# Patient Record
Sex: Male | Born: 1970 | Race: White | Hispanic: No | Marital: Married | Smoking: Current every day smoker
Health system: Southern US, Community
[De-identification: ages and names within clinical notes are randomized; demographics above are authoritative.]

## PROBLEM LIST (undated history)

## (undated) DIAGNOSIS — F419 Anxiety disorder, unspecified: Secondary | ICD-10-CM

## (undated) DIAGNOSIS — J439 Emphysema, unspecified: Secondary | ICD-10-CM

## (undated) DIAGNOSIS — I700 Atherosclerosis of aorta: Secondary | ICD-10-CM

## (undated) DIAGNOSIS — Z8616 Personal history of COVID-19: Secondary | ICD-10-CM

## (undated) DIAGNOSIS — J42 Unspecified chronic bronchitis: Secondary | ICD-10-CM

## (undated) DIAGNOSIS — J302 Other seasonal allergic rhinitis: Secondary | ICD-10-CM

## (undated) DIAGNOSIS — F32A Depression, unspecified: Secondary | ICD-10-CM

## (undated) DIAGNOSIS — G4700 Insomnia, unspecified: Secondary | ICD-10-CM

## (undated) DIAGNOSIS — E871 Hypo-osmolality and hyponatremia: Secondary | ICD-10-CM

## (undated) DIAGNOSIS — J189 Pneumonia, unspecified organism: Secondary | ICD-10-CM

## (undated) DIAGNOSIS — R7989 Other specified abnormal findings of blood chemistry: Secondary | ICD-10-CM

## (undated) DIAGNOSIS — F10931 Alcohol use, unspecified with withdrawal delirium: Secondary | ICD-10-CM

## (undated) DIAGNOSIS — D649 Anemia, unspecified: Secondary | ICD-10-CM

## (undated) DIAGNOSIS — R569 Unspecified convulsions: Secondary | ICD-10-CM

## (undated) DIAGNOSIS — F17200 Nicotine dependence, unspecified, uncomplicated: Secondary | ICD-10-CM

## (undated) DIAGNOSIS — F10231 Alcohol dependence with withdrawal delirium: Secondary | ICD-10-CM

## (undated) DIAGNOSIS — R030 Elevated blood-pressure reading, without diagnosis of hypertension: Secondary | ICD-10-CM

## (undated) DIAGNOSIS — F1010 Alcohol abuse, uncomplicated: Secondary | ICD-10-CM

## (undated) DIAGNOSIS — F329 Major depressive disorder, single episode, unspecified: Secondary | ICD-10-CM

## (undated) HISTORY — PX: OTHER SURGICAL HISTORY: SHX169

## (undated) HISTORY — DX: Atherosclerosis of aorta: I70.0

## (undated) HISTORY — DX: Other seasonal allergic rhinitis: J30.2

## (undated) HISTORY — DX: Anemia, unspecified: D64.9

## (undated) HISTORY — DX: Emphysema, unspecified: J43.9

## (undated) HISTORY — DX: Nicotine dependence, unspecified, uncomplicated: F17.200

## (undated) HISTORY — DX: Insomnia, unspecified: G47.00

## (undated) HISTORY — DX: Personal history of COVID-19: Z86.16

## (undated) HISTORY — DX: Unspecified chronic bronchitis: J42

## (undated) HISTORY — DX: Elevated blood-pressure reading, without diagnosis of hypertension: R03.0

## (undated) HISTORY — DX: Other specified abnormal findings of blood chemistry: R79.89

## (undated) HISTORY — DX: Hypo-osmolality and hyponatremia: E87.1

## (undated) HISTORY — PX: MOUTH SURGERY: SHX715

---

## 2012-10-31 ENCOUNTER — Emergency Department (HOSPITAL_COMMUNITY)
Admission: EM | Admit: 2012-10-31 | Discharge: 2012-11-01 | Disposition: A | Discharge status: Home / Self Care | Payer: Medicaid Other | Attending: Emergency Medicine | Admitting: Emergency Medicine

## 2012-10-31 ENCOUNTER — Encounter (HOSPITAL_COMMUNITY): Payer: Self-pay | Admitting: Emergency Medicine

## 2012-10-31 DIAGNOSIS — R000 Tachycardia, unspecified: Secondary | ICD-10-CM | POA: Insufficient documentation

## 2012-10-31 DIAGNOSIS — F1010 Alcohol abuse, uncomplicated: Secondary | ICD-10-CM | POA: Insufficient documentation

## 2012-10-31 DIAGNOSIS — F17200 Nicotine dependence, unspecified, uncomplicated: Secondary | ICD-10-CM | POA: Insufficient documentation

## 2012-10-31 HISTORY — DX: Alcohol abuse, uncomplicated: F10.10

## 2012-10-31 LAB — COMPREHENSIVE METABOLIC PANEL
ALT: 25 U/L (ref 0–53)
AST: 32 U/L (ref 0–37)
Albumin: 3.6 g/dL (ref 3.5–5.2)
CO2: 22 mEq/L (ref 19–32)
Chloride: 99 mEq/L (ref 96–112)
Creatinine, Ser: 0.75 mg/dL (ref 0.50–1.35)
GFR calc non Af Amer: >90 mL/min (ref >90)
Potassium: 3.6 mEq/L (ref 3.5–5.1)
Sodium: 141 mEq/L (ref 135–145)
Total Bilirubin: 0.3 mg/dL (ref 0.3–1.2)
Total Protein: 7.2 g/dL (ref 6.0–8.3)

## 2012-10-31 LAB — CBC
MCV: 100 fL (ref 78.0–100.0)
Platelets: 238 10*3/uL (ref 150–400)
RBC: 4.55 MIL/uL (ref 4.22–5.81)
RDW: 12.6 % (ref 11.5–15.5)
WBC: 19.2 10*3/uL — ABNORMAL HIGH (ref 4.0–10.5)

## 2012-10-31 LAB — RAPID URINE DRUG SCREEN, HOSP PERFORMED
Amphetamines: NOT DETECTED
Benzodiazepines: NOT DETECTED
Tetrahydrocannabinol: NOT DETECTED

## 2012-10-31 LAB — ACETAMINOPHEN LEVEL: Acetaminophen (Tylenol), Serum: <15 ug/mL (ref 10–30)

## 2012-10-31 LAB — SALICYLATE LEVEL: Salicylate Lvl: <2 mg/dL — ABNORMAL LOW (ref 2.8–20.0)

## 2012-10-31 MED ORDER — VITAMIN B-1 100 MG PO TABS
100.0000 mg | ORAL_TABLET | Freq: Every day | ORAL | Status: DC
  Filled 2012-10-31: qty 1

## 2012-10-31 MED ORDER — FOLIC ACID 1 MG PO TABS
1.0000 mg | ORAL_TABLET | Freq: Every day | ORAL | Status: DC
  Administered 2012-10-31: 1 mg via ORAL
  Filled 2012-10-31: qty 1

## 2012-10-31 MED ORDER — ADULT MULTIVITAMIN W/MINERALS CH
1.0000 | ORAL_TABLET | Freq: Every day | ORAL | Status: DC
  Administered 2012-10-31: 1 via ORAL
  Filled 2012-10-31: qty 1

## 2012-10-31 MED ORDER — LORAZEPAM 1 MG PO TABS
1.0000 mg | ORAL_TABLET | Freq: Four times a day (QID) | ORAL | Status: DC | PRN
  Administered 2012-11-01: 1 mg via ORAL
  Filled 2012-10-31: qty 1

## 2012-10-31 MED ORDER — SODIUM CHLORIDE 0.9 % IV BOLUS (SEPSIS): 1000.0000 mL | Freq: Once | INTRAVENOUS | Status: DC

## 2012-10-31 MED ORDER — NICOTINE 21 MG/24HR TD PT24
21.0000 mg | MEDICATED_PATCH | Freq: Every day | TRANSDERMAL | Status: DC
  Administered 2012-10-31: 21 mg via TRANSDERMAL
  Filled 2012-10-31: qty 1

## 2012-10-31 MED ORDER — LORAZEPAM 2 MG/ML IJ SOLN: 1.0000 mg | Freq: Four times a day (QID) | INTRAMUSCULAR | Status: DC | PRN

## 2012-10-31 MED ORDER — THIAMINE HCL 100 MG/ML IJ SOLN
100.0000 mg | Freq: Every day | INTRAMUSCULAR | Status: DC
  Filled 2012-10-31: qty 2

## 2012-10-31 NOTE — ED Notes (Addendum)
Pt is IVC'd and brought in by sheriff's deputy. Pt lost job and has been drinking 'non stop' per family for the last several weeks. They are concerned because of mental and physical decline. Becoming more aggressive with wife. Found sleeping in car this morning. Pt calm and cooperative at this time. Denies SI/HI.

## 2012-10-31 NOTE — ED Notes (Signed)
PO meds and patch not listed in [employer] in Pharmacy made aware of issue and meds to be sent to ED

## 2012-10-31 NOTE — ED Notes (Signed)
Patient refusing PIV placement  POC reviewed with patient--patient v/u but states "I don't want it."

## 2012-10-31 NOTE — ED Provider Notes (Signed)
CSN: [id number]     Arrival date & time 10/31/12  1614 History  This chart was scribed for non-physician practitioner Arthor Captain, PA-C working with Juliet Rude. Rubin Payor, MD by Lewanda Rife ED Scribe. This patient was seen in room [facility]1/[facility]1 and the patient's care was started at 6:06 PM .    Chief Complaint  Patient presents with  . Medical Clearance   The history is provided by the patient. No language interpreter was used.   HPI Comments: Allen Wood is a 42 y.o. male who presents to the Emergency Department IVC'd by his sister due to "drinking problem". Reports drinking 1 pint to 1/5 of liquor a day. Denies SI and HI at this time. Denies auditory and visual hallucinations.   Past Medical History  Diagnosis Date  . Alcohol abuse    Past Surgical History  Procedure Laterality Date  . Mouth surgery      teeth removed.   No family history on file. History  Substance Use Topics  . Smoking status: Current Every Day Smoker -- 1.50 packs/day  . Smokeless tobacco: Not on file  . Alcohol Use: Yes     Comment: pint- fifth of vodka a day    Review of Systems  Psychiatric/Behavioral: Negative.  Negative for hallucinations.  All other systems reviewed and are negative.  A complete 10 system review of systems was obtained and all systems are negative except as noted in the HPI and PMHx.   Allergies  Review of patient's allergies indicates no known allergies.  Home Medications  No current outpatient prescriptions on file.  Triage Vitals:BP 128/88  Pulse 120  Temp(Src) 98.8 F (37.1 C) (Oral)  SpO2 95%  Physical Exam  Nursing note and vitals reviewed. Constitutional: He is oriented to person, place, and time. He appears well-developed and well-nourished. No distress.  HENT:  Head: Normocephalic and atraumatic.  Eyes: EOM are normal.  Neck: Neck supple. No tracheal deviation present.  Cardiovascular: Regular rhythm.  Tachycardia present.   Pulmonary/Chest: Effort  normal. No respiratory distress.  Musculoskeletal: Normal range of motion.  Neurological: He is alert and oriented to person, place, and time. He displays no tremor.  Clinically sober  Skin: Skin is warm and dry.  Rutty complexion  Psychiatric: He has a normal mood and affect. His behavior is normal. He expresses no homicidal and no suicidal ideation.    ED Course  Procedures  DIAGNOSTIC STUDIES: Oxygen Saturation is 95% on RA, adequate by my interpretation.   Medications  sodium chloride 0.9 % bolus 1,000 mL (not administered)  LORazepam (ATIVAN) tablet 1 mg (not administered)    Or  LORazepam (ATIVAN) injection 1 mg (not administered)  thiamine (VITAMIN B-1) tablet 100 mg (not administered)    Or  thiamine (B-1) injection 100 mg (not administered)  folic acid (FOLVITE) tablet 1 mg (not administered)  multivitamin with minerals tablet 1 tablet (not administered)  nicotine (NICODERM CQ - dosed in mg/24 hours) patch 21 mg (not administered)     COORDINATION OF CARE: 6:10 PM-Discussed treatment plan which includes Ethanol, salicylate level, CMP, CBC, acetaminophen level, and UDS with pt at bedside and pt agreed to plan.    6:25 PM Nursing Notes Reviewed/ Care Coordinated  Interpretation of Laboratory Data incorporated into ED treatment Discussed results and treatment plan with pt. Pt demonstrates understanding and agrees with plan.  Labs Review Labs Reviewed  CBC - Abnormal; Notable for the following:    WBC 19.2 (*)    MCH 35.6 (*)  All other components within normal limits  ETHANOL - Abnormal; Notable for the following:    Alcohol, Ethyl (B) 337 (*)    All other components within normal limits  SALICYLATE LEVEL - Abnormal; Notable for the following:    Salicylate Lvl <2.0 (*)    All other components within normal limits  ACETAMINOPHEN LEVEL  COMPREHENSIVE METABOLIC PANEL  URINE RAPID DRUG SCREEN (HOSP PERFORMED)   Imaging Review No results found.  MDM   1.  Alcohol abuse    Patient with etoh abuse. Will consult to TTS after etoh levels normalize.   I personally performed the services described in this documentation, which was scribed in my presence. The recorded information has been reviewed and is accurate.     Arthor Captain, PA-C 11/12/12 1756

## 2012-10-31 NOTE — ED Notes (Signed)
Pt belongings bag has blue jean shorts, white socks, white t-shirt, grayish strip boxers, light brown loafers. Pt belongings are at the nurses station.

## 2012-10-31 NOTE — ED Notes (Signed)
Pt was valuables locked in security:  brown wallet, EBT card, [place] driver licence, social security card, [place] vehicle registration card, (2) twenty dollar bill, Gold wedding band, four keys, 2.00 dollars worth of quarters, .30 cents worth of nickels, 1.00 dollars worth of dimes, .34 cents worth of pennies.

## 2012-11-01 NOTE — ED Provider Notes (Signed)
8:02 AM Patient assessment Allen Wood of [facility]. The patient does not wish inpatient alcohol treatment and he has not homicidal or suicidal at this time.  Hanley Seamen, MD 11/01/12 [phone]

## 2012-11-01 NOTE — ED Notes (Signed)
Pt denies SI/HI/AVH.  Pt states that he drinks a pint a day and has been doing so for "a long time."  Pt states that he has never been to a formal detox, however he has been able to quit on his own in the past.  Pt denies any history of seizures.

## 2012-11-01 NOTE — BH Assessment (Signed)
Assessment Note  Allen Wood is a 42 y.o. male who presents via IVC initiated by sister. Pt denies SI/HI/Psych.  Per pt.'s family, pt has been drinking consistently for the last several weeks.  They are concerned that he is declining mentally and physically and he is verbally aggressive with his spouse.  Pt consumes 1-2 pints of alcohol, daily, last drink was 10/31/12, pt drank less than a pint.  Pt has not had any previous detox treatment--"I detoxed on my own before".  Pt denies any current w/d sxs.  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  This Clinical research associate discussed disposition with Dr. Read Drivers; agreed to rescind IVC and d/c pt.   Axis I: Alcohol Dependence  Axis II: Deferred Axis III:  Past Medical History  Diagnosis Date  . Alcohol abuse    Axis IV: other psychosocial or environmental problems, problems related to social environment and problems with primary support group Axis V: 51-60 moderate symptoms  Past Medical History:  Past Medical History  Diagnosis Date  . Alcohol abuse     Past Surgical History  Procedure Laterality Date  . Mouth surgery      teeth removed.    Family History: No family history on file.  Social History:  reports that he has been smoking.  He does not have any smokeless tobacco history on file. He reports that  drinks alcohol. He reports that he does not use illicit drugs.  Additional Social History:  Alcohol / Drug Use Pain Medications: None  Prescriptions: None  Over the Counter: None  History of alcohol / drug use?: Yes Longest period of sobriety (when/how long): "I have detox on my own before" Negative Consequences of Use: Personal relationships;Work / Hospital doctor Withdrawal Symptoms: Other (Comment) (no current w/d sxs ) Substance #1 Name of Substance 1: Alcohol  1 - Age of First Use: Teens  1 - Amount (size/oz): 1-2 Pints  1 - Frequency: Daily  1 - Duration:  On-going  1 - Last Use / Amount: 10/31/12  CIWA: CIWA-Ar BP: 159/91 mmHg Pulse Rate: 99 Nausea and Vomiting: no nausea and no vomiting Tactile Disturbances: none Tremor: no tremor Auditory Disturbances: not present Paroxysmal Sweats: no sweat visible Visual Disturbances: not present Anxiety: mildly anxious Headache, Fullness in Head: none present Agitation: normal activity Orientation and Clouding of Sensorium: oriented and can do serial additions CIWA-Ar Total: 1 COWS:    Allergies: No Known Allergies  Home Medications:  (Not in a hospital admission)  OB/GYN Status:  No LMP for male patient.  General Assessment Data Location of Assessment: [facility] Is this a Tele or Face-to-Face Assessment?: Tele Assessment Is this an Initial Assessment or a Re-assessment for this encounter?: Initial Assessment Living Arrangements: Spouse/significant other Can pt return to current living arrangement?: Yes Admission Status: Voluntary Is patient capable of signing voluntary admission?: Yes Transfer from: Acute Hospital Referral Source: MD  Medical Screening Exam [facility] Walk-in ONLY) Medical Exam completed: No Reason for MSE not completed: Other: (None )  [facility] Crisis Care Plan Living Arrangements: Spouse/significant other Name of Psychiatrist: None  Name of Therapist: None   Education Status Is patient currently in school?: No Current Grade: None  Highest grade of school patient has completed: None  Name of school: None  Contact person: None   Risk to self Suicidal Ideation: No Suicidal Intent: No Is patient at risk for suicide?: No Suicidal Plan?: No Access  to Means: No What has been your use of drugs/alcohol within the last 12 months?: Abusing: alcohol  Previous Attempts/Gestures: No How many times?: 0 Other Self Harm Risks: None  Triggers for Past Attempts: None known Intentional Self Injurious Behavior: None Family Suicide History: No Recent stressful life event(s): Other  (Comment) (Chronic SA) Persecutory voices/beliefs?: No Depression: Yes Depression Symptoms: Loss of interest in usual pleasures Substance abuse history and/or treatment for substance abuse?: Yes Suicide prevention information given to non-admitted patients: Not applicable  Risk to Others Homicidal Ideation: No Thoughts of Harm to Others: No Current Homicidal Intent: No Current Homicidal Plan: No Access to Homicidal Means: No Identified Victim: None  History of harm to others?: No Assessment of Violence: None Noted Violent Behavior Description: None  Does patient have access to weapons?: No Criminal Charges Pending?: No Does patient have a court date: No  Psychosis Hallucinations: None noted Delusions: None noted  Mental Status Report Appear/Hygiene: Disheveled Eye Contact: Good Motor Activity: Unremarkable Speech: Logical/coherent Level of Consciousness: Alert Mood: Other (Comment) (Appropriate ) Affect: Appropriate to circumstance Anxiety Level: None Thought Processes: Coherent;Relevant Judgement: Unimpaired Orientation: Person;Place;Time;Situation Obsessive Compulsive Thoughts/Behaviors: None  Cognitive Functioning Concentration: Normal Memory: Recent Intact;Remote Intact IQ: Average Insight: Fair Impulse Control: Fair Appetite: Good Weight Loss: 0 Weight Gain: 0 Sleep: No Change Total Hours of Sleep: 6 Vegetative Symptoms: None  ADLScreening [facility]) Patient's cognitive ability adequate to safely complete daily activities?: Yes Patient able to express need for assistance with ADLs?: Yes Independently performs ADLs?: Yes (appropriate for developmental age)  Prior Inpatient Therapy Prior Inpatient Therapy: No Prior Therapy Dates: None  Prior Therapy Facilty/Provider(s): None  Reason for Treatment: None   Prior Outpatient Therapy Prior Outpatient Therapy: No Prior Therapy Dates: None  Prior Therapy Facilty/Provider(s): None  Reason  for Treatment: None   ADL Screening (condition at time of admission) Patient's cognitive ability adequate to safely complete daily activities?: Yes Is the patient deaf or have difficulty hearing?: No Does the patient have difficulty seeing, even when wearing glasses/contacts?: No Does the patient have difficulty concentrating, remembering, or making decisions?: No Patient able to express need for assistance with ADLs?: Yes Does the patient have difficulty dressing or bathing?: No Independently performs ADLs?: Yes (appropriate for developmental age) Does the patient have difficulty walking or climbing stairs?: No Weakness of Legs: None Weakness of Arms/Hands: None  Home Assistive Devices/Equipment Home Assistive Devices/Equipment: None  Therapy Consults (therapy consults require a physician order) PT Evaluation Needed: No OT Evalulation Needed: No SLP Evaluation Needed: No Abuse/Neglect Assessment (Assessment to be complete while patient is alone) Physical Abuse: Denies Verbal Abuse: Denies Sexual Abuse: Denies Exploitation of patient/patient's resources: Denies Self-Neglect: Denies Values / Beliefs Cultural Requests During Hospitalization: None Spiritual Requests During Hospitalization: None Consults Spiritual Care Consult Needed: No Social Work Consult Needed: No Merchant navy officer (For Healthcare) Advance Directive: Patient does not have advance directive;Patient would not like information Pre-existing out of facility DNR order (yellow form or pink MOST form): No Nutrition Screen- [facility] Patient's home diet: Regular  Additional Information 1:1 In Past 12 Months?: No CIRT Risk: No Elopement Risk: No Does patient have medical clearance?: Yes     Disposition:  Disposition Initial Assessment Completed for this Encounter: Yes Disposition of Patient: Other dispositions (Pt refused tx) Other disposition(s): Information only (Refused tx ) Patient referred to:  Other (Comment) (Pt refused tx )  On Site Evaluation by:   Reviewed with Physician:    Murrell Redden  11/01/2012 7:23 AM

## 2012-11-01 NOTE — ED Notes (Signed)
IVC has been reversed by Dr. Read Drivers.

## 2012-11-01 NOTE — Progress Notes (Signed)
[facility] did not get to see patient but will be sending information about the [facility] Orange Card program, using the address provided.  °

## 2012-11-13 NOTE — ED Provider Notes (Signed)
Medical screening examination/treatment/procedure(s) were performed by non-physician practitioner and as supervising physician I was immediately available for consultation/collaboration.  Juliet Rude. Rubin Payor, MD 11/13/12 [phone]

## 2013-09-14 ENCOUNTER — Inpatient Hospital Stay (HOSPITAL_COMMUNITY)
Admission: EM | Admit: 2013-09-14 | Discharge: 2013-09-15 | DRG: 195 | Disposition: A | Discharge status: Home / Self Care | Payer: Medicaid Other | Attending: Internal Medicine | Admitting: Internal Medicine

## 2013-09-14 ENCOUNTER — Emergency Department (HOSPITAL_COMMUNITY): Payer: Medicaid Other

## 2013-09-14 ENCOUNTER — Encounter (HOSPITAL_COMMUNITY): Payer: Self-pay | Admitting: Emergency Medicine

## 2013-09-14 DIAGNOSIS — Z7982 Long term (current) use of aspirin: Secondary | ICD-10-CM | POA: Diagnosis not present

## 2013-09-14 DIAGNOSIS — F1020 Alcohol dependence, uncomplicated: Secondary | ICD-10-CM | POA: Diagnosis present

## 2013-09-14 DIAGNOSIS — F17200 Nicotine dependence, unspecified, uncomplicated: Secondary | ICD-10-CM | POA: Diagnosis present

## 2013-09-14 DIAGNOSIS — J189 Pneumonia, unspecified organism: Secondary | ICD-10-CM | POA: Diagnosis present

## 2013-09-14 DIAGNOSIS — E872 Acidosis, unspecified: Secondary | ICD-10-CM

## 2013-09-14 DIAGNOSIS — Z720 Tobacco use: Secondary | ICD-10-CM | POA: Diagnosis present

## 2013-09-14 DIAGNOSIS — R630 Anorexia: Secondary | ICD-10-CM | POA: Diagnosis present

## 2013-09-14 LAB — COMPREHENSIVE METABOLIC PANEL
ALBUMIN: 3.2 g/dL — AB (ref 3.5–5.2)
ALT: 28 U/L (ref 0–53)
ANION GAP: 20 — AB (ref 5–15)
AST: 47 U/L — ABNORMAL HIGH (ref 0–37)
Alkaline Phosphatase: 58 U/L (ref 39–117)
BILIRUBIN TOTAL: 0.4 mg/dL (ref 0.3–1.2)
BUN: 8 mg/dL (ref 6–23)
CO2: 24 meq/L (ref 19–32)
CREATININE: 0.64 mg/dL (ref 0.50–1.35)
Calcium: 9 mg/dL (ref 8.4–10.5)
Chloride: 97 mEq/L (ref 96–112)
GFR calc Af Amer: >90 mL/min (ref >90)
Glucose, Bld: 93 mg/dL (ref 70–99)
Potassium: 3.4 mEq/L — ABNORMAL LOW (ref 3.7–5.3)
Sodium: 141 mEq/L (ref 137–147)
Total Protein: 7 g/dL (ref 6.0–8.3)

## 2013-09-14 LAB — CBC
HCT: 35.3 % — ABNORMAL LOW (ref 39.0–52.0)
Hemoglobin: 12.6 g/dL — ABNORMAL LOW (ref 13.0–17.0)
MCH: 34.9 pg — AB (ref 26.0–34.0)
MCHC: 35.7 g/dL (ref 30.0–36.0)
MCV: 97.8 fL (ref 78.0–100.0)
Platelets: 305 10*3/uL (ref 150–400)
RBC: 3.61 MIL/uL — ABNORMAL LOW (ref 4.22–5.81)
RDW: 12.9 % (ref 11.5–15.5)
WBC: 10 10*3/uL (ref 4.0–10.5)

## 2013-09-14 LAB — CBC WITH DIFFERENTIAL/PLATELET
BASOS PCT: 1 % (ref 0–1)
Basophils Absolute: 0 10*3/uL (ref 0.0–0.1)
Eosinophils Absolute: 0 10*3/uL (ref 0.0–0.7)
Eosinophils Relative: 0 % (ref 0–5)
HCT: 42 % (ref 39.0–52.0)
Hemoglobin: 14.9 g/dL (ref 13.0–17.0)
Lymphocytes Relative: 23 % (ref 12–46)
Lymphs Abs: 1.4 10*3/uL (ref 0.7–4.0)
MCH: 34.5 pg — ABNORMAL HIGH (ref 26.0–34.0)
MCHC: 35.5 g/dL (ref 30.0–36.0)
MCV: 97.2 fL (ref 78.0–100.0)
MONO ABS: 0.6 10*3/uL (ref 0.1–1.0)
Monocytes Relative: 10 % (ref 3–12)
Neutro Abs: 4.1 10*3/uL (ref 1.7–7.7)
Neutrophils Relative %: 66 % (ref 43–77)
Platelets: 351 10*3/uL (ref 150–400)
RBC: 4.32 MIL/uL (ref 4.22–5.81)
RDW: 13 % (ref 11.5–15.5)
WBC: 6.1 10*3/uL (ref 4.0–10.5)

## 2013-09-14 LAB — CREATININE, SERUM
Creatinine, Ser: 0.65 mg/dL (ref 0.50–1.35)
GFR calc Af Amer: >90 mL/min (ref >90)
GFR calc non Af Amer: >90 mL/min (ref >90)

## 2013-09-14 LAB — URINALYSIS, ROUTINE W REFLEX MICROSCOPIC
Glucose, UA: NEGATIVE mg/dL
HGB URINE DIPSTICK: NEGATIVE
Ketones, ur: NEGATIVE mg/dL
NITRITE: NEGATIVE
Protein, ur: 100 mg/dL — AB
Specific Gravity, Urine: 1.027 (ref 1.005–1.030)
UROBILINOGEN UA: 1 mg/dL (ref 0.0–1.0)
pH: 6.5 (ref 5.0–8.0)

## 2013-09-14 LAB — MAGNESIUM: Magnesium: 0.9 mg/dL — CL (ref 1.5–2.5)

## 2013-09-14 LAB — URINE MICROSCOPIC-ADD ON

## 2013-09-14 LAB — CK: Total CK: 123 U/L (ref 7–232)

## 2013-09-14 LAB — LACTIC ACID, PLASMA: Lactic Acid, Venous: 3.6 mmol/L — ABNORMAL HIGH (ref 0.5–2.2)

## 2013-09-14 LAB — I-STAT CG4 LACTIC ACID, ED: LACTIC ACID, VENOUS: 5.21 mmol/L — AB (ref 0.5–2.2)

## 2013-09-14 LAB — PRO B NATRIURETIC PEPTIDE: Pro B Natriuretic peptide (BNP): 11.9 pg/mL (ref 0–125)

## 2013-09-14 MED ORDER — LORAZEPAM 1 MG PO TABS
0.0000 mg | ORAL_TABLET | Freq: Four times a day (QID) | ORAL | Status: DC
  Administered 2013-09-14 – 2013-09-15 (×3): 2 mg via ORAL
  Filled 2013-09-14 (×3): qty 2

## 2013-09-14 MED ORDER — CEFTRIAXONE SODIUM 1 G IJ SOLR
1.0000 g | Freq: Once | INTRAMUSCULAR | Status: AC
  Administered 2013-09-14: 1 g via INTRAVENOUS
  Filled 2013-09-14: qty 10

## 2013-09-14 MED ORDER — ONDANSETRON HCL 4 MG/2ML IJ SOLN
4.0000 mg | Freq: Four times a day (QID) | INTRAMUSCULAR | Status: DC | PRN
Start: 2013-09-14 — End: 2013-09-15

## 2013-09-14 MED ORDER — MAGNESIUM SULFATE 4000MG/100ML IJ SOLN
4.0000 g | Freq: Once | INTRAMUSCULAR | Status: AC
  Administered 2013-09-14: 4 g via INTRAVENOUS
  Filled 2013-09-14: qty 100

## 2013-09-14 MED ORDER — SODIUM CHLORIDE 0.9 % IV BOLUS (SEPSIS)
1000.0000 mL | Freq: Once | INTRAVENOUS | Status: AC
  Administered 2013-09-14: 1000 mL via INTRAVENOUS

## 2013-09-14 MED ORDER — DEXTROSE 5 % IV SOLN
500.0000 mg | INTRAVENOUS | Status: DC
  Filled 2013-09-14: qty 500

## 2013-09-14 MED ORDER — VITAMIN B-1 100 MG PO TABS
100.0000 mg | ORAL_TABLET | Freq: Every day | ORAL | Status: DC
  Filled 2013-09-14: qty 1

## 2013-09-14 MED ORDER — HEPARIN SODIUM (PORCINE) 5000 UNIT/ML IJ SOLN
5000.0000 [IU] | Freq: Three times a day (TID) | INTRAMUSCULAR | Status: DC
  Administered 2013-09-14 – 2013-09-15 (×2): 5000 [IU] via SUBCUTANEOUS
  Filled 2013-09-14 (×5): qty 1

## 2013-09-14 MED ORDER — ALBUTEROL SULFATE (2.5 MG/3ML) 0.083% IN NEBU: 2.5000 mg | INHALATION_SOLUTION | RESPIRATORY_TRACT | Status: AC | PRN

## 2013-09-14 MED ORDER — POLYETHYLENE GLYCOL 3350 17 G PO PACK
17.0000 g | PACK | Freq: Every day | ORAL | Status: DC | PRN
  Filled 2013-09-14: qty 1

## 2013-09-14 MED ORDER — SODIUM CHLORIDE 0.9 % IJ SOLN
3.0000 mL | Freq: Two times a day (BID) | INTRAMUSCULAR | Status: DC
  Administered 2013-09-14: 3 mL via INTRAVENOUS

## 2013-09-14 MED ORDER — NICOTINE 21 MG/24HR TD PT24
21.0000 mg | MEDICATED_PATCH | Freq: Every day | TRANSDERMAL | Status: DC
  Administered 2013-09-14: 21 mg via TRANSDERMAL
  Filled 2013-09-14 (×2): qty 1

## 2013-09-14 MED ORDER — AZITHROMYCIN 500 MG IV SOLR
500.0000 mg | Freq: Once | INTRAVENOUS | Status: AC
  Administered 2013-09-14: 500 mg via INTRAVENOUS
  Filled 2013-09-14: qty 500

## 2013-09-14 MED ORDER — ONDANSETRON HCL 4 MG PO TABS: 4.0000 mg | ORAL_TABLET | Freq: Four times a day (QID) | ORAL | Status: DC | PRN

## 2013-09-14 MED ORDER — PREDNISONE 20 MG PO TABS
60.0000 mg | ORAL_TABLET | ORAL | Status: AC
  Administered 2013-09-14: 60 mg via ORAL
  Filled 2013-09-14: qty 3

## 2013-09-14 MED ORDER — ADULT MULTIVITAMIN W/MINERALS CH
1.0000 | ORAL_TABLET | Freq: Every day | ORAL | Status: DC
  Filled 2013-09-14: qty 1

## 2013-09-14 MED ORDER — THIAMINE HCL 100 MG/ML IJ SOLN
100.0000 mg | Freq: Every day | INTRAMUSCULAR | Status: DC
  Filled 2013-09-14: qty 1

## 2013-09-14 MED ORDER — LORAZEPAM 2 MG/ML IJ SOLN
1.0000 mg | Freq: Four times a day (QID) | INTRAMUSCULAR | Status: DC | PRN
  Administered 2013-09-14: 1 mg via INTRAVENOUS
  Filled 2013-09-14: qty 1

## 2013-09-14 MED ORDER — ALBUTEROL SULFATE (2.5 MG/3ML) 0.083% IN NEBU
5.0000 mg | INHALATION_SOLUTION | Freq: Once | RESPIRATORY_TRACT | Status: AC
Start: 2013-09-14 — End: 2013-09-14
  Administered 2013-09-14: 5 mg via RESPIRATORY_TRACT
  Filled 2013-09-14: qty 6

## 2013-09-14 MED ORDER — NICOTINE 14 MG/24HR TD PT24
14.0000 mg | MEDICATED_PATCH | Freq: Once | TRANSDERMAL | Status: AC
  Administered 2013-09-14: 14 mg via TRANSDERMAL
  Filled 2013-09-14: qty 1

## 2013-09-14 MED ORDER — SODIUM CHLORIDE 0.9 % IV SOLN: 250.0000 mL | INTRAVENOUS | Status: DC | PRN

## 2013-09-14 MED ORDER — ASPIRIN 325 MG PO TABS
325.0000 mg | ORAL_TABLET | Freq: Every day | ORAL | Status: DC
  Administered 2013-09-14: 325 mg via ORAL
  Filled 2013-09-14 (×2): qty 1

## 2013-09-14 MED ORDER — THIAMINE HCL 100 MG/ML IJ SOLN
Freq: Once | INTRAVENOUS | Status: AC
  Administered 2013-09-14: 18:00:00 via INTRAVENOUS
  Filled 2013-09-14: qty 1000

## 2013-09-14 MED ORDER — LORAZEPAM 1 MG PO TABS: 1.0000 mg | ORAL_TABLET | Freq: Four times a day (QID) | ORAL | Status: DC | PRN

## 2013-09-14 MED ORDER — SODIUM CHLORIDE 0.9 % IV SOLN: INTRAVENOUS | Status: DC

## 2013-09-14 MED ORDER — DEXTROSE 5 % IV SOLN
1.0000 g | INTRAVENOUS | Status: DC
  Filled 2013-09-14: qty 10

## 2013-09-14 MED ORDER — LORAZEPAM 1 MG PO TABS: 0.0000 mg | ORAL_TABLET | Freq: Two times a day (BID) | ORAL | Status: DC

## 2013-09-14 MED ORDER — PANTOPRAZOLE SODIUM 40 MG PO TBEC
40.0000 mg | DELAYED_RELEASE_TABLET | Freq: Every day | ORAL | Status: DC
  Administered 2013-09-14: 40 mg via ORAL
  Filled 2013-09-14 (×2): qty 1

## 2013-09-14 MED ORDER — SODIUM CHLORIDE 0.9 % IJ SOLN: 3.0000 mL | INTRAMUSCULAR | Status: DC | PRN

## 2013-09-14 MED ORDER — FOLIC ACID 1 MG PO TABS
1.0000 mg | ORAL_TABLET | Freq: Every day | ORAL | Status: DC
  Filled 2013-09-14: qty 1

## 2013-09-14 NOTE — Progress Notes (Signed)
UR completed 

## 2013-09-14 NOTE — ED Notes (Signed)
Dr Vanita Panda aware of elevated I stat Lactic.

## 2013-09-14 NOTE — ED Provider Notes (Signed)
CSN: [id number]     Arrival date & time 09/14/13  0865 History   First MD Initiated Contact with Patient 09/14/13 [phone]     Chief Complaint  Patient presents with  . Shortness of Breath  . Fatigue      HPI Patient presents with concern for fatigue, dyspnea. Patient also has anorexia, nausea, no vomiting, diarrhea. Symptoms began one month ago without clear precipitant. Patient denies focal pain. Since onset symptoms have been progressive, with no clear alleviating or exacerbating factors. Patient continues to smoke, possibly 2 packs a day.   Past Medical History  Diagnosis Date  . Alcohol abuse    Past Surgical History  Procedure Laterality Date  . Mouth surgery      teeth removed.   History reviewed. No pertinent family history. History  Substance Use Topics  . Smoking status: Current Every Day Smoker -- 1.00 packs/day  . Smokeless tobacco: Not on file  . Alcohol Use: Yes     Comment: pint- fifth of vodka a day    Review of Systems  Constitutional:       Per HPI, otherwise negative  HENT:       Per HPI, otherwise negative  Respiratory:       Per HPI, otherwise negative  Cardiovascular:       Per HPI, otherwise negative  Gastrointestinal: Negative for vomiting.  Endocrine:       Negative aside from HPI  Genitourinary:       Neg aside from HPI   Musculoskeletal:       Per HPI, otherwise negative  Skin: Negative.   Neurological: Negative for syncope.      Allergies  Review of patient's allergies indicates no known allergies.  Home Medications   Prior to Admission medications   Medication Sig Start Date End Date Taking? Authorizing Provider  aspirin 325 MG tablet Take 325 mg by mouth daily.   Yes Historical Provider, MD  aspirin-sod bicarb-citric acid (ALKA-SELTZER) 325 MG TBEF tablet Take 325 mg by mouth every 6 (six) hours as needed (upset stomach).   Yes Historical Provider, MD   BP 128/91  Pulse 102  Temp(Src) 97.9 F (36.6 C) (Oral)  Resp 17   SpO2 97% Physical Exam  Nursing note and vitals reviewed. Constitutional: He is oriented to person, place, and time. He has a sickly appearance.  HENT:  Head: Normocephalic and atraumatic.  Eyes: Conjunctivae and EOM are normal.  Cardiovascular: Normal rate and regular rhythm.   Pulmonary/Chest: Effort normal. No stridor. No respiratory distress. He has decreased breath sounds. He has wheezes in the right upper field.  Abdominal: He exhibits no distension.  Musculoskeletal: He exhibits no edema.  Neurological: He is alert and oriented to person, place, and time.  Skin: Skin is warm and dry.  Psychiatric: He has a normal mood and affect.    ED Course  Procedures (including critical care time) Labs Review Labs Reviewed  CBC WITH DIFFERENTIAL - Abnormal; Notable for the following:    MCH 34.5 (*)    All other components within normal limits  COMPREHENSIVE METABOLIC PANEL - Abnormal; Notable for the following:    Potassium 3.4 (*)    Albumin 3.2 (*)    AST 47 (*)    Anion gap 20 (*)    All other components within normal limits  URINALYSIS, ROUTINE W REFLEX MICROSCOPIC - Abnormal; Notable for the following:    Color, Urine AMBER (*)    Bilirubin Urine SMALL (*)  Protein, ur 100 (*)    Leukocytes, UA TRACE (*)    All other components within normal limits  LACTIC ACID, PLASMA - Abnormal; Notable for the following:    Lactic Acid, Venous 3.6 (*)    All other components within normal limits  URINE MICROSCOPIC-ADD ON - Abnormal; Notable for the following:    Crystals CA OXALATE CRYSTALS (*)    All other components within normal limits  PRO B NATRIURETIC PEPTIDE  CK    Imaging Review Dg Chest 2 View  09/14/2013   CLINICAL DATA:  Fatigue and shortness of breath. Cough for 1 month. Smoker.  EXAM: CHEST  2 VIEW  COMPARISON:  None.  FINDINGS: The chest is hyperexpanded with attenuation of the pulmonary vasculature consistent with emphysema. A punctate calcified granuloma is seen in  the right upper lobe. The lungs are otherwise clear. Heart size is normal. No pneumothorax or pleural effusion.  IMPRESSION: Findings consistent with emphysema.  No acute process.   Electronically Signed   By: Inge Rise M.D.   On: 09/14/2013 09:45     EKG Interpretation   Date/Time:  Thursday September 14 2013 09:03:21 EDT Ventricular Rate:  89 PR Interval:  118 QRS Duration: 82 QT Interval:  366 QTC Calculation: 445 R Axis:   85 Text Interpretation:  Sinus rhythm Borderline short PR interval  Anteroseptal infarct, age indeterminate Sinus rhythm Artifact q waves  anteriorly Abnormal ekg Confirmed by Carmin Muskrat  MD ([id number]) on  09/14/2013 9:48:16 AM     11:22 AM Patient awake and alert. He now acknowledges drinking about one pint of alcohol daily.  HR remains 95-105 range  I had a lengthy conversation with the patient about smoking cessation, the need to do so.  Update: On exam the patient continues to have dyspnea, and repeat lactic acid is increased. CT scan will be performed.  Update: CT suggests pneumonia.  Given the increased lactic acid, patient will be admitted after initiation of therapeutic.  MDM   Patient presents with increasing symptoms of dyspnea, fatigue, anorexia. Patient's evaluation suggests pneumonia, and the patient has not been hospitalized recently. Patient is awake alert, though he is mildly tachycardic, and given his lactic acidosis, need for fluid resuscitation, patient was admitted for further evaluation and management.  Carmin Muskrat, MD 09/14/13 1438

## 2013-09-14 NOTE — Progress Notes (Signed)
CRITICAL VALUE ALERT  Critical value received:  mag 0.9  Date of notification:  09/14/2013   Time of notification:  6:20 PM   Critical value read back:Yes.    Nurse who received alert:  Joaquin Courts   MD notified (1st page):  Dr Aileen Fass  Time of first page:  6:20 PM   MD notified (2nd page):  Time of second page:  Responding MD:  Aileen Fass Time MD responded:  [phone]

## 2013-09-14 NOTE — ED Notes (Addendum)
Pt c/o intermittent SOB x 1 mo, current episode started about 3 days ago.  Pt also complains of fatigue and mild numbness/tingling in left arm.  Also reports numbness/tingling in arms and hands intermittently over past couple weeks.  Denies hx of heart and lung problems.  Pt reports being a smoker (1pk/day).  Wife reports some confusion.  Pt reports working outdoors and strenuous activity for work. Pt reports decreased appetite and some nausea. Pt denies any current pain.  Reports some stomach discomfort r/t nausea, but no nausea currently. No acute distress, respirations mildly labored, skin warm and dry.

## 2013-09-14 NOTE — ED Notes (Signed)
Pt to xray

## 2013-09-14 NOTE — H&P (Addendum)
[facility] History and Physical  [name] [id number]:[id number] DOB: Apr 14, 1970 DOA: 09/14/2013  Referring physician: Dr. Vanita Panda PCP: No primary provider on file.   Chief Complaint: sob and cough  HPI: Allen Wood is a 43 y.o. male  Past medical history of tobacco abuse that started having productive cough about 2 weeks prior to admission he relates he has been getting worse to the point where he he has some anorexia and dyspnea on exertion. He relates he cannot walk more than one block without being short of breath. He relates no fever, chills nausea or vomiting. But has decreased appetite. No sick contacts or recent travel.  In the ED: Lactic acid was checked which was 3.6 and he was descending on room air as we're consulted for admission.   Review of Systems:  Constitutional:  No weight loss, night sweats, Fevers, chills, fatigue.  HEENT:  No headaches, Difficulty swallowing,Tooth/dental problems,Sore throat,  No sneezing, itching, ear ache, nasal congestion, post nasal drip,  Cardio-vascular:  No chest pain, Orthopnea, PND, swelling in lower extremities, anasarca, dizziness, palpitations  GI:  No heartburn, indigestion, abdominal pain, nausea, vomiting, diarrhea, change in bowel habits, loss of appetite   Skin:  no rash or lesions.  GU:  no dysuria, change in color of urine, no urgency or frequency. No flank pain.  Musculoskeletal:  No joint pain or swelling. No decreased range of motion. No back pain.  Psych:  No change in mood or affect. No depression or anxiety. No memory loss.   Past Medical History  Diagnosis Date  . Alcohol abuse    Past Surgical History  Procedure Laterality Date  . Mouth surgery      teeth removed.   Social History:  reports that he has been smoking.  He does not have any smokeless tobacco history on file. He reports that he drinks alcohol. He reports that he does not use illicit drugs.  No Known Allergies  History reviewed.  No pertinent family history.   Prior to Admission medications   Medication Sig Start Date End Date Taking? Authorizing Provider  aspirin 325 MG tablet Take 325 mg by mouth daily.   Yes Historical Provider, MD  aspirin-sod bicarb-citric acid (ALKA-SELTZER) 325 MG TBEF tablet Take 325 mg by mouth every 6 (six) hours as needed (upset stomach).   Yes Historical Provider, MD   Physical Exam: Filed Vitals:   09/14/13 1300 09/14/13 1330 09/14/13 1353 09/14/13 1400  BP: 138/69 138/74 130/79 127/83  Pulse: 92 90 85 97  Temp:      TempSrc:      Resp:    20  SpO2: 98% 97% 98% 98%    Wt Readings from Last 3 Encounters:  No data found for Wt    General:  Appears calm and comfortable Eyes: PERRL, normal lids, irises & conjunctiva ENT: grossly normal hearing, lips & tongue Neck: no LAD, masses or thyromegaly Cardiovascular: RRR, no m/r/g. No LE edema. Telemetry: SR, no arrhythmias  Respiratory: Moderate air movement crackles on the right, no wheezing Abdomen: soft, ntnd Skin: no rash or induration seen on limited exam Musculoskeletal: grossly normal tone BUE/BLE Psychiatric: grossly normal mood and affect, speech fluent and appropriate Neurologic: grossly non-focal.          Labs on Admission:  Basic Metabolic Panel:  Recent Labs Lab 09/14/13 0919  NA 141  K 3.4*  CL 97  CO2 24  GLUCOSE 93  BUN 8  CREATININE 0.64  CALCIUM 9.0   Liver  Function Tests:  Recent Labs Lab 09/14/13 0919  AST 47*  ALT 28  ALKPHOS 58  BILITOT 0.4  PROT 7.0  ALBUMIN 3.2*   No results found for this basename: LIPASE, AMYLASE,  in the last 168 hours No results found for this basename: AMMONIA,  in the last 168 hours CBC:  Recent Labs Lab 09/14/13 0919  WBC 6.1  NEUTROABS 4.1  HGB 14.9  HCT 42.0  MCV 97.2  PLT 351   Cardiac Enzymes:  Recent Labs Lab 09/14/13 0919  CKTOTAL 123    BNP (last 3 results)  Recent Labs  09/14/13 0920  PROBNP 11.9   CBG: No results found for  this basename: GLUCAP,  in the last 168 hours  Radiological Exams on Admission: Dg Chest 2 View  09/14/2013   CLINICAL DATA:  Fatigue and shortness of breath. Cough for 1 month. Smoker.  EXAM: CHEST  2 VIEW  COMPARISON:  None.  FINDINGS: The chest is hyperexpanded with attenuation of the pulmonary vasculature consistent with emphysema. A punctate calcified granuloma is seen in the right upper lobe. The lungs are otherwise clear. Heart size is normal. No pneumothorax or pleural effusion.  IMPRESSION: Findings consistent with emphysema.  No acute process.   Electronically Signed   By: Inge Rise M.D.   On: 09/14/2013 09:45   Ct Chest Wo Contrast  09/14/2013   CLINICAL DATA:  Lactic acidosis, dyspnea, cough, alcohol abuse  EXAM: CT CHEST WITHOUT CONTRAST  TECHNIQUE: Multidetector CT imaging of the chest was performed following the standard protocol without IV contrast.  COMPARISON:  09/14/2013  FINDINGS: Study is limited without IV contrast. Minor atherosclerotic change of the aorta. No adenopathy. Normal heart size. No pericardial or pleural effusion.  Limited assessment of the upper abdomen no acute finding by noncontrast imaging.  Lung windows demonstrate hyperinflation diffusely. Posterior left lower lobe patchy micronodular airspace disease with scattered tree in bud pattern and a small area of adjacent subpleural consolidation/ nodular atelectasis. Suspect infectious/inflammatory process such as pneumonia or aspiration and adjacent atelectasis. Right lung is clear. Trachea and central airways are patent. No bronchiectasis or bronchial wall thickening. No effusion or pneumothorax.  No acute osseous finding.  IMPRESSION: Posterior medial left lower lobe airspace disease with scattered tree-in-bud pattern and adjacent subpleural small area of consolidation/atelectasis. Suspect pneumonia versus aspiration.   Electronically Signed   By: Daryll Brod M.D.   On: 09/14/2013 14:25    EKG: Independently  reviewed. Sinus rhythm with possible LVH  Assessment/Plan CAP (community acquired pneumonia) - Chest x-ray showed nodule a CT scan was done official results as above, he continues to productive cough mom in the room. She in the emergency room liter of normal saline in the emergency room his lactic acid was 3.5 overlapping the additional liters of normal saline. Agree with Rocephin and azithromycin. - He is currently not wheezing. We'll get sputum cultures. Check an HIV and Legionella. - If his symptoms have been going on for this long this probably walking pneumonia. - His lactic acidosis probably due to hypoxia. At this time satting well.  Nicotine abuse: - Nicotine patch placed.   EtOH dependence - He relates alcohol use about a quarter of liquor a day. His last drink was 13 hours prior to admission. We'll go ahead and start him on a CIWA protocol.    Hypokalemia: - Probably due to decreased intravascular volume, replete check a magnesium level. He's also a heavy alcohol drinker which can make his magnesium  level.  Code Status: full DVT Prophylaxis:heparin Family Communication: none Disposition Plan: inpatient  Time spent: 80 minutes  [name] [facility] Pager [phone]  **Disclaimer: This note may have been dictated with voice recognition software. Similar sounding words can inadvertently be transcribed and this note may contain transcription errors which may not have been corrected upon publication of note.**

## 2013-09-14 NOTE — ED Notes (Signed)
Pt displaying tremors and diaphoresis.  Nurse asked pt about alcohol use and withdrawal.  Withdrawal treatment was discussed, but pt stated he would be okay without it.

## 2013-09-15 LAB — LEGIONELLA ANTIGEN, URINE: Legionella Antigen, Urine: NEGATIVE

## 2013-09-15 LAB — CBC
HCT: 35 % — ABNORMAL LOW (ref 39.0–52.0)
Hemoglobin: 12.1 g/dL — ABNORMAL LOW (ref 13.0–17.0)
MCH: 34.2 pg — ABNORMAL HIGH (ref 26.0–34.0)
MCHC: 34.6 g/dL (ref 30.0–36.0)
MCV: 98.9 fL (ref 78.0–100.0)
PLATELETS: 261 10*3/uL (ref 150–400)
RBC: 3.54 MIL/uL — ABNORMAL LOW (ref 4.22–5.81)
RDW: 12.9 % (ref 11.5–15.5)
WBC: 6.6 10*3/uL (ref 4.0–10.5)

## 2013-09-15 LAB — BASIC METABOLIC PANEL
ANION GAP: 12 (ref 5–15)
BUN: 6 mg/dL (ref 6–23)
CALCIUM: 7.6 mg/dL — AB (ref 8.4–10.5)
CHLORIDE: 100 meq/L (ref 96–112)
CO2: 24 meq/L (ref 19–32)
CREATININE: 0.62 mg/dL (ref 0.50–1.35)
GLUCOSE: 133 mg/dL — AB (ref 70–99)
Potassium: 3.7 mEq/L (ref 3.7–5.3)
Sodium: 136 mEq/L — ABNORMAL LOW (ref 137–147)

## 2013-09-15 LAB — HIV ANTIBODY (ROUTINE TESTING W REFLEX): HIV 1&2 Ab, 4th Generation: NONREACTIVE

## 2013-09-15 LAB — MAGNESIUM: MAGNESIUM: 1.9 mg/dL (ref 1.5–2.5)

## 2013-09-15 MED ORDER — PREDNISONE 10 MG PO TABS: ORAL_TABLET | ORAL | Status: DC

## 2013-09-15 MED ORDER — PREDNISONE 50 MG PO TABS
50.0000 mg | ORAL_TABLET | Freq: Every day | ORAL | Status: DC
  Filled 2013-09-15 (×2): qty 1

## 2013-09-15 MED ORDER — AZITHROMYCIN 500 MG PO TABS: 500.0000 mg | ORAL_TABLET | Freq: Every day | ORAL | Status: DC

## 2013-09-15 MED ORDER — METHYLPREDNISOLONE SODIUM SUCC 125 MG IJ SOLR
80.0000 mg | Freq: Once | INTRAMUSCULAR | Status: AC
  Administered 2013-09-15: 80 mg via INTRAVENOUS
  Filled 2013-09-15: qty 1.28

## 2013-09-15 NOTE — Discharge Instructions (Signed)
Allen Wood was admitted to the Hospital on 09/14/2013 and Discharged on Discharge Date 09/15/2013 and should be excused from work/school   for 2   days starting 09/14/2013 , may return to work/school without any restrictions.  Call Bess Harvest MD, [facility] [phone] with questions.  [name] M.D on 09/15/2013,at 9:20 AM  [facility] Office  [phone]

## 2013-09-15 NOTE — Discharge Summary (Signed)
Physician Discharge Summary  Allen Wood [id number]:[id number] DOB: 09-24-1970 DOA: 09/14/2013  PCP: [facility]  Admit date: 09/14/2013 Discharge date: 09/15/2013  Time spent: 35 minutes  Recommendations for Outpatient Follow-up:  1. Follow up with PCP  Discharge Diagnoses:  Active Problems:   CAP (community acquired pneumonia)   Nicotine abuse   EtOH dependence   Discharge Condition: stable  Diet recommendation: regular  Filed Weights   09/14/13 1627  Weight: 65.726 kg (144 lb 14.4 oz)    History of present illness:  43 y.o. male  Past medical history of tobacco abuse that started having productive cough about 2 weeks prior to admission he relates he has been getting worse to the point where he he has some anorexia and dyspnea on exertion. He relates he cannot walk more than one block without being short of breath. He relates no fever, chills nausea or vomiting. But has decreased appetite. No sick contacts or recent travel.   Hospital Course:  CAP (community acquired pneumonia)  - Chest x-ray showed nodule a CT scan was done showed possible infiltrate. - started on  Rocephin and azithromycin de-escalated to azithro orally for 5 additional days. - started on steroids for 7 days.   Procedures:  CT chest  Consultations:  none  Discharge Exam: Filed Vitals:   09/15/13 0531  BP: 138/94  Pulse: 86  Temp: 98.4 F (36.9 C)  Resp: 18    General: A&O x3 Cardiovascular: RRR Respiratory: good air movement, minimal wheezing.  Discharge Instructions You were cared for by a hospitalist during your hospital stay. If you have any questions about your discharge medications or the care you received while you were in the hospital after you are discharged, you can call the unit and asked to speak with the hospitalist on call if the hospitalist that took care of you is not available. Once you are discharged, your primary care physician will handle any  further medical issues. Please note that NO REFILLS for any discharge medications will be authorized once you are discharged, as it is imperative that you return to your primary care physician (or establish a relationship with a primary care physician if you do not have one) for your aftercare needs so that they can reassess your need for medications and monitor your lab values.      Discharge Instructions   Diet - low sodium heart healthy    Complete by:  As directed      Increase activity slowly    Complete by:  As directed             Medication List         aspirin 325 MG tablet  Take 325 mg by mouth daily.     aspirin-sod bicarb-citric acid 325 MG Tbef tablet  Commonly known as:  ALKA-SELTZER  Take 325 mg by mouth every 6 (six) hours as needed (upset stomach).     azithromycin 500 MG tablet  Commonly known as:  ZITHROMAX  Take 1 tablet (500 mg total) by mouth daily.     predniSONE 10 MG tablet  Commonly known as:  DELTASONE  Takes 6 tablets for 1 days, then 5 tablets for 1 days, then 4 tablets for 1 days, then 3 tablets for 1 days, then 2 tabs for 1 days, then 1 tab for 1 days, and then stop.       No Known Allergies    The results of significant diagnostics from this hospitalization (  including imaging, microbiology, ancillary and laboratory) are listed below for reference.    Significant Diagnostic Studies: Dg Chest 2 View  09/14/2013   CLINICAL DATA:  Fatigue and shortness of breath. Cough for 1 month. Smoker.  EXAM: CHEST  2 VIEW  COMPARISON:  None.  FINDINGS: The chest is hyperexpanded with attenuation of the pulmonary vasculature consistent with emphysema. A punctate calcified granuloma is seen in the right upper lobe. The lungs are otherwise clear. Heart size is normal. No pneumothorax or pleural effusion.  IMPRESSION: Findings consistent with emphysema.  No acute process.   Electronically Signed   By: Inge Rise M.D.   On: 09/14/2013 09:45   Ct Chest Wo  Contrast  09/14/2013   CLINICAL DATA:  Lactic acidosis, dyspnea, cough, alcohol abuse  EXAM: CT CHEST WITHOUT CONTRAST  TECHNIQUE: Multidetector CT imaging of the chest was performed following the standard protocol without IV contrast.  COMPARISON:  09/14/2013  FINDINGS: Study is limited without IV contrast. Minor atherosclerotic change of the aorta. No adenopathy. Normal heart size. No pericardial or pleural effusion.  Limited assessment of the upper abdomen no acute finding by noncontrast imaging.  Lung windows demonstrate hyperinflation diffusely. Posterior left lower lobe patchy micronodular airspace disease with scattered tree in bud pattern and a small area of adjacent subpleural consolidation/ nodular atelectasis. Suspect infectious/inflammatory process such as pneumonia or aspiration and adjacent atelectasis. Right lung is clear. Trachea and central airways are patent. No bronchiectasis or bronchial wall thickening. No effusion or pneumothorax.  No acute osseous finding.  IMPRESSION: Posterior medial left lower lobe airspace disease with scattered tree-in-bud pattern and adjacent subpleural small area of consolidation/atelectasis. Suspect pneumonia versus aspiration.   Electronically Signed   By: Daryll Brod M.D.   On: 09/14/2013 14:25    Microbiology: No results found for this or any previous visit (from the past 240 hour(s)).   Labs: Basic Metabolic Panel:  Recent Labs Lab 09/14/13 0919 09/14/13 1655 09/15/13 0532  NA 141  --  136*  K 3.4*  --  3.7  CL 97  --  100  CO2 24  --  24  GLUCOSE 93  --  133*  BUN 8  --  6  CREATININE 0.64 0.65 0.62  CALCIUM 9.0  --  7.6*  MG  --  0.9* 1.9   Liver Function Tests:  Recent Labs Lab 09/14/13 0919  AST 47*  ALT 28  ALKPHOS 58  BILITOT 0.4  PROT 7.0  ALBUMIN 3.2*   No results found for this basename: LIPASE, AMYLASE,  in the last 168 hours No results found for this basename: AMMONIA,  in the last 168 hours CBC:  Recent  Labs Lab 09/14/13 0919 09/14/13 1655 09/15/13 0532  WBC 6.1 10.0 6.6  NEUTROABS 4.1  --   --   HGB 14.9 12.6* 12.1*  HCT 42.0 35.3* 35.0*  MCV 97.2 97.8 98.9  PLT 351 305 261   Cardiac Enzymes:  Recent Labs Lab 09/14/13 0919  CKTOTAL 123   BNP: BNP (last 3 results)  Recent Labs  09/14/13 0920  PROBNP 11.9   CBG: No results found for this basename: GLUCAP,  in the last 168 hours     Signed:  [name]  [facility] 09/15/2013, 8:35 AM

## 2013-09-15 NOTE — Progress Notes (Signed)
Pt discharged home with wife.  Discharge instructions and prescriptions given to patient and family.  Both verbalized understanding.  Pt escorted to main entrance to awaiting private vehicle.

## 2013-11-03 ENCOUNTER — Other Ambulatory Visit: Payer: Self-pay | Admitting: Family Medicine

## 2013-11-03 ENCOUNTER — Ambulatory Visit
Admission: RE | Admit: 2013-11-03 | Discharge: 2013-11-03 | Disposition: A | Discharge status: Home / Self Care | Payer: Medicaid Other | Source: Ambulatory Visit | Attending: Family Medicine | Admitting: Family Medicine

## 2013-11-03 DIAGNOSIS — J189 Pneumonia, unspecified organism: Secondary | ICD-10-CM

## 2014-01-15 ENCOUNTER — Inpatient Hospital Stay (HOSPITAL_COMMUNITY)
Admission: EM | Admit: 2014-01-15 | Discharge: 2014-02-08 | DRG: 004 | Disposition: A | Discharge status: Rehabilitation Facility | Payer: Medicaid Other | Attending: Pulmonary Disease | Admitting: Pulmonary Disease

## 2014-01-15 ENCOUNTER — Emergency Department (HOSPITAL_COMMUNITY): Payer: Medicaid Other

## 2014-01-15 ENCOUNTER — Encounter (HOSPITAL_COMMUNITY): Payer: Self-pay | Admitting: *Deleted

## 2014-01-15 DIAGNOSIS — F17210 Nicotine dependence, cigarettes, uncomplicated: Secondary | ICD-10-CM | POA: Diagnosis present

## 2014-01-15 DIAGNOSIS — D500 Iron deficiency anemia secondary to blood loss (chronic): Secondary | ICD-10-CM | POA: Diagnosis present

## 2014-01-15 DIAGNOSIS — I10 Essential (primary) hypertension: Secondary | ICD-10-CM | POA: Diagnosis present

## 2014-01-15 DIAGNOSIS — D539 Nutritional anemia, unspecified: Secondary | ICD-10-CM | POA: Diagnosis present

## 2014-01-15 DIAGNOSIS — A419 Sepsis, unspecified organism: Principal | ICD-10-CM

## 2014-01-15 DIAGNOSIS — Z9289 Personal history of other medical treatment: Secondary | ICD-10-CM

## 2014-01-15 DIAGNOSIS — R569 Unspecified convulsions: Secondary | ICD-10-CM | POA: Diagnosis not present

## 2014-01-15 DIAGNOSIS — J9600 Acute respiratory failure, unspecified whether with hypoxia or hypercapnia: Secondary | ICD-10-CM

## 2014-01-15 DIAGNOSIS — R633 Feeding difficulties, unspecified: Secondary | ICD-10-CM | POA: Insufficient documentation

## 2014-01-15 DIAGNOSIS — R001 Bradycardia, unspecified: Secondary | ICD-10-CM | POA: Diagnosis not present

## 2014-01-15 DIAGNOSIS — J9601 Acute respiratory failure with hypoxia: Secondary | ICD-10-CM

## 2014-01-15 DIAGNOSIS — D473 Essential (hemorrhagic) thrombocythemia: Secondary | ICD-10-CM | POA: Diagnosis not present

## 2014-01-15 DIAGNOSIS — J189 Pneumonia, unspecified organism: Secondary | ICD-10-CM | POA: Diagnosis not present

## 2014-01-15 DIAGNOSIS — Z7982 Long term (current) use of aspirin: Secondary | ICD-10-CM

## 2014-01-15 DIAGNOSIS — R1312 Dysphagia, oropharyngeal phase: Secondary | ICD-10-CM | POA: Insufficient documentation

## 2014-01-15 DIAGNOSIS — R000 Tachycardia, unspecified: Secondary | ICD-10-CM

## 2014-01-15 DIAGNOSIS — G9340 Encephalopathy, unspecified: Secondary | ICD-10-CM

## 2014-01-15 DIAGNOSIS — R195 Other fecal abnormalities: Secondary | ICD-10-CM | POA: Insufficient documentation

## 2014-01-15 DIAGNOSIS — F10931 Alcohol use, unspecified with withdrawal delirium: Secondary | ICD-10-CM | POA: Insufficient documentation

## 2014-01-15 DIAGNOSIS — R0600 Dyspnea, unspecified: Secondary | ICD-10-CM

## 2014-01-15 DIAGNOSIS — R911 Solitary pulmonary nodule: Secondary | ICD-10-CM | POA: Diagnosis present

## 2014-01-15 DIAGNOSIS — R7881 Bacteremia: Secondary | ICD-10-CM | POA: Diagnosis present

## 2014-01-15 DIAGNOSIS — Z452 Encounter for adjustment and management of vascular access device: Secondary | ICD-10-CM

## 2014-01-15 DIAGNOSIS — J90 Pleural effusion, not elsewhere classified: Secondary | ICD-10-CM

## 2014-01-15 DIAGNOSIS — N170 Acute kidney failure with tubular necrosis: Secondary | ICD-10-CM | POA: Diagnosis not present

## 2014-01-15 DIAGNOSIS — E46 Unspecified protein-calorie malnutrition: Secondary | ICD-10-CM | POA: Diagnosis present

## 2014-01-15 DIAGNOSIS — Z720 Tobacco use: Secondary | ICD-10-CM | POA: Diagnosis present

## 2014-01-15 DIAGNOSIS — R0602 Shortness of breath: Secondary | ICD-10-CM

## 2014-01-15 DIAGNOSIS — Z992 Dependence on renal dialysis: Secondary | ICD-10-CM

## 2014-01-15 DIAGNOSIS — Z4659 Encounter for fitting and adjustment of other gastrointestinal appliance and device: Secondary | ICD-10-CM

## 2014-01-15 DIAGNOSIS — R0603 Acute respiratory distress: Secondary | ICD-10-CM

## 2014-01-15 DIAGNOSIS — R6521 Severe sepsis with septic shock: Secondary | ICD-10-CM | POA: Diagnosis present

## 2014-01-15 DIAGNOSIS — E876 Hypokalemia: Secondary | ICD-10-CM | POA: Diagnosis present

## 2014-01-15 DIAGNOSIS — R5383 Other fatigue: Secondary | ICD-10-CM

## 2014-01-15 DIAGNOSIS — Z430 Encounter for attention to tracheostomy: Secondary | ICD-10-CM

## 2014-01-15 DIAGNOSIS — D638 Anemia in other chronic diseases classified elsewhere: Secondary | ICD-10-CM | POA: Diagnosis present

## 2014-01-15 DIAGNOSIS — F10231 Alcohol dependence with withdrawal delirium: Secondary | ICD-10-CM | POA: Diagnosis present

## 2014-01-15 DIAGNOSIS — F419 Anxiety disorder, unspecified: Secondary | ICD-10-CM | POA: Diagnosis present

## 2014-01-15 DIAGNOSIS — Z789 Other specified health status: Secondary | ICD-10-CM

## 2014-01-15 DIAGNOSIS — R0902 Hypoxemia: Secondary | ICD-10-CM

## 2014-01-15 DIAGNOSIS — R140 Abdominal distension (gaseous): Secondary | ICD-10-CM

## 2014-01-15 DIAGNOSIS — N390 Urinary tract infection, site not specified: Secondary | ICD-10-CM | POA: Diagnosis not present

## 2014-01-15 DIAGNOSIS — F1020 Alcohol dependence, uncomplicated: Secondary | ICD-10-CM | POA: Diagnosis present

## 2014-01-15 DIAGNOSIS — G9341 Metabolic encephalopathy: Secondary | ICD-10-CM | POA: Diagnosis not present

## 2014-01-15 DIAGNOSIS — F1029 Alcohol dependence with unspecified alcohol-induced disorder: Secondary | ICD-10-CM

## 2014-01-15 DIAGNOSIS — K2980 Duodenitis without bleeding: Secondary | ICD-10-CM | POA: Diagnosis not present

## 2014-01-15 DIAGNOSIS — Z95828 Presence of other vascular implants and grafts: Secondary | ICD-10-CM

## 2014-01-15 DIAGNOSIS — Z6823 Body mass index (BMI) 23.0-23.9, adult: Secondary | ICD-10-CM | POA: Diagnosis not present

## 2014-01-15 DIAGNOSIS — J13 Pneumonia due to Streptococcus pneumoniae: Secondary | ICD-10-CM | POA: Diagnosis present

## 2014-01-15 DIAGNOSIS — E860 Dehydration: Secondary | ICD-10-CM | POA: Diagnosis present

## 2014-01-15 DIAGNOSIS — J9690 Respiratory failure, unspecified, unspecified whether with hypoxia or hypercapnia: Secondary | ICD-10-CM

## 2014-01-15 DIAGNOSIS — N1330 Unspecified hydronephrosis: Secondary | ICD-10-CM

## 2014-01-15 DIAGNOSIS — K567 Ileus, unspecified: Secondary | ICD-10-CM | POA: Diagnosis not present

## 2014-01-15 DIAGNOSIS — F329 Major depressive disorder, single episode, unspecified: Secondary | ICD-10-CM | POA: Diagnosis present

## 2014-01-15 DIAGNOSIS — F1910 Other psychoactive substance abuse, uncomplicated: Secondary | ICD-10-CM

## 2014-01-15 DIAGNOSIS — Z9889 Other specified postprocedural states: Secondary | ICD-10-CM

## 2014-01-15 HISTORY — DX: Major depressive disorder, single episode, unspecified: F32.9

## 2014-01-15 HISTORY — DX: Anxiety disorder, unspecified: F41.9

## 2014-01-15 HISTORY — DX: Depression, unspecified: F32.A

## 2014-01-15 HISTORY — DX: Pneumonia, unspecified organism: J18.9

## 2014-01-15 LAB — BLOOD GAS, ARTERIAL
Acid-base deficit: 0.5 mmol/L (ref 0.0–2.0)
BICARBONATE: 23.1 meq/L (ref 20.0–24.0)
Drawn by: 422461
O2 Content: 6 L/min
O2 Saturation: 95.3 %
PCO2 ART: 35.3 mmHg (ref 35.0–45.0)
Patient temperature: 98.2
TCO2: 21.2 mmol/L (ref 0–100)
pH, Arterial: 7.429 (ref 7.350–7.450)
pO2, Arterial: 79.2 mmHg — ABNORMAL LOW (ref 80.0–100.0)

## 2014-01-15 LAB — HEPATIC FUNCTION PANEL
ALK PHOS: 114 U/L (ref 39–117)
ALT: 24 U/L (ref 0–53)
AST: 50 U/L — ABNORMAL HIGH (ref 0–37)
Albumin: 2.3 g/dL — ABNORMAL LOW (ref 3.5–5.2)
Total Bilirubin: 0.7 mg/dL (ref 0.3–1.2)
Total Protein: 6.5 g/dL (ref 6.0–8.3)

## 2014-01-15 LAB — CBC
HCT: 34.6 % — ABNORMAL LOW (ref 39.0–52.0)
Hemoglobin: 12.1 g/dL — ABNORMAL LOW (ref 13.0–17.0)
MCH: 35.5 pg — AB (ref 26.0–34.0)
MCHC: 35 g/dL (ref 30.0–36.0)
MCV: 101.5 fL — ABNORMAL HIGH (ref 78.0–100.0)
PLATELETS: 220 10*3/uL (ref 150–400)
RBC: 3.41 MIL/uL — AB (ref 4.22–5.81)
RDW: 12.5 % (ref 11.5–15.5)
WBC: 10.5 10*3/uL (ref 4.0–10.5)

## 2014-01-15 LAB — CBC WITH DIFFERENTIAL/PLATELET
BASOS ABS: 0 10*3/uL (ref 0.0–0.1)
BASOS PCT: 0 % (ref 0–1)
EOS ABS: 0 10*3/uL (ref 0.0–0.7)
Eosinophils Relative: 0 % (ref 0–5)
HEMATOCRIT: 30.2 % — AB (ref 39.0–52.0)
Hemoglobin: 10.7 g/dL — ABNORMAL LOW (ref 13.0–17.0)
LYMPHS ABS: 0.3 10*3/uL — AB (ref 0.7–4.0)
LYMPHS PCT: 3 % — AB (ref 12–46)
MCH: 35.9 pg — ABNORMAL HIGH (ref 26.0–34.0)
MCHC: 35.4 g/dL (ref 30.0–36.0)
MCV: 101.3 fL — ABNORMAL HIGH (ref 78.0–100.0)
Monocytes Absolute: 0.2 10*3/uL (ref 0.1–1.0)
Monocytes Relative: 2 % — ABNORMAL LOW (ref 3–12)
NEUTROS ABS: 7.9 10*3/uL — AB (ref 1.7–7.7)
Neutrophils Relative %: 95 % — ABNORMAL HIGH (ref 43–77)
Platelets: 177 10*3/uL (ref 150–400)
RBC: 2.98 MIL/uL — ABNORMAL LOW (ref 4.22–5.81)
RDW: 12.4 % (ref 11.5–15.5)
WBC Morphology: INCREASED
WBC: 8.4 10*3/uL (ref 4.0–10.5)

## 2014-01-15 LAB — EXPECTORATED SPUTUM ASSESSMENT W REFEX TO RESP CULTURE

## 2014-01-15 LAB — BASIC METABOLIC PANEL
Anion gap: 22 — ABNORMAL HIGH (ref 5–15)
BUN: 11 mg/dL (ref 6–23)
CALCIUM: 8.8 mg/dL (ref 8.4–10.5)
CO2: 25 meq/L (ref 19–32)
Chloride: 85 mEq/L — ABNORMAL LOW (ref 96–112)
Creatinine, Ser: 0.51 mg/dL (ref 0.50–1.35)
GFR calc Af Amer: >90 mL/min (ref >90)
Glucose, Bld: 84 mg/dL (ref 70–99)
Potassium: 3.6 mEq/L — ABNORMAL LOW (ref 3.7–5.3)
SODIUM: 132 meq/L — AB (ref 137–147)

## 2014-01-15 LAB — I-STAT CG4 LACTIC ACID, ED
LACTIC ACID, VENOUS: 7.06 mmol/L — AB (ref 0.5–2.2)
Lactic Acid, Venous: 5.17 mmol/L — ABNORMAL HIGH (ref 0.5–2.2)

## 2014-01-15 LAB — ETHANOL: Alcohol, Ethyl (B): <11 mg/dL (ref 0–11)

## 2014-01-15 LAB — MRSA PCR SCREENING: MRSA by PCR: NEGATIVE

## 2014-01-15 LAB — EXPECTORATED SPUTUM ASSESSMENT W GRAM STAIN, RFLX TO RESP C

## 2014-01-15 LAB — CREATININE, SERUM
Creatinine, Ser: 0.51 mg/dL (ref 0.50–1.35)
GFR calc Af Amer: >90 mL/min (ref >90)
GFR calc non Af Amer: >90 mL/min (ref >90)

## 2014-01-15 LAB — I-STAT TROPONIN, ED: TROPONIN I, POC: 0 ng/mL (ref 0.00–0.08)

## 2014-01-15 MED ORDER — HYDRALAZINE HCL 20 MG/ML IJ SOLN
5.0000 mg | INTRAMUSCULAR | Status: DC | PRN
  Filled 2014-01-15: qty 1

## 2014-01-15 MED ORDER — SODIUM CHLORIDE 0.9 % IV BOLUS (SEPSIS)
1000.0000 mL | Freq: Once | INTRAVENOUS | Status: AC
  Administered 2014-01-15: 1000 mL via INTRAVENOUS

## 2014-01-15 MED ORDER — DEXTROSE 5 % IV SOLN: 1.0000 g | Freq: Once | INTRAVENOUS | Status: DC

## 2014-01-15 MED ORDER — LORAZEPAM 2 MG/ML IJ SOLN
1.0000 mg | Freq: Four times a day (QID) | INTRAMUSCULAR | Status: DC | PRN
  Administered 2014-01-16: 1 mg via INTRAVENOUS
  Filled 2014-01-15: qty 1

## 2014-01-15 MED ORDER — SODIUM CHLORIDE 0.9 % IV SOLN
INTRAVENOUS | Status: DC
  Administered 2014-01-15: 21:00:00 via INTRAVENOUS
  Administered 2014-01-16: 150 mL/h via INTRAVENOUS
  Administered 2014-01-16 – 2014-01-17 (×5): via INTRAVENOUS

## 2014-01-15 MED ORDER — THIAMINE HCL 100 MG/ML IJ SOLN
100.0000 mg | Freq: Every day | INTRAMUSCULAR | Status: DC
  Administered 2014-01-16 – 2014-01-18 (×3): 100 mg via INTRAVENOUS
  Filled 2014-01-15 (×3): qty 2

## 2014-01-15 MED ORDER — SERTRALINE HCL 50 MG PO TABS
50.0000 mg | ORAL_TABLET | Freq: Every day | ORAL | Status: DC
  Administered 2014-01-15 – 2014-01-16 (×2): 50 mg via ORAL
  Filled 2014-01-15 (×3): qty 1

## 2014-01-15 MED ORDER — DEXTROSE 5 % IV SOLN
500.0000 mg | Freq: Once | INTRAVENOUS | Status: AC
  Administered 2014-01-15: 500 mg via INTRAVENOUS
  Filled 2014-01-15: qty 500

## 2014-01-15 MED ORDER — LORAZEPAM 2 MG/ML IJ SOLN: 0.0000 mg | Freq: Two times a day (BID) | INTRAMUSCULAR | Status: DC

## 2014-01-15 MED ORDER — DEXTROSE 5 % IV SOLN: 500.0000 mg | INTRAVENOUS | Status: DC

## 2014-01-15 MED ORDER — LABETALOL HCL 5 MG/ML IV SOLN
INTRAVENOUS | Status: AC
  Filled 2014-01-15: qty 4

## 2014-01-15 MED ORDER — DEXTROSE 5 % IV SOLN
500.0000 mg | INTRAVENOUS | Status: DC
  Administered 2014-01-16 – 2014-01-17 (×2): 500 mg via INTRAVENOUS
  Filled 2014-01-15 (×2): qty 500

## 2014-01-15 MED ORDER — VITAMIN B-1 100 MG PO TABS
100.0000 mg | ORAL_TABLET | Freq: Every day | ORAL | Status: DC
  Administered 2014-01-15 – 2014-01-19 (×2): 100 mg via ORAL
  Filled 2014-01-15 (×2): qty 1

## 2014-01-15 MED ORDER — HEPARIN SODIUM (PORCINE) 5000 UNIT/ML IJ SOLN
5000.0000 [IU] | Freq: Three times a day (TID) | INTRAMUSCULAR | Status: DC
  Administered 2014-01-15 – 2014-01-21 (×17): 5000 [IU] via SUBCUTANEOUS
  Filled 2014-01-15 (×18): qty 1

## 2014-01-15 MED ORDER — THIAMINE HCL 100 MG/ML IJ SOLN: 100.0000 mg | Freq: Every day | INTRAMUSCULAR | Status: DC

## 2014-01-15 MED ORDER — VITAMIN B-1 100 MG PO TABS: 100.0000 mg | ORAL_TABLET | Freq: Every day | ORAL | Status: DC

## 2014-01-15 MED ORDER — LORAZEPAM 2 MG/ML IJ SOLN
0.0000 mg | Freq: Four times a day (QID) | INTRAMUSCULAR | Status: DC
  Administered 2014-01-15: 1 mg via INTRAVENOUS
  Administered 2014-01-16: 2 mg via INTRAVENOUS
  Filled 2014-01-15 (×2): qty 1

## 2014-01-15 MED ORDER — LABETALOL HCL 5 MG/ML IV SOLN
10.0000 mg | Freq: Once | INTRAVENOUS | Status: AC
  Administered 2014-01-15: 10 mg via INTRAVENOUS

## 2014-01-15 MED ORDER — CEFTRIAXONE SODIUM 1 G IJ SOLR
1.0000 g | Freq: Once | INTRAMUSCULAR | Status: AC
Start: 2014-01-15 — End: 2014-01-15
  Administered 2014-01-15: 1 g via INTRAVENOUS
  Filled 2014-01-15: qty 10

## 2014-01-15 MED ORDER — BUPROPION HCL ER (XL) 150 MG PO TB24
150.0000 mg | ORAL_TABLET | Freq: Every day | ORAL | Status: DC
  Administered 2014-01-15: 150 mg via ORAL
  Filled 2014-01-15 (×2): qty 1

## 2014-01-15 MED ORDER — NICOTINE 14 MG/24HR TD PT24
14.0000 mg | MEDICATED_PATCH | Freq: Every day | TRANSDERMAL | Status: DC
  Administered 2014-01-15 – 2014-01-28 (×14): 14 mg via TRANSDERMAL
  Filled 2014-01-15 (×14): qty 1

## 2014-01-15 MED ORDER — FOLIC ACID 1 MG PO TABS
1.0000 mg | ORAL_TABLET | Freq: Every day | ORAL | Status: DC
  Administered 2014-01-15 – 2014-02-02 (×19): 1 mg via ORAL
  Filled 2014-01-15 (×19): qty 1

## 2014-01-15 MED ORDER — ADULT MULTIVITAMIN W/MINERALS CH
1.0000 | ORAL_TABLET | Freq: Every day | ORAL | Status: DC
  Administered 2014-01-15: 1 via ORAL
  Filled 2014-01-15: qty 1

## 2014-01-15 MED ORDER — LABETALOL HCL 5 MG/ML IV SOLN
5.0000 mg | Freq: Once | INTRAVENOUS | Status: AC
  Administered 2014-01-15: 5 mg via INTRAVENOUS
  Filled 2014-01-15: qty 4

## 2014-01-15 MED ORDER — LORAZEPAM 1 MG PO TABS: 0.0000 mg | ORAL_TABLET | Freq: Two times a day (BID) | ORAL | Status: DC

## 2014-01-15 MED ORDER — LORAZEPAM 1 MG PO TABS
0.0000 mg | ORAL_TABLET | Freq: Four times a day (QID) | ORAL | Status: DC
  Administered 2014-01-15: 2 mg via ORAL
  Administered 2014-01-15: 1 mg via ORAL
  Filled 2014-01-15: qty 2
  Filled 2014-01-15: qty 1

## 2014-01-15 MED ORDER — LORAZEPAM 1 MG PO TABS: 1.0000 mg | ORAL_TABLET | Freq: Four times a day (QID) | ORAL | Status: DC | PRN

## 2014-01-15 MED ORDER — SODIUM CHLORIDE 0.9 % IV BOLUS (SEPSIS)
2000.0000 mL | Freq: Once | INTRAVENOUS | Status: AC
  Administered 2014-01-15: 2000 mL via INTRAVENOUS

## 2014-01-15 MED ORDER — ENSURE COMPLETE PO LIQD: 237.0000 mL | Freq: Two times a day (BID) | ORAL | Status: DC

## 2014-01-15 MED ORDER — DEXTROSE 5 % IV SOLN: 1.0000 g | INTRAVENOUS | Status: DC

## 2014-01-15 NOTE — Progress Notes (Signed)
Utilization Review completed.  Laurann Mcmorris RN CM  

## 2014-01-15 NOTE — ED Notes (Addendum)
Pt sent from Urgent care. Dx with pneumonia and emphysema. RLL PNA. productive cough yellow sputum x5 days. Pt daily drinker, 3 drinks per day x30 years. Pt reports he will go into withdrawal in an hour or so.

## 2014-01-15 NOTE — Progress Notes (Signed)
RT placed patient on BIPAP. Patient tried twice to wear mask ,but could not tolerate. RT notified RN. RT will continue to monitor and assess patient throughout the night.

## 2014-01-15 NOTE — H&P (Addendum)
[facility] History and Physical  Allen Wood [id number]:[id number] DOB: 1970-06-19 DOA: 01/15/2014  Referring physician: DR Star Age PCP: [place]   Chief Complaint: cough and SOB  HPI: Allen Wood is a 43 y.o. male  Cough started 4 days ago. Productive w/ yellow sputum. Getting worse. Associated w/ R posterior back pain. Post tussive emesis. Chills and sweating at home. OTC cold medicine w/o relief. Feeling very SOB today.   Pt undergoing recent workup w/ PCP for fatigue which has been ongoing since previous pneumonia. Started on Sertraline and Welbutrin for suspected depression. Pt sleeps much of the day now. No real improvement.   Review of Systems:  Constitutional:  fatigue.  HEENT:  No headaches, Difficulty swallowing,Tooth/dental problems,Sore throat,  No sneezing, itching, ear ache, nasal congestion, post nasal drip,  Cardio-vascular:  No Orthopnea, PND, swelling in lower extremities, anasarca, dizziness, palpitations  GI:  No heartburn, indigestion, abdominal pain, nausea, vomiting, diarrhea, change in bowel habits, Resp:  Per HPI Skin:  no rash or lesions.  GU:  no dysuria, change in color of urine, no urgency or frequency. No flank pain.  Musculoskeletal:  No joint pain or swelling. No decreased range of motion. No back pain.  Psych:  No change in mood or affect. No depression or anxiety. No memory loss.   Past Medical History  Diagnosis Date  . Alcohol abuse   . Depression    Past Surgical History  Procedure Laterality Date  . Mouth surgery      teeth removed.   Social History:  reports that he has been smoking Cigarettes.  He has a 10 pack-year smoking history. He has never used smokeless tobacco. He reports that he drinks alcohol. He reports that he does not use illicit drugs.  No Known Allergies  Family History  Problem Relation Age of Onset  . Cancer Mother     lung  . Cancer Father     kidney      Prior to Admission medications   Medication Sig Start Date End Date Taking? Authorizing Provider  aspirin 325 MG tablet Take 325 mg by mouth daily.    Historical Provider, MD  aspirin-sod bicarb-citric acid (ALKA-SELTZER) 325 MG TBEF tablet Take 325 mg by mouth every 6 (six) hours as needed (upset stomach).    Historical Provider, MD  azithromycin (ZITHROMAX) 500 MG tablet Take 1 tablet (500 mg total) by mouth daily. 09/15/13   [name], MD  predniSONE (DELTASONE) 10 MG tablet Takes 6 tablets for 1 days, then 5 tablets for 1 days, then 4 tablets for 1 days, then 3 tablets for 1 days, then 2 tabs for 1 days, then 1 tab for 1 days, and then stop. 09/15/13   [name], MD   Physical Exam: Filed Vitals:   01/15/14 1851 01/15/14 1900 01/15/14 1918 01/15/14 1926  BP:  173/105 142/97 139/98  Pulse:  76 137 133  Temp:      TempSrc:      Resp:    35  SpO2: 96% 95% 94% 96%    Wt Readings from Last 3 Encounters:  09/14/13 65.726 kg (144 lb 14.4 oz)    General:  Appears to be in distress Eyes:  PERRL, normal lids, irises & conjunctiva ENT: Dry mm Neck:  no LAD, masses or thyromegaly Cardiovascular:  RRR, no m/r/g. No LE edema. Telemetry:  SR, no arrhythmias  Respiratory: minimal aeration of the RLL in posterior fields w/ crackle and  ronchi heard in RUL and minimally in L lobes (likely from transmititon from Rlung) Abdomen:  soft, ntnd Skin:  no rash or induration seen on limited exam Musculoskeletal:  grossly normal tone BUE/BLE Psychiatric:  grossly normal mood and affect, speech fluent and appropriate Neurologic:  grossly non-focal.          Labs on Admission:  Basic Metabolic Panel:  Recent Labs Lab 01/15/14 1602  NA 132*  K 3.6*  CL 85*  CO2 25  GLUCOSE 84  BUN 11  CREATININE 0.51  CALCIUM 8.8   Liver Function Tests:  Recent Labs Lab 01/15/14 1602  AST 50*  ALT 24  ALKPHOS 114  BILITOT 0.7  PROT 6.5  ALBUMIN 2.3*   No results for  input(s): LIPASE, AMYLASE in the last 168 hours. No results for input(s): AMMONIA in the last 168 hours. CBC:  Recent Labs Lab 01/15/14 1602  WBC 10.5  HGB 12.1*  HCT 34.6*  MCV 101.5*  PLT 220   Cardiac Enzymes: No results for input(s): CKTOTAL, CKMB, CKMBINDEX, TROPONINI in the last 168 hours.  BNP (last 3 results)  Recent Labs  09/14/13 0920  PROBNP 11.9   CBG: No results for input(s): GLUCAP in the last 168 hours.  Radiological Exams on Admission: Dg Chest 2 View (if Patient Has Fever And/or Copd)  01/15/2014   CLINICAL DATA:  Pneumonia. Emphysema. Productive sputum. Shortness of breath. Right chest pain.  EXAM: CHEST  2 VIEW  COMPARISON:  11/03/2013  FINDINGS: Consolidation of a large portion of the right lower lobe. Suspected right pleural effusion. The left lung appears clear. Cardiac and mediastinal margins appear normal.  IMPRESSION: 1. Consolidation of much of the right lower lobe along with a suspected right pleural effusion. Followup chest radiography is recommended in 4 weeks time to ensure resolution and exclude underlying malignancy.   Electronically Signed   By: Sherryl Barters M.D.   On: 01/15/2014 16:59    EKG: Independently reviewed. Sinus Tach, ?PVC, no sign of ACS  Assessment/Plan Active Problems:   CAP (community acquired pneumonia)   Nicotine abuse   EtOH dependence   Respiratory distress   Fatigue   SIRS (systemic inflammatory response syndrome)   CAP: CXR consistent w/ impressive consolidation of the RLL. Pt tachycardic and tachypneic  LLL pneumonia 09/14/13. Treated w/ Azithro w/ resolution. CT negative for mass in August. O2 sats preserved, but breathing w/ difficulty. Pt felt worse after receiving Ativan but better w/ IVf (Total of 3L NS). Lactic acid 7 but trending down to 5 on admission.  - Admit to stepdown - ABG - consider Lasix if not improving - f/u BCX x2 - sputum Cx - Flu PCR - Legionella, Strep PNeumo - Continue CTX and  Azithro - NS 121ml/hr - consider Lasix if respiratory status worsens.   Hypertension: BP 186/106. Likely secondary to stress/anxiety, pneumonia, and possible R heart strain. Troponin Neg. - Labetalol x1 - Hydralazine PRN SBP>180  ETOH: h/o abuse. Last drink last night. Drinks 1-2 pints liquor daily - CIWA  Tobacco: 0.5ppd smoker - Nicotine patch  Fatigue: likely from pneumonia and depression.  - Continue sertraline and Wellbutrin - PT/OT   Code Status: FULL DVT Prophylaxis: Heparin Family Communication: Wife and Son Disposition Plan: pending improvement - [facility] [email or web address] Password [email or web address]

## 2014-01-15 NOTE — Progress Notes (Signed)
Patient does not wear CPAP at home and has never had a sleep study done.

## 2014-01-15 NOTE — ED Provider Notes (Signed)
CSN: [id number]     Arrival date & time 01/15/14  28 History   First MD Initiated Contact with Patient 01/15/14 1550     Chief Complaint  Patient presents with  . pneumonia    . ETOH withdrawal      (Consider location/radiation/quality/duration/timing/severity/associated sxs/prior Treatment) Patient is a 43 y.o. male presenting with cough. The history is provided by the patient.  Cough Cough characteristics:  Productive Sputum characteristics:  Yellow Severity:  Moderate Onset quality:  Gradual Duration:  4 days Timing:  Constant Progression:  Unchanged Chronicity:  New Smoker: yes   Context: not sick contacts   Relieved by:  Nothing   Past Medical History  Diagnosis Date  . Alcohol abuse    Past Surgical History  Procedure Laterality Date  . Mouth surgery      teeth removed.   History reviewed. No pertinent family history. History  Substance Use Topics  . Smoking status: Current Every Day Smoker -- 1.00 packs/day for 20 years    Types: Cigarettes  . Smokeless tobacco: Never Used  . Alcohol Use: Yes     Comment: pint- fifth of vodka a day    Review of Systems  Respiratory: Positive for cough.       Allergies  Review of patient's allergies indicates no known allergies.  Home Medications   Prior to Admission medications   Medication Sig Start Date End Date Taking? Authorizing Provider  aspirin 325 MG tablet Take 325 mg by mouth daily.    Historical Provider, MD  aspirin-sod bicarb-citric acid (ALKA-SELTZER) 325 MG TBEF tablet Take 325 mg by mouth every 6 (six) hours as needed (upset stomach).    Historical Provider, MD  azithromycin (ZITHROMAX) 500 MG tablet Take 1 tablet (500 mg total) by mouth daily. 09/15/13   [name], MD  predniSONE (DELTASONE) 10 MG tablet Takes 6 tablets for 1 days, then 5 tablets for 1 days, then 4 tablets for 1 days, then 3 tablets for 1 days, then 2 tabs for 1 days, then 1 tab for 1 days, and then stop. 09/15/13    [name], MD   BP 147/85 mmHg  Pulse 128  Temp(Src) 98.2 F (36.8 C) (Oral)  Resp 24  SpO2 94% Physical Exam  Constitutional: He is oriented to person, place, and time. He appears well-developed and well-nourished. No distress.  HENT:  Head: Normocephalic and atraumatic.  Mouth/Throat: No oropharyngeal exudate.  Eyes: EOM are normal. Pupils are equal, round, and reactive to light.  Neck: Normal range of motion. Neck supple.  Cardiovascular: Regular rhythm.  Tachycardia present.  Exam reveals no friction rub.   No murmur heard. Pulmonary/Chest: Effort normal and breath sounds normal. No respiratory distress. He has no wheezes. He has no rales.  Abdominal: He exhibits mass (Liver enlarged). He exhibits no distension. There is no tenderness. There is no rebound.  Musculoskeletal: Normal range of motion. He exhibits no edema.  Neurological: He is alert and oriented to person, place, and time. He displays tremor (mild, fine, diffuse). No cranial nerve deficit or sensory deficit. GCS eye subscore is 4. GCS verbal subscore is 5. GCS motor subscore is 6.  Skin: He is not diaphoretic.  Nursing note and vitals reviewed.   ED Course  Procedures (including critical care time) Labs Review Labs Reviewed  CBC  BASIC METABOLIC PANEL  ETHANOL  I-STAT [place], ED  I-STAT CG4 LACTIC ACID, ED    Imaging Review No results found.   EKG Interpretation  Date/Time:  Monday January 15 2014 16:01:00 EST Ventricular Rate:  121 PR Interval:  103 QRS Duration: 82 QT Interval:  346 QTC Calculation: 491 R Axis:   93 Text Interpretation:  Sinus tachycardia Ventricular premature complex  Aberrant complex Borderline right axis deviation Borderline prolonged QT  interval Baseline wander in lead(s) II III aVF V6 Similar to prior  Terrible artifact Confirmed by Mingo Amber  MD, Desree Leap ([id number]) on 01/15/2014  4:03:25 PM     CRITICAL CARE Performed by: Osvaldo Shipper   Total  critical care time: 30 minutes  Critical care time was exclusive of separately billable procedures and treating other patients.  Critical care was necessary to treat or prevent imminent or life-threatening deterioration.  Critical care was time spent personally by me on the following activities: development of treatment plan with patient and/or surrogate as well as nursing, discussions with consultants, evaluation of patient's response to treatment, examination of patient, obtaining history from patient or surrogate, ordering and performing treatments and interventions, ordering and review of laboratory studies, ordering and review of radiographic studies, pulse oximetry and re-evaluation of patient's condition.  MDM   Final diagnoses:  CAP (community acquired pneumonia)  Tachycardia  Hypoxia    6M sent from [facility] for pneumonia. Hx of emphysema. Also is an alcoholic, last drink last night. Very tremulous, thinks he is withdrawing.  Here afebrile, tachycardic to the high 120s. Normotensive. Plan for fluids, CIWA, antibiotics. Lactate elevated at 7, repeat after 2 L NS is 5.1.  Persistently tachycardic. Another liter of fluid given. Admitted to medicine, to stepdown unit.    Evelina Bucy, MD 01/15/14 [phone]

## 2014-01-15 NOTE — ED Notes (Signed)
Called to give report to stepdown. Charge nurse will call back.

## 2014-01-16 ENCOUNTER — Inpatient Hospital Stay (HOSPITAL_COMMUNITY): Payer: Medicaid Other

## 2014-01-16 DIAGNOSIS — R911 Solitary pulmonary nodule: Secondary | ICD-10-CM | POA: Diagnosis present

## 2014-01-16 DIAGNOSIS — J90 Pleural effusion, not elsewhere classified: Secondary | ICD-10-CM

## 2014-01-16 DIAGNOSIS — R000 Tachycardia, unspecified: Secondary | ICD-10-CM

## 2014-01-16 DIAGNOSIS — J9600 Acute respiratory failure, unspecified whether with hypoxia or hypercapnia: Secondary | ICD-10-CM

## 2014-01-16 DIAGNOSIS — D649 Anemia, unspecified: Secondary | ICD-10-CM

## 2014-01-16 DIAGNOSIS — R7881 Bacteremia: Secondary | ICD-10-CM | POA: Diagnosis present

## 2014-01-16 DIAGNOSIS — D638 Anemia in other chronic diseases classified elsewhere: Secondary | ICD-10-CM | POA: Diagnosis present

## 2014-01-16 LAB — BLOOD GAS, ARTERIAL
Acid-Base Excess: 2.5 mmol/L — ABNORMAL HIGH (ref 0.0–2.0)
Acid-base deficit: 0.5 mmol/L (ref 0.0–2.0)
BICARBONATE: 25.2 meq/L — AB (ref 20.0–24.0)
BICARBONATE: 24 meq/L (ref 20.0–24.0)
Drawn by: 276051
Drawn by: 276051
FIO2: 0.4 %
MECHVT: 600 mL
O2 Content: 6 L/min
O2 SAT: 89.3 %
O2 SAT: 95 %
PATIENT TEMPERATURE: 98.6
PATIENT TEMPERATURE: 98.6
PEEP: 5 cmH2O
PH ART: 7.489 — AB (ref 7.350–7.450)
RATE: 14 resp/min
TCO2: 22.6 mmol/L (ref 0–100)
TCO2: 21.4 mmol/L (ref 0–100)
pCO2 arterial: 33.5 mmHg — ABNORMAL LOW (ref 35.0–45.0)
pCO2 arterial: 41.4 mmHg (ref 35.0–45.0)
pH, Arterial: 7.382 (ref 7.350–7.450)
pO2, Arterial: 58.8 mmHg — ABNORMAL LOW (ref 80.0–100.0)
pO2, Arterial: 83 mmHg (ref 80.0–100.0)

## 2014-01-16 LAB — CBC WITH DIFFERENTIAL/PLATELET
BASOS PCT: 0 % (ref 0–1)
Basophils Absolute: 0 10*3/uL (ref 0.0–0.1)
EOS PCT: 0 % (ref 0–5)
Eosinophils Absolute: 0 10*3/uL (ref 0.0–0.7)
HCT: 28.7 % — ABNORMAL LOW (ref 39.0–52.0)
HEMOGLOBIN: 10 g/dL — AB (ref 13.0–17.0)
LYMPHS PCT: 4 % — AB (ref 12–46)
Lymphs Abs: 0.5 10*3/uL — ABNORMAL LOW (ref 0.7–4.0)
MCH: 35.7 pg — ABNORMAL HIGH (ref 26.0–34.0)
MCHC: 34.8 g/dL (ref 30.0–36.0)
MCV: 102.5 fL — ABNORMAL HIGH (ref 78.0–100.0)
MONOS PCT: 1 % — AB (ref 3–12)
Monocytes Absolute: 0.1 10*3/uL (ref 0.1–1.0)
NEUTROS PCT: 95 % — AB (ref 43–77)
Neutro Abs: 11.8 10*3/uL — ABNORMAL HIGH (ref 1.7–7.7)
Platelets: 173 10*3/uL (ref 150–400)
RBC: 2.8 MIL/uL — AB (ref 4.22–5.81)
RDW: 12.6 % (ref 11.5–15.5)
WBC: 12.4 10*3/uL — ABNORMAL HIGH (ref 4.0–10.5)

## 2014-01-16 LAB — GLUCOSE, CAPILLARY
Glucose-Capillary: 89 mg/dL (ref 70–99)
Glucose-Capillary: 101 mg/dL — ABNORMAL HIGH (ref 70–99)
Glucose-Capillary: 117 mg/dL — ABNORMAL HIGH (ref 70–99)

## 2014-01-16 LAB — COMPREHENSIVE METABOLIC PANEL
ALBUMIN: 2 g/dL — AB (ref 3.5–5.2)
ALK PHOS: 91 U/L (ref 39–117)
ALT: 29 U/L (ref 0–53)
AST: 87 U/L — ABNORMAL HIGH (ref 0–37)
Anion gap: 11 (ref 5–15)
BUN: 12 mg/dL (ref 6–23)
CO2: 25 mmol/L (ref 19–32)
CREATININE: 0.55 mg/dL (ref 0.50–1.35)
Calcium: 6.6 mg/dL — ABNORMAL LOW (ref 8.4–10.5)
Chloride: 101 mEq/L (ref 96–112)
GFR calc Af Amer: >90 mL/min (ref >90)
GFR calc non Af Amer: >90 mL/min (ref >90)
Glucose, Bld: 82 mg/dL (ref 70–99)
Potassium: 3.2 mmol/L — ABNORMAL LOW (ref 3.5–5.1)
Sodium: 137 mmol/L (ref 135–145)
Total Bilirubin: 0.5 mg/dL (ref 0.3–1.2)
Total Protein: 4.9 g/dL — ABNORMAL LOW (ref 6.0–8.3)

## 2014-01-16 LAB — RETICULOCYTES
RBC.: 2.78 MIL/uL — ABNORMAL LOW (ref 4.22–5.81)
RETIC CT PCT: 1.5 % (ref 0.4–3.1)
Retic Count, Absolute: 41.7 10*3/uL (ref 19.0–186.0)

## 2014-01-16 LAB — MAGNESIUM: Magnesium: 1.1 mg/dL — ABNORMAL LOW (ref 1.5–2.5)

## 2014-01-16 LAB — VITAMIN B12: Vitamin B-12: 797 pg/mL (ref 211–911)

## 2014-01-16 LAB — IRON AND TIBC
Iron: 10 ug/dL — ABNORMAL LOW (ref 42–135)
Saturation Ratios: 11 % — ABNORMAL LOW (ref 20–55)
TIBC: 94 ug/dL — ABNORMAL LOW (ref 215–435)
UIBC: 84 ug/dL — AB (ref 125–400)

## 2014-01-16 LAB — LEGIONELLA ANTIGEN, URINE

## 2014-01-16 LAB — FOLATE: Folate: 5.9 ng/mL

## 2014-01-16 LAB — LACTIC ACID, PLASMA: Lactic Acid, Venous: 1.4 mmol/L (ref 0.5–2.2)

## 2014-01-16 LAB — INFLUENZA PANEL BY PCR (TYPE A & B)
H1N1FLUPCR: NOT DETECTED
INFLBPCR: NEGATIVE
Influenza A By PCR: NEGATIVE

## 2014-01-16 LAB — STREP PNEUMONIAE URINARY ANTIGEN: Strep Pneumo Urinary Antigen: POSITIVE — AB

## 2014-01-16 LAB — PROCALCITONIN: PROCALCITONIN: 25.8 ng/mL

## 2014-01-16 LAB — FERRITIN: Ferritin: 2124 ng/mL — ABNORMAL HIGH (ref 22–322)

## 2014-01-16 MED ORDER — DEXMEDETOMIDINE HCL IN NACL 200 MCG/50ML IV SOLN
0.4000 ug/kg/h | INTRAVENOUS | Status: DC
  Administered 2014-01-16 (×3): 0.4 ug/kg/h via INTRAVENOUS
  Administered 2014-01-17: 0.9 ug/kg/h via INTRAVENOUS
  Administered 2014-01-17: 0.6 ug/kg/h via INTRAVENOUS
  Administered 2014-01-17: 0.9 ug/kg/h via INTRAVENOUS
  Administered 2014-01-17: 0.6 ug/kg/h via INTRAVENOUS
  Administered 2014-01-17: 1.2 ug/kg/h via INTRAVENOUS
  Administered 2014-01-17: 0.4 ug/kg/h via INTRAVENOUS
  Administered 2014-01-18 (×2): 1.2 ug/kg/h via INTRAVENOUS
  Filled 2014-01-16 (×8): qty 50
  Filled 2014-01-16: qty 100
  Filled 2014-01-16 (×2): qty 50

## 2014-01-16 MED ORDER — CETYLPYRIDINIUM CHLORIDE 0.05 % MT LIQD
7.0000 mL | Freq: Four times a day (QID) | OROMUCOSAL | Status: DC
  Administered 2014-01-16 – 2014-02-08 (×91): 7 mL via OROMUCOSAL

## 2014-01-16 MED ORDER — IPRATROPIUM BROMIDE 0.02 % IN SOLN
0.5000 mg | Freq: Four times a day (QID) | RESPIRATORY_TRACT | Status: DC
  Administered 2014-01-16 – 2014-01-30 (×57): 0.5 mg via RESPIRATORY_TRACT
  Filled 2014-01-16 (×57): qty 2.5

## 2014-01-16 MED ORDER — POTASSIUM CHLORIDE 10 MEQ/100ML IV SOLN
10.0000 meq | INTRAVENOUS | Status: AC
  Administered 2014-01-16 (×4): 10 meq via INTRAVENOUS
  Filled 2014-01-16 (×3): qty 100

## 2014-01-16 MED ORDER — ADULT MULTIVITAMIN LIQUID CH
5.0000 mL | Freq: Every day | ORAL | Status: DC
  Administered 2014-01-16 – 2014-02-08 (×24): 5 mL
  Filled 2014-01-16 (×25): qty 5

## 2014-01-16 MED ORDER — MIDAZOLAM HCL 2 MG/2ML IJ SOLN
INTRAMUSCULAR | Status: AC
  Administered 2014-01-16: 2 mg
  Filled 2014-01-16: qty 4

## 2014-01-16 MED ORDER — NOREPINEPHRINE BITARTRATE 1 MG/ML IV SOLN
2.0000 ug/min | INTRAVENOUS | Status: DC
  Administered 2014-01-16: 2 ug/min via INTRAVENOUS
  Administered 2014-01-18: 8 ug/min via INTRAVENOUS
  Administered 2014-01-18: 2 ug/min via INTRAVENOUS
  Administered 2014-01-19: 5 ug/min via INTRAVENOUS
  Filled 2014-01-16 (×4): qty 4

## 2014-01-16 MED ORDER — LEVALBUTEROL HCL 0.63 MG/3ML IN NEBU: 0.6300 mg | INHALATION_SOLUTION | RESPIRATORY_TRACT | Status: DC | PRN

## 2014-01-16 MED ORDER — DEXTROSE 5 % IV SOLN
1.0000 g | Freq: Three times a day (TID) | INTRAVENOUS | Status: DC
  Filled 2014-01-16: qty 1

## 2014-01-16 MED ORDER — VANCOMYCIN HCL IN DEXTROSE 1-5 GM/200ML-% IV SOLN: 1000.0000 mg | Freq: Three times a day (TID) | INTRAVENOUS | Status: DC

## 2014-01-16 MED ORDER — SODIUM CHLORIDE 0.9 % IV SOLN
1.0000 g | Freq: Once | INTRAVENOUS | Status: AC
  Administered 2014-01-16: 1 g via INTRAVENOUS
  Filled 2014-01-16 (×2): qty 10

## 2014-01-16 MED ORDER — CHLORHEXIDINE GLUCONATE 0.12 % MT SOLN
15.0000 mL | Freq: Two times a day (BID) | OROMUCOSAL | Status: DC
  Administered 2014-01-16 – 2014-02-08 (×45): 15 mL via OROMUCOSAL
  Filled 2014-01-16 (×46): qty 15

## 2014-01-16 MED ORDER — PANTOPRAZOLE SODIUM 40 MG IV SOLR
40.0000 mg | INTRAVENOUS | Status: DC
  Administered 2014-01-16 – 2014-01-18 (×3): 40 mg via INTRAVENOUS
  Filled 2014-01-16 (×3): qty 40

## 2014-01-16 MED ORDER — GUAIFENESIN 100 MG/5ML PO SOLN
20.0000 mL | ORAL | Status: DC
  Administered 2014-01-16 – 2014-01-31 (×88): 400 mg
  Filled 2014-01-16 (×4): qty 20
  Filled 2014-01-16: qty 10
  Filled 2014-01-16 (×22): qty 20
  Filled 2014-01-16: qty 10
  Filled 2014-01-16 (×7): qty 20
  Filled 2014-01-16 (×2): qty 10
  Filled 2014-01-16 (×2): qty 20
  Filled 2014-01-16: qty 10
  Filled 2014-01-16 (×2): qty 20
  Filled 2014-01-16: qty 10
  Filled 2014-01-16 (×10): qty 20
  Filled 2014-01-16: qty 10
  Filled 2014-01-16 (×9): qty 20
  Filled 2014-01-16: qty 10
  Filled 2014-01-16: qty 20
  Filled 2014-01-16: qty 10
  Filled 2014-01-16 (×7): qty 20
  Filled 2014-01-16 (×2): qty 10
  Filled 2014-01-16 (×8): qty 20
  Filled 2014-01-16: qty 10
  Filled 2014-01-16 (×5): qty 20
  Filled 2014-01-16: qty 10
  Filled 2014-01-16 (×3): qty 20

## 2014-01-16 MED ORDER — VITAL AF 1.2 CAL PO LIQD
1000.0000 mL | ORAL | Status: DC
  Administered 2014-01-16: 1000 mL
  Filled 2014-01-16 (×2): qty 1000

## 2014-01-16 MED ORDER — IPRATROPIUM BROMIDE 0.02 % IN SOLN: 0.5000 mg | RESPIRATORY_TRACT | Status: DC | PRN

## 2014-01-16 MED ORDER — SUCCINYLCHOLINE CHLORIDE 20 MG/ML IJ SOLN
INTRAMUSCULAR | Status: AC
  Filled 2014-01-16: qty 1

## 2014-01-16 MED ORDER — SODIUM CHLORIDE 0.9 % IV BOLUS (SEPSIS)
1000.0000 mL | Freq: Once | INTRAVENOUS | Status: AC
  Administered 2014-01-16: 1000 mL via INTRAVENOUS

## 2014-01-16 MED ORDER — FENTANYL CITRATE 0.05 MG/ML IJ SOLN
100.0000 ug | INTRAMUSCULAR | Status: AC
  Administered 2014-01-16: 100 ug via INTRAVENOUS

## 2014-01-16 MED ORDER — POTASSIUM CHLORIDE CRYS ER 20 MEQ PO TBCR
40.0000 meq | EXTENDED_RELEASE_TABLET | ORAL | Status: DC
  Filled 2014-01-16: qty 2

## 2014-01-16 MED ORDER — ETOMIDATE 2 MG/ML IV SOLN
INTRAVENOUS | Status: AC
  Administered 2014-01-16: 20 mg
  Filled 2014-01-16: qty 20

## 2014-01-16 MED ORDER — SODIUM CHLORIDE 0.9 % IV SOLN
25.0000 ug/h | INTRAVENOUS | Status: DC
  Administered 2014-01-16: 50 ug/h via INTRAVENOUS
  Administered 2014-01-16 – 2014-01-17 (×3): 250 ug/h via INTRAVENOUS
  Administered 2014-01-18 – 2014-01-19 (×2): 100 ug/h via INTRAVENOUS
  Filled 2014-01-16 (×6): qty 50

## 2014-01-16 MED ORDER — VITAL HIGH PROTEIN PO LIQD
1000.0000 mL | ORAL | Status: DC
  Filled 2014-01-16: qty 1000

## 2014-01-16 MED ORDER — DEXTROSE 5 % IV SOLN: 2.0000 g | INTRAVENOUS | Status: DC

## 2014-01-16 MED ORDER — MIDAZOLAM HCL 2 MG/2ML IJ SOLN
2.0000 mg | Freq: Once | INTRAMUSCULAR | Status: AC
  Administered 2014-01-16: 2 mg via INTRAVENOUS

## 2014-01-16 MED ORDER — MIDAZOLAM HCL 2 MG/2ML IJ SOLN
1.0000 mg | INTRAMUSCULAR | Status: DC | PRN
  Administered 2014-01-16 – 2014-01-17 (×5): 2 mg via INTRAVENOUS
  Administered 2014-01-18: 1 mg via INTRAVENOUS
  Filled 2014-01-16 (×7): qty 2

## 2014-01-16 MED ORDER — POTASSIUM CHLORIDE 20 MEQ/15ML (10%) PO SOLN
40.0000 meq | Freq: Once | ORAL | Status: AC
  Administered 2014-01-16: 40 meq
  Filled 2014-01-16: qty 30

## 2014-01-16 MED ORDER — LIDOCAINE HCL (CARDIAC) 20 MG/ML IV SOLN
INTRAVENOUS | Status: AC
  Administered 2014-01-16: 14:00:00
  Filled 2014-01-16: qty 5

## 2014-01-16 MED ORDER — MAGNESIUM SULFATE 2 GM/50ML IV SOLN
2.0000 g | Freq: Once | INTRAVENOUS | Status: AC
  Administered 2014-01-16: 2 g via INTRAVENOUS
  Filled 2014-01-16: qty 50

## 2014-01-16 MED ORDER — GUAIFENESIN ER 600 MG PO TB12: 1200.0000 mg | ORAL_TABLET | Freq: Two times a day (BID) | ORAL | Status: DC

## 2014-01-16 MED ORDER — LEVALBUTEROL HCL 0.63 MG/3ML IN NEBU
0.6300 mg | INHALATION_SOLUTION | Freq: Four times a day (QID) | RESPIRATORY_TRACT | Status: DC
  Administered 2014-01-16 – 2014-01-30 (×57): 0.63 mg via RESPIRATORY_TRACT
  Filled 2014-01-16 (×73): qty 3

## 2014-01-16 MED ORDER — CEFTRIAXONE SODIUM IN DEXTROSE 40 MG/ML IV SOLN
2.0000 g | INTRAVENOUS | Status: DC
  Administered 2014-01-16 – 2014-01-23 (×9): 2 g via INTRAVENOUS
  Filled 2014-01-16 (×10): qty 50

## 2014-01-16 MED ORDER — LABETALOL HCL 5 MG/ML IV SOLN: 10.0000 mg | INTRAVENOUS | Status: DC | PRN

## 2014-01-16 MED ORDER — ROCURONIUM BROMIDE 50 MG/5ML IV SOLN
INTRAVENOUS | Status: AC
Start: 2014-01-16 — End: 2014-01-16
  Administered 2014-01-16: 70 mg
  Filled 2014-01-16: qty 2

## 2014-01-16 MED ORDER — FENTANYL CITRATE 0.05 MG/ML IJ SOLN
INTRAMUSCULAR | Status: AC
  Administered 2014-01-16: 50 ug
  Filled 2014-01-16: qty 4

## 2014-01-16 MED ORDER — BUPROPION HCL 75 MG PO TABS
75.0000 mg | ORAL_TABLET | ORAL | Status: DC
  Administered 2014-01-16 – 2014-01-17 (×2): 75 mg via NASOGASTRIC
  Filled 2014-01-16 (×4): qty 1

## 2014-01-16 NOTE — Consult Note (Signed)
PULMONARY / CRITICAL CARE MEDICINE   Name: Allen Wood MRN: [id number] DOB: 22-Nov-1970    ADMISSION DATE:  01/15/2014 CONSULTATION DATE:  01/16/14  REFERRING MD :  Dr. Grandville Silos   CHIEF COMPLAINT:  Acute Respiratory Failure   INITIAL PRESENTATION: 43 y/o M, smoker / ETOH abuse, admitted 12/21 with ongoing fatigue, cough with productive yellow sputum, R posterior back pain, chills & sweats.  Work up found patient to have RLL PNA with positive strep antigen.  Developed worsening respiratory failure 12/22  STUDIES:  12/21  CXR >> RLL consolidation, R pleural effusion   SIGNIFICANT EVENTS: 12/21  Admit with RLL PNA 12/22  Decompensated, required intubation for respiratory failure in setting of PNA   HISTORY OF PRESENT ILLNESS:  43 y/o M, smoker / ETOH abuse,with a PMH of anxiety, depression, oral surgery, recent PNA who presented to [facility] ER on 12/21 with ongoing fatigue, cough with productive yellow sputum, R posterior back pain, chills & sweats.  He recently had been seen by his PCP for ongoing fatigue after a recent PNA.  Work up notable for lactic acid 7.06,  Na 132, K 3.6, sr cr 0.51,  WBC 10.5, hgb 12.1, MCV 101.5, platelets 220.  CXR showed consolidation of the RLL with suspected pleural effusion.  Urine strep antigen was assess and positive.  The patient was admitted to SDU per [facility], treated with IV abx for CAP coverage, oxygen, nebulized bronchodilators and placed on CIWA protocol.  Unfortunately, he developed worsening respiratory failure 12/22 am requiring intubation.  PCCM consulted to assume care in ICU.      PAST MEDICAL HISTORY :   has a past medical history of Alcohol abuse; Depression; Anxiety; and Pneumonia.  has past surgical history that includes Mouth surgery.   HOME MEDICATIONS:  Prior to Admission medications   Medication Sig Start Date End Date Taking? Authorizing Provider  buPROPion (WELLBUTRIN XL) 150 MG 24 hr tablet Take 150 mg by mouth every morning.   Yes  Historical Provider, MD  sertraline (ZOLOFT) 50 MG tablet Take 50 mg by mouth at bedtime.   Yes Historical Provider, MD  azithromycin (ZITHROMAX) 500 MG tablet Take 1 tablet (500 mg total) by mouth daily. Patient not taking: Reported on 01/15/2014 09/15/13   [name], MD  predniSONE (DELTASONE) 10 MG tablet Takes 6 tablets for 1 days, then 5 tablets for 1 days, then 4 tablets for 1 days, then 3 tablets for 1 days, then 2 tabs for 1 days, then 1 tab for 1 days, and then stop. Patient not taking: Reported on 01/15/2014 09/15/13   [name], MD   No Known Allergies  FAMILY HISTORY:  has no family status information on file.  SOCIAL HISTORY:  reports that he has been smoking Cigarettes.  He has a 10 pack-year smoking history. He has never used smokeless tobacco. He reports that he drinks alcohol. He reports that he does not use illicit drugs.  REVIEW OF SYSTEMS:  Unable to complete due to respiratory distress.   SUBJECTIVE:   VITAL SIGNS: Temp:  [98.2 F (36.8 C)-100.7 F (38.2 C)] 98.4 F (36.9 C) (12/22 0343) Pulse Rate:  [76-156] 132 (12/22 0800) Resp:  [22-52] 39 (12/22 0800) BP: (135-186)/(71-137) 137/95 mmHg (12/22 0800) SpO2:  [94 %-99 %] 97 % (12/22 0800) FiO2 (%):  [100 %] 100 % (12/22 0900) Weight:  [152 lb 12.5 oz (69.3 kg)] 152 lb 12.5 oz (69.3 kg) (12/21 2011) HEMODYNAMICS:   VENTILATOR SETTINGS: Vent Mode:  [-]  PRVC FiO2 (%):  [100 %] 100 % Set Rate:  [20 bmp] 20 bmp Vt Set:  [600 mL] 600 mL PEEP:  [5 cmH20] 5 cmH20 Plateau Pressure:  [20 cmH20] 20 cmH20 INTAKE / OUTPUT:  Intake/Output Summary (Last 24 hours) at 01/16/14 8469 Last data filed at 01/16/14 6295  Gross per 24 hour  Intake 2012.5 ml  Output   1300 ml  Net  712.5 ml    PHYSICAL EXAMINATION: General:  Thin young adult male in NAD  Neuro:  Awake, alert, MAE HEENT:  Dry MM, poor dentition, limited speech due to resp distress  Cardiovascular:  s1s2 rrr, tachy, no m/r/g  Lungs:   resp's even/non-labored, diminished on R, left coarse rhonchi Abdomen:  NTND, bsx4 active  Musculoskeletal:  No acute distress  Skin:  Diaphoretic prior to intubation, warm  LABS:  CBC  Recent Labs Lab 01/15/14 1602 01/15/14 2136 01/16/14 0830  WBC 10.5 8.4 12.4*  HGB 12.1* 10.7* 10.0*  HCT 34.6* 30.2* 28.7*  PLT 220 177 173   Coag's No results for input(s): APTT, INR in the last 168 hours. BMET  Recent Labs Lab 01/15/14 1602 01/15/14 1958 01/16/14 0348  NA 132*  --  137  K 3.6*  --  3.2*  CL 85*  --  101  CO2 25  --  25  BUN 11  --  12  CREATININE 0.51 0.51 0.55  GLUCOSE 84  --  82   Electrolytes  Recent Labs Lab 01/15/14 1602 01/16/14 0348  CALCIUM 8.8 6.6*   Sepsis Markers  Recent Labs Lab 01/15/14 1614 01/15/14 1807  LATICACIDVEN 7.06* 5.17*   ABG  Recent Labs Lab 01/15/14 1930  PHART 7.429  PCO2ART 35.3  PO2ART 79.2*   Liver Enzymes  Recent Labs Lab 01/15/14 1602 01/16/14 0348  AST 50* 87*  ALT 24 29  ALKPHOS 114 91  BILITOT 0.7 0.5  ALBUMIN 2.3* 2.0*   Cardiac Enzymes No results for input(s): TROPONINI, PROBNP in the last 168 hours. Glucose No results for input(s): GLUCAP in the last 168 hours.  Imaging Dg Chest 2 View (if Patient Has Fever And/or Copd)  01/15/2014   CLINICAL DATA:  Pneumonia. Emphysema. Productive sputum. Shortness of breath. Right chest pain.  EXAM: CHEST  2 VIEW  COMPARISON:  11/03/2013  FINDINGS: Consolidation of a large portion of the right lower lobe. Suspected right pleural effusion. The left lung appears clear. Cardiac and mediastinal margins appear normal.  IMPRESSION: 1. Consolidation of much of the right lower lobe along with a suspected right pleural effusion. Followup chest radiography is recommended in 4 weeks time to ensure resolution and exclude underlying malignancy.   Electronically Signed   By: Sherryl Barters M.D.   On: 01/15/2014 16:59     ASSESSMENT / PLAN:  PULMONARY OETT 12/22 >>   A: Acute Hypoxic Respiratory Failure - in setting of RLL PNA RLL PNA - with positive strep antigen R Pleural Effusion  Tobacco Abuse P:   IMV, 8cc/kg Follow up ABG in one hour post CXR now to confirm ETT placement  Xopenex/Atrovent Q6 + Q3 PRN xopenex  Assess R pleural space with [place] Monitor PaO2/FiO2 ratio See ID   CARDIOVASCULAR CVL L IJ TLC 12/22 >>  A:  Sepsis - in setting of PNA  Tachycardia  P:  NS bolus x1 now Assess CPV Q4 Levophed to support MAP > 65 if needed  PRN Labetalol for SBP > 170 EKG in am   RENAL A:  Hypokalemia  Hypomagnesemia  P:   Trend BMP Replace electrolytes as indicated K/Mg 12/21  GASTROINTESTINAL A:   Protein Calorie Malnutrition - albumin 2.0 Vent Associated Dysphagia  P:   Insert OGT Begin TF per nutrition rec's  PPI  HEMATOLOGIC A:   Macrocytic Anemia - in setting of ETOH abuse P:  Thiamine / Folate / MVI Trend CBC Heparin for DVT prophylaxis   INFECTIOUS A:   RLL PNA - strep antigen positive  R/O infected pleural space  P:   BCx2 12/21 >>  UC  12/21 >>  Sputum 12/21 >>   Abx: Rocephin, start date 12/21, day 2/x Abx: Azithro, start date 12/21, day 2/x  PCT protocol   ENDOCRINE A:   No acute issues  P:   Monitor glucose on BMP  NEUROLOGIC A:   ETOH Abuse - at risk withdrawal Anxiety / Depression  P:   RASS goal: -1 Consider early addition of oral agents to reduce IV sedation need, early assessment appears he will be difficult to sedate Fentanyl gtt for pain  PRN versed for sedation  Continue wellbutrin, zoloft    FAMILY  - Updates: Wife updated over phone.     Noe Gens, NP-C [facility] Pgr: [phone] or [phone]   01/16/2014, 9:18 AM  Attending:  I have seen and examined the patient with nurse practitioner/resident and agree with the note above.   On my exam he was in marked respiratory distress so we intubated him.  Post intubation he has rhonchi bilaterally  and is hemodynamically stable. CXR, history, chart and labs reviewed  Impression: 1) severe CAP> favor strep pneumo based on urine antigen 2) Acute respiratory failure with hypoxemia due to CAP 3) EtOH withdrawal 4) Severe sepsis  Plan: 1) mechanical ventilation 2) place CVL 3) change antibiotics to ceftriaxone/azithro, may peel back azithro 4) resp culture  My cc time 45 minutes  Roselie Awkward, MD [place] [facility] Pager: [phone] Cell: [phone] If no response, call [phone]

## 2014-01-16 NOTE — Procedures (Signed)
Intubation Procedure Note Allen Wood [id number] 26-Feb-1970  Procedure: Intubation Indications: Respiratory insufficiency  Procedure Details Consent: Unable to obtain consent because of emergent medical necessity. Time Out: Verified patient identification, verified procedure, site/side was marked, verified correct patient position, special equipment/implants available, medications/allergies/relevent history reviewed, required imaging and test results available.  Performed  Drugs Etomidate 20mg , Rocuronium 70mg , Versed 2mg , Etomidate 50mg  DL x 1 with MAC 4 blade Grade 1 view 8.0 tube passed through cords under direct visualization Placement confirmed with bilateral breath sounds, positive EtCO2 change and smoke in tube   Evaluation Hemodynamic Status: BP stable throughout; O2 sats: stable throughout Patient's Current Condition: stable Complications: No apparent complications Patient did tolerate procedure well. Chest X-ray ordered to verify placement.  CXR: pending.   Allen Wood 01/16/2014

## 2014-01-16 NOTE — Procedures (Signed)
Central Venous Catheter Insertion Procedure Note Allen Wood [id number] 1970/04/19  Procedure: Insertion of Central Venous Catheter Indications: Assessment of intravascular volume, Drug and/or fluid administration and Frequent blood sampling  Procedure Details Consent: Risks of procedure as well as the alternatives and risks of each were explained to the (patient/caregiver).  Consent for procedure obtained.   Time Out: Verified patient identification, verified procedure, site/side was marked, verified correct patient position, special equipment/implants available, medications/allergies/relevent history reviewed, required imaging and test results available.  Performed  Maximum sterile technique was used including antiseptics, cap, gloves, gown, hand hygiene, mask and sheet. Skin prep: Chlorhexidine; local anesthetic administered A antimicrobial bonded/coated triple lumen catheter was placed in the left internal jugular vein using the Seldinger technique. Line placed to 20 cm, sutured x 2 (at hub).  Evaluation Blood flow good Complications: No apparent complications Patient did tolerate procedure well. Chest X-ray ordered to verify placement.  CXR: pending.    Procedure performed under direct supervision of Dr. Lake Bells and with ultrasound guidance for real time vessel cannulation.      Noe Gens, NP-C [facility] [facility] Pgr: [phone] or [phone]   01/16/2014, 9:15 AM  Attending:  I was present for and supervised the procedure  Roselie Awkward, MD [place] [facility] Pager: [phone] Cell: [phone] If no response, call [phone]

## 2014-01-16 NOTE — Progress Notes (Signed)
UA and Urine culture ordered.  No urine output to collect.

## 2014-01-16 NOTE — Progress Notes (Addendum)
[facility] PROGRESS NOTE  Allen Wood BPZ:[id number] DOB: 27-Aug-1970 DOA: 01/15/2014 PCP: [facility]  Assessment/Plan: #1 acute respiratory distress Secondary to impressive right-sided community-acquired pneumonia secondary to Streptococcus. Patient using accessory muscles of respiration. Increased respiratory rate. Tachycardic. Urine strep pneumonia was positive. Urine Legionella antigen is pending. Patient on 6 liters nasal cannula. Patient still in significant respiratory distress. Patient unable to tolerate BiPAP overnight. Blood cultures pending. Check a lactic acid level. Check a pro calcitonin level. Check a CBC with differential. Continue oxygen. Discontinue IV Rocephin and broaden antibiotics. Start on IV vancomycin and IV cefepime. Continue IV azithromycin. Place on Mucinex. Place on scheduled nebulizers of Xopenex and Atrovent. Check ABG. Patient likely needs to be intubated. We'll consult the critical care for further evaluation and management.  #2 sepsis Patient meets criteria for sepsis as he came in tachycardic heart rate in the 130s, tachypneic with respiratory rate currently in the 40s, lactic acid level on admission was 7.06. Repeat lactic acid level. Check a pro-calcitonin. Blood cultures are pending. Urine cultures are pending. Urine strep pneumonia was positive. Urine Legionella antigen is pending. Broaden antibiotic coverage to IV vancomycin, IV cefepime, IV azithromycin. Consult with critical care for further evaluation and management.  #3 community-acquired pneumonia Patient with an impressive right lobe pneumonia. Urine Streptococcus pneumonia was positive. Urine Legionella antigen is pending. Patient in acute respiratory distress. Check a lactic acid level. Check a pro calcitonin level. Patient unable to tolerate BiPAP. Check ABG. Check a CBC with differential. Continue oxygen. Add Mucinex. Place on scheduled nebulizers. Broaden antibiotic  coverage to IV vancomycin, IV cefepime, IV azithromycin. We'll consult with critical care medicine as patient likely needs to be intubated. Follow.  #4 probable right-sided pleural effusion Concern for possible parapneumonic effusion. Continue empiric IV antibiotics. Blood cultures are pending. Consult with critical care for further evaluation and management. If no significant improvement pleural effusion might need to be drained.   #5 hypokalemia Replete.  #6 hypocalcemia Corrected calcium is 8.2. Will give a dose of IV calcium gluconate as patient is tachycardic. Follow.  #7 anemia No signs of bleeding. Check an anemia panel. Follow H&H.  #8 pulmonary nodule Will likely need outpatient follow-up.  #9 tobacco abuse Tobacco cessation. Continue nicotine patch.  #10 history of depression Continue Wellbutrin.  #11 tachycardia Check a EKG. Likely secondary to problem #1,2 and 3. Check a magnesium level. Follow. Replete potassium.  #12 history of Alcohol Abuse Continue the Ativan withdrawal protocol.   #13 prophylaxis PPI for GI prophylaxis. Heparin for DVT prophylaxis.  Code Status: Full Family Communication: Updated patient no family present. Disposition Plan: Transfer to ICU   Consultants:  None  Procedures:  Chest x-ray 01/15/2014, 01/16/2014  Antibiotics:  IV Rocephin 01/15/2014>>> 01/16/2014  IV azithromycin 01/15/2014  IV vancomycin 01/16/2014  IV cefepime 01/16/2014  HPI/Subjective: Patient using accessory muscles of respiration. Not speaking in full sentences. States feels a little bit better than on presentation. Unable to tolerate BiPAP overnight on several occasions.  Objective: Filed Vitals:   01/16/14 0800  BP: 137/95  Pulse: 132  Temp:   Resp: 39    Intake/Output Summary (Last 24 hours) at 01/16/14 0835 Last data filed at 01/16/14 0641  Gross per 24 hour  Intake 2012.5 ml  Output   1300 ml  Net  712.5 ml   Filed Weights   01/15/14  2011  Weight: 69.3 kg (152 lb 12.5 oz)    Exam:   General:  Acute  respiratory distress. Use of accessory muscles of respiration. Thoracoabdominal breathing.  Cardiovascular: Tachycardic  Respiratory: Coarse rhonchorous breath sounds right greater than left. No wheezing. Poor air movement. Use of accessory muscles of respiration. Thoracoabdominal breathing area  Abdomen: Soft, nontender, nondistended, positive bowel sounds.  Musculoskeletal: No clubbing cyanosis or edema.  Data Reviewed: Basic Metabolic Panel:  Recent Labs Lab 01/15/14 1602 01/15/14 1958 01/16/14 0348  NA 132*  --  137  K 3.6*  --  3.2*  CL 85*  --  101  CO2 25  --  25  GLUCOSE 84  --  82  BUN 11  --  12  CREATININE 0.51 0.51 0.55  CALCIUM 8.8  --  6.6*   Liver Function Tests:  Recent Labs Lab 01/15/14 1602 01/16/14 0348  AST 50* 87*  ALT 24 29  ALKPHOS 114 91  BILITOT 0.7 0.5  PROT 6.5 4.9*  ALBUMIN 2.3* 2.0*   No results for input(s): LIPASE, AMYLASE in the last 168 hours. No results for input(s): AMMONIA in the last 168 hours. CBC:  Recent Labs Lab 01/15/14 1602 01/15/14 2136  WBC 10.5 8.4  NEUTROABS  --  7.9*  HGB 12.1* 10.7*  HCT 34.6* 30.2*  MCV 101.5* 101.3*  PLT 220 177   Cardiac Enzymes: No results for input(s): CKTOTAL, CKMB, CKMBINDEX, TROPONINI in the last 168 hours. BNP (last 3 results)  Recent Labs  09/14/13 0920  PROBNP 11.9   CBG: No results for input(s): GLUCAP in the last 168 hours.  Recent Results (from the past 240 hour(s))  MRSA PCR Screening     Status: None   Collection Time: 01/15/14  8:24 PM  Result Value Ref Range Status   MRSA by PCR NEGATIVE NEGATIVE Final    Comment:        The GeneXpert MRSA Assay (FDA approved for NASAL specimens only), is one component of a comprehensive MRSA colonization surveillance program. It is not intended to diagnose MRSA infection nor to guide or monitor treatment for MRSA infections.   Culture,  sputum-assessment     Status: None   Collection Time: 01/15/14  8:40 PM  Result Value Ref Range Status   Specimen Description SPUTUM  Final   Special Requests NONE  Final   Sputum evaluation   Final    THIS SPECIMEN IS ACCEPTABLE. RESPIRATORY CULTURE REPORT TO FOLLOW.   Report Status 01/15/2014 FINAL  Final     Studies: Dg Chest 2 View (if Patient Has Fever And/or Copd)  01/15/2014   CLINICAL DATA:  Pneumonia. Emphysema. Productive sputum. Shortness of breath. Right chest pain.  EXAM: CHEST  2 VIEW  COMPARISON:  11/03/2013  FINDINGS: Consolidation of a large portion of the right lower lobe. Suspected right pleural effusion. The left lung appears clear. Cardiac and mediastinal margins appear normal.  IMPRESSION: 1. Consolidation of much of the right lower lobe along with a suspected right pleural effusion. Followup chest radiography is recommended in 4 weeks time to ensure resolution and exclude underlying malignancy.   Electronically Signed   By: Sherryl Barters M.D.   On: 01/15/2014 16:59    Scheduled Meds: . antiseptic oral rinse  7 mL Mouth Rinse QID  . azithromycin (ZITHROMAX) 500 MG IVPB  500 mg Intravenous Q24H  . buPROPion  150 mg Oral Daily  . ceFEPime (MAXIPIME) IV  1 g Intravenous Q8H  . chlorhexidine  15 mL Mouth Rinse BID  . feeding supplement (ENSURE COMPLETE)  237 mL Oral BID BM  .  folic acid  1 mg Oral Daily  . guaiFENesin  1,200 mg Oral BID  . heparin  5,000 Units Subcutaneous 3 times per day  . ipratropium  0.5 mg Nebulization Q6H  . labetalol      . levalbuterol  0.63 mg Nebulization Q6H  . LORazepam  0-4 mg Intravenous 4 times per day   Followed by  . [START ON 01/17/2014] LORazepam  0-4 mg Intravenous Q12H  . LORazepam  0-4 mg Oral 4 times per day   Followed by  . [START ON 01/17/2014] LORazepam  0-4 mg Oral Q12H  . multivitamin with minerals  1 tablet Oral Daily  . nicotine  14 mg Transdermal Daily  . potassium chloride  40 mEq Oral Q4H  . sertraline  50 mg  Oral QHS  . sodium chloride  1,000 mL Intravenous Once  . thiamine  100 mg Oral Daily   Or  . thiamine  100 mg Intravenous Daily  . vancomycin  1,000 mg Intravenous Q8H   Continuous Infusions: . sodium chloride 150 mL/hr at 01/16/14 0539  . norepinephrine (LEVOPHED) Adult infusion      Principal Problem:   Acute respiratory failure Active Problems:   Sepsis   CAP (community acquired pneumonia)   Nicotine abuse   EtOH dependence   Fatigue   Dehydration   Anemia   Tachycardia   Solitary pulmonary nodule   Pleural effusion    Time spent: 76 mins    [facility] MD [facility] Pager [phone]. If 7PM-7AM, please contact night-coverage at [email or web address], password [facility] 01/16/2014, 8:35 AM  LOS: 1 day

## 2014-01-16 NOTE — Progress Notes (Signed)
ANTIBIOTIC CONSULT NOTE - INITIAL  Pharmacy Consult for Vancomycin, Cefepime Indication: pneumonia  No Known Allergies  Patient Measurements: Height:  (71) Weight: 152 lb 12.5 oz (69.3 kg) IBW/kg (Calculated) : 75.3 Adjusted Body Weight: n/a  Vital Signs: Temp: 98.4 F (36.9 C) (12/22 0343) Temp Source: Oral (12/22 0343) BP: 140/87 mmHg (12/22 0617) Pulse Rate: 128 (12/22 0530) Intake/Output from previous day: 12/21 0701 - 12/22 0700 In: 3012.5 [P.O.:550; I.V.:1462.5; IV Piggyback:1000] Out: 1300 [Urine:1300] Intake/Output from this shift:    Labs:  Recent Labs  01/15/14 1602 01/15/14 1958 01/15/14 2136 01/16/14 0348  WBC 10.5  --  8.4  --   HGB 12.1*  --  10.7*  --   PLT 220  --  177  --   CREATININE 0.51 0.51  --  0.55   Estimated Creatinine Clearance: 114.3 mL/min (by C-G formula based on Cr of 0.55). No results for input(s): VANCOTROUGH, VANCOPEAK, VANCORANDOM, GENTTROUGH, GENTPEAK, GENTRANDOM, TOBRATROUGH, TOBRAPEAK, TOBRARND, AMIKACINPEAK, AMIKACINTROU, AMIKACIN in the last 72 hours.   Microbiology: Recent Results (from the past 720 hour(s))  MRSA PCR Screening     Status: None   Collection Time: 01/15/14  8:24 PM  Result Value Ref Range Status   MRSA by PCR NEGATIVE NEGATIVE Final    Comment:        The GeneXpert MRSA Assay (FDA approved for NASAL specimens only), is one component of a comprehensive MRSA colonization surveillance program. It is not intended to diagnose MRSA infection nor to guide or monitor treatment for MRSA infections.   Culture, sputum-assessment     Status: None   Collection Time: 01/15/14  8:40 PM  Result Value Ref Range Status   Specimen Description SPUTUM  Final   Special Requests NONE  Final   Sputum evaluation   Final    THIS SPECIMEN IS ACCEPTABLE. RESPIRATORY CULTURE REPORT TO FOLLOW.   Report Status 01/15/2014 FINAL  Final    Medical History: Past Medical History  Diagnosis Date  . Alcohol abuse   .  Depression   . Anxiety   . Pneumonia     Medications:  Infusions:  . sodium chloride 150 mL/hr at 01/16/14 0539   Anti-infectives    Start     Dose/Rate Route Frequency Ordered Stop   01/16/14 1900  azithromycin (ZITHROMAX) 500 mg in dextrose 5 % 250 mL IVPB  Status:  Discontinued     500 mg250 mL/hr over 60 Minutes Intravenous Every 24 hours 01/15/14 1949 01/15/14 2036   01/16/14 1900  cefTRIAXone (ROCEPHIN) 1 g in dextrose 5 % 50 mL IVPB  Status:  Discontinued     1 g100 mL/hr over 30 Minutes Intravenous  Once 01/15/14 1949 01/15/14 2039   01/16/14 1800  azithromycin (ZITHROMAX) 500 mg in dextrose 5 % 250 mL IVPB     500 mg250 mL/hr over 60 Minutes Intravenous Every 24 hours 01/15/14 2036     01/16/14 1600  cefTRIAXone (ROCEPHIN) 1 g in dextrose 5 % 50 mL IVPB  Status:  Discontinued     1 g100 mL/hr over 30 Minutes Intravenous Every 24 hours 01/15/14 2039 01/16/14 0728   01/15/14 1830  cefTRIAXone (ROCEPHIN) 1 g in dextrose 5 % 50 mL IVPB     1 g100 mL/hr over 30 Minutes Intravenous  Once 01/15/14 1815 01/15/14 1850   01/15/14 1830  azithromycin (ZITHROMAX) 500 mg in dextrose 5 % 250 mL IVPB     500 mg250 mL/hr over 60 Minutes Intravenous  Once 01/15/14  1815 01/15/14 1943     Assessment 43yoM w/ productive cough x 4 days, endorses chills/sweating, now SOB.  Started on ceftriaxone/azithromycin in hospital w/o improvement; Rx consulted to dose vanc/cefepime.  CXR shows large RLL consolidation + pleural effusion.  PMH emphysema, EtOH abuse/withdrawal, on CIWA.  Noted tachycardia in ED.  12/21 >> ceftriaxone  >> 12/22 12/21 >> azithromycin  >>   12/22 >> vancomycin >> 12/22 >> cefepime >>  Tmax: 100.7 overnight, now afebrile WBC: wnl Renal function intact, CrCl > 100 ml/min SpO2: ok on 6L [place]  12/21 blood: IP 12/21 sputum: IP 12/21 Legionella UAg: negative 12/21 Strep Pneumo UAg: POSITIVE 12/21 flu panel: IP 12/21 MRSA swab: negative 12/21 HIV Ab: IP   Goal of Therapy:    Vancomycin trough level 15-20 mcg/ml   Eradication of infection  Appropriate antibiotic dosing for indication and renal function  Plan:   Begin vancomycin 1g IV q8h  Begin cefepime 1g IV q8h  Will continue azithromycin for now per discussion with MD due to severity of illness, likely stop tomorrow  Measure vancomycin trough levels at steady state as indicated  Follow clinical course, culture results as available, renal function  Follow for de-escalation of antibiotics and LOT   Reuel Boom, PharmD Pager: [phone] 01/16/2014, 8:18 AM

## 2014-01-16 NOTE — Progress Notes (Signed)
CRITICAL VALUE ALERT  Critical value received:  Positive blood cultures, all four bottles, Gram positive cocci in pairs  Date of notification:  01-16-2014  Time of notification:  7341  Critical value read back:  yes  Nurse who received alert:  Jerrel Ivory, RN  MD notified (1st page):  Dr. Melvyn Novas  Time of first page:  N/A  MD notified (2nd page): N/A  Time of second page:  Responding MD:  Dr. Melvyn Novas  Time MD responded:  [phone]

## 2014-01-16 NOTE — Progress Notes (Signed)
Notified Dr. Avelina Laine, of elevated heart rate, and elevated blood pressure, respiratory status new orders noted and received.

## 2014-01-16 NOTE — Procedures (Signed)
Intubation Procedure Note Allen Wood [id number] [birth year]  Procedure: Intubation Indications: Respiratory insufficiency  Procedure Details Consent: Risks of procedure as well as the alternatives and risks of each were explained to the (patient/caregiver).  Consent for procedure obtained. Time Out: Verified patient identification, verified procedure, site/side was marked, verified correct patient position, special equipment/implants available, medications/allergies/relevent history reviewed, required imaging and test results available.  Performed  Maximum sterile technique was used including antiseptics.  MAC    Evaluation Hemodynamic Status: BP stable throughout; O2 sats: stable throughout Patient's Current Condition: stable Complications: No apparent complications Patient did tolerate procedure well. Chest X-ray ordered to verify placement.  CXR: tube position acceptable.   Allen Wood Allen Wood 01/16/2014

## 2014-01-16 NOTE — Progress Notes (Signed)
Temperature 102.4 orally Bladder scan result 86 ml.  Scanned due to no urine output since 0915 am.   Results called to Elink/Dr. Melvyn Novas.

## 2014-01-16 NOTE — Progress Notes (Signed)
RT tried patient on BIPAP again and patient is still unable to tolerate. RT to continue to assess and monitor.

## 2014-01-16 NOTE — Progress Notes (Signed)
INITIAL NUTRITION ASSESSMENT  DOCUMENTATION CODES Per approved criteria  -Not Applicable   INTERVENTION: Initiate Vital AF 1.2 @ 20 ml/hr via OGT and increase by 10 ml every 4 hours to goal rate of 70 ml/hr.   Tube feeding regimen provides 2016 kcal (101% of needs), 126 grams of protein, and 1361 ml of H2O.   NUTRITION DIAGNOSIS: Inadequate oral intake related to inability to eat as evidenced by NPO.   Goal: Pt to meet >/= 90% of their estimated nutrition needs   Monitor:  Weight trend, NPO, vent status/settings, labs, TF initiation and tolerance  Reason for Assessment: Allen Wood  43 y.o. male  Admitting Dx: Acute respiratory failure  ASSESSMENT: 43 y/o M, smoker / ETOH abuse, admitted 12/21 with ongoing fatigue, cough with productive yellow sputum, R posterior back pain, chills & sweats. Work up found patient to have RLL PNA with positive strep antigen. Developed worsening respiratory failure 12/22  Patient is currently intubated on ventilator support MV: 9.5 L/min Temp (24hrs), Avg:98.9 F (37.2 C), Min:98.2 F (36.8 C), Max:100.7 F (38.2 C)  - Pt with no signs of fat or muscle depletion.  Labs: Mg Low Na WNL K low BUN WNL  Height: Ht Readings from Last 1 Encounters:  01/15/14 5\' 11"  (1.803 m)    Weight: Wt Readings from Last 1 Encounters:  01/15/14 152 lb 12.5 oz (69.3 kg)    Ideal Body Weight: 75.3 kg  % Ideal Body Weight: 92%  Wt Readings from Last 10 Encounters:  01/15/14 152 lb 12.5 oz (69.3 kg)  09/14/13 144 lb 14.4 oz (65.726 kg)    BMI:  Cannot calculate BMI with a height equal to zero.  Estimated Nutritional Needs: Kcal: 1996 Protein: 115-125 g Fluid: Per MD  Skin: Intact  Diet Order: Diet NPO time specified  EDUCATION NEEDS: -Education needs addressed   Intake/Output Summary (Last 24 hours) at 01/16/14 1052 Last data filed at 01/16/14 0900  Gross per 24 hour  Intake 3012.5 ml  Output   1300 ml  Net 1712.5 ml    Last BM:  prior to admission   Labs:   Recent Labs Lab 01/15/14 1602 01/15/14 1958 01/16/14 0348 01/16/14 0830  NA 132*  --  137  --   K 3.6*  --  3.2*  --   CL 85*  --  101  --   CO2 25  --  25  --   BUN 11  --  12  --   CREATININE 0.51 0.51 0.55  --   CALCIUM 8.8  --  6.6*  --   MG  --   --   --  1.1*  GLUCOSE 84  --  82  --     CBG (last 3)  No results for input(s): GLUCAP in the last 72 hours.  Scheduled Meds: . antiseptic oral rinse  7 mL Mouth Rinse QID  . azithromycin (ZITHROMAX) 500 MG IVPB  500 mg Intravenous Q24H  . buPROPion  150 mg Oral Daily  . calcium gluconate  1 g Intravenous Once  . cefTRIAXone (ROCEPHIN)  IV  2 g Intravenous Q24H  . chlorhexidine  15 mL Mouth Rinse BID  . feeding supplement (VITAL HIGH PROTEIN)  1,000 mL Per Tube Q24H  . folic acid  1 mg Oral Daily  . guaiFENesin  1,200 mg Oral BID  . heparin  5,000 Units Subcutaneous 3 times per day  . ipratropium  0.5 mg Nebulization Q6H  . levalbuterol  0.63 mg  Nebulization Q6H  . lidocaine (cardiac) 100 mg/50ml      . magnesium sulfate 1 - 4 g bolus IVPB  2 g Intravenous Once  . multivitamin with minerals  1 tablet Oral Daily  . nicotine  14 mg Transdermal Daily  . pantoprazole (PROTONIX) IV  40 mg Intravenous Q24H  . potassium chloride  10 mEq Intravenous Q1 Hr x 4  . potassium chloride  40 mEq Per Tube Once  . sertraline  50 mg Oral QHS  . succinylcholine      . thiamine  100 mg Oral Daily   Or  . thiamine  100 mg Intravenous Daily    Continuous Infusions: . sodium chloride 150 mL/hr at 01/16/14 0539  . dexmedetomidine    . fentaNYL infusion INTRAVENOUS 100 mcg/hr (01/16/14 0947)  . norepinephrine (LEVOPHED) Adult infusion      Past Medical History  Diagnosis Date  . Alcohol abuse   . Depression   . Anxiety   . Pneumonia     Past Surgical History  Procedure Laterality Date  . Mouth surgery      teeth removed.    Laurette Schimke MS, RD, LDN

## 2014-01-17 ENCOUNTER — Inpatient Hospital Stay (HOSPITAL_COMMUNITY): Payer: Medicaid Other

## 2014-01-17 DIAGNOSIS — R911 Solitary pulmonary nodule: Secondary | ICD-10-CM

## 2014-01-17 DIAGNOSIS — J9601 Acute respiratory failure with hypoxia: Secondary | ICD-10-CM

## 2014-01-17 DIAGNOSIS — F10231 Alcohol dependence with withdrawal delirium: Secondary | ICD-10-CM

## 2014-01-17 LAB — CBC WITH DIFFERENTIAL/PLATELET
Basophils Absolute: 0 10*3/uL (ref 0.0–0.1)
Basophils Relative: 0 % (ref 0–1)
Eosinophils Absolute: 0 10*3/uL (ref 0.0–0.7)
Eosinophils Relative: 0 % (ref 0–5)
HCT: 29.2 % — ABNORMAL LOW (ref 39.0–52.0)
Hemoglobin: 9.9 g/dL — ABNORMAL LOW (ref 13.0–17.0)
LYMPHS ABS: 0.6 10*3/uL — AB (ref 0.7–4.0)
Lymphocytes Relative: 6 % — ABNORMAL LOW (ref 12–46)
MCH: 36.1 pg — AB (ref 26.0–34.0)
MCHC: 33.9 g/dL (ref 30.0–36.0)
MCV: 106.6 fL — AB (ref 78.0–100.0)
MONOS PCT: 3 % (ref 3–12)
Monocytes Absolute: 0.3 10*3/uL (ref 0.1–1.0)
NEUTROS ABS: 9.1 10*3/uL — AB (ref 1.7–7.7)
Neutrophils Relative %: 91 % — ABNORMAL HIGH (ref 43–77)
Platelets: ADEQUATE 10*3/uL (ref 150–400)
RBC: 2.74 MIL/uL — AB (ref 4.22–5.81)
RDW: 13 % (ref 11.5–15.5)
WBC: 10 10*3/uL (ref 4.0–10.5)

## 2014-01-17 LAB — COMPREHENSIVE METABOLIC PANEL
ALBUMIN: 1.7 g/dL — AB (ref 3.5–5.2)
ALT: 20 U/L (ref 0–53)
AST: 44 U/L — AB (ref 0–37)
Alkaline Phosphatase: 93 U/L (ref 39–117)
Anion gap: 7 (ref 5–15)
BILIRUBIN TOTAL: 0.5 mg/dL (ref 0.3–1.2)
BUN: 18 mg/dL (ref 6–23)
CHLORIDE: 103 meq/L (ref 96–112)
CO2: 26 mmol/L (ref 19–32)
Calcium: 6.2 mg/dL — CL (ref 8.4–10.5)
Creatinine, Ser: 0.63 mg/dL (ref 0.50–1.35)
GFR calc Af Amer: >90 mL/min (ref >90)
GFR calc non Af Amer: >90 mL/min (ref >90)
Glucose, Bld: 100 mg/dL — ABNORMAL HIGH (ref 70–99)
Potassium: 3.3 mmol/L — ABNORMAL LOW (ref 3.5–5.1)
SODIUM: 136 mmol/L (ref 135–145)
Total Protein: 4.8 g/dL — ABNORMAL LOW (ref 6.0–8.3)

## 2014-01-17 LAB — URINALYSIS, ROUTINE W REFLEX MICROSCOPIC
BILIRUBIN URINE: NEGATIVE
Glucose, UA: NEGATIVE mg/dL
Ketones, ur: NEGATIVE mg/dL
Leukocytes, UA: NEGATIVE
Nitrite: NEGATIVE
PROTEIN: 30 mg/dL — AB
Specific Gravity, Urine: 1.023 (ref 1.005–1.030)
UROBILINOGEN UA: 1 mg/dL (ref 0.0–1.0)
pH: 6 (ref 5.0–8.0)

## 2014-01-17 LAB — GLUCOSE, CAPILLARY
GLUCOSE-CAPILLARY: 101 mg/dL — AB (ref 70–99)
GLUCOSE-CAPILLARY: 109 mg/dL — AB (ref 70–99)
GLUCOSE-CAPILLARY: 111 mg/dL — AB (ref 70–99)
GLUCOSE-CAPILLARY: 120 mg/dL — AB (ref 70–99)
GLUCOSE-CAPILLARY: 96 mg/dL (ref 70–99)
Glucose-Capillary: 104 mg/dL — ABNORMAL HIGH (ref 70–99)
Glucose-Capillary: 113 mg/dL — ABNORMAL HIGH (ref 70–99)

## 2014-01-17 LAB — URINE MICROSCOPIC-ADD ON

## 2014-01-17 LAB — MAGNESIUM: Magnesium: 1.8 mg/dL (ref 1.5–2.5)

## 2014-01-17 LAB — PROCALCITONIN: PROCALCITONIN: 18.52 ng/mL

## 2014-01-17 MED ORDER — VITAL AF 1.2 CAL PO LIQD
1000.0000 mL | ORAL | Status: DC
  Administered 2014-01-17 – 2014-01-18 (×2): 1000 mL
  Filled 2014-01-17 (×5): qty 1000

## 2014-01-17 MED ORDER — METOCLOPRAMIDE HCL 5 MG/ML IJ SOLN
10.0000 mg | Freq: Three times a day (TID) | INTRAMUSCULAR | Status: AC
Start: 2014-01-18 — End: 2014-01-19
  Administered 2014-01-18 – 2014-01-19 (×6): 10 mg via INTRAVENOUS
  Filled 2014-01-17 (×6): qty 2

## 2014-01-17 MED ORDER — POTASSIUM CHLORIDE 20 MEQ/15ML (10%) PO SOLN
40.0000 meq | Freq: Two times a day (BID) | ORAL | Status: AC
  Administered 2014-01-17 – 2014-01-18 (×2): 40 meq
  Filled 2014-01-17 (×2): qty 30

## 2014-01-17 MED ORDER — ACETAMINOPHEN 160 MG/5ML PO SOLN
650.0000 mg | Freq: Four times a day (QID) | ORAL | Status: DC | PRN
  Administered 2014-01-17 – 2014-01-30 (×6): 650 mg
  Filled 2014-01-17 (×6): qty 20.3

## 2014-01-17 NOTE — Progress Notes (Signed)
Fentanyl from bag wasted in sink: 7 mL. Witnessed by Duard Larsen, RN.

## 2014-01-17 NOTE — Progress Notes (Signed)
[facility] Progress Note Patient Name: Cornelious Bartolucci DOB: February 21, 1970 MRN: [id number]   Date of Service  01/17/2014  HPI/Events of Note  Patient noted to have abdominal distention and RN concerned about tube feeds coming out of his mouth.   eICU Interventions  Will have tube feeds held.  Will get abdominal xray.         Pollyann Roa 01/17/2014, 8:48 PM

## 2014-01-17 NOTE — Progress Notes (Signed)
NUTRITION FOLLOW UP  Intervention:   Increase Vital AF 1.2 @ 60 ml/hr via OGT and increase by 10 ml every 4 hours to goal rate of 80 ml/hr.   Tube feeding regimen provides 2304 kcal (101% of needs), 144 grams of protein, and 1555 ml of H2O.   Nutrition Dx:   Inadequate oral intake related to inability to eat as evidenced by NPO; ongoing  Goal:   Pt to meet >/= 90% of their estimated nutrition needs; not met  Monitor:   Weight trend, NPO, vent status/settings, labs, TF initiation and tolerance  Assessment:   43 y/o M, smoker / ETOH abuse, admitted 12/21 with ongoing fatigue, cough with productive yellow sputum, R posterior back pain, chills & sweats. Work up found patient to have RLL PNA with positive strep antigen. Developed worsening respiratory failure 12/22  Pt with TF currently running at 60 mL/hour. Providing 1728 kcal and 108 g protein. Per RN, pt is tolerating TF with minimal residuals. TF goal rate increased due to pt having high fevers. Receiving Tylenol.   Patient is currently intubated on ventilator support MV: 9.7 L/min Temp (24hrs), Avg:103.1 F (39.5 C), Min:99.9 F (37.7 C), Max:104.5 F (40.3 C)   Height: Ht Readings from Last 1 Encounters:  01/15/14 $RemoveB'5\' 11"'[id number]$  (1.803 m)    Weight Status:   Wt Readings from Last 1 Encounters:  01/17/14 166 lb 0.1 oz (75.3 kg)    Re-estimated needs:  Kcal: 2280 Protein: 130-145 g Fluid: Per MD  Skin: Intact  Diet Order: Diet NPO time specified   Intake/Output Summary (Last 24 hours) at 01/17/14 0939 Last data filed at 01/17/14 0600  Gross per 24 hour  Intake 4011.7 ml  Output   1225 ml  Net 2786.7 ml    Last BM: prior to admission   Labs:   Recent Labs Lab 01/15/14 1602 01/15/14 1958 01/16/14 0348 01/16/14 0830 01/17/14 0540  NA 132*  --  137  --  136  K 3.6*  --  3.2*  --  3.3*  CL 85*  --  101  --  103  CO2 25  --  25  --  26  BUN 11  --  12  --  18  CREATININE 0.51 0.51 0.55  --  0.63   CALCIUM 8.8  --  6.6*  --  6.2*  MG  --   --   --  1.1* 1.8  GLUCOSE 84  --  82  --  100*    CBG (last 3)   Recent Labs  01/16/14 2317 01/17/14 0330 01/17/14 0811  GLUCAP 117* 104* 109*    Scheduled Meds: . antiseptic oral rinse  7 mL Mouth Rinse QID  . azithromycin (ZITHROMAX) 500 MG IVPB  500 mg Intravenous Q24H  . buPROPion  75 mg Per NG tube 2 times per day  . cefTRIAXone (ROCEPHIN)  IV  2 g Intravenous Q24H  . chlorhexidine  15 mL Mouth Rinse BID  . folic acid  1 mg Oral Daily  . guaiFENesin  20 mL Per Tube 6 times per day  . heparin  5,000 Units Subcutaneous 3 times per day  . ipratropium  0.5 mg Nebulization Q6H  . levalbuterol  0.63 mg Nebulization Q6H  . multivitamin  5 mL Per Tube Daily  . nicotine  14 mg Transdermal Daily  . pantoprazole (PROTONIX) IV  40 mg Intravenous Q24H  . sertraline  50 mg Oral QHS  . thiamine  100 mg Oral  Daily   Or  . thiamine  100 mg Intravenous Daily    Continuous Infusions: . sodium chloride 150 mL/hr at 01/17/14 0353  . dexmedetomidine 0.6 mcg/kg/hr (01/17/14 0645)  . feeding supplement (VITAL AF 1.2 CAL) 1,000 mL (01/16/14 1730)  . fentaNYL infusion INTRAVENOUS 150 mcg/hr (01/17/14 0824)  . norepinephrine (LEVOPHED) Adult infusion 2 mcg/min (01/16/14 1800)    Laurette Schimke MS, RD, LDN

## 2014-01-17 NOTE — Progress Notes (Signed)
Spoke with lab - confirmed that preliminary results for blood cultures are gram + cocci in pairs in all 4 bottles

## 2014-01-17 NOTE — Progress Notes (Signed)
CRITICAL VALUE ALERT  Critical value received:  Calcium 6.2  Date of notification:  01/17/14  Time of notification:  08:45  Critical value read back:Yes.     Nurse who received alert:  Juel Burrow, RN  MD notified (1st page):  Noe Gens, ACNP  Time of first page:  09:45  MD notified (2nd page):  Time of second page:  Responding MD:  Noe Gens ACNP  Time MD responded:  09:45

## 2014-01-17 NOTE — Procedures (Signed)
[facility]  Ultrasound of the right hemithorax performed at bedside by me for evaluation of possible pleural effusion.  There is a tiny pleural effusion < 1cm in depth at the lateral pleural space.  Certainly not large enough to drain.  The lung appears consolidated.  Roselie Awkward, MD [facility] Pager: [phone] Cell: [phone] If no response, call [phone]

## 2014-01-17 NOTE — Progress Notes (Signed)
Allen Wood: Allen Wood DOB: [birth year] MRN: [id number]   Date of Service  01/17/2014  HPI/Events of Note  CVP high KUB - ileus  eICU Interventions  Dc IVFs minimise fent gtt Hold TFs reglan 10 IV q 8h x 6 doses, can restart TFs after second dose at lower dose     Intervention Category Intermediate Interventions: Diagnostic test evaluation;Medication change / dose adjustment  Raniya Golembeski V. 01/17/2014, 11:58 PM

## 2014-01-17 NOTE — Consult Note (Signed)
PULMONARY / CRITICAL CARE MEDICINE   Name: Allen Wood MRN: [id number] DOB: 02/12/1970    ADMISSION DATE:  01/15/2014 CONSULTATION DATE:  01/16/14  REFERRING MD :  Dr. Grandville Silos   CHIEF COMPLAINT:  Acute Respiratory Failure   INITIAL PRESENTATION: 43 y/o M, smoker / ETOH abuse, admitted 12/21 with ongoing fatigue, cough with productive yellow sputum, R posterior back pain, chills & sweats.  Work up found patient to have RLL PNA with positive strep antigen.  Developed worsening respiratory failure 12/22  STUDIES:  12/21  CXR >> RLL consolidation, R pleural effusion   SIGNIFICANT EVENTS: 12/21  Admit with RLL PNA 12/22  Decompensated, required intubation for respiratory failure in setting of PNA 12/23  Stable on vent, increased pleural effusion on CXR.  Levo @ 2 mcg, Fent 162mcg, Precedex 0.6    SUBJECTIVE: RN reports no acute events, VSS on 72mcg levophed.  Low calcium.   VITAL SIGNS: Temp:  [99.9 F (37.7 C)-104.5 F (40.3 C)] 103.3 F (39.6 C) (12/23 0800) Pulse Rate:  [59-129] 96 (12/23 0800) Resp:  [12-23] 14 (12/23 0800) BP: (79-148)/(51-101) 99/66 mmHg (12/23 0800) SpO2:  [93 %-100 %] 95 % (12/23 0838) FiO2 (%):  [30 %-40 %] 30 % (12/23 0840) Weight:  [166 lb 0.1 oz (75.3 kg)] 166 lb 0.1 oz (75.3 kg) (12/23 0500)   HEMODYNAMICS: CVP:  [10 mmHg-16 mmHg] 14 mmHg   VENTILATOR SETTINGS: Vent Mode:  [-] PRVC FiO2 (%):  [30 %-40 %] 30 % Set Rate:  [14 bmp] 14 bmp Vt Set:  [600 mL] 600 mL PEEP:  [5 cmH20] 5 cmH20 Plateau Pressure:  [16 cmH20-19 cmH20] 19 cmH20   INTAKE / OUTPUT:  Intake/Output Summary (Last 24 hours) at 01/17/14 1005 Last data filed at 01/17/14 0600  Gross per 24 hour  Intake 4011.7 ml  Output   1225 ml  Net 2786.7 ml    PHYSICAL EXAMINATION: General:  Thin young adult male in NAD on vent Neuro:  Sedate on vent HEENT:  Dry MM, poor dentition  Cardiovascular:  s1s2 rrr,  no m/r/g  Lungs:  resp's even/non-labored, coarse rhonchi  bilaterally Abdomen:  NTND, bsx4 active  Musculoskeletal:  No acute distress  Skin: warm / dry, no edema   LABS:  CBC  Recent Labs Lab 01/15/14 2136 01/16/14 0830 01/17/14 0540  WBC 8.4 12.4* 10.0  HGB 10.7* 10.0* 9.9*  HCT 30.2* 28.7* 29.2*  PLT 177 173 PLATELET CLUMPS NOTED ON SMEAR, COUNT APPEARS ADEQUATE   BMET  Recent Labs Lab 01/15/14 1602 01/15/14 1958 01/16/14 0348 01/17/14 0540  NA 132*  --  137 136  K 3.6*  --  3.2* 3.3*  CL 85*  --  101 103  CO2 25  --  25 26  BUN 11  --  12 18  CREATININE 0.51 0.51 0.55 0.63  GLUCOSE 84  --  82 100*   Electrolytes  Recent Labs Lab 01/15/14 1602 01/16/14 0348 01/16/14 0830 01/17/14 0540  CALCIUM 8.8 6.6*  --  6.2*  MG  --   --  1.1* 1.8   Sepsis Markers  Recent Labs Lab 01/15/14 1614 01/15/14 1807 01/16/14 0830 01/17/14 0540  LATICACIDVEN 7.06* 5.17* 1.4  --   PROCALCITON  --   --  25.80 18.52   ABG  Recent Labs Lab 01/15/14 1930 01/16/14 0826 01/16/14 1020  PHART 7.429 7.489* 7.382  PCO2ART 35.3 33.5* 41.4  PO2ART 79.2* 58.8* 83.0   Liver Enzymes  Recent Labs Lab 01/15/14 1602  01/16/14 0348 01/17/14 0540  AST 50* 87* 44*  ALT 24 29 20   ALKPHOS 114 91 93  BILITOT 0.7 0.5 0.5  ALBUMIN 2.3* 2.0* 1.7*   Glucose  Recent Labs Lab 01/16/14 1231 01/16/14 1627 01/16/14 1957 01/16/14 2317 01/17/14 0330 01/17/14 0811  GLUCAP 89 101* 101* 117* 104* 109*    Imaging Dg Chest Port 1 View  01/16/2014   CLINICAL DATA:  Hypoxia  EXAM: PORTABLE CHEST - 1 VIEW  COMPARISON:  Study obtained earlier in the day  FINDINGS: The endotracheal tube tip is 5.0 cm above the carina. Central catheter tip is in the superior cava. Nasogastric tube tip and side port are below the diaphragm. No appreciable pneumothorax. There is consolidation in the right lower lobe with small right effusion. There is subtle infiltrate in the left base. Heart size and pulmonary vascularity are normal. No adenopathy.  IMPRESSION:  Tube and catheter positions as described without pneumothorax. Consolidation in both lower lobes, considerably more on the right than on the left. Small right effusion. Heart size within normal limits.   Electronically Signed   By: Lowella Grip M.D.   On: 01/16/2014 09:59   Dg Chest Port 1 View  01/16/2014   CLINICAL DATA:  Shortness of breath, smoker  EXAM: PORTABLE CHEST - 1 VIEW  COMPARISON:  Portable exam 0743 hr compared to 01/23/2014  FINDINGS: Normal heart size, mediastinal contours and pulmonary vascularity.  Increased atelectasis versus consolidation in BILATERAL lower lobes, RIGHT greater than LEFT.  Small RIGHT pleural effusion.  Upper lungs clear.  No pneumothorax or acute osseous findings.  IMPRESSION: Increased BILATERAL lower lobe atelectasis versus consolidation, greater on RIGHT.  Small RIGHT pleural effusion.   Electronically Signed   By: Lavonia Dana M.D.   On: 01/16/2014 08:04     ASSESSMENT / PLAN:  PULMONARY OETT 12/22 >>  A: Acute Hypoxic Respiratory Failure - in setting of RLL PNA.  Minimal O2 requirements. RLL CAP - with positive strep antigen R Pleural Effusion  Tobacco Abuse P:   IMV, 8cc/kg Trend CXR Xopenex/Atrovent Q6 + Q3 PRN xopenex  Assess R pleural space with [place] 12/23 Monitor PaO2/FiO2 ratio See ID   CARDIOVASCULAR CVL L IJ TLC 12/22 >>  A:  Severe Sepsis - in setting of PNA  Tachycardia  P:  Assess CPV Q4 Levophed to support MAP > 65 if needed  PRN Labetalol for SBP > 170  RENAL A:   Hypokalemia  Hypomagnesemia  P:   Trend BMP Replace electrolytes as indicated K 40 mEq PT BID x 2 doses 12/23 Corrected calcium for albumin = 8  GASTROINTESTINAL A:   Protein Calorie Malnutrition - albumin 2.0 Vent Associated Dysphagia  P:   OGT TF per nutrition rec's  PPI  HEMATOLOGIC A:   Macrocytic Anemia - in setting of ETOH abuse P:  Thiamine / Folate / MVI Trend CBC Heparin for DVT prophylaxis   INFECTIOUS A:   RLL PNA - strep  antigen positive  R/O infected pleural space  Fever - tmax 104.5 12/22 P:   BCx2 12/21 >> GPC's in pairs >> UC  12/21 >>  Sputum 12/21 >>   Abx: Rocephin, start date 12/21, day 2/x Abx: Azithro, start date 12/21, day 2/x  PCT protocol  PRN tylenol    ENDOCRINE A:   No acute issues  P:   Monitor glucose on BMP  NEUROLOGIC A:   ETOH Abuse - at risk withdrawal Anxiety / Depression  P:  RASS goal: -1 Consider early addition of oral agents to reduce IV sedation need, early assessment appears he will be difficult to sedate + hx of ETOH Fentanyl gtt for pain  PRN versed for sedation  Continue wellbutrin, zoloft    FAMILY  - Updates: No family available at time of rounds.     Noe Gens, NP-C [facility] Pgr: [phone] or [phone]   01/17/2014, 10:05 AM   Attending:  I have seen and examined the patient with nurse practitioner/resident and agree with the note above.   On my exam> rhonchi on the left, he is sedated on vent, intermittent jerking extremities  Chart reviewed> high fever, supposedly GPC's in blood but no documentation in micro results, we will ask lab to clarify  I think the fever is due to sepsis, but will stop the serotonin inhibitors just in case there is a central component to this This whole picture is consistent with severe strep pneumonia.  Impression: 1 ) septic shock 2) severe CAP 3) acute respiratory failure 4) EtOH withdrawal  Continue current support, no change in antibiotics for now Continue to treat EtOH withdrawal with precedex Currently not ready for extubation based on mental status  Sister updated by me 12/23  My cc time 40 minutes  Roselie Awkward, MD [facility] Pager: [phone] Cell: [phone] If no response, call [phone]

## 2014-01-18 ENCOUNTER — Inpatient Hospital Stay (HOSPITAL_COMMUNITY): Payer: Medicaid Other

## 2014-01-18 ENCOUNTER — Inpatient Hospital Stay (HOSPITAL_COMMUNITY)
Admission: EM | Admit: 2014-01-18 | Discharge: 2014-01-18 | Disposition: A | Discharge status: Home / Self Care | Payer: Medicaid Other | Source: Home / Self Care | Attending: Pulmonary Disease | Admitting: Pulmonary Disease

## 2014-01-18 DIAGNOSIS — R569 Unspecified convulsions: Secondary | ICD-10-CM

## 2014-01-18 DIAGNOSIS — R7881 Bacteremia: Secondary | ICD-10-CM

## 2014-01-18 LAB — BASIC METABOLIC PANEL
ANION GAP: 3 — AB (ref 5–15)
ANION GAP: 5 (ref 5–15)
BUN: 19 mg/dL (ref 6–23)
BUN: 17 mg/dL (ref 6–23)
CO2: 25 mmol/L (ref 19–32)
CO2: 26 mmol/L (ref 19–32)
Calcium: 6.3 mg/dL — CL (ref 8.4–10.5)
Calcium: 6.5 mg/dL — ABNORMAL LOW (ref 8.4–10.5)
Chloride: 112 mEq/L (ref 96–112)
Chloride: 109 mEq/L (ref 96–112)
Creatinine, Ser: 0.68 mg/dL (ref 0.50–1.35)
Creatinine, Ser: 0.64 mg/dL (ref 0.50–1.35)
Glucose, Bld: 115 mg/dL — ABNORMAL HIGH (ref 70–99)
Glucose, Bld: 100 mg/dL — ABNORMAL HIGH (ref 70–99)
Potassium: 4.4 mmol/L (ref 3.5–5.1)
Potassium: 4.2 mmol/L (ref 3.5–5.1)
SODIUM: 140 mmol/L (ref 135–145)
Sodium: 140 mmol/L (ref 135–145)

## 2014-01-18 LAB — URINE CULTURE
Colony Count: NO GROWTH
Culture: NO GROWTH

## 2014-01-18 LAB — CBC
HCT: 27.6 % — ABNORMAL LOW (ref 39.0–52.0)
Hemoglobin: 9 g/dL — ABNORMAL LOW (ref 13.0–17.0)
MCH: 35.7 pg — AB (ref 26.0–34.0)
MCHC: 32.6 g/dL (ref 30.0–36.0)
MCV: 109.5 fL — ABNORMAL HIGH (ref 78.0–100.0)
PLATELETS: 129 10*3/uL — AB (ref 150–400)
RBC: 2.52 MIL/uL — AB (ref 4.22–5.81)
RDW: 12.8 % (ref 11.5–15.5)
WBC: 15 10*3/uL — ABNORMAL HIGH (ref 4.0–10.5)

## 2014-01-18 LAB — GLUCOSE, CAPILLARY
GLUCOSE-CAPILLARY: 89 mg/dL (ref 70–99)
GLUCOSE-CAPILLARY: 90 mg/dL (ref 70–99)
Glucose-Capillary: 108 mg/dL — ABNORMAL HIGH (ref 70–99)
Glucose-Capillary: 77 mg/dL (ref 70–99)
Glucose-Capillary: 91 mg/dL (ref 70–99)

## 2014-01-18 LAB — PROCALCITONIN: Procalcitonin: 11.55 ng/mL

## 2014-01-18 LAB — MAGNESIUM: Magnesium: 1.9 mg/dL (ref 1.5–2.5)

## 2014-01-18 MED ORDER — ACETAMINOPHEN 325 MG PO TABS
650.0000 mg | ORAL_TABLET | Freq: Four times a day (QID) | ORAL | Status: AC
  Administered 2014-01-18 – 2014-01-19 (×4): 650 mg via ORAL
  Filled 2014-01-18 (×4): qty 2

## 2014-01-18 MED ORDER — FUROSEMIDE 10 MG/ML IJ SOLN
40.0000 mg | Freq: Two times a day (BID) | INTRAMUSCULAR | Status: AC
  Administered 2014-01-18 (×2): 40 mg via INTRAVENOUS
  Filled 2014-01-18 (×3): qty 4

## 2014-01-18 MED ORDER — SODIUM CHLORIDE 0.9 % IV SOLN
1.0000 g | Freq: Once | INTRAVENOUS | Status: AC
  Administered 2014-01-18: 1 g via INTRAVENOUS
  Filled 2014-01-18: qty 10

## 2014-01-18 MED ORDER — LEVETIRACETAM IN NACL 1000 MG/100ML IV SOLN
1000.0000 mg | Freq: Once | INTRAVENOUS | Status: AC
  Administered 2014-01-18: 1000 mg via INTRAVENOUS
  Filled 2014-01-18: qty 100

## 2014-01-18 MED ORDER — SODIUM CHLORIDE 0.9 % IV SOLN
0.0000 mg/h | INTRAVENOUS | Status: DC
  Administered 2014-01-18: 2 mg/h via INTRAVENOUS
  Administered 2014-01-18: 3 mg/h via INTRAVENOUS
  Administered 2014-01-19: 10 mg/h via INTRAVENOUS
  Administered 2014-01-19: 2 mg/h via INTRAVENOUS
  Administered 2014-01-19: 8 mg/h via INTRAVENOUS
  Administered 2014-01-19: 6 mg/h via INTRAVENOUS
  Administered 2014-01-20: 2 mg/h via INTRAVENOUS
  Administered 2014-01-20: 5 mg/h via INTRAVENOUS
  Filled 2014-01-18 (×7): qty 10

## 2014-01-18 MED ORDER — POTASSIUM CHLORIDE 20 MEQ/15ML (10%) PO SOLN
40.0000 meq | Freq: Once | ORAL | Status: AC
  Administered 2014-01-18: 40 meq
  Filled 2014-01-18: qty 30

## 2014-01-18 MED ORDER — BISACODYL 10 MG RE SUPP
10.0000 mg | Freq: Every day | RECTAL | Status: DC | PRN
  Administered 2014-01-18: 10 mg via RECTAL
  Filled 2014-01-18 (×2): qty 1

## 2014-01-18 NOTE — Progress Notes (Signed)
EEG completed, results pending. 

## 2014-01-18 NOTE — Progress Notes (Signed)
[facility] Progress Note Patient Name: Allen Wood DOB: [birth year] MRN: [id number]   Date of Service  01/18/2014  HPI/Events of Note  Second seizure < 23min  eICU Interventions  Neuro consult Dc precedex, start versed gtt     Intervention Category Major Interventions: Seizures - evaluation and management  [name] V. 01/18/2014, 4:09 AM

## 2014-01-18 NOTE — Progress Notes (Signed)
eLink Physician-Brief Progress Note Patient Name: Allen Wood DOB: May 24, 1970 MRN: [id number]   Date of Service  01/18/2014  HPI/Events of Note  Witnessed ? Seizure like activity  eICU Interventions  Head CT EEG     Intervention Category Major Interventions: Seizures - evaluation and management  [name] V. 01/18/2014, 2:56 AM

## 2014-01-18 NOTE — Progress Notes (Signed)
PULMONARY / CRITICAL CARE MEDICINE   Name: Allen Wood MRN: [id number] DOB: [birth year]    ADMISSION DATE:  01/15/2014 CONSULTATION DATE:  01/16/14  REFERRING MD :  Dr. Grandville Silos   CHIEF COMPLAINT:  Acute Respiratory Failure   INITIAL PRESENTATION: 43 y/o M, smoker / ETOH abuse, admitted 12/21 with ongoing fatigue, cough with productive yellow sputum, R posterior back pain, chills & sweats.  Work up found patient to have RLL PNA with positive strep antigen.  Developed worsening respiratory failure 12/22  STUDIES:  12/21  CXR >> RLL consolidation, R pleural effusion   SIGNIFICANT EVENTS: 12/21  Admit with RLL PNA 12/22  Decompensated, required intubation for respiratory failure in setting of PNA 12/23  Stable on vent, increased pleural effusion on CXR.  Levo @ 2 mcg, Fent 15mcg, Precedex 0.6.  Seizures overnight, Tmax 105  SUBJECTIVE: RN reports seizures x2 overnight.  Sedation changed to versed / fentanyl. Precedex d/c'd.  Tmax 105.  EEG pending.  Concern for ileus with TF's coming out of mouth overnight.  Reglan dosing  VITAL SIGNS: Temp:  [100.6 F (38.1 C)-105.1 F (40.6 C)] 101.8 F (38.8 C) (12/24 0800) Pulse Rate:  [85-104] 88 (12/24 0800) Resp:  [13-16] 14 (12/24 0800) BP: (87-111)/(61-78) 94/61 mmHg (12/24 0800) SpO2:  [92 %-100 %] 97 % (12/24 0800) FiO2 (%):  [30 %-40 %] 40 % (12/24 0237) Weight:  [172 lb 6.4 oz (78.2 kg)] 172 lb 6.4 oz (78.2 kg) (12/24 0500)   HEMODYNAMICS: CVP:  [12 mmHg-20 mmHg] 15 mmHg   VENTILATOR SETTINGS: Vent Mode:  [-] PRVC FiO2 (%):  [30 %-40 %] 40 % Set Rate:  [14 bmp] 14 bmp Vt Set:  [600 mL] 600 mL PEEP:  [5 cmH20] 5 cmH20 Plateau Pressure:  [19 cmH20-22 cmH20] 21 cmH20   INTAKE / OUTPUT:  Intake/Output Summary (Last 24 hours) at 01/18/14 0853 Last data filed at 01/18/14 0800  Gross per 24 hour  Intake 4531.98 ml  Output   1585 ml  Net 2946.98 ml    PHYSICAL EXAMINATION: General:  Thin young adult male in NAD on  vent Neuro:  Sedate on vent, arouses to name, follows commands HEENT:  Dry MM, poor dentition  Cardiovascular:  s1s2 rrr,  no m/r/g  Lungs:  resp's even/non-labored, coarse rhonchi bilaterally R>L Abdomen:  NT, mild distention, bsx4 active  Musculoskeletal:  No acute distress  Skin: warm / dry, no edema   LABS:  CBC  Recent Labs Lab 01/16/14 0830 01/17/14 0540 01/18/14 0300  WBC 12.4* 10.0 15.0*  HGB 10.0* 9.9* 9.0*  HCT 28.7* 29.2* 27.6*  PLT 173 PLATELET CLUMPS NOTED ON SMEAR, COUNT APPEARS ADEQUATE 129*   BMET  Recent Labs Lab 01/16/14 0348 01/17/14 0540 01/18/14 0300  NA 137 136 140  K 3.2* 3.3* 4.4  CL 101 103 112  CO2 25 26 25   BUN 12 18 19   CREATININE 0.55 0.63 0.68  GLUCOSE 82 100* 115*   Electrolytes  Recent Labs Lab 01/16/14 0348 01/16/14 0830 01/17/14 0540 01/18/14 0300  CALCIUM 6.6*  --  6.2* 6.3*  MG  --  1.1* 1.8 1.9   Sepsis Markers  Recent Labs Lab 01/15/14 1614 01/15/14 1807 01/16/14 0830 01/17/14 0540 01/18/14 0300  LATICACIDVEN 7.06* 5.17* 1.4  --   --   PROCALCITON  --   --  25.80 18.52 11.55   ABG  Recent Labs Lab 01/15/14 1930 01/16/14 0826 01/16/14 1020  PHART 7.429 7.489* 7.382  PCO2ART 35.3 33.5* 41.4  PO2ART 79.2* 58.8* 83.0   Liver Enzymes  Recent Labs Lab 01/15/14 1602 01/16/14 0348 01/17/14 0540  AST 50* 87* 44*  ALT 24 29 20   ALKPHOS 114 91 93  BILITOT 0.7 0.5 0.5  ALBUMIN 2.3* 2.0* 1.7*   Glucose  Recent Labs Lab 01/17/14 1223 01/17/14 1601 01/17/14 2014 01/17/14 2346 01/18/14 0414 01/18/14 0753  GLUCAP 120* 113* 111* 96 108* 89    Imaging Dg Chest Port 1 View  01/17/2014   CLINICAL DATA:  Respiratory distress, intubated patient  EXAM: PORTABLE CHEST - 1 VIEW  COMPARISON:  Portable chest x-ray of January 16, 2014  FINDINGS: There remains a moderate size right pleural effusion. The pulmonary interstitial markings remain increased especially on the right. The cardiac silhouette is normal  in size. The central pulmonary vascularity is prominent. No alveolar infiltrate is demonstrated.  The endotracheal tube tip lies 5.2 cm above the crotch of the carina. The esophagogastric tube tip projects below the inferior margin of the image. The left internal jugular venous catheter tip projects over the distal third of the SVC.  IMPRESSION: Increasing volume of the right pleural effusion. Stable bibasilar atelectasis. Persistent pulmonary interstitial edema likely secondary to CHF. The support tubes and lines are in reasonable position.   Electronically Signed   By: David  Martinique   On: 01/17/2014 07:42   Dg Abd Portable 1v  01/18/2014   CLINICAL DATA:  Abdominal distention  EXAM: PORTABLE ABDOMEN - 1 VIEW  COMPARISON:  None.  FINDINGS: Nasogastric tube tip terminates over the distal stomach. Nondilated loops of redundant colon are noted over the mid abdomen. Presence or absence of air-fluid levels or free air is suboptimally evaluated on this supine projection. No gas-filled dilated loop of small bowel is identified. No acute osseous abnormality.  IMPRESSION: No evidence for bowel obstruction.   Electronically Signed   By: Conchita Paris M.D.   On: 01/18/2014 00:59     ASSESSMENT / PLAN:  PULMONARY OETT 12/22 >>  A: Acute Hypoxic Respiratory Failure - in setting of RLL PNA.  Minimal O2 requirements. RLL CAP - with positive strep antigen R Pleural Effusion - R pleural space assessed with [place] 12/23, not enough fluid for thora.   Tobacco Abuse P:   IMV, 8cc/kg Trend CXR Xopenex/Atrovent Q6 + Q3 PRN xopenex  Monitor R pleural space.  Monitor PaO2/FiO2 ratio See ID   CARDIOVASCULAR CVL L IJ TLC 12/22 >>  A:  Severe Sepsis - in setting of PNA.  Required pressors 12/23, off 12/24 Tachycardia  P:  Assess CPV Q4 Levophed to support MAP > 65 if needed  PRN Labetalol for SBP > 170 Lasix 40 BID x2 doses with KCL 12/24   RENAL A:   Hypokalemia  Hypomagnesemia  P:   Trend BMP Replace  electrolytes as indicated Corrected calcium for albumin = 8, s/p calcium gluconate 12/23 in setting of seizure  GASTROINTESTINAL A:   Protein Calorie Malnutrition - albumin 2.0 Vent Associated Dysphagia  R/O Ileus - no evidence for bowel obstruction 12/23 P:   OGT TF per nutrition rec's  PPI Reglan x 6 doses then d/c Ok to resume TF 12/24   HEMATOLOGIC A:   Macrocytic Anemia - in setting of ETOH abuse P:  Thiamine / Folate / MVI Trend CBC Heparin for DVT prophylaxis   INFECTIOUS A:   RLL PNA - strep antigen positive  R/O infected pleural space  Fever - tmax 105 12/23 P:   BCx2 12/21 >> UC  12/21 >>  Sputum 12/21 >> moderate strep pneumoniae >>  Abx: Rocephin, start date 12/21, day 3/x Abx: Azithro, start date 12/21, day 3/x  PCT protocol 24 hours scheduled tylenol given high temps / seizures, then Q6 PRN    ENDOCRINE A:   No acute issues  P:   Monitor glucose on BMP  NEUROLOGIC A:   ETOH Abuse - at risk withdrawal Seizures - x2 overnight 12/23, fever up to 105 +/- ETOH withdrawal Anxiety / Depression  P:   RASS goal: -1 Consider early addition of oral agents to reduce IV sedation need, early assessment appears he will be difficult to sedate + hx of ETOH Fentanyl gtt for pain  Versed gtt in setting of seizure Tylenol as above  EEG Neurology consult, appreciate input D/c wellbutrin, zoloft  Keppra per Neuro   FAMILY  - Updates: No family available at time of rounds.     Noe Gens, NP-C [facility] Pgr: [phone] or [phone]   01/18/2014, 8:53 AM    Attending:  I have seen and examined the patient with nurse practitioner/resident and agree with the note above.   On my exam he is tremulous this morning, diminished sounds R base Events of night noted, suspect seizure was due to fever and EtOH withdrawal, +/- hypocalemia (corrects to normal when adjusted to albumin)  Procalcitonin declining, overall picture fits  with natural history of severe strep pneumonia in an alcoholic  Ileus noted Pulmonary edema  Plan: Monitor calcium F/u EEG Continue keppra Narrow antibiotics, drop azithromycin as ceftriaxone preferred agent for strep Add dulcolax  Wife updated by phone this morning by me  My cc time 40 minutes  Roselie Awkward, MD [facility] Pager: [phone] Cell: [phone] If no response, call [phone]

## 2014-01-18 NOTE — Progress Notes (Signed)
Notify Elink concerning Pt's taut abdomen and at 2100. Ordered to hold tube feed. MD ordered KUB. After KUB results. MD stated appearance of Ileus. Ordered to decrease Fentanyl and hold tube feed, okay to give meds by tube.

## 2014-01-18 NOTE — Procedures (Signed)
ELECTROENCEPHALOGRAM REPORT  Patient: Allen Wood       Room #: 4401 EEG No. ID: [id number] Age: 43 y.o.        Sex: male Referring Physician: Lake Bells, D Report Date:  01/18/2014        Interpreting Physician: Anthony Sar  History: Allen Wood is an 43 y.o. male with a history of alcohol abuse who was admitted following generalized seizures thought to likely be secondary to alcohol withdrawal.  Indications for study:  Rule out ongoing seizure activity.  Technique: This is an 18 channel routine scalp EEG performed at the bedside with bipolar and monopolar montages arranged in accordance to the international 10/20 system of electrode placement.   Description: Patient was noted to be intubated and sedated with Versed and fentanyl at the time of this recording. Predominant activity consisted of low to moderate amplitude diffuse symmetrical delta and theta activity with occasional symmetrical sleep spindles and vertex waves. Photic stimulation was not performed. No epileptiform discharges were recorded.  Interpretation: This EEG shows a pattern of normal sleep, consistent with patient's state of sedation. No epileptiform activity was recorded.   Rush Farmer M.D. [facility] [phone]

## 2014-01-18 NOTE — Consult Note (Addendum)
Consult Reason for Consult:recurrent seizures Referring Physician: Dr Elsworth Soho  CC: seizures  HPI: Allen Wood is an 43 y.o. male with history of EtOH abuse (drinks 1 to 2 pints a day) currently admitted for recurrent PNA noted to have 2 episodes of seizure activity tonight. Was admitted on for CAP. Told last drink was on evening of 12/20. Was started on  CIWA protocol. Had increased respiratory effort and intubated on 12/22. Was started on precedex gtt and fentanyl. On morning of 12/24 noted to have around 1 minute of GTC seizure activity which broke on its own. Around 1hr later had 2nd shorter episode, given dose of versed and precedex gtt discontinued and versed gtt started. Stat head CT completed (imaging reviewed) and overall unremarkable. Lab work pertinent for calcium of 6.3, WBC 15, remains febrile though trending down.   Past Medical History  Diagnosis Date  . Alcohol abuse   . Depression   . Anxiety   . Pneumonia     Past Surgical History  Procedure Laterality Date  . Mouth surgery      teeth removed.    Family History  Problem Relation Age of Onset  . Cancer Mother     lung  . Cancer Father     kidney    Social History:  reports that he has been smoking Cigarettes.  He has a 10 pack-year smoking history. He has never used smokeless tobacco. He reports that he drinks alcohol. He reports that he does not use illicit drugs.  No Known Allergies  Medications:  Scheduled: . antiseptic oral rinse  7 mL Mouth Rinse QID  . azithromycin (ZITHROMAX) 500 MG IVPB  500 mg Intravenous Q24H  . calcium gluconate  1 g Intravenous Once  . cefTRIAXone (ROCEPHIN)  IV  2 g Intravenous Q24H  . chlorhexidine  15 mL Mouth Rinse BID  . folic acid  1 mg Oral Daily  . guaiFENesin  20 mL Per Tube 6 times per day  . heparin  5,000 Units Subcutaneous 3 times per day  . ipratropium  0.5 mg Nebulization Q6H  . levalbuterol  0.63 mg Nebulization Q6H  . levETIRAcetam  1,000 mg Intravenous Once   . metoCLOPramide (REGLAN) injection  10 mg Intravenous 3 times per day  . multivitamin  5 mL Per Tube Daily  . nicotine  14 mg Transdermal Daily  . pantoprazole (PROTONIX) IV  40 mg Intravenous Q24H  . thiamine  100 mg Oral Daily   Or  . thiamine  100 mg Intravenous Daily    ROS: Out of a complete 14 system review, the patient complains of only the following symptoms, and all other reviewed systems are negative. Unable to obtain due to mental status  Physical Examination: Filed Vitals:   01/18/14 0000  BP: 102/76  Pulse: 87  Temp: 100.6 F (38.1 C)  Resp: 14   Physical Exam  Constitutional: He appears well-developed and well-nourished.  Psych: intubated and sedated Eyes: No scleral injection HENT: No OP obstrucion Head: Normocephalic.  Cardiovascular: Normal rate and regular rhythm.  Respiratory: course, bilateral breath sounds  GI: Soft. Bowel sounds are normal. No distension. There is no tenderness.  Skin: WDI  Neurologic Examination Mental Status: Sedated so limited exam. No spontaneous eye opening, not following commands.  Cranial Nerves: II: unable to visualize fundi due to pupil size,pupils 58mm equal, round, reactive to light  III,IV, VI: ptosis not present,eyes midline, no gaze deviation V,VIIface grossly symmetric VIII: hearing normal bilaterally IX,X: gag reflex  present Motor: Unable to test due to mental status. No spontaneous movement noted. Minimal withdrawal to noxious stimuli in bilateral UE. No withdrawal in bilateral LE Tone and bulk:normal tone throughout; no atrophy noted Sensory: minimal withdrawal to noxious stimuli in bilateral UE, no withdrawal in LE Deep Tendon Reflexes: 1+ and symmetric throughout Plantars: Right: downgoing   Left: downgoing Cerebellar: Unable to test due to mental status Gait: unable to test due to mental status  Laboratory Studies:   Basic Metabolic Panel:  Recent Labs Lab 01/15/14 1602 01/15/14 1958  01/16/14 0348 01/16/14 0830 01/17/14 0540 01/18/14 0300  NA 132*  --  137  --  136 140  K 3.6*  --  3.2*  --  3.3* 4.4  CL 85*  --  101  --  103 112  CO2 25  --  25  --  26 25  GLUCOSE 84  --  82  --  100* 115*  BUN 11  --  12  --  18 19  CREATININE 0.51 0.51 0.55  --  0.63 0.68  CALCIUM 8.8  --  6.6*  --  6.2* 6.3*  MG  --   --   --  1.1* 1.8  --     Liver Function Tests:  Recent Labs Lab 01/15/14 1602 01/16/14 0348 01/17/14 0540  AST 50* 87* 44*  ALT 24 29 20   ALKPHOS 114 91 93  BILITOT 0.7 0.5 0.5  PROT 6.5 4.9* 4.8*  ALBUMIN 2.3* 2.0* 1.7*   No results for input(s): LIPASE, AMYLASE in the last 168 hours. No results for input(s): AMMONIA in the last 168 hours.  CBC:  Recent Labs Lab 01/15/14 1602 01/15/14 2136 01/16/14 0830 01/17/14 0540 01/18/14 0300  WBC 10.5 8.4 12.4* 10.0 15.0*  NEUTROABS  --  7.9* 11.8* 9.1*  --   HGB 12.1* 10.7* 10.0* 9.9* 9.0*  HCT 34.6* 30.2* 28.7* 29.2* 27.6*  MCV 101.5* 101.3* 102.5* 106.6* 109.5*  PLT 220 177 173 PLATELET CLUMPS NOTED ON SMEAR, COUNT APPEARS ADEQUATE 129*    Cardiac Enzymes: No results for input(s): CKTOTAL, CKMB, CKMBINDEX, TROPONINI in the last 168 hours.  BNP: Invalid input(s): POCBNP  CBG:  Recent Labs Lab 01/17/14 1223 01/17/14 1601 01/17/14 2014 01/17/14 2346 01/18/14 0414  GLUCAP 120* 113* 111* 81 108*    Microbiology: Results for orders placed or performed during the hospital encounter of 01/15/14  MRSA PCR Screening     Status: None   Collection Time: 01/15/14  8:24 PM  Result Value Ref Range Status   MRSA by PCR NEGATIVE NEGATIVE Final    Comment:        The GeneXpert MRSA Assay (FDA approved for NASAL specimens only), is one component of a comprehensive MRSA colonization surveillance program. It is not intended to diagnose MRSA infection nor to guide or monitor treatment for MRSA infections.   Culture, sputum-assessment     Status: None   Collection Time: 01/15/14  8:40 PM   Result Value Ref Range Status   Specimen Description SPUTUM  Final   Special Requests NONE  Final   Sputum evaluation   Final    THIS SPECIMEN IS ACCEPTABLE. RESPIRATORY CULTURE REPORT TO FOLLOW.   Report Status 01/15/2014 FINAL  Final  Culture, respiratory (NON-Expectorated)     Status: None (Preliminary result)   Collection Time: 01/15/14  8:40 PM  Result Value Ref Range Status   Specimen Description SPUTUM  Final   Special Requests NONE  Final  Gram Stain   Final    FEW WBC PRESENT,BOTH PMN AND MONONUCLEAR RARE SQUAMOUS EPITHELIAL CELLS PRESENT FEW GRAM POSITIVE COCCI IN PAIRS Performed at [facility]    Culture   Final    Culture reincubated for better growth Performed at [facility]    Report Status PENDING  Incomplete  Culture, respiratory (NON-Expectorated)     Status: None (Preliminary result)   Collection Time: 01/16/14 10:11 AM  Result Value Ref Range Status   Specimen Description TRACHEAL ASPIRATE  Final   Special Requests Normal  Final   Gram Stain   Final    MODERATE WBC PRESENT, PREDOMINANTLY PMN NO SQUAMOUS EPITHELIAL CELLS SEEN FEW GRAM NEGATIVE COCCOBACILLI Performed at [facility]    Culture NO GROWTH Performed at [facility]   Final   Report Status PENDING  Incomplete    Coagulation Studies: No results for input(s): LABPROT, INR in the last 72 hours.  Urinalysis:  Recent Labs Lab 01/16/14 2248  COLORURINE YELLOW  LABSPEC 1.023  PHURINE 6.0  GLUCOSEU NEGATIVE  HGBUR MODERATE*  BILIRUBINUR NEGATIVE  KETONESUR NEGATIVE  PROTEINUR 30*  UROBILINOGEN 1.0  NITRITE NEGATIVE  LEUKOCYTESUR NEGATIVE    Lipid Panel:  No results found for: CHOL, TRIG, HDL, CHOLHDL, VLDL, LDLCALC  HgbA1C: No results found for: HGBA1C  Urine Drug Screen:     Component Value Date/Time   LABOPIA NONE DETECTED 10/31/2012 1930   COCAINSCRNUR NONE DETECTED 10/31/2012 1930   LABBENZ NONE DETECTED 10/31/2012 1930   AMPHETMU NONE  DETECTED 10/31/2012 1930   THCU NONE DETECTED 10/31/2012 1930   LABBARB NONE DETECTED 10/31/2012 1930    Alcohol Level:  Recent Labs Lab 01/15/14 1602  ETH <11    Other results:  Imaging: Dg Chest Port 1 View  01/17/2014   CLINICAL DATA:  Respiratory distress, intubated patient  EXAM: PORTABLE CHEST - 1 VIEW  COMPARISON:  Portable chest x-ray of January 16, 2014  FINDINGS: There remains a moderate size right pleural effusion. The pulmonary interstitial markings remain increased especially on the right. The cardiac silhouette is normal in size. The central pulmonary vascularity is prominent. No alveolar infiltrate is demonstrated.  The endotracheal tube tip lies 5.2 cm above the crotch of the carina. The esophagogastric tube tip projects below the inferior margin of the image. The left internal jugular venous catheter tip projects over the distal third of the SVC.  IMPRESSION: Increasing volume of the right pleural effusion. Stable bibasilar atelectasis. Persistent pulmonary interstitial edema likely secondary to CHF. The support tubes and lines are in reasonable position.   Electronically Signed   By: David  Martinique   On: 01/17/2014 07:42   Dg Chest Port 1 View  01/16/2014   CLINICAL DATA:  Hypoxia  EXAM: PORTABLE CHEST - 1 VIEW  COMPARISON:  Study obtained earlier in the day  FINDINGS: The endotracheal tube tip is 5.0 cm above the carina. Central catheter tip is in the superior cava. Nasogastric tube tip and side port are below the diaphragm. No appreciable pneumothorax. There is consolidation in the right lower lobe with small right effusion. There is subtle infiltrate in the left base. Heart size and pulmonary vascularity are normal. No adenopathy.  IMPRESSION: Tube and catheter positions as described without pneumothorax. Consolidation in both lower lobes, considerably more on the right than on the left. Small right effusion. Heart size within normal limits.   Electronically Signed   By:  Lowella Grip M.D.   On: 01/16/2014  09:59   Dg Chest Port 1 View  01/16/2014   CLINICAL DATA:  Shortness of breath, smoker  EXAM: PORTABLE CHEST - 1 VIEW  COMPARISON:  Portable exam 7939 hr compared to 01/23/2014  FINDINGS: Normal heart size, mediastinal contours and pulmonary vascularity.  Increased atelectasis versus consolidation in BILATERAL lower lobes, RIGHT greater than LEFT.  Small RIGHT pleural effusion.  Upper lungs clear.  No pneumothorax or acute osseous findings.  IMPRESSION: Increased BILATERAL lower lobe atelectasis versus consolidation, greater on RIGHT.  Small RIGHT pleural effusion.   Electronically Signed   By: Lavonia Dana M.D.   On: 01/16/2014 08:04   Dg Abd Portable 1v  01/18/2014   CLINICAL DATA:  Abdominal distention  EXAM: PORTABLE ABDOMEN - 1 VIEW  COMPARISON:  None.  FINDINGS: Nasogastric tube tip terminates over the distal stomach. Nondilated loops of redundant colon are noted over the mid abdomen. Presence or absence of air-fluid levels or free air is suboptimally evaluated on this supine projection. No gas-filled dilated loop of small bowel is identified. No acute osseous abnormality.  IMPRESSION: No evidence for bowel obstruction.   Electronically Signed   By: Conchita Paris M.D.   On: 01/18/2014 00:59     Assessment/Plan:  43y/o gentleman with history of EtOH abuse admitted on 12/21 for CAP. Hospital course complicated by respiratory decline and intubation on 12/22. History of heavy EtOH intake, last drink on evening of 12/20. Started on CIWA protocol upon admission. Had 2 GTC seizures on morning of 12/24. Timing of this seizure is late for a typical EtOH withdrawal seizure. Patient is febrile with white count trending up. Labs also pertinent for calcium of 6.3.   -will load with keppra 1000mg  x 1 dose. Will consider standing dose pending EEG -check EEG  -check magnesium -calcium gluconate ordered by PCCM  -will continue to follow   Jim Like,  DO [facility] [phone]  If 7pm- 7am, please page neurology on call as listed in [place]. 01/18/2014, 4:38 AM

## 2014-01-18 NOTE — Progress Notes (Signed)
CRITICAL VALUE ALERT  Critical value received:  Calcium 6.3   Date of notification:  01/18/2014   Time of notification:  0345  Critical value read back:Yes.    Nurse who received alert:  Irene Pap, RN   MD notified (1st page):  Dr Elsworth Soho, via [place]  Time of first page:  0405  MD notified (2nd page):  Time of second page:  Responding MD:  Dr. Elsworth Soho  Time MD responded:  [phone]

## 2014-01-18 NOTE — Progress Notes (Signed)
0300 (01/18/14) Pt desat to 60s, seizure for approximately 30 seconds after my entrance to room.  Pushed Elink button. MD ordered CT scan and EEG.  Took pt to CT. Arrived back to ICU at 0400 and pt had another seizure. Notified Elink. Med orders changed. Notified Wife, updated.

## 2014-01-19 ENCOUNTER — Inpatient Hospital Stay (HOSPITAL_COMMUNITY): Payer: Medicaid Other

## 2014-01-19 DIAGNOSIS — A403 Sepsis due to Streptococcus pneumoniae: Secondary | ICD-10-CM

## 2014-01-19 LAB — BASIC METABOLIC PANEL
Anion gap: 8 (ref 5–15)
BUN: 13 mg/dL (ref 6–23)
CALCIUM: 6.6 mg/dL — AB (ref 8.4–10.5)
CO2: 29 mmol/L (ref 19–32)
CREATININE: 0.57 mg/dL (ref 0.50–1.35)
Chloride: 103 mEq/L (ref 96–112)
GFR calc Af Amer: >90 mL/min (ref >90)
GFR calc non Af Amer: >90 mL/min (ref >90)
GLUCOSE: 102 mg/dL — AB (ref 70–99)
Potassium: 3.4 mmol/L — ABNORMAL LOW (ref 3.5–5.1)
Sodium: 140 mmol/L (ref 135–145)

## 2014-01-19 LAB — GLUCOSE, CAPILLARY
GLUCOSE-CAPILLARY: 106 mg/dL — AB (ref 70–99)
GLUCOSE-CAPILLARY: 96 mg/dL (ref 70–99)
GLUCOSE-CAPILLARY: 97 mg/dL (ref 70–99)
Glucose-Capillary: 103 mg/dL — ABNORMAL HIGH (ref 70–99)
Glucose-Capillary: 94 mg/dL (ref 70–99)
Glucose-Capillary: 88 mg/dL (ref 70–99)

## 2014-01-19 LAB — CULTURE, RESPIRATORY

## 2014-01-19 LAB — CBC
HEMATOCRIT: 25.9 % — AB (ref 39.0–52.0)
Hemoglobin: 8.5 g/dL — ABNORMAL LOW (ref 13.0–17.0)
MCH: 36 pg — ABNORMAL HIGH (ref 26.0–34.0)
MCHC: 32.8 g/dL (ref 30.0–36.0)
MCV: 109.7 fL — AB (ref 78.0–100.0)
Platelets: 176 10*3/uL (ref 150–400)
RBC: 2.36 MIL/uL — ABNORMAL LOW (ref 4.22–5.81)
RDW: 12.9 % (ref 11.5–15.5)
WBC: 13.6 10*3/uL — AB (ref 4.0–10.5)

## 2014-01-19 LAB — CULTURE, RESPIRATORY W GRAM STAIN

## 2014-01-19 MED ORDER — ACETYLCYSTEINE 10 % IN SOLN
4.0000 mL | Freq: Two times a day (BID) | RESPIRATORY_TRACT | Status: DC
  Filled 2014-01-19 (×3): qty 4

## 2014-01-19 MED ORDER — POTASSIUM CHLORIDE 20 MEQ/15ML (10%) PO SOLN
40.0000 meq | Freq: Once | ORAL | Status: AC
  Administered 2014-01-19: 40 meq
  Filled 2014-01-19: qty 30

## 2014-01-19 MED ORDER — CALCIUM GLUCONATE 10 % IV SOLN
1.0000 g | Freq: Once | INTRAVENOUS | Status: AC
  Administered 2014-01-19: 1 g via INTRAVENOUS
  Filled 2014-01-19: qty 10

## 2014-01-19 MED ORDER — SODIUM CHLORIDE 0.9 % IV SOLN
INTRAVENOUS | Status: DC
  Administered 2014-01-19 – 2014-01-31 (×4): via INTRAVENOUS

## 2014-01-19 MED ORDER — VITAL AF 1.2 CAL PO LIQD
1000.0000 mL | ORAL | Status: DC
  Administered 2014-01-19: 1000 mL

## 2014-01-19 MED ORDER — FUROSEMIDE 10 MG/ML IJ SOLN: 40.0000 mg | Freq: Every day | INTRAMUSCULAR | Status: DC

## 2014-01-19 MED ORDER — ACETYLCYSTEINE 20 % IN SOLN
4.0000 mL | Freq: Two times a day (BID) | RESPIRATORY_TRACT | Status: DC
  Administered 2014-01-19 – 2014-01-20 (×3): 4 mL via RESPIRATORY_TRACT
  Filled 2014-01-19 (×3): qty 4

## 2014-01-19 MED ORDER — LORAZEPAM 2 MG/ML IJ SOLN
2.0000 mg | INTRAMUSCULAR | Status: DC | PRN
  Administered 2014-01-20 (×2): 2 mg via INTRAVENOUS
  Filled 2014-01-19 (×2): qty 1

## 2014-01-19 MED ORDER — LORAZEPAM 2 MG/ML PO CONC
1.0000 mg | Freq: Two times a day (BID) | ORAL | Status: DC
  Administered 2014-01-19 – 2014-01-21 (×6): 1 mg
  Filled 2014-01-19 (×6): qty 1

## 2014-01-19 MED ORDER — LORAZEPAM 2 MG/ML IJ SOLN
INTRAMUSCULAR | Status: DC
Start: 2014-01-19 — End: 2014-01-19
  Filled 2014-01-19: qty 1

## 2014-01-19 MED ORDER — THIAMINE HCL 100 MG/ML IJ SOLN
500.0000 mg | Freq: Once | INTRAMUSCULAR | Status: AC
  Administered 2014-01-19: 500 mg via INTRAVENOUS
  Filled 2014-01-19: qty 5

## 2014-01-19 MED ORDER — PANTOPRAZOLE SODIUM 40 MG PO PACK
40.0000 mg | PACK | Freq: Every day | ORAL | Status: DC
  Administered 2014-01-19 – 2014-01-25 (×7): 40 mg
  Filled 2014-01-19 (×8): qty 20

## 2014-01-19 NOTE — Progress Notes (Signed)
PULMONARY / CRITICAL CARE MEDICINE   Name: Allen Wood MRN: [id number] DOB: December 15, 1970    ADMISSION DATE:  01/15/2014 CONSULTATION DATE:  01/16/14  REFERRING MD :  Dr. Grandville Silos   CHIEF COMPLAINT:  Acute Respiratory Failure   INITIAL PRESENTATION: 43 y/o M, smoker / ETOH abuse, admitted 12/21 with ongoing fatigue, cough with productive yellow sputum, R posterior back pain, chills & sweats.  Work up found patient to have RLL PNA with positive strep antigen.  Developed worsening respiratory failure 12/22  STUDIES:  12/24 EEG >> neg  SIGNIFICANT EVENTS: 12/21  Admit with RLL PNA 12/22  Decompensated, required intubation for respiratory failure in setting of PNA 12/23  Stable on vent, increased pleural effusion on CXR.  Levo @ 2 mcg, Fent 129mcg, Precedex 0.6.  Seizures overnight, Tmax 105 12/24   Concern for ileus with TF's coming out of mouth overnight.  Reglan dosing  SUBJECTIVE: No seizures  overnight.   Sedation changed to versed / fentanyl. Remains febrile Tolerating Tfs @ 20/h, BM +   VITAL SIGNS: Temp:  [100.2 F (37.9 C)-102.9 F (39.4 C)] 100.6 F (38.1 C) (12/25 0900) Pulse Rate:  [81-109] 90 (12/25 0900) Resp:  [14-21] 14 (12/25 0900) BP: (77-116)/(50-74) 103/63 mmHg (12/25 0900) SpO2:  [89 %-100 %] 99 % (12/25 0900) FiO2 (%):  [40 %-50 %] 40 % (12/25 0811) Weight:  [75.7 kg (166 lb 14.2 oz)] 75.7 kg (166 lb 14.2 oz) (12/25 0500)   HEMODYNAMICS: CVP:  [11 mmHg-14 mmHg] 13 mmHg   VENTILATOR SETTINGS: Vent Mode:  [-] PRVC FiO2 (%):  [40 %-50 %] 40 % Set Rate:  [14 bmp] 14 bmp Vt Set:  [600 mL] 600 mL PEEP:  [5 cmH20] 5 cmH20 Plateau Pressure:  [19 cmH20-22 cmH20] 19 cmH20   INTAKE / OUTPUT:  Intake/Output Summary (Last 24 hours) at 01/19/14 1011 Last data filed at 01/19/14 0900  Gross per 24 hour  Intake 1192.04 ml  Output   4940 ml  Net -3747.96 ml    PHYSICAL EXAMINATION: General:  Thin young adult male in NAD on vent Neuro: Int agitation on  vent, arouses to name, follows commands HEENT:  Dry MM, poor dentition  Cardiovascular:  s1s2 rrr,  no m/r/g  Lungs:  resp's even/non-labored, coarse rhonchi bilaterally R>L Abdomen:  NT, mild distention, bsx4 active  Musculoskeletal:  No acute distress  Skin: warm / dry, no edema   LABS:  CBC  Recent Labs Lab 01/17/14 0540 01/18/14 0300 01/19/14 0520  WBC 10.0 15.0* 13.6*  HGB 9.9* 9.0* 8.5*  HCT 29.2* 27.6* 25.9*  PLT PLATELET CLUMPS NOTED ON SMEAR, COUNT APPEARS ADEQUATE 129* 176   BMET  Recent Labs Lab 01/18/14 0300 01/18/14 1055 01/19/14 0520  NA 140 140 140  K 4.4 4.2 3.4*  CL 112 109 103  CO2 25 26 29   BUN 19 17 13   CREATININE 0.68 0.64 0.57  GLUCOSE 115* 100* 102*   Electrolytes  Recent Labs Lab 01/16/14 0830 01/17/14 0540 01/18/14 0300 01/18/14 1055 01/19/14 0520  CALCIUM  --  6.2* 6.3* 6.5* 6.6*  MG 1.1* 1.8 1.9  --   --    Sepsis Markers  Recent Labs Lab 01/15/14 1614 01/15/14 1807 01/16/14 0830 01/17/14 0540 01/18/14 0300  LATICACIDVEN 7.06* 5.17* 1.4  --   --   PROCALCITON  --   --  25.80 18.52 11.55   ABG  Recent Labs Lab 01/15/14 1930 01/16/14 0826 01/16/14 1020  PHART 7.429 7.489* 7.382  PCO2ART  35.3 33.5* 41.4  PO2ART 79.2* 58.8* 83.0   Liver Enzymes  Recent Labs Lab 01/15/14 1602 01/16/14 0348 01/17/14 0540  AST 50* 87* 44*  ALT 24 29 20   ALKPHOS 114 91 93  BILITOT 0.7 0.5 0.5  ALBUMIN 2.3* 2.0* 1.7*   Glucose  Recent Labs Lab 01/18/14 1150 01/18/14 1558 01/18/14 2015 01/18/14 2344 01/19/14 0332 01/19/14 0818  GLUCAP 90 77 91 106* 103* 96    Imaging Ct Head Wo Contrast  01/18/2014   CLINICAL DATA:  Sudden onset of seizures.  Initial encounter.  EXAM: CT HEAD WITHOUT CONTRAST  TECHNIQUE: Contiguous axial images were obtained from the base of the skull through the vertex without intravenous contrast.  COMPARISON:  None.  FINDINGS: There is no evidence of acute infarction, mass lesion, or intra- or  extra-axial hemorrhage on CT.  Mild cerebellar atrophy is noted.  The brainstem and fourth ventricle are within normal limits. The third and lateral ventricles, and basal ganglia are unremarkable in appearance. The cerebral hemispheres are symmetric in appearance, with normal gray-white differentiation. No mass effect or midline shift is seen.  There is no evidence of fracture; visualized osseous structures are unremarkable in appearance. The orbits are within normal limits. The paranasal sinuses and mastoid air cells are well-aerated. No significant soft tissue abnormalities are seen.  IMPRESSION: 1. No acute intracranial abnormality seen on CT. 2. Mild cerebellar atrophy noted.   Electronically Signed   By: Garald Balding M.D.   On: 01/18/2014 04:41   Dg Chest Port 1 View  01/18/2014   CLINICAL DATA:  Hypoxia  EXAM: PORTABLE CHEST - 1 VIEW  COMPARISON:  January 17, 2014  FINDINGS: Endotracheal tube tip is 5.3 cm above the carina. Central catheter tip is at the cavoatrial junction. Nasogastric tube tip and side port are below the diaphragm. No pneumothorax. There is persistent right pleural effusion with right lower lobe atelectatic type change. There is persistent generalized interstitial edema. The heart size is within normal limits. Pulmonary vascularity is normal. No adenopathy.  IMPRESSION: Tube and catheter positions as described without pneumothorax. Right effusion persists with right lower lobe atelectasis. Generalized edema persists. Suspect underlying congestive heart failure.   Electronically Signed   By: Lowella Grip M.D.   On: 01/18/2014 07:11     ASSESSMENT / PLAN:  PULMONARY OETT 12/22 >>  A: Acute Hypoxic Respiratory Failure - in setting of RLL PNA.  Minimal O2 requirements. RLL CAP - with positive strep antigen R Pleural Effusion - R pleural space assessed with [place] 12/23, not enough fluid for thora.   Tobacco Abuse P:   SBTs Xopenex/Atrovent Q6 + Q3 PRN xopenex  Monitor R  pleural effusion.    CARDIOVASCULAR CVL L IJ TLC 12/22 >>  A:  Severe Sepsis - in setting of PNA.  Required pressors 12/23, off 12/24 Tachycardia  P:  Assess CVP Levophed to support MAP > 65 if needed  Hold lasix  RENAL A:   Hypokalemia  Hypomagnesemia  Hypocalcemia P:   Trend BMP Replace electrolytes as indicated Rpt  calcium gluconate , chk ion Ca 12/26  GASTROINTESTINAL A:   Protein Calorie Malnutrition - albumin 2.0 Ileus - no evidence for bowel obstruction 12/23 P:   TF per nutrition rec's  PPI Reglan x 6 doses then d/c Ok to increase TF 12/25  HEMATOLOGIC A:   Macrocytic Anemia - in setting of ETOH abuse P:  Thiamine / Folate / MVI Trend CBC Heparin for DVT prophylaxis  INFECTIOUS A:   RLL PNA - strep antigen positive  R/O infected pleural space  Fever - tmax 105 12/23 P:   BCx2 12/21 >> UC  12/21 >> ng Sputum 12/21 >> moderate strep pneumoniae >>  Abx: Rocephin, start date 12/21 Abx: Azithro, 12/21>>12/25   tylenol given high temps / seizures, Q6 PRN    ENDOCRINE A:   No acute issues  P:   Monitor glucose on BMP  NEUROLOGIC A:   ETOH Abuse - at risk withdrawal Seizures - x2 overnight 12/23, fever up to 105 +/- ETOH withdrawal Anxiety / Depression  P:   RASS goal: -1 Add Po ativan Fentanyl gtt for pain  Versed gtt in setting of seizure Neurology consult, appreciate input D/c wellbutrin, zoloft  (lower threshold) Keppra loaded per Neuro - no maintenance since EEG neg   FAMILY  - Updates: No family available at time of rounds.   Summary - CPA, ETOH withdrawal, septic shock resolving, secretions ermain an issue  The patient is critically ill with multiple organ systems failure and requires high complexity decision making for assessment and support, frequent evaluation and titration of therapies, application of advanced monitoring technologies and extensive interpretation of multiple databases. Critical Care Time devoted to patient  care services described in this note independent of APP time is 35 minutes.   Kara Mead MD. Shade Flood. [facility] Pager [phone] If no response call [phone]     01/19/2014, 10:11 AM

## 2014-01-19 NOTE — Progress Notes (Signed)
Subjective: No further sz  Exam: Filed Vitals:   01/19/14 1500  BP: 90/52  Pulse: 98  Temp: 100.2 F (37.9 C)  Resp: 14   Gen: In bed, NAD MS: awake, follows commands briskly, answers questions with nod/shakes HQ:[id number], eyes conjugate with stimulation.  Motor: MAEW Sensory:responds to touch in all 4 ext.    Impression: 42 yo M with etoh abuse and sz within 3 days of last drink. I would consider this etoh withdrawal at this time, especially with normla EEG. No further AEDs for now.   Recommendations: 1) will continue to follow.   Roland Rack, MD [facility] [phone]  If 7pm- 7am, please page neurology on call as listed in [place].

## 2014-01-20 ENCOUNTER — Inpatient Hospital Stay (HOSPITAL_COMMUNITY): Payer: Medicaid Other

## 2014-01-20 DIAGNOSIS — F10231 Alcohol dependence with withdrawal delirium: Secondary | ICD-10-CM | POA: Insufficient documentation

## 2014-01-20 DIAGNOSIS — F10931 Alcohol use, unspecified with withdrawal delirium: Secondary | ICD-10-CM | POA: Insufficient documentation

## 2014-01-20 LAB — BASIC METABOLIC PANEL
Anion gap: 4 — ABNORMAL LOW (ref 5–15)
BUN: 13 mg/dL (ref 6–23)
CHLORIDE: 105 meq/L (ref 96–112)
CO2: 32 mmol/L (ref 19–32)
CREATININE: 0.44 mg/dL — AB (ref 0.50–1.35)
Calcium: 6.9 mg/dL — ABNORMAL LOW (ref 8.4–10.5)
GFR calc Af Amer: >90 mL/min (ref >90)
GFR calc non Af Amer: >90 mL/min (ref >90)
Glucose, Bld: 99 mg/dL (ref 70–99)
Potassium: 3.4 mmol/L — ABNORMAL LOW (ref 3.5–5.1)
Sodium: 141 mmol/L (ref 135–145)

## 2014-01-20 LAB — MAGNESIUM: Magnesium: 1.5 mg/dL (ref 1.5–2.5)

## 2014-01-20 LAB — CBC
HCT: 24.1 % — ABNORMAL LOW (ref 39.0–52.0)
HEMOGLOBIN: 7.7 g/dL — AB (ref 13.0–17.0)
MCH: 35.3 pg — AB (ref 26.0–34.0)
MCHC: 32 g/dL (ref 30.0–36.0)
MCV: 110.6 fL — ABNORMAL HIGH (ref 78.0–100.0)
Platelets: 274 10*3/uL (ref 150–400)
RBC: 2.18 MIL/uL — ABNORMAL LOW (ref 4.22–5.81)
RDW: 13 % (ref 11.5–15.5)
WBC: 10.7 10*3/uL — ABNORMAL HIGH (ref 4.0–10.5)

## 2014-01-20 LAB — CULTURE, RESPIRATORY: SPECIAL REQUESTS: NORMAL

## 2014-01-20 LAB — CALCIUM, IONIZED: Calcium, Ion: 1.05 mmol/L — ABNORMAL LOW (ref 1.12–1.32)

## 2014-01-20 LAB — CULTURE, RESPIRATORY W GRAM STAIN

## 2014-01-20 LAB — GLUCOSE, CAPILLARY
GLUCOSE-CAPILLARY: 96 mg/dL (ref 70–99)
Glucose-Capillary: 91 mg/dL (ref 70–99)
Glucose-Capillary: 108 mg/dL — ABNORMAL HIGH (ref 70–99)
Glucose-Capillary: 88 mg/dL (ref 70–99)

## 2014-01-20 LAB — PHOSPHORUS: Phosphorus: 1.6 mg/dL — ABNORMAL LOW (ref 2.3–4.6)

## 2014-01-20 MED ORDER — SODIUM CHLORIDE 0.9 % IV SOLN
25.0000 ug/h | INTRAVENOUS | Status: DC
  Administered 2014-01-20: 90 ug/h via INTRAVENOUS
  Administered 2014-01-20 – 2014-01-21 (×2): 75 ug/h via INTRAVENOUS
  Administered 2014-01-21: 50 ug/h via INTRAVENOUS
  Administered 2014-01-22 (×2): 150 ug/h via INTRAVENOUS
  Administered 2014-01-23: 75 ug/h via INTRAVENOUS
  Administered 2014-01-24: 100 ug/h via INTRAVENOUS
  Administered 2014-01-24: 150 ug/h via INTRAVENOUS
  Administered 2014-01-25: 75 ug/h via INTRAVENOUS
  Administered 2014-01-26 – 2014-01-27 (×2): 100 ug/h via INTRAVENOUS
  Administered 2014-01-27: 125 ug/h via INTRAVENOUS
  Administered 2014-01-30 (×2): 150 ug/h via INTRAVENOUS
  Administered 2014-01-31: 200 ug/h via INTRAVENOUS
  Administered 2014-01-31: 300 ug/h via INTRAVENOUS
  Administered 2014-02-01: 200 ug/h via INTRAVENOUS
  Administered 2014-02-01: 225 ug/h via INTRAVENOUS
  Administered 2014-02-01: 200 ug/h via INTRAVENOUS
  Administered 2014-02-02: 25 ug/h via INTRAVENOUS
  Administered 2014-02-02: 200 ug/h via INTRAVENOUS
  Filled 2014-01-20 (×21): qty 50

## 2014-01-20 MED ORDER — MIDAZOLAM HCL 2 MG/2ML IJ SOLN
2.0000 mg | Freq: Once | INTRAMUSCULAR | Status: AC
  Administered 2014-01-20: 2 mg via INTRAVENOUS

## 2014-01-20 MED ORDER — ROCURONIUM BROMIDE 50 MG/5ML IV SOLN
INTRAVENOUS | Status: AC
  Filled 2014-01-20: qty 2

## 2014-01-20 MED ORDER — FENTANYL CITRATE 0.05 MG/ML IJ SOLN: 100.0000 ug | Freq: Once | INTRAMUSCULAR | Status: AC

## 2014-01-20 MED ORDER — FENTANYL CITRATE 0.05 MG/ML IJ SOLN
INTRAMUSCULAR | Status: DC
Start: 2014-01-20 — End: 2014-01-20
  Filled 2014-01-20: qty 2

## 2014-01-20 MED ORDER — MIDAZOLAM BOLUS VIA INFUSION
1.0000 mg | INTRAVENOUS | Status: DC | PRN
  Administered 2014-01-22 – 2014-01-23 (×4): 2 mg via INTRAVENOUS
  Administered 2014-01-25: 1 mg via INTRAVENOUS
  Administered 2014-01-25: 2 mg via INTRAVENOUS
  Administered 2014-01-25: 1 mg via INTRAVENOUS
  Administered 2014-01-25 – 2014-01-30 (×8): 2 mg via INTRAVENOUS
  Filled 2014-01-20 (×16): qty 2

## 2014-01-20 MED ORDER — MIDAZOLAM HCL 5 MG/ML IJ SOLN
0.0000 mg/h | INTRAMUSCULAR | Status: DC
  Administered 2014-01-20: 7 mg/h via INTRAVENOUS
  Administered 2014-01-20: 2 mg/h via INTRAVENOUS
  Administered 2014-01-20 – 2014-01-21 (×2): 4 mg/h via INTRAVENOUS
  Administered 2014-01-21: 6 mg/h via INTRAVENOUS
  Administered 2014-01-22: 5 mg/h via INTRAVENOUS
  Administered 2014-01-22 – 2014-01-23 (×2): 8 mg/h via INTRAVENOUS
  Administered 2014-01-23: 2 mg/h via INTRAVENOUS
  Administered 2014-01-24: 3 mg/h via INTRAVENOUS
  Administered 2014-01-25: 5 mg/h via INTRAVENOUS
  Administered 2014-01-25: 2 mg/h via INTRAVENOUS
  Administered 2014-01-27 (×2): 6 mg/h via INTRAVENOUS
  Administered 2014-01-27: 4 mg/h via INTRAVENOUS
  Administered 2014-01-28 (×3): 6 mg/h via INTRAVENOUS
  Administered 2014-01-29: 10 mg/h via INTRAVENOUS
  Administered 2014-01-29 (×2): 8 mg/h via INTRAVENOUS
  Administered 2014-01-30 (×2): 10 mg/h via INTRAVENOUS
  Administered 2014-01-30: 8 mg/h via INTRAVENOUS
  Administered 2014-01-30: 10 mg/h via INTRAVENOUS
  Administered 2014-01-31: 8 mg/h via INTRAVENOUS
  Administered 2014-01-31: 10 mg/h via INTRAVENOUS
  Administered 2014-01-31: 2 mg/h via INTRAVENOUS
  Administered 2014-02-01: 5 mg/h via INTRAVENOUS
  Administered 2014-02-01: 8 mg/h via INTRAVENOUS
  Administered 2014-02-01: 5 mg/h via INTRAVENOUS
  Administered 2014-02-01: 8 mg/h via INTRAVENOUS
  Filled 2014-01-20 (×39): qty 10

## 2014-01-20 MED ORDER — LORAZEPAM 2 MG/ML IJ SOLN
1.0000 mg | INTRAMUSCULAR | Status: DC | PRN
  Administered 2014-01-20 – 2014-01-23 (×5): 2 mg via INTRAVENOUS
  Filled 2014-01-20 (×6): qty 1

## 2014-01-20 MED ORDER — SUCCINYLCHOLINE CHLORIDE 20 MG/ML IJ SOLN
INTRAMUSCULAR | Status: AC
  Filled 2014-01-20: qty 1

## 2014-01-20 MED ORDER — MAGNESIUM SULFATE 2 GM/50ML IV SOLN
2.0000 g | Freq: Once | INTRAVENOUS | Status: AC
  Administered 2014-01-20: 2 g via INTRAVENOUS
  Filled 2014-01-20: qty 50

## 2014-01-20 MED ORDER — VITAL HIGH PROTEIN PO LIQD
1000.0000 mL | ORAL | Status: DC
  Filled 2014-01-20: qty 1000

## 2014-01-20 MED ORDER — ETOMIDATE 2 MG/ML IV SOLN
INTRAVENOUS | Status: AC
  Filled 2014-01-20: qty 20

## 2014-01-20 MED ORDER — THIAMINE HCL 100 MG/ML IJ SOLN
500.0000 mg | Freq: Three times a day (TID) | INTRAVENOUS | Status: AC
  Administered 2014-01-20 – 2014-01-21 (×6): 500 mg via INTRAVENOUS
  Filled 2014-01-20 (×7): qty 5

## 2014-01-20 MED ORDER — POTASSIUM PHOSPHATES 15 MMOLE/5ML IV SOLN
30.0000 mmol | Freq: Once | INTRAVENOUS | Status: AC
  Administered 2014-01-20: 30 mmol via INTRAVENOUS
  Filled 2014-01-20: qty 10

## 2014-01-20 MED ORDER — FENTANYL BOLUS VIA INFUSION
50.0000 ug | INTRAVENOUS | Status: DC | PRN
  Administered 2014-01-21: 50 ug via INTRAVENOUS
  Administered 2014-01-23: 100 ug via INTRAVENOUS
  Administered 2014-01-23 – 2014-01-30 (×10): 50 ug via INTRAVENOUS
  Administered 2014-01-30 (×2): 100 ug via INTRAVENOUS
  Administered 2014-01-30: 50 ug via INTRAVENOUS
  Filled 2014-01-20: qty 50

## 2014-01-20 MED ORDER — ETOMIDATE 2 MG/ML IV SOLN
0.3000 mg/kg | Freq: Once | INTRAVENOUS | Status: AC
  Administered 2014-01-20: 20 mg via INTRAVENOUS
  Filled 2014-01-20: qty 11.4

## 2014-01-20 MED ORDER — VITAMIN B-1 100 MG PO TABS
100.0000 mg | ORAL_TABLET | Freq: Every day | ORAL | Status: DC
  Administered 2014-01-26 – 2014-02-04 (×8): 100 mg via ORAL
  Filled 2014-01-20 (×11): qty 1

## 2014-01-20 MED ORDER — DOCUSATE SODIUM 50 MG/5ML PO LIQD
100.0000 mg | Freq: Two times a day (BID) | ORAL | Status: DC | PRN
  Administered 2014-01-20 – 2014-01-24 (×3): 100 mg
  Filled 2014-01-20 (×4): qty 10

## 2014-01-20 MED ORDER — DEXMEDETOMIDINE HCL IN NACL 400 MCG/100ML IV SOLN
0.4000 ug/kg/h | INTRAVENOUS | Status: DC
  Administered 2014-01-20 (×2): 0.4 ug/kg/h via INTRAVENOUS
  Administered 2014-01-21 (×3): 1.1 ug/kg/h via INTRAVENOUS
  Administered 2014-01-21 (×2): 0.9 ug/kg/h via INTRAVENOUS
  Administered 2014-01-21: 1.1 ug/kg/h via INTRAVENOUS
  Administered 2014-01-21: 0.5 ug/kg/h via INTRAVENOUS
  Administered 2014-01-21: 1.1 ug/kg/h via INTRAVENOUS
  Administered 2014-01-22: 1.2 ug/kg/h via INTRAVENOUS
  Administered 2014-01-22 (×2): 1.1 ug/kg/h via INTRAVENOUS
  Administered 2014-01-22: 1.2 ug/kg/h via INTRAVENOUS
  Administered 2014-01-22: 1.1 ug/kg/h via INTRAVENOUS
  Administered 2014-01-22 – 2014-01-27 (×27): 1.2 ug/kg/h via INTRAVENOUS
  Administered 2014-01-27: 0.8 ug/kg/h via INTRAVENOUS
  Administered 2014-01-28 (×2): 1.2 ug/kg/h via INTRAVENOUS
  Administered 2014-01-28: 1 ug/kg/h via INTRAVENOUS
  Administered 2014-01-28: 1.2 ug/kg/h via INTRAVENOUS
  Administered 2014-01-28: 1 ug/kg/h via INTRAVENOUS
  Administered 2014-01-29: 2.4 ug/kg/h via INTRAVENOUS
  Administered 2014-01-29: 1.2 ug/kg/h via INTRAVENOUS
  Administered 2014-01-29: 2.4 ug/kg/h via INTRAVENOUS
  Administered 2014-01-29: 2 ug/kg/h via INTRAVENOUS
  Administered 2014-01-29: 1.2 ug/kg/h via INTRAVENOUS
  Administered 2014-01-29: 2.4 ug/kg/h via INTRAVENOUS
  Administered 2014-01-29: 2 ug/kg/h via INTRAVENOUS
  Administered 2014-01-29: 1.2 ug/kg/h via INTRAVENOUS
  Administered 2014-01-30: 2 ug/kg/h via INTRAVENOUS
  Administered 2014-01-30: 2.4 ug/kg/h via INTRAVENOUS
  Administered 2014-01-30: 2 ug/kg/h via INTRAVENOUS
  Filled 2014-01-20 (×3): qty 100
  Filled 2014-01-20: qty 50
  Filled 2014-01-20 (×7): qty 100
  Filled 2014-01-20: qty 50
  Filled 2014-01-20: qty 100
  Filled 2014-01-20: qty 50
  Filled 2014-01-20: qty 100
  Filled 2014-01-20: qty 50
  Filled 2014-01-20: qty 100
  Filled 2014-01-20: qty 50
  Filled 2014-01-20: qty 100
  Filled 2014-01-20: qty 50
  Filled 2014-01-20 (×12): qty 100
  Filled 2014-01-20: qty 50
  Filled 2014-01-20 (×2): qty 100
  Filled 2014-01-20: qty 50
  Filled 2014-01-20 (×5): qty 100
  Filled 2014-01-20 (×2): qty 50
  Filled 2014-01-20 (×11): qty 100
  Filled 2014-01-20: qty 50
  Filled 2014-01-20: qty 100
  Filled 2014-01-20: qty 50
  Filled 2014-01-20 (×3): qty 100

## 2014-01-20 MED ORDER — FENTANYL CITRATE 0.05 MG/ML IJ SOLN: 50.0000 ug | Freq: Once | INTRAMUSCULAR | Status: AC

## 2014-01-20 MED ORDER — DOCUSATE SODIUM 50 MG/5ML PO LIQD
100.0000 mg | Freq: Two times a day (BID) | ORAL | Status: DC | PRN
  Administered 2014-01-21 – 2014-01-22 (×3): 100 mg via ORAL
  Filled 2014-01-20 (×5): qty 10

## 2014-01-20 MED ORDER — MIDAZOLAM HCL 2 MG/2ML IJ SOLN
INTRAMUSCULAR | Status: AC
  Filled 2014-01-20: qty 4

## 2014-01-20 MED ORDER — THIAMINE HCL 100 MG/ML IJ SOLN
100.0000 mg | Freq: Every day | INTRAMUSCULAR | Status: DC
  Administered 2014-01-22 – 2014-02-04 (×7): 100 mg via INTRAVENOUS
  Filled 2014-01-20: qty 1
  Filled 2014-01-20: qty 2
  Filled 2014-01-20: qty 1
  Filled 2014-01-20 (×2): qty 2
  Filled 2014-01-20 (×2): qty 1
  Filled 2014-01-20: qty 2
  Filled 2014-01-20: qty 1
  Filled 2014-01-20: qty 2
  Filled 2014-01-20 (×5): qty 1

## 2014-01-20 MED ORDER — FENTANYL CITRATE 0.05 MG/ML IJ SOLN
INTRAMUSCULAR | Status: AC
  Administered 2014-01-20: 100 ug via INTRAVENOUS
  Filled 2014-01-20: qty 2

## 2014-01-20 MED ORDER — VITAL AF 1.2 CAL PO LIQD
1000.0000 mL | ORAL | Status: DC
  Administered 2014-01-21 – 2014-01-24 (×2): 1000 mL
  Filled 2014-01-20 (×4): qty 1000

## 2014-01-20 MED ORDER — VITAL AF 1.2 CAL PO LIQD
1000.0000 mL | ORAL | Status: DC
  Administered 2014-01-20: 1000 mL
  Filled 2014-01-20: qty 1000

## 2014-01-20 MED ORDER — LIDOCAINE HCL (CARDIAC) 20 MG/ML IV SOLN
INTRAVENOUS | Status: AC
  Filled 2014-01-20: qty 5

## 2014-01-20 NOTE — Procedures (Signed)
Intubation Procedure Note Allen Wood [id number] [birth year]  Procedure: Intubation Indications: Respiratory insufficiency  Procedure Details Consent: Risks of procedure as well as the alternatives and risks of each were explained to the (patient/caregiver).  Consent for procedure obtained. Time Out: Verified patient identification, verified procedure, site/side was marked, verified correct patient position, special equipment/implants available, medications/allergies/relevent history reviewed, required imaging and test results available.  Performed  Maximum sterile technique was used including gloves, gown, hand hygiene and mask.  MAC and 3    Evaluation Hemodynamic Status: BP stable throughout; O2 sats: stable throughout Patient's Current Condition: stable Complications: No apparent complications Patient did tolerate procedure well. Chest X-ray ordered to verify placement.  CXR: pending.   Allen Wood V. 01/20/2014

## 2014-01-20 NOTE — Progress Notes (Signed)
Pt extubated per request of Dr.Alva. Post extubation heart rate was in 140s O2 saturations were in the low 80s on 6L. I Placed the patient on Non rebreather. He had thick secretions which he was unable to effectively cough out. NTS with Dr.Alva at beside. NTS did not seem to help. Pt was still in distress. Dr.Alva reintubated the patient with a 7.5 tube 25 at the lip confirmed placement of the tube with auscultation, capnography and chest x ray.

## 2014-01-20 NOTE — Progress Notes (Signed)
PULMONARY / CRITICAL CARE MEDICINE   Name: Allen Wood MRN: [id number] DOB: 1970/03/30    ADMISSION DATE:  01/15/2014 CONSULTATION DATE:  01/16/14  REFERRING MD :  Dr. Grandville Silos   CHIEF COMPLAINT:  Acute Respiratory Failure   INITIAL PRESENTATION: 43 y/o M, smoker / ETOH abuse, admitted 12/21 with ongoing fatigue, cough with productive yellow sputum, R posterior back pain, chills & sweats.  Work up found patient to have RLL PNA with positive strep antigen.  Developed worsening respiratory failure 12/22  STUDIES:  12/24 EEG >> neg  SIGNIFICANT EVENTS: 12/21  Admit with RLL PNA 12/22  Decompensated, required intubation for respiratory failure in setting of PNA 12/23  Stable on vent, increased pleural effusion on CXR.  Levo @ 2 mcg, Fent 120mcg, Precedex 0.6.  Seizures overnight, Tmax 105 12/24   Concern for ileus with TF's coming out of mouth overnight.  Reglan dosing  SUBJECTIVE: No seizures   Low gr fever  Awake & calmer this am   VITAL SIGNS: Temp:  [100 F (37.8 C)-101.1 F (38.4 C)] 100.4 F (38 C) (12/26 0900) Pulse Rate:  [90-121] 108 (12/26 0900) Resp:  [11-20] 11 (12/26 0900) BP: (82-112)/(45-72) 112/68 mmHg (12/26 0900) SpO2:  [92 %-100 %] 92 % (12/26 0900) FiO2 (%):  [40 %] 40 % (12/26 0900) Weight:  [76 kg (167 lb 8.8 oz)] 76 kg (167 lb 8.8 oz) (12/26 0400)   HEMODYNAMICS: CVP:  [12 mmHg-18 mmHg] 18 mmHg   VENTILATOR SETTINGS: Vent Mode:  [-] PSV;CPAP FiO2 (%):  [40 %] 40 % Set Rate:  [14 bmp] 14 bmp Vt Set:  [600 mL] 600 mL PEEP:  [5 cmH20] 5 cmH20 Plateau Pressure:  [18 cmH20-21 cmH20] 20 cmH20   INTAKE / OUTPUT:  Intake/Output Summary (Last 24 hours) at 01/20/14 0955 Last data filed at 01/20/14 0900  Gross per 24 hour  Intake 2600.38 ml  Output   2325 ml  Net 275.38 ml    PHYSICAL EXAMINATION: General:  Thin young adult male in NAD on vent Neuro: Int agitation on vent,RASS0, follows commands HEENT:  Dry MM, poor dentition   Cardiovascular:  s1s2 rrr,  no m/r/g  Lungs:  resp's even/non-labored, coarse rhonchi bilaterally R>L Abdomen:  NT, mild distention, bsx4 active  Musculoskeletal:  No acute distress  Skin: warm / dry, no edema   LABS:  CBC  Recent Labs Lab 01/18/14 0300 01/19/14 0520 01/20/14 0330  WBC 15.0* 13.6* 10.7*  HGB 9.0* 8.5* 7.7*  HCT 27.6* 25.9* 24.1*  PLT 129* 176 274   BMET  Recent Labs Lab 01/18/14 1055 01/19/14 0520 01/20/14 0330  NA 140 140 141  K 4.2 3.4* 3.4*  CL 109 103 105  CO2 26 29 32  BUN 17 13 13   CREATININE 0.64 0.57 0.44*  GLUCOSE 100* 102* 99   Electrolytes  Recent Labs Lab 01/17/14 0540 01/18/14 0300 01/18/14 1055 01/19/14 0520 01/20/14 0330  CALCIUM 6.2* 6.3* 6.5* 6.6* 6.9*  MG 1.8 1.9  --   --  1.5  PHOS  --   --   --   --  1.6*   Sepsis Markers  Recent Labs Lab 01/15/14 1614 01/15/14 1807 01/16/14 0830 01/17/14 0540 01/18/14 0300  LATICACIDVEN 7.06* 5.17* 1.4  --   --   PROCALCITON  --   --  25.80 18.52 11.55   ABG  Recent Labs Lab 01/15/14 1930 01/16/14 0826 01/16/14 1020  PHART 7.429 7.489* 7.382  PCO2ART 35.3 33.5* 41.4  PO2ART 79.2*  58.8* 83.0   Liver Enzymes  Recent Labs Lab 01/15/14 1602 01/16/14 0348 01/17/14 0540  AST 50* 87* 44*  ALT 24 29 20   ALKPHOS 114 91 93  BILITOT 0.7 0.5 0.5  ALBUMIN 2.3* 2.0* 1.7*   Glucose  Recent Labs Lab 01/18/14 2344 01/19/14 0332 01/19/14 0818 01/19/14 1300 01/19/14 1741 01/19/14 1938  GLUCAP 106* 103* 96 94 88 97    Imaging Dg Chest Port 1 View  01/19/2014   CLINICAL DATA:  Community-acquired pneumonia  EXAM: PORTABLE CHEST - 1 VIEW  COMPARISON:  01/18/2014  FINDINGS: Cardiac shadow is stable. A left jugular line, endotracheal tube and nasogastric catheter are all again identified and stable in appearance. Persistent right-sided pleural effusion with underlying right lower lobe infiltrate is seen. This is stable from the prior exam. Improved aeration is noted in  the left lung when compared with the prior study. No new focal abnormality is seen.  IMPRESSION: Stable changes in the right base consistent with effusion and infiltrate.  Tubes and lines in satisfactory position.   Electronically Signed   By: Inez Catalina M.D.   On: 01/19/2014 07:17     ASSESSMENT / PLAN:  PULMONARY OETT 12/22 >>  A: Acute Hypoxic Respiratory Failure - in setting of RLL PNA.  Minimal O2 requirements. RLL CAP - with positive strep antigen R Pleural Effusion - R pleural space assessed with [place] 12/23, not enough fluid for thora.   Tobacco Abuse P:   SBTs Xopenex/Atrovent Q6 + Q3 PRN xopenex  Monitor R pleural effusion.    CARDIOVASCULAR CVL L IJ TLC 12/22 >>  A:  Severe Sepsis - in setting of PNA.  Required pressors 12/23, off 12/24 Tachycardia  P:  Levophed off Hold lasix  RENAL A:   Hypokalemia  Hypomagnesemia  Hypocalcemia P:   Trend BMP Replace electrolytes as indicated FU ion Ca 12/26  GASTROINTESTINAL A:   Protein Calorie Malnutrition - albumin 2.0 Ileus - no evidence for bowel obstruction 12/23 -improved with reglan P:   TF held PPI    HEMATOLOGIC A:   Macrocytic Anemia - in setting of ETOH abuse P:  Thiamine / Folate / MVI Trend CBC Heparin for DVT prophylaxis   INFECTIOUS A:   RLL PNA - strep antigen positive  R/O infected pleural space  Fever - tmax 105 12/23 P:   BCx2 12/21 >> UC  12/21 >> ng Sputum 12/21 >> moderate strep pneumoniae >>  Abx: Rocephin, start date 12/21 Abx: Azithro, 12/21>>12/25   tylenol given high temps / seizures, Q6 PRN    ENDOCRINE A:   No acute issues  P:   Monitor glucose on BMP  NEUROLOGIC A:   ETOH Abuse - at risk withdrawal Seizures - x2 overnight 12/23, fever up to 105 +/- ETOH withdrawal Anxiety / Depression  Concern for wernicke's P:   RASS goal: -1 Add Po ativan Fentanyl int for pain  Neurology consult, appreciate input D/c wellbutrin, zoloft  (lower threshold) Keppra  loaded per Neuro - no maintenance since EEG neg High dose thiamine per neuro   FAMILY  - Updates: No family available at time of rounds.   Summary - CAP, ETOH withdrawal, septic shock resolved, proceed with extubation  The patient is critically ill with multiple organ systems failure and requires high complexity decision making for assessment and support, frequent evaluation and titration of therapies, application of advanced monitoring technologies and extensive interpretation of multiple databases. Critical Care Time devoted to patient care services described  in this note independent of APP time is 35 minutes.   Kara Mead MD. Shade Flood. [facility] Pager [phone] If no response call [phone]     01/20/2014, 9:55 AM

## 2014-01-20 NOTE — Progress Notes (Signed)
[facility] Progress Note Patient Name: Allen Wood DOB: July 09, 1970 MRN: [id number]   Date of Service  01/20/2014  HPI/Events of Note  Hypokalemia and hypophos  eICU Interventions  Potassium and phos replaced     Intervention Category Intermediate Interventions: Electrolyte abnormality - evaluation and management  DETERDING,ELIZABETH 01/20/2014, 4:25 AM

## 2014-01-20 NOTE — Progress Notes (Signed)
Subjective: No further sz  Exam: Filed Vitals:   01/20/14 0900  BP: 112/68  Pulse: 108  Temp: 100.4 F (38 C)  Resp: 11   Gen: In bed, NAD MS: awake, follows commands briskly, answers questions with nod/shakes VO:[id number], eyes dysconjugate.  Motor: MAEW, tremulous.  Sensory:responds to touch in all 4 ext.    Impression: 42 yo M with etoh abuse and sz within 3 days of last drink. I would consider this etoh withdrawal at this time, especially with normla EEG. No further AEDs for now. He has had significant weight loss making me concerned that malnutrition may have been a problem for him which greatly increases my concern that there may be a component of wernickes, and would favor repletion with high dose thiamine. Started yesterday.  Recommendations: 1) will continue to follow.  2) Thiamine 500mg  TID today and tomorrow. Then 100mg  daily.    Roland Rack, MD [facility] [phone]  If 7pm- 7am, please page neurology on call as listed in [place].

## 2014-01-20 NOTE — Progress Notes (Signed)
[facility] Progress Note Patient Name: Allen Wood DOB: 1970/02/25 MRN: [id number]   Date of Service  01/20/2014  HPI/Events of Note  Agitation breaking throu versed drip and prn ativan boluses   eICU Interventions  Restart precedex      Intervention Category Major Interventions: Change in mental status - evaluation and management  Christinia Gully 01/20/2014, 7:57 PM

## 2014-01-20 NOTE — Progress Notes (Signed)
NUTRITION FOLLOW UP/CONSULT   Intervention:   Initiate Vital AF 1.2 @ 20 ml/hr via OGT and increase by 10 ml every 4 hours to goal rate of 75 ml/hr.   Tube feeding regimen provides 2160 kcal (100% of needs), 135 grams of protein, and 1450 ml of H2O.   -RD to continue to monitor  Nutrition Dx:   Inadequate oral intake related to inability to eat as evidenced by NPO; ongoing  Goal:   Pt to meet >/= 90% of their estimated nutrition needs; not met  Monitor:   Weight trend, NPO, vent status/settings, labs, TF initiation and tolerance  Assessment:   43 y/o M, smoker / ETOH abuse, admitted 12/21 with ongoing fatigue, cough with productive yellow sputum, R posterior back pain, chills & sweats. Work up found patient to have RLL PNA with positive strep antigen. Developed worsening respiratory failure 12/22  TF held d/t possible extubation. Extubation attempted but failed. Pt was re-intubated. Per MD note, concerned for ileus. TF coming out of patient's mouth overnight.  Per RN, pt was tolerating TF fine with no problems.  RD to initiate same TF formula as before extubation attempt. Fever reduced, needs re-estimated and rate adjusted accordingly.  Patient is currently intubated on ventilator support MV: 9.0 L/min Temp (24hrs), Avg:100.5 F (38.1 C), Min:100 F (37.8 C), Max:101.1 F (38.4 C)   Height: Ht Readings from Last 1 Encounters:  01/15/14 _0  (1.803 m)    Weight Status:   Wt Readings from Last 1 Encounters:  01/20/14 167 lb 8.8 oz (76 kg)    Re-estimated needs:  Kcal: 2080 Protein: 120-130 g Fluid: Per MD  Skin: Intact  Diet Order: Diet NPO time specified   Intake/Output Summary (Last 24 hours) at 01/20/14 1211 Last data filed at 01/20/14 0900  Gross per 24 hour  Intake 2184.38 ml  Output   1825 ml  Net 359.38 ml    Last BM: 12/24   Labs:   Recent Labs Lab 01/17/14 0540 01/18/14 0300 01/18/14 1055 01/19/14 0520 01/20/14 0330  NA 136 140  140 140 141  K 3.3* 4.4 4.2 3.4* 3.4*  CL 103 112 109 103 105  CO2 _1 32  BUN _2 CREATININE 0.63 0.68 0.64 0.57 0.44*  CALCIUM 6.2* 6.3* 6.5* 6.6* 6.9*  MG 1.8 1.9  --   --  1.5  PHOS  --   --   --   --  1.6*  GLUCOSE 100* 115* 100* 102* 99    CBG (last 3)   Recent Labs  01/19/14 1300 01/19/14 1741 01/19/14 1938  GLUCAP 94 88 97    Scheduled Meds: . antiseptic oral rinse  7 mL Mouth Rinse QID  . cefTRIAXone (ROCEPHIN)  IV  2 g Intravenous Q24H  . chlorhexidine  15 mL Mouth Rinse BID  . etomidate      . feeding supplement (VITAL HIGH PROTEIN)  1,000 mL Per Tube Q24H  . fentaNYL      . fentaNYL  100 mcg Intravenous Once  . fentaNYL  50 mcg Intravenous Once  . folic acid  1 mg Oral Daily  . guaiFENesin  20 mL Per Tube 6 times per day  . heparin  5,000 Units Subcutaneous 3 times per day  . ipratropium  0.5 mg Nebulization Q6H  . levalbuterol  0.63 mg Nebulization Q6H  . lidocaine (cardiac) 100 mg/32m      . LORazepam  1 mg Per Tube  BID  . midazolam      . multivitamin  5 mL Per Tube Daily  . nicotine  14 mg Transdermal Daily  . pantoprazole sodium  40 mg Per Tube Q1200  . rocuronium      . succinylcholine      . [START ON 01/22/2014] thiamine  100 mg Oral Daily   Or  . [START ON 01/22/2014] thiamine  100 mg Intravenous Daily  . thiamine IV  500 mg Intravenous TID    Continuous Infusions: . sodium chloride 10 mL/hr at 01/19/14 2252  . fentaNYL infusion INTRAVENOUS 75 mcg/hr (01/20/14 1157)  . midazolam (VERSED) infusion 4 mg/hr (01/20/14 1157)  . norepinephrine (LEVOPHED) Adult infusion Stopped (01/19/14 1208)    Clayton Bibles, MS, RD, LDN Pager: [phone] After Hours Pager: [phone]

## 2014-01-20 NOTE — Plan of Care (Signed)
Problem: Phase I Progression Outcomes Goal: Patient tolerating weaning plan Outcome: Progressing Patient extubated at 0850 but had to be re-intubated also immediately.

## 2014-01-21 ENCOUNTER — Inpatient Hospital Stay (HOSPITAL_COMMUNITY): Payer: Medicaid Other

## 2014-01-21 LAB — GLUCOSE, CAPILLARY
GLUCOSE-CAPILLARY: 121 mg/dL — AB (ref 70–99)
GLUCOSE-CAPILLARY: 111 mg/dL — AB (ref 70–99)
Glucose-Capillary: 89 mg/dL (ref 70–99)
Glucose-Capillary: 100 mg/dL — ABNORMAL HIGH (ref 70–99)
Glucose-Capillary: 76 mg/dL (ref 70–99)
Glucose-Capillary: 92 mg/dL (ref 70–99)
Glucose-Capillary: 96 mg/dL (ref 70–99)
Glucose-Capillary: 109 mg/dL — ABNORMAL HIGH (ref 70–99)

## 2014-01-21 LAB — CBC
HCT: 24.3 % — ABNORMAL LOW (ref 39.0–52.0)
HEMOGLOBIN: 7.8 g/dL — AB (ref 13.0–17.0)
MCH: 35.5 pg — ABNORMAL HIGH (ref 26.0–34.0)
MCHC: 32.1 g/dL (ref 30.0–36.0)
MCV: 110.5 fL — ABNORMAL HIGH (ref 78.0–100.0)
Platelets: 456 10*3/uL — ABNORMAL HIGH (ref 150–400)
RBC: 2.2 MIL/uL — ABNORMAL LOW (ref 4.22–5.81)
RDW: 12.9 % (ref 11.5–15.5)
WBC: 11.3 10*3/uL — AB (ref 4.0–10.5)

## 2014-01-21 LAB — BASIC METABOLIC PANEL
Anion gap: 2 — ABNORMAL LOW (ref 5–15)
BUN: 11 mg/dL (ref 6–23)
CHLORIDE: 106 meq/L (ref 96–112)
CO2: 33 mmol/L — ABNORMAL HIGH (ref 19–32)
Calcium: 7.1 mg/dL — ABNORMAL LOW (ref 8.4–10.5)
Creatinine, Ser: 0.43 mg/dL — ABNORMAL LOW (ref 0.50–1.35)
GFR calc Af Amer: >90 mL/min (ref >90)
GFR calc non Af Amer: >90 mL/min (ref >90)
GLUCOSE: 106 mg/dL — AB (ref 70–99)
POTASSIUM: 3.6 mmol/L (ref 3.5–5.1)
Sodium: 141 mmol/L (ref 135–145)

## 2014-01-21 LAB — PHOSPHORUS: PHOSPHORUS: 2.8 mg/dL (ref 2.3–4.6)

## 2014-01-21 LAB — MAGNESIUM: Magnesium: 1.9 mg/dL (ref 1.5–2.5)

## 2014-01-21 MED ORDER — ACETYLCYSTEINE 20 % IN SOLN
2.0000 mL | Freq: Three times a day (TID) | RESPIRATORY_TRACT | Status: AC
  Administered 2014-01-21: 2 mL via RESPIRATORY_TRACT
  Administered 2014-01-21 – 2014-01-22 (×2): via RESPIRATORY_TRACT
  Filled 2014-01-21 (×4): qty 4

## 2014-01-21 MED ORDER — FUROSEMIDE 10 MG/ML IJ SOLN
40.0000 mg | Freq: Two times a day (BID) | INTRAMUSCULAR | Status: DC
  Administered 2014-01-21 (×2): 40 mg via INTRAVENOUS
  Filled 2014-01-21 (×2): qty 4

## 2014-01-21 MED ORDER — POTASSIUM CHLORIDE 20 MEQ/15ML (10%) PO SOLN
40.0000 meq | Freq: Once | ORAL | Status: AC
  Administered 2014-01-21: 40 meq
  Filled 2014-01-21: qty 30

## 2014-01-21 MED ORDER — ENOXAPARIN SODIUM 30 MG/0.3ML ~~LOC~~ SOLN
30.0000 mg | Freq: Every day | SUBCUTANEOUS | Status: DC
  Filled 2014-01-21 (×2): qty 0.3

## 2014-01-21 NOTE — Progress Notes (Signed)
PULMONARY / CRITICAL CARE MEDICINE   Name: Allen Wood MRN: [id number] DOB: [birth year]    ADMISSION DATE:  01/15/2014 CONSULTATION DATE:  01/16/14  REFERRING MD :  Dr. Grandville Silos   CHIEF COMPLAINT:  Acute Respiratory Failure   INITIAL PRESENTATION: 43 y/o M, smoker / ETOH abuse, admitted 12/21 with ongoing fatigue, cough with productive yellow sputum, R posterior back pain, chills & sweats.  Work up found patient to have RLL PNA with positive strep antigen.  Developed worsening respiratory failure 12/22  STUDIES:  12/24 EEG >> neg  SIGNIFICANT EVENTS: 12/21  Admit with RLL PNA 12/22  Decompensated, required intubation for respiratory failure in setting of PNA 12/23  Stable on vent, increased pleural effusion on CXR.  Levo @ 2 mcg, Fent 133mcg, Precedex 0.6.  Seizures overnight, Tmax 105 12/24   Concern for ileus with TF's coming out of mouth overnight.  Reglan dosing 12/26 reintubated due to inability to clear secretions  SUBJECTIVE: Febrile Awake & calmer this am No BM  VITAL SIGNS: Temp:  [99.5 F (37.5 C)-102.2 F (39 C)] 102.2 F (39 C) (12/27 0900) Pulse Rate:  [92-158] 101 (12/27 0930) Resp:  [12-29] 24 (12/27 0930) BP: (86-155)/(39-88) 102/57 mmHg (12/27 0930) SpO2:  [89 %-99 %] 92 % (12/27 0930) FiO2 (%):  [40 %] 40 % (12/27 0930) Weight:  [76 kg (167 lb 8.8 oz)] 76 kg (167 lb 8.8 oz) (12/27 0423)   HEMODYNAMICS: CVP:  [11 mmHg] 11 mmHg   VENTILATOR SETTINGS: Vent Mode:  [-] CPAP;PSV FiO2 (%):  [40 %] 40 % Set Rate:  [14 bmp] 14 bmp Vt Set:  [600 mL] 600 mL PEEP:  [5 cmH20] 5 cmH20 Pressure Support:  [5 cmH20] 5 cmH20 Plateau Pressure:  [19 cmH20-24 cmH20] 20 cmH20   INTAKE / OUTPUT:  Intake/Output Summary (Last 24 hours) at 01/21/14 1017 Last data filed at 01/21/14 0900  Gross per 24 hour  Intake 1732.97 ml  Output   1820 ml  Net -87.03 ml    PHYSICAL EXAMINATION: General:  Thin young adult male in mild distress on vent Neuro: Int agitation  on vent,RASS0, follows commands HEENT:  Dry MM, poor dentition  Cardiovascular:  s1s2 rrr,  no m/r/g  Lungs:  resp's even/non-labored, coarse rhonchi bilaterally R>L Abdomen:  NT, mild distention, bsx4 active  Musculoskeletal:  No acute distress  Skin: warm / dry, no edema   LABS:  CBC  Recent Labs Lab 01/19/14 0520 01/20/14 0330 01/21/14 0600  WBC 13.6* 10.7* 11.3*  HGB 8.5* 7.7* 7.8*  HCT 25.9* 24.1* 24.3*  PLT 176 274 456*   BMET  Recent Labs Lab 01/19/14 0520 01/20/14 0330 01/21/14 0600  NA 140 141 141  K 3.4* 3.4* 3.6  CL 103 105 106  CO2 29 32 33*  BUN 13 13 11   CREATININE 0.57 0.44* 0.43*  GLUCOSE 102* 99 106*   Electrolytes  Recent Labs Lab 01/18/14 0300  01/19/14 0520 01/20/14 0330 01/21/14 0600  CALCIUM 6.3*  < > 6.6* 6.9* 7.1*  MG 1.9  --   --  1.5 1.9  PHOS  --   --   --  1.6* 2.8  < > = values in this interval not displayed. Sepsis Markers  Recent Labs Lab 01/15/14 1614 01/15/14 1807 01/16/14 0830 01/17/14 0540 01/18/14 0300  LATICACIDVEN 7.06* 5.17* 1.4  --   --   PROCALCITON  --   --  25.80 18.52 11.55   ABG  Recent Labs Lab 01/15/14 1930 01/16/14  0826 01/16/14 1020  PHART 7.429 7.489* 7.382  PCO2ART 35.3 33.5* 41.4  PO2ART 79.2* 58.8* 83.0   Liver Enzymes  Recent Labs Lab 01/15/14 1602 01/16/14 0348 01/17/14 0540  AST 50* 87* 44*  ALT 24 29 20   ALKPHOS 114 91 93  BILITOT 0.7 0.5 0.5  ALBUMIN 2.3* 2.0* 1.7*   Glucose  Recent Labs Lab 01/20/14 0804 01/20/14 1244 01/20/14 1559 01/20/14 2021 01/20/14 2356 01/21/14 0407  GLUCAP 108* 88 76 100* 92 89    Imaging Portable Chest Xray  01/20/2014   CLINICAL DATA:  Acute respiratory failure, intubated  EXAM: PORTABLE CHEST - 1 VIEW  COMPARISON:  01/21/2012  FINDINGS: Endotracheal tube 6.4 cm above the carina. NG tube enters the stomach. Left IJ central line tip at the SVC RA junction as before. Stable right pleural effusion and bibasilar airspace  process/consolidation. No pneumothorax. No significant interval change.  IMPRESSION: Stable right effusion with bibasilar airspace disease/consolidation   Electronically Signed   By: Daryll Brod M.D.   On: 01/20/2014 11:30   Dg Chest Port 1 View  01/20/2014   CLINICAL DATA:  Acute respiratory failure  EXAM: PORTABLE CHEST - 1 VIEW  COMPARISON:  Yesterday  FINDINGS: Tubular device is stable. Right pleural effusion and basilar opacities stable. Vascular congestion with suspected interstitial edema is worse. No pneumothorax.  IMPRESSION: Stable right effusion and basilar opacity.  Vascular congestion and suspected interstitial edema are worsening.   Electronically Signed   By: Maryclare Bean M.D.   On: 01/20/2014 07:40   Dg Abd Portable 1v  01/20/2014   CLINICAL DATA:  OG tube placement.  EXAM: PORTABLE ABDOMEN - 1 VIEW  COMPARISON:  01/17/2014 and chest x-ray 01/20/2014  FINDINGS: There is evidence of patient's central venous catheter with tip in the region of the cavoatrial junction. Enteric tube is present which is looped over the stomach and has tip over the mid abdomen likely in the distal stomach. Bowel gas pattern is nonobstructive with air throughout the colon. Small caliber catheter projected over the rectum. There is evidence patient's known moderate size right pleural effusion. Remainder of the exam is unchanged.  IMPRESSION: Nonobstructive bowel gas pattern. Enteric tube looped over the stomach once with tip over the midline abdomen likely in the distal stomach.  Known moderate size right pleural effusion.   Electronically Signed   By: Marin Olp M.D.   On: 01/20/2014 11:42     ASSESSMENT / PLAN:  PULMONARY OETT 12/22 >> 12/26>> A: Acute Hypoxic Respiratory Failure - in setting of RLL PNA.  Minimal O2 requirements. RLL CAP - with positive strep antigen R Pleural Effusion - R pleural space assessed with [place] 12/23, not enough fluid for thora.   Tobacco Abuse P:   SBTs but hold off  extubation until secretions decrease Xopenex/Atrovent Q6 + Q3 PRN xopenex  Monitor R pleural effusion - will ask IR to assess since ongoing fever mucomyst   CARDIOVASCULAR CVL L IJ TLC 12/22 >>  A:  Severe Sepsis - in setting of PNA.  Required pressors 12/23, off 12/24 Tachycardia  P:   lasix 40 x1  RENAL A:   Hypokalemia  Hypomagnesemia  Hypocalcemia P:   Trend BMP Replace electrolytes as indicated FU ion Ca 12/27  GASTROINTESTINAL A:   Protein Calorie Malnutrition - albumin 2.0 Ileus - no evidence for bowel obstruction 12/23 -improved with reglan P:   TF held PPI    HEMATOLOGIC A:   Macrocytic Anemia - in setting of  ETOH abuse P:  Thiamine / Folate / MVI Trend CBC Heparin for DVT prophylaxis   INFECTIOUS A:   RLL PNA - strep antigen positive  R/O infected pleural space  Fever - tmax 105 12/23 P:   BCx2 12/21 >>strep pneumo sens UC  12/21 >> ng Sputum 12/21 >> moderate strep pneumoniae  Abx: Rocephin, start date 12/21 Abx: Azithro, 12/21>>12/25   tylenol given high temps / seizures, Q6 PRN    ENDOCRINE A:   No acute issues  P:   Monitor glucose on BMP  NEUROLOGIC A:   ETOH Abuse - at risk withdrawal Seizures - x2 overnight 12/23, fever up to 105 +/- ETOH withdrawal Anxiety / Depression  Concern for wernicke's P:   RASS goal: -1 Added Po ativan Fentanyl gtt for pain  precedex gtt Neurology consult, appreciate input D/c wellbutrin, zoloft  (lower threshold) Keppra loaded per Neuro - no maintenance since EEG neg High dose thiamine per neuro   FAMILY  - Updates: wife 12/27   Summary - CAP, ETOH withdrawal, septic shock resolved,failed extubation for secretions, persistent fever - evaluate rt effusion  The patient is critically ill with multiple organ systems failure and requires high complexity decision making for assessment and support, frequent evaluation and titration of therapies, application of advanced monitoring technologies  and extensive interpretation of multiple databases. Critical Care Time devoted to patient care services described in this note independent of APP time is 35 minutes.   Kara Mead MD. Shade Flood. [facility] Pager [phone] If no response call [phone]     01/21/2014, 10:17 AM

## 2014-01-22 ENCOUNTER — Inpatient Hospital Stay (HOSPITAL_COMMUNITY): Payer: Medicaid Other

## 2014-01-22 LAB — GLUCOSE, CAPILLARY
GLUCOSE-CAPILLARY: 107 mg/dL — AB (ref 70–99)
Glucose-Capillary: 101 mg/dL — ABNORMAL HIGH (ref 70–99)
Glucose-Capillary: 117 mg/dL — ABNORMAL HIGH (ref 70–99)
Glucose-Capillary: 101 mg/dL — ABNORMAL HIGH (ref 70–99)
Glucose-Capillary: 117 mg/dL — ABNORMAL HIGH (ref 70–99)
Glucose-Capillary: 103 mg/dL — ABNORMAL HIGH (ref 70–99)

## 2014-01-22 LAB — BASIC METABOLIC PANEL
ANION GAP: 2 — AB (ref 5–15)
BUN: 18 mg/dL (ref 6–23)
CALCIUM: 7.3 mg/dL — AB (ref 8.4–10.5)
CO2: 34 mmol/L — AB (ref 19–32)
Chloride: 105 mEq/L (ref 96–112)
Creatinine, Ser: 0.53 mg/dL (ref 0.50–1.35)
GFR calc Af Amer: >90 mL/min (ref >90)
Glucose, Bld: 106 mg/dL — ABNORMAL HIGH (ref 70–99)
Potassium: 4.1 mmol/L (ref 3.5–5.1)
Sodium: 141 mmol/L (ref 135–145)

## 2014-01-22 LAB — CBC
HCT: 25.3 % — ABNORMAL LOW (ref 39.0–52.0)
Hemoglobin: 8.1 g/dL — ABNORMAL LOW (ref 13.0–17.0)
MCH: 35.4 pg — ABNORMAL HIGH (ref 26.0–34.0)
MCHC: 32 g/dL (ref 30.0–36.0)
MCV: 110.5 fL — ABNORMAL HIGH (ref 78.0–100.0)
Platelets: 686 10*3/uL — ABNORMAL HIGH (ref 150–400)
RBC: 2.29 MIL/uL — AB (ref 4.22–5.81)
RDW: 13.2 % (ref 11.5–15.5)
WBC: 12.9 10*3/uL — ABNORMAL HIGH (ref 4.0–10.5)

## 2014-01-22 MED ORDER — ALTEPLASE 2 MG IJ SOLR
2.0000 mg | Freq: Once | INTRAMUSCULAR | Status: AC
  Administered 2014-01-22: 2 mg
  Filled 2014-01-22: qty 2

## 2014-01-22 MED ORDER — LORAZEPAM 2 MG/ML PO CONC
2.0000 mg | Freq: Three times a day (TID) | ORAL | Status: DC
  Administered 2014-01-22 – 2014-01-23 (×5): 2 mg
  Filled 2014-01-22 (×5): qty 1

## 2014-01-22 MED ORDER — FUROSEMIDE 10 MG/ML IJ SOLN
40.0000 mg | Freq: Four times a day (QID) | INTRAMUSCULAR | Status: AC
  Administered 2014-01-22 (×3): 40 mg via INTRAVENOUS
  Filled 2014-01-22 (×3): qty 4

## 2014-01-22 MED ORDER — POTASSIUM CHLORIDE CRYS ER 20 MEQ PO TBCR
40.0000 meq | EXTENDED_RELEASE_TABLET | Freq: Three times a day (TID) | ORAL | Status: AC
  Administered 2014-01-22 (×2): 40 meq via ORAL
  Filled 2014-01-22 (×2): qty 2

## 2014-01-22 NOTE — Progress Notes (Addendum)
PULMONARY / CRITICAL CARE MEDICINE   Name: Allen Wood MRN: [id number] DOB: 11/20/1970    ADMISSION DATE:  01/15/2014 CONSULTATION DATE:  01/16/14  REFERRING MD :  Dr. Grandville Silos   CHIEF COMPLAINT:  Acute Respiratory Failure   INITIAL PRESENTATION: 43 y/o M, smoker / ETOH abuse, admitted 12/21 with ongoing fatigue, cough with productive yellow sputum, R posterior back pain, chills & sweats.  Work up found patient to have RLL PNA with positive strep antigen.  Developed worsening respiratory failure 12/22  STUDIES:  12/24 EEG >> neg  SIGNIFICANT EVENTS: 12/21  Admit with RLL PNA 12/22  Decompensated, required intubation for respiratory failure in setting of PNA 12/23  Stable on vent, increased pleural effusion on CXR.  Levo @ 2 mcg, Fent 157mcg, Precedex 0.6.  Seizures overnight, Tmax 105 12/24   Concern for ileus with TF's coming out of mouth overnight.  Reglan dosing 12/26 reintubated due to inability to clear secretions  SUBJECTIVE: Febrile Awake & calmer this am No BM  VITAL SIGNS: Temp:  [100.2 F (37.9 C)-102.2 F (39 C)] 100.8 F (38.2 C) (12/28 0700) Pulse Rate:  [85-104] 89 (12/28 0821) Resp:  [13-24] 23 (12/28 0821) BP: (93-109)/(54-70) 106/67 mmHg (12/28 0821) SpO2:  [92 %-97 %] 95 % (12/28 0821) FiO2 (%):  [40 %] 40 % (12/28 0821)   HEMODYNAMICS: CVP:  [9 mmHg-14 mmHg] 12 mmHg   VENTILATOR SETTINGS: Vent Mode:  [-] PRVC FiO2 (%):  [40 %] 40 % Set Rate:  [14 bmp] 14 bmp Vt Set:  [600 mL] 600 mL PEEP:  [5 cmH20] 5 cmH20 Pressure Support:  [5 cmH20] 5 cmH20 Plateau Pressure:  [18 cmH20-196 cmH20] 18 cmH20   INTAKE / OUTPUT:  Intake/Output Summary (Last 24 hours) at 01/22/14 0857 Last data filed at 01/22/14 0700  Gross per 24 hour  Intake 1446.73 ml  Output   2155 ml  Net -708.27 ml    PHYSICAL EXAMINATION: General:  Thin young adult male in mild distress on vent Neuro: Int agitation on vent,RASS0, follows commands HEENT:  Dry MM, poor dentition   Cardiovascular:  s1s2 rrr,  no m/r/g  Lungs:  resp's even/non-labored, coarse rhonchi bilaterally R>L Abdomen:  NT, mild distention, bsx4 active  Musculoskeletal:  No acute distress  Skin: warm / dry, no edema   LABS:  CBC  Recent Labs Lab 01/20/14 0330 01/21/14 0600 01/22/14 0650  WBC 10.7* 11.3* 12.9*  HGB 7.7* 7.8* 8.1*  HCT 24.1* 24.3* 25.3*  PLT 274 456* 686*   BMET  Recent Labs Lab 01/20/14 0330 01/21/14 0600 01/22/14 0650  NA 141 141 141  K 3.4* 3.6 4.1  CL 105 106 105  CO2 32 33* 34*  BUN 13 11 18   CREATININE 0.44* 0.43* 0.53  GLUCOSE 99 106* 106*   Electrolytes  Recent Labs Lab 01/18/14 0300  01/20/14 0330 01/21/14 0600 01/22/14 0650  CALCIUM 6.3*  < > 6.9* 7.1* 7.3*  MG 1.9  --  1.5 1.9  --   PHOS  --   --  1.6* 2.8  --   < > = values in this interval not displayed. Sepsis Markers  Recent Labs Lab 01/15/14 1614 01/15/14 1807 01/16/14 0830 01/17/14 0540 01/18/14 0300  LATICACIDVEN 7.06* 5.17* 1.4  --   --   PROCALCITON  --   --  25.80 18.52 11.55   ABG  Recent Labs Lab 01/15/14 1930 01/16/14 0826 01/16/14 1020  PHART 7.429 7.489* 7.382  PCO2ART 35.3 33.5* 41.4  PO2ART  79.2* 58.8* 83.0   Liver Enzymes  Recent Labs Lab 01/15/14 1602 01/16/14 0348 01/17/14 0540  AST 50* 87* 44*  ALT 24 29 20   ALKPHOS 114 91 93  BILITOT 0.7 0.5 0.5  ALBUMIN 2.3* 2.0* 1.7*   Glucose  Recent Labs Lab 01/21/14 1208 01/21/14 1550 01/21/14 2035 01/22/14 0034 01/22/14 0455 01/22/14 0740  GLUCAP 121* 111* 109* 101* 117* 101*    Imaging Dg Chest Port 1 View  01/21/2014   CLINICAL DATA:  Acute respiratory failure, smoker  EXAM: PORTABLE CHEST - 1 VIEW  COMPARISON:  Portable exam 0602 hr compared to 01/20/2014  FINDINGS: Tip of endotracheal tube projects 6.1 cm above carina.  Nasogastric tube extends into stomach.  LEFT jugular central venous catheter tip projects over SVC near cavoatrial junction.  Normal heart size, mediastinal contours  and pulmonary vascularity.  Persistent RIGHT pleural effusion with atelectasis versus consolidation in both lower lobes.  No pneumothorax.  IMPRESSION: Persistent RIGHT pleural effusion with atelectasis versus consolidation in both lower lobes.   Electronically Signed   By: Lavonia Dana M.D.   On: 01/21/2014 08:44     ASSESSMENT / PLAN:  PULMONARY OETT 12/22 >> 12/26>>12/26>>> A: Acute Hypoxic Respiratory Failure - in setting of RLL PNA.  Minimal O2 requirements. RLL CAP - with positive strep antigen R Pleural Effusion - R pleural space assessed with [place] 12/23, not enough fluid for thora.   Tobacco Abuse P:   - Begin PS trials but no extubation given secretions and amount of sedation needed. - Xopenex/Atrovent Q6 + Q3 PRN xopenex  - Monitor R pleural effusion - will U/S later today - Mucomyst as ordered to assist with secretion control - Diureses as ordered.  CARDIOVASCULAR CVL L IJ TLC 12/22 >>  A:  Severe Sepsis - in setting of PNA.  Required pressors 12/23, off 12/24 Tachycardia  P:  - Diureses as ordered. - Monitor.  RENAL A:   Hypokalemia  Hypomagnesemia  Hypocalcemia P:   - Trend BMP - Replace electrolytes as indicated - Diureses 40 mg IV q6 x3 doses.  GASTROINTESTINAL A:   Protein Calorie Malnutrition - albumin 2.0 Ileus - no evidence for bowel obstruction 12/23 -improved with reglan P:   - TF as ordered. - Colace for constipation. - PPI.  HEMATOLOGIC A:   Macrocytic Anemia - in setting of ETOH abuse P:  - Thiamine / Folate / MVI - Trend CBC - Heparin for DVT prophylaxis   INFECTIOUS A:   RLL PNA - strep antigen positive  R/O infected pleural space  Fever - tmax 105 12/23 P:   - BCx2 12/21 >>strep pneumo sens - UC  12/21 >> ng - Sputum 12/21 >> moderate strep pneumoniae  - Abx: Rocephin, start date 12/21 - Abx: Azithro, 12/21>>12/25  - Tylenol given high temps / seizures, Q6 PRN   ENDOCRINE A:   No acute issues  P:   - Monitor glucose on  BMP  NEUROLOGIC A:   ETOH Abuse - at risk withdrawal Seizures - x2 overnight 12/23, fever up to 105 +/- ETOH withdrawal Anxiety / Depression  Concern for wernicke's per neuro. P:   - RASS goal: -1 - Continue PO ativan increase to 2 mg PO TID. - Fentanyl gtt for pain. - Precedex gtt currently at 1.2 - Neurology consult, appreciate input. - D/c wellbutrin, zoloft  (lower threshold) - Keppra loaded per Neuro - no maintenance since EEG neg - High dose thiamine per neuro now down to 1  gm aday.  FAMILY  - Updates: wife 12/28, all questions answered.  Summary - CAP, ETOH withdrawal, septic shock resolved,failed extubation for secretions, persistent fever - evaluate rt effusion.  Will come back tomorrow and evaluate pleural effusion with U/S when patient is more stable from a sedation standpoint.  Hold IR to evaluate pleural effusion.  Lasix ordered.  The patient is critically ill with multiple organ systems failure and requires high complexity decision making for assessment and support, frequent evaluation and titration of therapies, application of advanced monitoring technologies and extensive interpretation of multiple databases. Critical Care Time devoted to patient care services described in this note independent of APP time is 35 minutes.   Rush Farmer, M.D. [facility] Pulmonary/Critical Care Medicine. Pager: [phone]. After hours pager: [phone].  01/22/2014, 8:57 AM

## 2014-01-22 NOTE — Progress Notes (Signed)
Subjective: Patient remains intubated and sedated.  Agitated at times.  No further seizure activity noted clinically.    Objective: Current vital signs: BP 103/60 mmHg  Pulse 84  Temp(Src) 98.8 F (37.1 C) (Core (Comment))  Resp 14  Ht 5\' 11"  (1.803 m)  Wt 76 kg (167 lb 8.8 oz)  BMI 23.38 kg/m2  SpO2 94% Vital signs in last 24 hours: Temp:  [98.8 F (37.1 C)-101.7 F (38.7 C)] 98.8 F (37.1 C) (12/28 1600) Pulse Rate:  [81-97] 84 (12/28 1600) Resp:  [13-23] 14 (12/28 1600) BP: (93-113)/(54-82) 103/60 mmHg (12/28 1600) SpO2:  [94 %-97 %] 94 % (12/28 1600) FiO2 (%):  [40 %] 40 % (12/28 1600)  Intake/Output from previous day: 12/27 0701 - 12/28 0700 In: 1578.8 [I.V.:818.8; NG/GT:660; IV Piggyback:100] Out: 2250 [Urine:2250] Intake/Output this shift: Total I/O In: 941.2 [I.V.:481.2; NG/GT:360; IV Piggyback:100] Out: 425 [Urine:425] Nutritional status: Diet NPO time specified  Neurologic Exam: Mental Status: Intubated and sedated.  Agitated with minimal stimulation.  Does not follow commands.   Cranial Nerves: II: patient does not respond confrontation bilaterally, pupils right 2 mm, left 2 mm,and reactive bilaterally III,IV,VI: doll's response present bilaterally.  V,VII: corneal reflex reduced bilaterally  VIII: patient does not respond to verbal stimuli IX,X: gag reflex not tested, XI: trapezius strength unable to test bilaterally XII: tongue strength unable to test Motor: Moves all extremities spontaneously with no significant focality noted.   Sensory: Responds to light stimulation in all extremities.   Plantars: Mute bilaterally Cerebellar: Unable to perform    Lab Results: Basic Metabolic Panel:  Recent Labs Lab 01/16/14 0830 01/17/14 0540 01/18/14 0300 01/18/14 1055 01/19/14 0520 01/20/14 0330 01/21/14 0600 01/22/14 0650  NA  --  136 140 140 140 141 141 141  K  --  3.3* 4.4 4.2 3.4* 3.4* 3.6 4.1  CL  --  103 112 109 103 105 106 105  CO2  --   26 25 26 29  32 33* 34*  GLUCOSE  --  100* 115* 100* 102* 99 106* 106*  BUN  --  18 19 17 13 13 11 18   CREATININE  --  0.63 0.68 0.64 0.57 0.44* 0.43* 0.53  CALCIUM  --  6.2* 6.3* 6.5* 6.6* 6.9* 7.1* 7.3*  MG 1.1* 1.8 1.9  --   --  1.5 1.9  --   PHOS  --   --   --   --   --  1.6* 2.8  --     Liver Function Tests:  Recent Labs Lab 01/16/14 0348 01/17/14 0540  AST 87* 44*  ALT 29 20  ALKPHOS 91 93  BILITOT 0.5 0.5  PROT 4.9* 4.8*  ALBUMIN 2.0* 1.7*   No results for input(s): LIPASE, AMYLASE in the last 168 hours. No results for input(s): AMMONIA in the last 168 hours.  CBC:  Recent Labs Lab 01/15/14 2136 01/16/14 0830 01/17/14 0540 01/18/14 0300 01/19/14 0520 01/20/14 0330 01/21/14 0600 01/22/14 0650  WBC 8.4 12.4* 10.0 15.0* 13.6* 10.7* 11.3* 12.9*  NEUTROABS 7.9* 11.8* 9.1*  --   --   --   --   --   HGB 10.7* 10.0* 9.9* 9.0* 8.5* 7.7* 7.8* 8.1*  HCT 30.2* 28.7* 29.2* 27.6* 25.9* 24.1* 24.3* 25.3*  MCV 101.3* 102.5* 106.6* 109.5* 109.7* 110.6* 110.5* 110.5*  PLT 177 173 PLATELET CLUMPS NOTED ON SMEAR, COUNT APPEARS ADEQUATE 129* 176 274 456* 686*    Cardiac Enzymes: No results for input(s): CKTOTAL, CKMB, CKMBINDEX, TROPONINI  in the last 168 hours.  Lipid Panel: No results for input(s): CHOL, TRIG, HDL, CHOLHDL, VLDL, LDLCALC in the last 168 hours.  CBG:  Recent Labs Lab 01/21/14 2035 01/22/14 0034 01/22/14 0455 01/22/14 0740 01/22/14 1222  GLUCAP 109* 101* 117* 101* 107*    Microbiology: Results for orders placed or performed during the hospital encounter of 01/15/14  MRSA PCR Screening     Status: None   Collection Time: 01/15/14  8:24 PM  Result Value Ref Range Status   MRSA by PCR NEGATIVE NEGATIVE Final    Comment:        The GeneXpert MRSA Assay (FDA approved for NASAL specimens only), is one component of a comprehensive MRSA colonization surveillance program. It is not intended to diagnose MRSA infection nor to guide or monitor  treatment for MRSA infections.   Culture, sputum-assessment     Status: None   Collection Time: 01/15/14  8:40 PM  Result Value Ref Range Status   Specimen Description SPUTUM  Final   Special Requests NONE  Final   Sputum evaluation   Final    THIS SPECIMEN IS ACCEPTABLE. RESPIRATORY CULTURE REPORT TO FOLLOW.   Report Status 01/15/2014 FINAL  Final  Culture, respiratory (NON-Expectorated)     Status: None   Collection Time: 01/15/14  8:40 PM  Result Value Ref Range Status   Specimen Description SPUTUM  Final   Special Requests NONE  Final   Gram Stain   Final    FEW WBC PRESENT,BOTH PMN AND MONONUCLEAR RARE SQUAMOUS EPITHELIAL CELLS PRESENT FEW GRAM POSITIVE COCCI IN PAIRS Performed at [facility]    Culture   Final    MODERATE STREPTOCOCCUS PNEUMONIAE Performed at [facility]    Report Status 01/19/2014 FINAL  Final   Organism ID, Bacteria STREPTOCOCCUS PNEUMONIAE  Final      Susceptibility   Streptococcus pneumoniae - MIC (ETEST)*    CEFTRIAXONE 0.016 SENSITIVE Sensitive     LEVOFLOXACIN 0.75 SENSITIVE Sensitive     PENICILLIN 0.023 SENSITIVE Sensitive     * MODERATE STREPTOCOCCUS PNEUMONIAE  Culture, respiratory (NON-Expectorated)     Status: None   Collection Time: 01/16/14 10:11 AM  Result Value Ref Range Status   Specimen Description TRACHEAL ASPIRATE  Final   Special Requests Normal  Final   Gram Stain   Final    MODERATE WBC PRESENT, PREDOMINANTLY PMN NO SQUAMOUS EPITHELIAL CELLS SEEN FEW GRAM NEGATIVE COCCOBACILLI Performed at [facility]    Culture   Final    MODERATE STREPTOCOCCUS PNEUMONIAE Performed at [facility]    Report Status 01/20/2014 FINAL  Final   Organism ID, Bacteria STREPTOCOCCUS PNEUMONIAE  Final      Susceptibility   Streptococcus pneumoniae - MIC (ETEST)*    CEFTRIAXONE 0.023 SENSITIVE Sensitive     LEVOFLOXACIN 1.0 SENSITIVE Sensitive     PENICILLIN 0.023 SENSITIVE Sensitive     * MODERATE  STREPTOCOCCUS PNEUMONIAE  Culture, Urine     Status: None   Collection Time: 01/16/14 10:48 PM  Result Value Ref Range Status   Specimen Description URINE, CATHETERIZED  Final   Special Requests NONE  Final   Culture  Setup Time   Final    01/17/2014 11:37 Performed at [place] Performed at [facility]   Final   Culture NO GROWTH Performed at [facility]   Final   Report Status 01/18/2014 FINAL  Final  Coagulation Studies: No results for input(s): LABPROT, INR in the last 72 hours.  Imaging: Dg Chest Port 1 View  01/22/2014   CLINICAL DATA:  Acute respiratory failure.  EXAM: PORTABLE CHEST - 1 VIEW  COMPARISON:  04/01/2013.  FINDINGS: Endotracheal tube, left IJ line, NG tube in stable position. Heart size normal. Persistent prominent diffuse right mid and lower lung infiltrate with moderate size right pleural effusion. Left lower lobe atelectasis and or infiltrate. No pneumothorax.  IMPRESSION: 1. Lines and tubes in stable  position. 2. Persistent right mid and lower lung infiltrate with moderate size right pleural effusion . No interim change. 3. Left lower lobe atelectasis and/or infiltrate.   Electronically Signed   By: Marcello Moores  Register   On: 01/22/2014 07:11   Dg Chest Port 1 View  01/21/2014   CLINICAL DATA:  Acute respiratory failure, smoker  EXAM: PORTABLE CHEST - 1 VIEW  COMPARISON:  Portable exam 0602 hr compared to 01/20/2014  FINDINGS: Tip of endotracheal tube projects 6.1 cm above carina.  Nasogastric tube extends into stomach.  LEFT jugular central venous catheter tip projects over SVC near cavoatrial junction.  Normal heart size, mediastinal contours and pulmonary vascularity.  Persistent RIGHT pleural effusion with atelectasis versus consolidation in both lower lobes.  No pneumothorax.  IMPRESSION: Persistent RIGHT pleural effusion with atelectasis versus consolidation in both lower lobes.   Electronically Signed    By: Lavonia Dana M.D.   On: 01/21/2014 08:44    Medications:  I have reviewed the patient's current medications. Scheduled: . antiseptic oral rinse  7 mL Mouth Rinse QID  . cefTRIAXone (ROCEPHIN)  IV  2 g Intravenous Q24H  . chlorhexidine  15 mL Mouth Rinse BID  . enoxaparin (LOVENOX) injection  30 mg Subcutaneous Daily  . folic acid  1 mg Oral Daily  . furosemide  40 mg Intravenous Q6H  . guaiFENesin  20 mL Per Tube 6 times per day  . ipratropium  0.5 mg Nebulization Q6H  . levalbuterol  0.63 mg Nebulization Q6H  . LORazepam  2 mg Per Tube TID  . multivitamin  5 mL Per Tube Daily  . nicotine  14 mg Transdermal Daily  . pantoprazole sodium  40 mg Per Tube Q1200  . thiamine  100 mg Oral Daily   Or  . thiamine  100 mg Intravenous Daily    Assessment/Plan: Patient without further seizures.  Seizures felt to be alcohol withdrawal seizures.  No anticonvulsants initiated.    Recommendations: 1.  Will continue to follow with you.      LOS: 7 days   Alexis Goodell, MD [facility] [phone] 01/22/2014  4:19 PM

## 2014-01-23 ENCOUNTER — Inpatient Hospital Stay (HOSPITAL_COMMUNITY): Payer: Medicaid Other

## 2014-01-23 LAB — BASIC METABOLIC PANEL
ANION GAP: 4 — AB (ref 5–15)
BUN: 28 mg/dL — ABNORMAL HIGH (ref 6–23)
CHLORIDE: 106 meq/L (ref 96–112)
CO2: 31 mmol/L (ref 19–32)
CREATININE: 1.03 mg/dL (ref 0.50–1.35)
Calcium: 7.6 mg/dL — ABNORMAL LOW (ref 8.4–10.5)
GFR calc non Af Amer: 86 mL/min — ABNORMAL LOW (ref >90)
Glucose, Bld: 111 mg/dL — ABNORMAL HIGH (ref 70–99)
Potassium: 4.4 mmol/L (ref 3.5–5.1)
SODIUM: 141 mmol/L (ref 135–145)

## 2014-01-23 LAB — BLOOD GAS, ARTERIAL
Acid-Base Excess: 5.9 mmol/L — ABNORMAL HIGH (ref 0.0–2.0)
Bicarbonate: 29.7 meq/L — ABNORMAL HIGH (ref 20.0–24.0)
Drawn by: 308601
FIO2: 0.4 %
MECHVT: 600 mL
O2 Saturation: 91.6 %
PEEP: 5 cmH2O
Patient temperature: 38.2
RATE: 16 {breaths}/min
TCO2: 28 mmol/L (ref 0–100)
pCO2 arterial: 43.9 mmHg (ref 35.0–45.0)
pH, Arterial: 7.452 — ABNORMAL HIGH (ref 7.350–7.450)
pO2, Arterial: 66.5 mmHg — ABNORMAL LOW (ref 80.0–100.0)

## 2014-01-23 LAB — BODY FLUID CELL COUNT WITH DIFFERENTIAL
Eos, Fluid: 0 %
Lymphs, Fluid: 2 %
Monocyte-Macrophage-Serous Fluid: 16 % — ABNORMAL LOW (ref 50–90)
NEUTROPHIL FLUID: 82 % — AB (ref 0–25)
Total Nucleated Cell Count, Fluid: 1735 cu mm — ABNORMAL HIGH (ref 0–1000)

## 2014-01-23 LAB — GLUCOSE, CAPILLARY
GLUCOSE-CAPILLARY: 103 mg/dL — AB (ref 70–99)
GLUCOSE-CAPILLARY: 115 mg/dL — AB (ref 70–99)
GLUCOSE-CAPILLARY: 90 mg/dL (ref 70–99)
Glucose-Capillary: 114 mg/dL — ABNORMAL HIGH (ref 70–99)
Glucose-Capillary: 106 mg/dL — ABNORMAL HIGH (ref 70–99)

## 2014-01-23 LAB — CBC
HCT: 25.5 % — ABNORMAL LOW (ref 39.0–52.0)
Hemoglobin: 8.1 g/dL — ABNORMAL LOW (ref 13.0–17.0)
MCH: 34.9 pg — ABNORMAL HIGH (ref 26.0–34.0)
MCHC: 31.8 g/dL (ref 30.0–36.0)
MCV: 109.9 fL — AB (ref 78.0–100.0)
PLATELETS: 774 10*3/uL — AB (ref 150–400)
RBC: 2.32 MIL/uL — ABNORMAL LOW (ref 4.22–5.81)
RDW: 13.1 % (ref 11.5–15.5)
WBC: 16.3 10*3/uL — ABNORMAL HIGH (ref 4.0–10.5)

## 2014-01-23 LAB — CHOLESTEROL, TOTAL: CHOLESTEROL: 111 mg/dL (ref 0–200)

## 2014-01-23 LAB — MAGNESIUM: Magnesium: 1.6 mg/dL (ref 1.5–2.5)

## 2014-01-23 LAB — PROTEIN, BODY FLUID

## 2014-01-23 LAB — LACTATE DEHYDROGENASE, PLEURAL OR PERITONEAL FLUID: LD, Fluid: 196 U/L — ABNORMAL HIGH (ref 3–23)

## 2014-01-23 LAB — LACTATE DEHYDROGENASE: LDH: 182 U/L (ref 94–250)

## 2014-01-23 LAB — PROTEIN, TOTAL: Total Protein: 4.6 g/dL — ABNORMAL LOW (ref 6.0–8.3)

## 2014-01-23 LAB — PHOSPHORUS: Phosphorus: 4 mg/dL (ref 2.3–4.6)

## 2014-01-23 MED ORDER — FUROSEMIDE 10 MG/ML IJ SOLN
40.0000 mg | Freq: Four times a day (QID) | INTRAMUSCULAR | Status: AC
  Administered 2014-01-23 – 2014-01-24 (×3): 40 mg via INTRAVENOUS
  Filled 2014-01-23 (×3): qty 4

## 2014-01-23 MED ORDER — ENOXAPARIN SODIUM 30 MG/0.3ML ~~LOC~~ SOLN
30.0000 mg | SUBCUTANEOUS | Status: DC
  Administered 2014-01-23 – 2014-01-28 (×6): 30 mg via SUBCUTANEOUS
  Filled 2014-01-23 (×6): qty 0.3

## 2014-01-23 MED ORDER — MAGNESIUM SULFATE 2 GM/50ML IV SOLN
2.0000 g | Freq: Once | INTRAVENOUS | Status: AC
  Filled 2014-01-23: qty 50

## 2014-01-23 MED ORDER — MAGNESIUM SULFATE 2 GM/50ML IV SOLN
2.0000 g | Freq: Once | INTRAVENOUS | Status: AC
  Administered 2014-01-23: 2 g via INTRAVENOUS
  Filled 2014-01-23: qty 50

## 2014-01-23 NOTE — Progress Notes (Signed)
PULMONARY / CRITICAL CARE MEDICINE   Name: Allen Wood MRN: [id number] DOB: 09/18/1970    ADMISSION DATE:  01/15/2014 CONSULTATION DATE:  01/16/14  REFERRING MD :  Dr. Grandville Silos   CHIEF COMPLAINT:  Acute Respiratory Failure   INITIAL PRESENTATION: 43 y/o M, smoker / ETOH abuse, admitted 12/21 with ongoing fatigue, cough with productive yellow sputum, R posterior back pain, chills & sweats.  Work up found patient to have RLL PNA with positive strep antigen.  Developed worsening respiratory failure 12/22  STUDIES:  12/24 EEG >> neg  SIGNIFICANT EVENTS: 12/21  Admit with RLL PNA 12/22  Decompensated, required intubation for respiratory failure in setting of PNA 12/23  Stable on vent, increased pleural effusion on CXR.  Levo @ 2 mcg, Fent 130mcg, Precedex 0.6.  Seizures overnight, Tmax 105 12/24  Concern for ileus with TF's coming out of mouth overnight.  Reglan dosing 12/26  Reintubated due to inability to clear secretions  SUBJECTIVE: Febrile Sedation demand decreasing with increase in ativan PO yesterday, precedex remains high however.  Secretion decreasing.  VITAL SIGNS: Temp:  [98.8 F (37.1 C)-101.5 F (38.6 C)] 100.2 F (37.9 C) (12/29 0800) Pulse Rate:  [79-96] 86 (12/29 0800) Resp:  [13-20] 16 (12/29 0800) BP: (95-117)/(57-82) 107/64 mmHg (12/29 0800) SpO2:  [91 %-100 %] 93 % (12/29 0800) FiO2 (%):  [40 %] 40 % (12/29 0800) Weight:  [77.2 kg (170 lb 3.1 oz)] 77.2 kg (170 lb 3.1 oz) (12/29 0500)   HEMODYNAMICS: CVP:  [9 mmHg] 9 mmHg   VENTILATOR SETTINGS: Vent Mode:  [-] PRVC FiO2 (%):  [40 %] 40 % Set Rate:  [14 bmp-16 bmp] 16 bmp Vt Set:  [600 mL] 600 mL PEEP:  [5 cmH20] 5 cmH20 Plateau Pressure:  [18 cmH20-19 cmH20] 19 cmH20   INTAKE / OUTPUT:  Intake/Output Summary (Last 24 hours) at 01/23/14 0626 Last data filed at 01/23/14 0800  Gross per 24 hour  Intake 2622.2 ml  Output    940 ml  Net 1682.2 ml   PHYSICAL EXAMINATION: General:  Thin young  adult male comfortable on vent Neuro: Int agitation on vent, RASS 0, follows commands when aroused HEENT:  Dry MM, poor dentition  Cardiovascular:  s1s2 rrr,  no m/r/g  Lungs:  resp's even/non-labored, coarse rhonchi bilaterally R>L, dullness to percussion on right. Abdomen:  NT, mild distention, bsx4 active  Musculoskeletal:  No acute distress  Skin: warm / dry, no edema   LABS:  CBC  Recent Labs Lab 01/21/14 0600 01/22/14 0650 01/23/14 0330  WBC 11.3* 12.9* 16.3*  HGB 7.8* 8.1* 8.1*  HCT 24.3* 25.3* 25.5*  PLT 456* 686* 774*   BMET  Recent Labs Lab 01/21/14 0600 01/22/14 0650 01/23/14 0330  NA 141 141 141  K 3.6 4.1 4.4  CL 106 105 106  CO2 33* 34* 31  BUN 11 18 28*  CREATININE 0.43* 0.53 1.03  GLUCOSE 106* 106* 111*   Electrolytes  Recent Labs Lab 01/20/14 0330 01/21/14 0600 01/22/14 0650 01/23/14 0330  CALCIUM 6.9* 7.1* 7.3* 7.6*  MG 1.5 1.9  --  1.6  PHOS 1.6* 2.8  --  4.0   Sepsis Markers  Recent Labs Lab 01/16/14 0830 01/17/14 0540 01/18/14 0300  LATICACIDVEN 1.4  --   --   PROCALCITON 25.80 18.52 11.55   ABG  Recent Labs Lab 01/16/14 0826 01/16/14 1020 01/23/14 0438  PHART 7.489* 7.382 7.452*  PCO2ART 33.5* 41.4 43.9  PO2ART 58.8* 83.0 66.5*   Liver Enzymes  Recent Labs Lab 01/17/14 0540  AST 44*  ALT 20  ALKPHOS 93  BILITOT 0.5  ALBUMIN 1.7*   Glucose  Recent Labs Lab 01/22/14 0034 01/22/14 0455 01/22/14 0740 01/22/14 1222 01/22/14 1548 01/22/14 2010  GLUCAP 101* 117* 101* 107* 117* 103*    Imaging Dg Chest Port 1 View  01/22/2014   CLINICAL DATA:  Acute respiratory failure.  EXAM: PORTABLE CHEST - 1 VIEW  COMPARISON:  04/01/2013.  FINDINGS: Endotracheal tube, left IJ line, NG tube in stable position. Heart size normal. Persistent prominent diffuse right mid and lower lung infiltrate with moderate size right pleural effusion. Left lower lobe atelectasis and or infiltrate. No pneumothorax.  IMPRESSION: 1. Lines  and tubes in stable  position. 2. Persistent right mid and lower lung infiltrate with moderate size right pleural effusion . No interim change. 3. Left lower lobe atelectasis and/or infiltrate.   Electronically Signed   By: Marcello Moores  Register   On: 01/22/2014 07:11   ASSESSMENT / PLAN:  PULMONARY OETT 12/22 >> 12/26>>12/26>>> A: Acute Hypoxic Respiratory Failure - in setting of RLL PNA.  Minimal O2 requirements. RLL CAP - with positive strep antigen R Pleural Effusion - R pleural space assessed with [place] 12/23, not enough fluid for thora.   Tobacco Abuse P:   - Continue PS trials, hold extubation until adequately diuresed. - Monitor secretion control. - See neuro section. - Xopenex/Atrovent Q6 + Q3 PRN xopenex. - Monitor R pleural effusion - thora today given fever and organization of effusion on CXR, elevation in WBC. - Mucomyst as ordered to assist with secretion control - Diureses as ordered.  CARDIOVASCULAR CVL L IJ TLC 12/22 >>  A:  Severe Sepsis - in setting of PNA.  Required pressors 12/23, off 12/24 Tachycardia  P:  - Diureses as ordered. - Monitor. - Hopefully with decrease in sedation will allow BP to drift upwards, this will be the tradeoffs for today.  RENAL A:   Hypokalemia  Hypomagnesemia  Hypocalcemia P:   - Trend BMP - Replace electrolytes as indicated - Diureses 40 mg IV q6 x3 doses.  GASTROINTESTINAL A:   Protein Calorie Malnutrition - albumin 2.0 Ileus - no evidence for bowel obstruction 12/23 -improved with reglan P:   - TF as ordered. - Colace for constipation. - PPI.  HEMATOLOGIC A:   Macrocytic Anemia - in setting of ETOH abuse P:  - Thiamine / Folate / MVI - Trend CBC - Heparin for DVT prophylaxis   INFECTIOUS A:   RLL PNA - strep antigen positive  R/O infected pleural space  Fever - tmax 105 12/23 WBC increasing and febrile, ?empyema. P:   - BCx2 12/21 >>strep pneumo sens - UC  12/21 >> ng - Sputum 12/21 >> moderate strep  pneumoniae  - Abx: Rocephin, start date 12/21, continue for now given concerns above. - Abx: Azithro, 12/21>>12/25  - Tylenol given high temps / seizures, Q6 PRN  - Thora with fluid analysis today.  ENDOCRINE A:   No acute issues  P:   - Monitor glucose on BMP  NEUROLOGIC A:   ETOH Abuse - at risk withdrawal Seizures - x2 overnight 12/23, fever up to 105 +/- ETOH withdrawal Anxiety / Depression  Concern for wernicke's per neuro. P:   - RASS goal: -1 - Continue PO ativan increase to 2 mg PO TID. - Fentanyl gtt for pain. - Precedex gtt currently at 1.2 - Neurology consult, appreciate input. - D/c wellbutrin, zoloft  (lower threshold) -  D/C Keppra. - High dose thiamine per neuro now down to 1 gm aday.  FAMILY  - Updates: wife 12/28, all questions answered.  Summary - CAP, ETOH withdrawal, septic shock resolved, failed extubation for secretions 12/26, persistent fever - evaluate rt effusion.  Will thora today with the hope that it is not an empyema.  Lasix ordered.  Wean as able.  The patient is critically ill with multiple organ systems failure and requires high complexity decision making for assessment and support, frequent evaluation and titration of therapies, application of advanced monitoring technologies and extensive interpretation of multiple databases. Critical Care Time devoted to patient care services described in this note independent of APP time is 35 minutes.   Rush Farmer, M.D. [facility] Pulmonary/Critical Care Medicine. Pager: [phone]. After hours pager: [phone].  01/23/2014, 8:24 AM

## 2014-01-23 NOTE — Progress Notes (Signed)
Subjective: No further seizures noted.  Remains intubated.  Has started to follow some commands per nursing.    Objective: Current vital signs: BP 109/60 mmHg  Pulse 88  Temp(Src) 100 F (37.8 C) (Core (Comment))  Resp 16  Ht 5\' 11"  (1.803 m)  Wt 77.2 kg (170 lb 3.1 oz)  BMI 23.75 kg/m2  SpO2 92% Vital signs in last 24 hours: Temp:  [98.8 F (37.1 C)-101.5 F (38.6 C)] 100 F (37.8 C) (12/29 1000) Pulse Rate:  [79-96] 88 (12/29 1000) Resp:  [13-19] 16 (12/29 1000) BP: (95-117)/(57-82) 109/60 mmHg (12/29 1000) SpO2:  [91 %-100 %] 92 % (12/29 1000) FiO2 (%):  [40 %] 40 % (12/29 1043) Weight:  [77.2 kg (170 lb 3.1 oz)] 77.2 kg (170 lb 3.1 oz) (12/29 0500)  Intake/Output from previous day: 12/28 0701 - 12/29 0700 In: 2652.4 [I.V.:1382.4; NG/GT:1120; IV Piggyback:150] Out: 940 [Urine:940] Intake/Output this shift: Total I/O In: 244 [I.V.:124; NG/GT:120] Out: -  Nutritional status: Diet NPO time specified  Neurologic Exam: Mental Status: Intubated and sedated.  Cranial Nerves: II: patient does not respond confrontation bilaterally, pupils right 2 mm, left 2 mm,and reactive bilaterally III,IV,VI: doll's response present bilaterally.  V,VII: corneal reflex reduced bilaterally  Motor: Moves all extremities spontaneously with no significant focality noted.  DTR's: Brisk throughout with bilateral ankle clonus Plantars: Mute bilaterally Cerebellar: Unable to perform  Lab Results: Basic Metabolic Panel:  Recent Labs Lab 01/17/14 0540 01/18/14 0300  01/19/14 0520 01/20/14 0330 01/21/14 0600 01/22/14 0650 01/23/14 0330  NA 136 140  < > 140 141 141 141 141  K 3.3* 4.4  < > 3.4* 3.4* 3.6 4.1 4.4  CL 103 112  < > 103 105 106 105 106  CO2 26 25  < > 29 32 33* 34* 31  GLUCOSE 100* 115*  < > 102* 99 106* 106* 111*  BUN 18 19  < > 13 13 11 18  28*  CREATININE 0.63 0.68  < > 0.57 0.44* 0.43* 0.53 1.03  CALCIUM 6.2* 6.3*  < > 6.6* 6.9* 7.1* 7.3* 7.6*  MG 1.8 1.9   --   --  1.5 1.9  --  1.6  PHOS  --   --   --   --  1.6* 2.8  --  4.0  < > = values in this interval not displayed.  Liver Function Tests:  Recent Labs Lab 01/17/14 0540  AST 44*  ALT 20  ALKPHOS 93  BILITOT 0.5  PROT 4.8*  ALBUMIN 1.7*   No results for input(s): LIPASE, AMYLASE in the last 168 hours. No results for input(s): AMMONIA in the last 168 hours.  CBC:  Recent Labs Lab 01/17/14 0540  01/19/14 0520 01/20/14 0330 01/21/14 0600 01/22/14 0650 01/23/14 0330  WBC 10.0  < > 13.6* 10.7* 11.3* 12.9* 16.3*  NEUTROABS 9.1*  --   --   --   --   --   --   HGB 9.9*  < > 8.5* 7.7* 7.8* 8.1* 8.1*  HCT 29.2*  < > 25.9* 24.1* 24.3* 25.3* 25.5*  MCV 106.6*  < > 109.7* 110.6* 110.5* 110.5* 109.9*  PLT PLATELET CLUMPS NOTED ON SMEAR, COUNT APPEARS ADEQUATE  < > 176 274 456* 686* 774*  < > = values in this interval not displayed.  Cardiac Enzymes: No results for input(s): CKTOTAL, CKMB, CKMBINDEX, TROPONINI in the last 168 hours.  Lipid Panel: No results for input(s): CHOL, TRIG, HDL, CHOLHDL, VLDL, LDLCALC in the last  168 hours.  CBG:  Recent Labs Lab 01/22/14 1548 01/22/14 2010 01/22/14 2346 01/23/14 0343 01/23/14 0742  GLUCAP 117* 103* 90 114* 115*    Microbiology: Results for orders placed or performed during the hospital encounter of 01/15/14  MRSA PCR Screening     Status: None   Collection Time: 01/15/14  8:24 PM  Result Value Ref Range Status   MRSA by PCR NEGATIVE NEGATIVE Final    Comment:        The GeneXpert MRSA Assay (FDA approved for NASAL specimens only), is one component of a comprehensive MRSA colonization surveillance program. It is not intended to diagnose MRSA infection nor to guide or monitor treatment for MRSA infections.   Culture, sputum-assessment     Status: None   Collection Time: 01/15/14  8:40 PM  Result Value Ref Range Status   Specimen Description SPUTUM  Final   Special Requests NONE  Final   Sputum evaluation   Final     THIS SPECIMEN IS ACCEPTABLE. RESPIRATORY CULTURE REPORT TO FOLLOW.   Report Status 01/15/2014 FINAL  Final  Culture, respiratory (NON-Expectorated)     Status: None   Collection Time: 01/15/14  8:40 PM  Result Value Ref Range Status   Specimen Description SPUTUM  Final   Special Requests NONE  Final   Gram Stain   Final    FEW WBC PRESENT,BOTH PMN AND MONONUCLEAR RARE SQUAMOUS EPITHELIAL CELLS PRESENT FEW GRAM POSITIVE COCCI IN PAIRS Performed at [facility]    Culture   Final    MODERATE STREPTOCOCCUS PNEUMONIAE Performed at [facility]    Report Status 01/19/2014 FINAL  Final   Organism ID, Bacteria STREPTOCOCCUS PNEUMONIAE  Final      Susceptibility   Streptococcus pneumoniae - MIC (ETEST)*    CEFTRIAXONE 0.016 SENSITIVE Sensitive     LEVOFLOXACIN 0.75 SENSITIVE Sensitive     PENICILLIN 0.023 SENSITIVE Sensitive     * MODERATE STREPTOCOCCUS PNEUMONIAE  Culture, respiratory (NON-Expectorated)     Status: None   Collection Time: 01/16/14 10:11 AM  Result Value Ref Range Status   Specimen Description TRACHEAL ASPIRATE  Final   Special Requests Normal  Final   Gram Stain   Final    MODERATE WBC PRESENT, PREDOMINANTLY PMN NO SQUAMOUS EPITHELIAL CELLS SEEN FEW GRAM NEGATIVE COCCOBACILLI Performed at [facility]    Culture   Final    MODERATE STREPTOCOCCUS PNEUMONIAE Performed at [facility]    Report Status 01/20/2014 FINAL  Final   Organism ID, Bacteria STREPTOCOCCUS PNEUMONIAE  Final      Susceptibility   Streptococcus pneumoniae - MIC (ETEST)*    CEFTRIAXONE 0.023 SENSITIVE Sensitive     LEVOFLOXACIN 1.0 SENSITIVE Sensitive     PENICILLIN 0.023 SENSITIVE Sensitive     * MODERATE STREPTOCOCCUS PNEUMONIAE  Culture, Urine     Status: None   Collection Time: 01/16/14 10:48 PM  Result Value Ref Range Status   Specimen Description URINE, CATHETERIZED  Final   Special Requests NONE  Final   Culture  Setup Time   Final     01/17/2014 11:37 Performed at [place] Performed at [facility]   Final   Culture NO GROWTH Performed at [facility]   Final   Report Status 01/18/2014 FINAL  Final    Coagulation Studies: No results for input(s): LABPROT, INR in the last 72 hours.  Imaging: Dg Chest [facility] 1 [address]  01/23/2014   CLINICAL DATA:  Hypoxia  EXAM: PORTABLE CHEST - 1 VIEW  COMPARISON:  January 22, 2014  FINDINGS: Endotracheal tube tip is 3.4 cm above the carina. Central catheter tip is in the superior vena cava. Nasogastric tube tip and side port are below the diaphragm. No pneumothorax. There are bilateral pleural effusions, larger on the right than on the left. Effusion on the right appears loculated. There is consolidation in the left lower lobe. There is perihilar interstitial edema on the left. Heart size and pulmonary vascularity are within normal limits. No adenopathy.  IMPRESSION: Tube and catheter positions as described without pneumothorax. Bilateral effusions, larger on the right than on the left. Right effusion is at least partially loculated. Perihilar interstitial edema is noted on the left, stable. There is left lower lobe consolidation, stable. No new opacity overall. No change in cardiac silhouette.   Electronically Signed   By: Lowella Grip M.D.   On: 01/23/2014 07:47   Dg Chest Port 1 View  01/22/2014   CLINICAL DATA:  Acute respiratory failure.  EXAM: PORTABLE CHEST - 1 VIEW  COMPARISON:  04/01/2013.  FINDINGS: Endotracheal tube, left IJ line, NG tube in stable position. Heart size normal. Persistent prominent diffuse right mid and lower lung infiltrate with moderate size right pleural effusion. Left lower lobe atelectasis and or infiltrate. No pneumothorax.  IMPRESSION: 1. Lines and tubes in stable  position. 2. Persistent right mid and lower lung infiltrate with moderate size right pleural effusion . No interim change. 3. Left lower lobe  atelectasis and/or infiltrate.   Electronically Signed   By: Marcello Moores  Register   On: 01/22/2014 07:11    Medications:  I have reviewed the patient's current medications. Scheduled: . antiseptic oral rinse  7 mL Mouth Rinse QID  . cefTRIAXone (ROCEPHIN)  IV  2 g Intravenous Q24H  . chlorhexidine  15 mL Mouth Rinse BID  . enoxaparin (LOVENOX) injection  30 mg Subcutaneous Daily  . folic acid  1 mg Oral Daily  . furosemide  40 mg Intravenous Q6H  . guaiFENesin  20 mL Per Tube 6 times per day  . ipratropium  0.5 mg Nebulization Q6H  . levalbuterol  0.63 mg Nebulization Q6H  . LORazepam  2 mg Per Tube TID  . multivitamin  5 mL Per Tube Daily  . nicotine  14 mg Transdermal Daily  . pantoprazole sodium  40 mg Per Tube Q1200  . thiamine  100 mg Oral Daily   Or  . thiamine  100 mg Intravenous Daily    Assessment/Plan: Mental status slowly imporoving.  No further seizures.  Unclear etiology of clonus.    Recommendations: 1.  Once extubated would perform MRI of the brain 2.  B12   LOS: 8 days   Alexis Goodell, MD [facility] [phone] 01/23/2014  10:48 AM

## 2014-01-23 NOTE — Progress Notes (Signed)
Called stat to patient's room for help with ventilator.Patient apparently became very agitated, trying to situp and ripped ventilator exhalation tube off the ventilator. RN bagged patient until I got the ventilator circuit reattached, changing the HME and suction catheter again because of a large amt. Thick tan,yellow bloody secretions. ETT remained in good position and patient given sedation. Will continue to monitor.

## 2014-01-23 NOTE — Procedures (Signed)
Thoracentesis Procedure Note  Pre-operative Diagnosis: Right sided pleural effusion  Post-operative Diagnosis: same  Indications: Right sided pleural effusion  Procedure Details  Consent: Informed consent was obtained. Risks of the procedure were discussed including: infection, bleeding, pain, pneumothorax.  Under sterile conditions the patient was positioned. Betadine solution and sterile drapes were utilized.  1% buffered lidocaine was used to anesthetize the 7 rib space. Fluid was obtained without any difficulties and minimal blood loss.  A dressing was applied to the wound and wound care instructions were provided.   Findings 1000 ml of clear pleural fluid was obtained. A sample was sent to Pathology for cytogenetics, flow, and cell counts, as well as for infection analysis.  Complications:  None; patient tolerated the procedure well.          Condition: stable  Plan A follow up chest x-ray was ordered. Bed Rest for 0 hours. Tylenol 650 mg. for pain.  Attending Attestation: I was present and scrubbed for the entire procedure.  U/S used in performing the procedure.  Fluid to lab for analysis.  Rush Farmer, M.D. [facility] Pulmonary/Critical Care Medicine. Pager: [phone]. After hours pager: [phone].

## 2014-01-23 NOTE — Progress Notes (Signed)
[facility] Progress Note Patient Name: Parthiv Mucci DOB: 1970-11-21 MRN: [id number]   Date of Service  01/23/2014  HPI/Events of Note    eICU Interventions  Magnesium replaced     Intervention Category Intermediate Interventions: Electrolyte abnormality - evaluation and management  [place] Greb S. 01/23/2014, 5:09 AM

## 2014-01-24 ENCOUNTER — Inpatient Hospital Stay (HOSPITAL_COMMUNITY): Payer: Medicaid Other

## 2014-01-24 LAB — BASIC METABOLIC PANEL
ANION GAP: 6 (ref 5–15)
BUN: 42 mg/dL — AB (ref 6–23)
CO2: 28 mmol/L (ref 19–32)
CREATININE: 1.47 mg/dL — AB (ref 0.50–1.35)
Calcium: 6.8 mg/dL — ABNORMAL LOW (ref 8.4–10.5)
Chloride: 104 mEq/L (ref 96–112)
GFR calc Af Amer: 65 mL/min — ABNORMAL LOW (ref >90)
GFR calc non Af Amer: 56 mL/min — ABNORMAL LOW (ref >90)
Glucose, Bld: 99 mg/dL (ref 70–99)
Potassium: 4.6 mmol/L (ref 3.5–5.1)
Sodium: 138 mmol/L (ref 135–145)

## 2014-01-24 LAB — GLUCOSE, CAPILLARY
GLUCOSE-CAPILLARY: 111 mg/dL — AB (ref 70–99)
GLUCOSE-CAPILLARY: 154 mg/dL — AB (ref 70–99)
GLUCOSE-CAPILLARY: 95 mg/dL (ref 70–99)
Glucose-Capillary: 89 mg/dL (ref 70–99)
Glucose-Capillary: 91 mg/dL (ref 70–99)
Glucose-Capillary: 109 mg/dL — ABNORMAL HIGH (ref 70–99)

## 2014-01-24 LAB — URINALYSIS, ROUTINE W REFLEX MICROSCOPIC
Glucose, UA: NEGATIVE mg/dL
Ketones, ur: NEGATIVE mg/dL
NITRITE: NEGATIVE
PH: 5 (ref 5.0–8.0)
Protein, ur: 30 mg/dL — AB
Specific Gravity, Urine: 1.025 (ref 1.005–1.030)
Urobilinogen, UA: 0.2 mg/dL (ref 0.0–1.0)

## 2014-01-24 LAB — BLOOD GAS, ARTERIAL
ACID-BASE EXCESS: 3.8 mmol/L — AB (ref 0.0–2.0)
Bicarbonate: 27 mEq/L — ABNORMAL HIGH (ref 20.0–24.0)
DRAWN BY: 308601
FIO2: 0.45 %
LHR: 16 {breaths}/min
MECHVT: 600 mL
O2 SAT: 91.8 %
PEEP: 5 cmH2O
PO2 ART: 63.6 mmHg — AB (ref 80.0–100.0)
Patient temperature: 37
TCO2: 25.4 mmol/L (ref 0–100)
pCO2 arterial: 36.4 mmHg (ref 35.0–45.0)
pH, Arterial: 7.483 — ABNORMAL HIGH (ref 7.350–7.450)

## 2014-01-24 LAB — CBC
HEMATOCRIT: 23.5 % — AB (ref 39.0–52.0)
Hemoglobin: 7.8 g/dL — ABNORMAL LOW (ref 13.0–17.0)
MCH: 35.8 pg — ABNORMAL HIGH (ref 26.0–34.0)
MCHC: 33.2 g/dL (ref 30.0–36.0)
MCV: 107.8 fL — AB (ref 78.0–100.0)
Platelets: 805 10*3/uL — ABNORMAL HIGH (ref 150–400)
RBC: 2.18 MIL/uL — AB (ref 4.22–5.81)
RDW: 13.2 % (ref 11.5–15.5)
WBC: 23.8 10*3/uL — ABNORMAL HIGH (ref 4.0–10.5)

## 2014-01-24 LAB — URINE MICROSCOPIC-ADD ON

## 2014-01-24 LAB — VITAMIN B12: Vitamin B-12: 981 pg/mL — ABNORMAL HIGH (ref 211–911)

## 2014-01-24 LAB — PH, BODY FLUID: pH, Fluid: 8

## 2014-01-24 LAB — PHOSPHORUS: Phosphorus: 5 mg/dL — ABNORMAL HIGH (ref 2.3–4.6)

## 2014-01-24 LAB — MAGNESIUM: Magnesium: 1.8 mg/dL (ref 1.5–2.5)

## 2014-01-24 MED ORDER — LIDOCAINE HCL (CARDIAC) 20 MG/ML IV SOLN
INTRAVENOUS | Status: AC
  Filled 2014-01-24: qty 5

## 2014-01-24 MED ORDER — NOREPINEPHRINE BITARTRATE 1 MG/ML IV SOLN
2.0000 ug/min | INTRAVENOUS | Status: DC
  Administered 2014-01-24: 5 ug/min via INTRAVENOUS
  Administered 2014-01-25: 8 ug/min via INTRAVENOUS
  Filled 2014-01-24 (×4): qty 4

## 2014-01-24 MED ORDER — VITAL AF 1.2 CAL PO LIQD
1000.0000 mL | ORAL | Status: DC
  Administered 2014-01-25 – 2014-01-26 (×2): 1000 mL
  Filled 2014-01-24 (×9): qty 1000

## 2014-01-24 MED ORDER — NOREPINEPHRINE BITARTRATE 1 MG/ML IV SOLN: 2.0000 ug/min | INTRAVENOUS | Status: DC

## 2014-01-24 MED ORDER — VANCOMYCIN HCL IN DEXTROSE 1-5 GM/200ML-% IV SOLN
1000.0000 mg | Freq: Two times a day (BID) | INTRAVENOUS | Status: DC
  Administered 2014-01-24 – 2014-01-25 (×3): 1000 mg via INTRAVENOUS
  Filled 2014-01-24 (×3): qty 200

## 2014-01-24 MED ORDER — PIPERACILLIN-TAZOBACTAM 3.375 G IVPB
3.3750 g | Freq: Three times a day (TID) | INTRAVENOUS | Status: DC
  Administered 2014-01-24 – 2014-01-26 (×8): 3.375 g via INTRAVENOUS
  Filled 2014-01-24 (×9): qty 50

## 2014-01-24 MED ORDER — SUCCINYLCHOLINE CHLORIDE 20 MG/ML IJ SOLN
INTRAMUSCULAR | Status: AC
  Filled 2014-01-24: qty 1

## 2014-01-24 MED ORDER — SODIUM CHLORIDE 0.9 % IV BOLUS (SEPSIS)
1000.0000 mL | Freq: Once | INTRAVENOUS | Status: AC
  Administered 2014-01-24: 1000 mL via INTRAVENOUS

## 2014-01-24 MED ORDER — ETOMIDATE 2 MG/ML IV SOLN
INTRAVENOUS | Status: AC
  Administered 2014-01-24: 20 mg
  Filled 2014-01-24: qty 20

## 2014-01-24 MED ORDER — ROCURONIUM BROMIDE 50 MG/5ML IV SOLN
INTRAVENOUS | Status: AC
  Filled 2014-01-24: qty 2

## 2014-01-24 NOTE — Progress Notes (Signed)
PULMONARY / CRITICAL CARE MEDICINE   Name: Rose Hippler MRN: [id number] DOB: Nov 15, 1970    ADMISSION DATE:  01/15/2014 CONSULTATION DATE:  01/16/14  REFERRING MD :  Dr. Grandville Silos   CHIEF COMPLAINT:  Acute Respiratory Failure   INITIAL PRESENTATION: 43 y/o M, smoker / ETOH abuse, admitted 12/21 with ongoing fatigue, cough with productive yellow sputum, R posterior back pain, chills & sweats.  Work up found patient to have RLL PNA with positive strep antigen.  Developed worsening respiratory failure 12/22  STUDIES:  12/24 EEG >> neg  SIGNIFICANT EVENTS: 12/21  Admit with RLL PNA 12/22  Decompensated, required intubation for respiratory failure in setting of PNA 12/23  Stable on vent, increased pleural effusion on CXR.  Levo @ 2 mcg, Fent 148mcg, Precedex 0.6.  Seizures overnight, Tmax 105 12/24  Concern for ileus with TF's coming out of mouth overnight.  Reglan dosing 12/26  Reintubated due to inability to clear secretions  SUBJECTIVE: Febrile Sedation demand decreasing with increase in ativan PO yesterday, precedex remains high however.  Secretion decreasing.  VITAL SIGNS: Temp:  [99.7 F (37.6 C)-103.8 F (39.9 C)] 100.2 F (37.9 C) (12/30 0800) Pulse Rate:  [85-127] 92 (12/30 0700) Resp:  [16-29] 16 (12/30 0700) BP: (94-120)/(46-76) 111/60 mmHg (12/30 0700) SpO2:  [88 %-100 %] 100 % (12/30 0813) FiO2 (%):  [40 %-45 %] 40 % (12/30 0813)   HEMODYNAMICS: CVP:  [11 mmHg] 11 mmHg   VENTILATOR SETTINGS: Vent Mode:  [-] PRVC FiO2 (%):  [40 %-45 %] 40 % Set Rate:  [16 bmp] 16 bmp Vt Set:  [600 mL] 600 mL PEEP:  [5 cmH20] 5 cmH20 Pressure Support:  [5 cmH20] 5 cmH20 Plateau Pressure:  [18 cmH20-20 cmH20] 19 cmH20   INTAKE / OUTPUT:  Intake/Output Summary (Last 24 hours) at 01/24/14 8588 Last data filed at 01/24/14 0700  Gross per 24 hour  Intake 2104.86 ml  Output    500 ml  Net 1604.86 ml   PHYSICAL EXAMINATION: General: Thin young adult male comfortable on  vent Neuro: Intermittent agitation on vent, RASS 0, follows commands when aroused HEENT:  Dry MM, poor dentition  Cardiovascular:  s1s2 rrr,  no m/r/g  Lungs:  resp's even/non-labored, coarse rhonchi bilaterally R>L, dullness to percussion on right. Abdomen:  NT, mild distention, bsx4 active  Musculoskeletal:  No acute distress  Skin: warm / dry, no edema   LABS:  CBC  Recent Labs Lab 01/22/14 0650 01/23/14 0330 01/24/14 0450  WBC 12.9* 16.3* 23.8*  HGB 8.1* 8.1* 7.8*  HCT 25.3* 25.5* 23.5*  PLT 686* 774* 805*   BMET  Recent Labs Lab 01/22/14 0650 01/23/14 0330 01/24/14 0450  NA 141 141 138  K 4.1 4.4 4.6  CL 105 106 104  CO2 34* 31 28  BUN 18 28* 42*  CREATININE 0.53 1.03 1.47*  GLUCOSE 106* 111* 99   Electrolytes  Recent Labs Lab 01/21/14 0600 01/22/14 0650 01/23/14 0330 01/24/14 0450  CALCIUM 7.1* 7.3* 7.6* 6.8*  MG 1.9  --  1.6 1.8  PHOS 2.8  --  4.0 5.0*   Sepsis Markers  Recent Labs Lab 01/18/14 0300  PROCALCITON 11.55   ABG  Recent Labs Lab 01/23/14 0438 01/24/14 0418  PHART 7.452* 7.483*  PCO2ART 43.9 36.4  PO2ART 66.5* 63.6*   Liver Enzymes No results for input(s): AST, ALT, ALKPHOS, BILITOT, ALBUMIN in the last 168 hours. Glucose  Recent Labs Lab 01/23/14 [phone] 01/23/14 1159 01/23/14 1729 01/23/14 1957 01/24/14 0026  01/24/14 0416  GLUCAP 115* 106* 103* 95 111* 89    Imaging Dg Chest Port 1 View  01/23/2014   CLINICAL DATA:  Followup right thoracentesis.  EXAM: PORTABLE CHEST - 1 VIEW  COMPARISON:  Earlier same day.  FINDINGS: Endotracheal tube has its tip 2 cm above the carina. Nasogastric tube enters the abdomen. Left internal jugular central line has its tip at the SVC RA junction. There is less pleural density on the right. No pneumothorax. Volume loss persists at both lung bases.  IMPRESSION: Less pleural density on the right following thoracentesis. No apparent complication. No other change.   Electronically Signed    By: Nelson Chimes M.D.   On: 01/23/2014 13:11   Dg Chest Port 1 View  01/23/2014   CLINICAL DATA:  Hypoxia  EXAM: PORTABLE CHEST - 1 VIEW  COMPARISON:  January 22, 2014  FINDINGS: Endotracheal tube tip is 3.4 cm above the carina. Central catheter tip is in the superior vena cava. Nasogastric tube tip and side port are below the diaphragm. No pneumothorax. There are bilateral pleural effusions, larger on the right than on the left. Effusion on the right appears loculated. There is consolidation in the left lower lobe. There is perihilar interstitial edema on the left. Heart size and pulmonary vascularity are within normal limits. No adenopathy.  IMPRESSION: Tube and catheter positions as described without pneumothorax. Bilateral effusions, larger on the right than on the left. Right effusion is at least partially loculated. Perihilar interstitial edema is noted on the left, stable. There is left lower lobe consolidation, stable. No new opacity overall. No change in cardiac silhouette.   Electronically Signed   By: Lowella Grip M.D.   On: 01/23/2014 07:47   ASSESSMENT / PLAN:  PULMONARY OETT 12/22 >> 12/26>>12/26>>> A: Acute Hypoxic Respiratory Failure - in setting of RLL PNA.  Minimal O2 requirements. RLL CAP - with positive strep antigen R Pleural Effusion - R pleural space assessed with [place] 12/23, not enough fluid for thora.   Tobacco Abuse P:   - Continue PS trials, hold extubation with mental status and worsening sepsis. - Monitor secretion control. - See neuro section. - Xopenex/Atrovent Q6 + Q3 PRN xopenex. - Monitor R pleural effusion - thora today given fever and organization of effusion on CXR, elevation in WBC. - Mucomyst as ordered to assist with secretion control - Hold further diureses. - Reculture 12/30.  CARDIOVASCULAR CVL L IJ TLC 12/22 >> 12/30, R IJ TLC 12/30>>> A:  Severe Sepsis - in setting of PNA.  Required pressors 12/23, off 12/24 Tachycardia  P:  - Hold  further diureses given renal function. - Monitor. - Will order levophed and titrate for MAP of 85 mmHg, patient is a poorly controlled hypertensive and is likely more used to higher pressures. - Replace TLC today.  RENAL A:   Hypokalemia  Hypomagnesemia  Hypocalcemia Renal function deteriorating.  Patient is likely a poorly controlled hypertensive and current fluctuation is pressure with sepsis are resulting in renal deterioration. P:   - Trend BMP. - Replace electrolytes as indicated. - Hold further diureses given renal function. - Follow CVP with target 8-12 cmH2O.  GASTROINTESTINAL A:   Protein Calorie Malnutrition - albumin 2.0 Ileus - no evidence for bowel obstruction 12/23 -improved with reglan P:   - TF as ordered. - Colace for constipation. - PPI.  HEMATOLOGIC A:   Macrocytic Anemia - in setting of ETOH abuse P:  - Thiamine / Folate / MVI. -  Trend CBC. - Heparin for DVT prophylaxis.  INFECTIOUS A:   RLL PNA - strep antigen positive  R/O infected pleural space  Fever - tmax 105 12/23 WBC increasing and febrile, ?empyema. P:   - BCx2 12/21 >>strep pneumo sens - UC  12/21 >> ng - Sputum 12/21 >> moderate strep pneumoniae - Blood 12/30>>> - Urine 12/30>>> - Sputum 12/30>>>  - Abx: Rocephin, start date 12/21>>>12/30. - Abx: Azithro, 12/21>>12/25 - Vancomycin 12/30>>> - Zosyn 12/30>>>  - Tylenol given high temps / seizures, Q6 PRN  - Thora with fluid analysis exudative but cultures are pending. - Reculture 12/30. - Change TLC 12/30. - Broaden coverage as above.  ENDOCRINE A:   No acute issues  P:   - Monitor glucose on BMP  NEUROLOGIC A:   ETOH Abuse - at risk withdrawal Seizures - x2 overnight 12/23, fever up to 105 +/- ETOH withdrawal Anxiety / Depression  Concern for wernicke's per neuro. P:   - RASS goal: -1 - Continue PO ativan increase to 2 mg PO TID. - Fentanyl gtt for pain. - Precedex gtt currently at 1.2 - Neurology consult,  appreciate input. - D/c wellbutrin, zoloft  (lower threshold) - D/C Keppra. - High dose thiamine per neuro now down to 1 gm aday.  FAMILY  - Updates: wife 12/28, all questions answered.  Summary - CAP, ETOH withdrawal, septic shock resolved, failed extubation for secretions 12/26, persistent fever - evaluate rt effusion.  Will thora today with the hope that it is not an empyema.  Lasix ordered.  Wean as able.  The patient is critically ill with multiple organ systems failure and requires high complexity decision making for assessment and support, frequent evaluation and titration of therapies, application of advanced monitoring technologies and extensive interpretation of multiple databases. Critical Care Time devoted to patient care services described in this note independent of APP time is 35 minutes.   Rush Farmer, M.D. [facility] Pulmonary/Critical Care Medicine. Pager: [phone]. After hours pager: [phone].  01/24/2014, 9:22 AM

## 2014-01-24 NOTE — Procedures (Signed)
Central Venous Catheter Insertion Procedure Note Wiliam Cauthorn [id number] Jul 30, 1970  Procedure: Insertion of Central Venous Catheter Indications: Assessment of intravascular volume, Drug and/or fluid administration and Frequent blood sampling  Procedure Details Consent: Risks of procedure as well as the alternatives and risks of each were explained to the (patient/caregiver).  Consent for procedure obtained. Time Out: Verified patient identification, verified procedure, site/side was marked, verified correct patient position, special equipment/implants available, medications/allergies/relevent history reviewed, required imaging and test results available.  Performed  Maximum sterile technique was used including antiseptics, cap, gloves, gown, hand hygiene, mask and sheet. Skin prep: Chlorhexidine; local anesthetic administered A antimicrobial bonded/coated triple lumen catheter was placed in the right subclavian vein using the Seldinger technique.  Evaluation Blood flow good Complications: No apparent complications Patient did tolerate procedure well. Chest X-ray ordered to verify placement.  CXR: pending.  U/S used in placement.  YACOUB,WESAM 01/24/2014, 10:54 AM

## 2014-01-24 NOTE — Progress Notes (Signed)
ANTIBIOTIC CONSULT NOTE - INITIAL  Pharmacy Consult for Vancomycin, Zosyn Indication: Sepsis  No Known Allergies  Patient Measurements: Height:  (71) Weight: 170 lb 3.1 oz (77.2 kg) IBW/kg (Calculated) : 75.3  Vital Signs: Temp: 100.2 F (37.9 C) (12/30 0800) Temp Source: Oral (12/30 0800) BP: 111/60 mmHg (12/30 0700) Pulse Rate: 92 (12/30 0700) Intake/Output from previous day: 12/29 0701 - 12/30 0700 In: 2270.1 [I.V.:1170.1; NG/GT:1050; IV Piggyback:50] Out: 500 [Urine:500] Intake/Output from this shift:    Labs:  Recent Labs  01/22/14 0650 01/23/14 0330 01/24/14 0450  WBC 12.9* 16.3* 23.8*  HGB 8.1* 8.1* 7.8*  PLT 686* 774* 805*  CREATININE 0.53 1.03 1.47*   Estimated Creatinine Clearance: 67.6 mL/min (by C-G formula based on Cr of 1.47). No results for input(s): VANCOTROUGH, VANCOPEAK, VANCORANDOM, GENTTROUGH, GENTPEAK, GENTRANDOM, TOBRATROUGH, TOBRAPEAK, TOBRARND, AMIKACINPEAK, AMIKACINTROU, AMIKACIN in the last 72 hours.   Microbiology: Recent Results (from the past 720 hour(s))  MRSA PCR Screening     Status: None   Collection Time: 01/15/14  8:24 PM  Result Value Ref Range Status   MRSA by PCR NEGATIVE NEGATIVE Final    Comment:        The GeneXpert MRSA Assay (FDA approved for NASAL specimens only), is one component of a comprehensive MRSA colonization surveillance program. It is not intended to diagnose MRSA infection nor to guide or monitor treatment for MRSA infections.   Culture, sputum-assessment     Status: None   Collection Time: 01/15/14  8:40 PM  Result Value Ref Range Status   Specimen Description SPUTUM  Final   Special Requests NONE  Final   Sputum evaluation   Final    THIS SPECIMEN IS ACCEPTABLE. RESPIRATORY CULTURE REPORT TO FOLLOW.   Report Status 01/15/2014 FINAL  Final  Culture, respiratory (NON-Expectorated)     Status: None   Collection Time: 01/15/14  8:40 PM  Result Value Ref Range Status   Specimen Description SPUTUM   Final   Special Requests NONE  Final   Gram Stain   Final    FEW WBC PRESENT,BOTH PMN AND MONONUCLEAR RARE SQUAMOUS EPITHELIAL CELLS PRESENT FEW GRAM POSITIVE COCCI IN PAIRS Performed at [facility]    Culture   Final    MODERATE STREPTOCOCCUS PNEUMONIAE Performed at [facility]    Report Status 01/19/2014 FINAL  Final   Organism ID, Bacteria STREPTOCOCCUS PNEUMONIAE  Final      Susceptibility   Streptococcus pneumoniae - MIC (ETEST)*    CEFTRIAXONE 0.016 SENSITIVE Sensitive     LEVOFLOXACIN 0.75 SENSITIVE Sensitive     PENICILLIN 0.023 SENSITIVE Sensitive     * MODERATE STREPTOCOCCUS PNEUMONIAE  Culture, respiratory (NON-Expectorated)     Status: None   Collection Time: 01/16/14 10:11 AM  Result Value Ref Range Status   Specimen Description TRACHEAL ASPIRATE  Final   Special Requests Normal  Final   Gram Stain   Final    MODERATE WBC PRESENT, PREDOMINANTLY PMN NO SQUAMOUS EPITHELIAL CELLS SEEN FEW GRAM NEGATIVE COCCOBACILLI Performed at [facility]    Culture   Final    MODERATE STREPTOCOCCUS PNEUMONIAE Performed at [facility]    Report Status 01/20/2014 FINAL  Final   Organism ID, Bacteria STREPTOCOCCUS PNEUMONIAE  Final      Susceptibility   Streptococcus pneumoniae - MIC (ETEST)*    CEFTRIAXONE 0.023 SENSITIVE Sensitive     LEVOFLOXACIN 1.0 SENSITIVE Sensitive     PENICILLIN 0.023 SENSITIVE Sensitive     *  MODERATE STREPTOCOCCUS PNEUMONIAE  Culture, Urine     Status: None   Collection Time: 01/16/14 10:48 PM  Result Value Ref Range Status   Specimen Description URINE, CATHETERIZED  Final   Special Requests NONE  Final   Culture  Setup Time   Final    01/17/2014 11:37 Performed at [place] Performed at [facility]   Final   Culture NO GROWTH Performed at [facility]   Final   Report Status 01/18/2014 FINAL  Final    Medical History: Past Medical History   Diagnosis Date  . Alcohol abuse   . Depression   . Anxiety   . Pneumonia     Medications:  Infusions:  . sodium chloride 10 mL/hr at 01/23/14 2000  . dexmedetomidine 1.2 mcg/kg/hr (01/24/14 0539)  . feeding supplement (VITAL AF 1.2 CAL) 1,000 mL (01/24/14 0504)  . fentaNYL infusion INTRAVENOUS 100 mcg/hr (01/23/14 2300)  . midazolam (VERSED) infusion 3 mg/hr (01/24/14 0600)  . norepinephrine (LEVOPHED) Adult infusion     Anti-infectives    Start     Dose/Rate Route Frequency Ordered Stop   01/24/14 1000  piperacillin-tazobactam (ZOSYN) IVPB 3.375 g     3.375 g12.5 mL/hr over 240 Minutes Intravenous Every 8 hours 01/24/14 0930     01/16/14 1900  azithromycin (ZITHROMAX) 500 mg in dextrose 5 % 250 mL IVPB  Status:  Discontinued     500 mg250 mL/hr over 60 Minutes Intravenous Every 24 hours 01/15/14 1949 01/15/14 2036   01/16/14 1900  cefTRIAXone (ROCEPHIN) 1 g in dextrose 5 % 50 mL IVPB  Status:  Discontinued     1 g100 mL/hr over 30 Minutes Intravenous  Once 01/15/14 1949 01/15/14 2039   01/16/14 1800  azithromycin (ZITHROMAX) 500 mg in dextrose 5 % 250 mL IVPB  Status:  Discontinued     500 mg250 mL/hr over 60 Minutes Intravenous Every 24 hours 01/15/14 2036 01/18/14 0947   01/16/14 1600  cefTRIAXone (ROCEPHIN) 1 g in dextrose 5 % 50 mL IVPB  Status:  Discontinued     1 g100 mL/hr over 30 Minutes Intravenous Every 24 hours 01/15/14 2039 01/16/14 0728   01/16/14 1100  cefTRIAXone (ROCEPHIN) 2 g in dextrose 5 % 50 mL IVPB - Premix  Status:  Discontinued     2 g100 mL/hr over 30 Minutes Intravenous Every 24 hours 01/16/14 0922 01/24/14 0930   01/16/14 0900  vancomycin (VANCOCIN) IVPB 1000 mg/200 mL premix  Status:  Discontinued     1,000 mg200 mL/hr over 60 Minutes Intravenous Every 8 hours 01/16/14 0828 01/16/14 0922   01/16/14 0900  ceFEPIme (MAXIPIME) 1 g in dextrose 5 % 50 mL IVPB  Status:  Discontinued     1 g100 mL/hr over 30 Minutes Intravenous Every 8 hours 01/16/14 0828  01/16/14 0922   01/16/14 0830  ceFEPIme (MAXIPIME) 2 g in dextrose 5 % 50 mL IVPB  Status:  Discontinued     2 g100 mL/hr over 30 Minutes Intravenous Every 24 hours 01/16/14 0817 01/16/14 0828   01/15/14 1830  cefTRIAXone (ROCEPHIN) 1 g in dextrose 5 % 50 mL IVPB     1 g100 mL/hr over 30 Minutes Intravenous  Once 01/15/14 1815 01/15/14 1850   01/15/14 1830  azithromycin (ZITHROMAX) 500 mg in dextrose 5 % 250 mL IVPB     500 mg250 mL/hr over 60 Minutes Intravenous  Once 01/15/14 1815 01/15/14 1943  Assessment 43yoM w/ productive cough x 4 days, endorses chills/sweating, now SOB. Started on ceftriaxone/azithromycin in hospital w/o improvement; CXR showed large RLL consolidation + pleural effusion. Rx consulted to dose vanc/cefepime but before any doses were given, it was changed back to ceftriaxone for Strep pneumo pneumonia/bacteremia. 12/29: thoracentesis was performed and fluid sent for culture. 12/30: cont persistant fever, hypotension, worsening renal function so switched abx to Vanc and Zosyn for suspected sepsis/HCAP. Re-culturing blood and sputum.  12/21 >> Azithromycin >> 12/23 12/21 >> Ceftriaxone >> 12/30 12/30 >> Zosyn >> 12/30 >> Vanc >>  Tmax: remains febrile. 102.3 WBC: elevated, cont to trend. Renal: SCr 1.47, up signif (while on Rocephin only). CrCl 68CG PCT: improved to 11.55 (12/23)  12/21 blood: S. Pneumo, pan sens 12/21 sputum: Strep Pneumo pan-sens 12/21 Legionella UAg: negative 12/21 Strep Pneumo UAg: POSITIVE 12/21 flu panel: negative 12/21 MRSA swab: negative 12/21 HIV Ab: IP 12/22 urine: neg 12/22 trach aspirate: S. Pneumo pan-sens 12/29 pleural fluid: 12/30 blood x 2: 12/30 sputum:  Goal of Therapy:  Vancomycin trough level 15-20 mcg/ml  Appropriate antibiotic dosing for renal function; eradication of infection  Plan:  Begin Zosyn 3.375g IV Q8H infused over 4hrs. Begin Vanc 1g IV q12h. Measure Vanc trough at steady state. Follow up renal fxn  and culture results.  Romeo Rabon, PharmD, pager [phone]. 01/24/2014,9:57 AM.

## 2014-01-24 NOTE — Progress Notes (Addendum)
NUTRITION FOLLOW UP  Intervention:   Initiate Vital AF 1.2 @ 20 ml/hr via OGT and increase by 10 ml every 4 hours to goal rate of 75 ml/hr.   Tube feeding regimen provides 2160 kcal (91% of needs), 135 grams of protein, and 1450 ml of H2O.   -RD to continue to monitor  Nutrition Dx:   Inadequate oral intake related to inability to eat as evidenced by NPO; ongoing  Goal:   Pt to meet >/= 90% of their estimated nutrition needs; not met  Monitor:   Weight trend, NPO, vent status/settings, labs, TF initiation and tolerance  Assessment:   43 y/o M, smoker / ETOH abuse, admitted 12/21 with ongoing fatigue, cough with productive yellow sputum, R posterior back pain, chills & sweats. Work up found patient to have RLL PNA with positive strep antigen. Developed worsening respiratory failure 12/22  TF held d/t possible extubation. Extubation attempted but failed. Pt was re-intubated. Per MD note, concerned for ileus. TF coming out of patient's mouth overnight.  Per RN, pt was tolerating TF fine with no problems.  RD to initiate same TF formula as before extubation attempt. Fever reduced, needs re-estimated and rate adjusted accordingly.  Patient is currently intubated on ventilator support MV: 9.0 L/min Temp (24hrs), Avg:101.6 F (38.7 C), Min:99.7 F (37.6 C), Max:103.8 F (39.9 C)   12/30: -Vital AF 1.2 continues to infuse at 40 ml/hr; providing 1152 kcal (55% est kcal needs), 72 gram protein (60% est protein needs) -Received Reglan; per discussion with RN, now tolerating with minimal residuals, 90 ml residuals noted -Last BM on 12/26; receiving Colace for constipation -Recommend to continue to advance to goal rate as current regimen providing <75% of estimated nutrition needs -Received approval to advance from NP    Height: Ht Readings from Last 1 Encounters:  01/15/14 _0  (1.803 m)    Weight Status:   Wt Readings from Last 1 Encounters:  01/23/14 170 lb 3.1 oz (77.2  kg)  01/20/14 167 lb  Re-estimated needs:  Kcal: 2370- adjusted d/t fevers Protein: 120-130 g Fluid: Per MD  Skin: Intact  Diet Order: Diet NPO time specified   Intake/Output Summary (Last 24 hours) at 01/24/14 0936 Last data filed at 01/24/14 0700  Gross per 24 hour  Intake 2104.86 ml  Output    500 ml  Net 1604.86 ml    Last BM: 12/24   Labs:   Recent Labs Lab 01/21/14 0600 01/22/14 0650 01/23/14 0330 01/24/14 0450  NA 141 141 141 138  K 3.6 4.1 4.4 4.6  CL 106 105 106 104  CO2 33* 34* 31 28  BUN 11 18 28* 42*  CREATININE 0.43* 0.53 1.03 1.47*  CALCIUM 7.1* 7.3* 7.6* 6.8*  MG 1.9  --  1.6 1.8  PHOS 2.8  --  4.0 5.0*  GLUCOSE 106* 106* 111* 99    CBG (last 3)   Recent Labs  01/23/14 1957 01/24/14 0026 01/24/14 0416  GLUCAP 95 111* 89    Scheduled Meds: . antiseptic oral rinse  7 mL Mouth Rinse QID  . chlorhexidine  15 mL Mouth Rinse BID  . enoxaparin (LOVENOX) injection  30 mg Subcutaneous Q24H  . folic acid  1 mg Oral Daily  . guaiFENesin  20 mL Per Tube 6 times per day  . ipratropium  0.5 mg Nebulization Q6H  . levalbuterol  0.63 mg Nebulization Q6H  . LORazepam  2 mg Per Tube TID  . multivitamin  5 mL  Per Tube Daily  . nicotine  14 mg Transdermal Daily  . pantoprazole sodium  40 mg Per Tube Q1200  . piperacillin-tazobactam (ZOSYN)  IV  3.375 g Intravenous Q8H  . thiamine  100 mg Oral Daily   Or  . thiamine  100 mg Intravenous Daily    Continuous Infusions: . sodium chloride 10 mL/hr at 01/23/14 2000  . dexmedetomidine 1.2 mcg/kg/hr (01/24/14 0539)  . feeding supplement (VITAL AF 1.2 CAL) 1,000 mL (01/24/14 0504)  . fentaNYL infusion INTRAVENOUS 100 mcg/hr (01/23/14 2300)  . midazolam (VERSED) infusion 3 mg/hr (01/24/14 0600)  . norepinephrine (LEVOPHED) Adult infusion     Atlee Abide MS RD LDN Clinical Dietitian [phone]

## 2014-01-25 ENCOUNTER — Inpatient Hospital Stay (HOSPITAL_COMMUNITY): Payer: Medicaid Other

## 2014-01-25 DIAGNOSIS — F1020 Alcohol dependence, uncomplicated: Secondary | ICD-10-CM

## 2014-01-25 LAB — CBC
HCT: 22.3 % — ABNORMAL LOW (ref 39.0–52.0)
HEMOGLOBIN: 7.5 g/dL — AB (ref 13.0–17.0)
MCH: 35.9 pg — ABNORMAL HIGH (ref 26.0–34.0)
MCHC: 33.6 g/dL (ref 30.0–36.0)
MCV: 106.7 fL — ABNORMAL HIGH (ref 78.0–100.0)
Platelets: 861 10*3/uL — ABNORMAL HIGH (ref 150–400)
RBC: 2.09 MIL/uL — ABNORMAL LOW (ref 4.22–5.81)
RDW: 13.2 % (ref 11.5–15.5)
WBC: 18.4 10*3/uL — ABNORMAL HIGH (ref 4.0–10.5)

## 2014-01-25 LAB — COMPREHENSIVE METABOLIC PANEL
ALBUMIN: 1.4 g/dL — AB (ref 3.5–5.2)
ALK PHOS: 58 U/L (ref 39–117)
ALT: 20 U/L (ref 0–53)
AST: 23 U/L (ref 0–37)
Anion gap: 6 (ref 5–15)
BUN: 48 mg/dL — ABNORMAL HIGH (ref 6–23)
CHLORIDE: 103 meq/L (ref 96–112)
CO2: 28 mmol/L (ref 19–32)
Calcium: 7.4 mg/dL — ABNORMAL LOW (ref 8.4–10.5)
Creatinine, Ser: 1.91 mg/dL — ABNORMAL HIGH (ref 0.50–1.35)
GFR, EST AFRICAN AMERICAN: 47 mL/min — AB (ref >90)
GFR, EST NON AFRICAN AMERICAN: 41 mL/min — AB (ref >90)
Glucose, Bld: 94 mg/dL (ref 70–99)
POTASSIUM: 4.5 mmol/L (ref 3.5–5.1)
SODIUM: 137 mmol/L (ref 135–145)
TOTAL PROTEIN: 4.8 g/dL — AB (ref 6.0–8.3)

## 2014-01-25 LAB — BLOOD GAS, ARTERIAL
ACID-BASE EXCESS: 0.3 mmol/L (ref 0.0–2.0)
Bicarbonate: 24.7 mEq/L — ABNORMAL HIGH (ref 20.0–24.0)
Drawn by: 31814
FIO2: 0.4 %
MECHVT: 600 mL
O2 SAT: 93.3 %
PATIENT TEMPERATURE: 38.3
PEEP: 5 cmH2O
PO2 ART: 79.9 mmHg — AB (ref 80.0–100.0)
RATE: 16 resp/min
TCO2: 22.8 mmol/L (ref 0–100)
pCO2 arterial: 44.1 mmHg (ref 35.0–45.0)
pH, Arterial: 7.375 (ref 7.350–7.450)

## 2014-01-25 LAB — SODIUM, URINE, RANDOM: SODIUM UR: 34 meq/L

## 2014-01-25 LAB — PHOSPHORUS: Phosphorus: 6.2 mg/dL — ABNORMAL HIGH (ref 2.3–4.6)

## 2014-01-25 LAB — CREATININE, URINE, RANDOM: CREATININE, URINE: 258 mg/dL

## 2014-01-25 LAB — MAGNESIUM: MAGNESIUM: 2 mg/dL (ref 1.5–2.5)

## 2014-01-25 MED ORDER — FLEET ENEMA 7-19 GM/118ML RE ENEM
1.0000 | ENEMA | Freq: Once | RECTAL | Status: AC
  Administered 2014-01-25: 1 via RECTAL
  Filled 2014-01-25: qty 1

## 2014-01-25 MED ORDER — DEXTROSE 5 % IV SOLN
160.0000 mg | Freq: Once | INTRAVENOUS | Status: AC
  Administered 2014-01-25: 160 mg via INTRAVENOUS
  Filled 2014-01-25: qty 16

## 2014-01-25 MED ORDER — LORAZEPAM 2 MG/ML PO CONC
3.0000 mg | Freq: Three times a day (TID) | ORAL | Status: DC
Start: 2014-01-25 — End: 2014-01-26
  Administered 2014-01-25 – 2014-01-26 (×5): 3 mg
  Filled 2014-01-25 (×5): qty 2

## 2014-01-25 NOTE — Progress Notes (Signed)
54ml of 61mcg/ml of fentanyl wasted in sink. Witness by Park Breed, RN.

## 2014-01-25 NOTE — Progress Notes (Signed)
PULMONARY / CRITICAL CARE MEDICINE   Name: Allen Wood MRN: [id number] DOB: October 15, 1970    ADMISSION DATE:  01/15/2014 CONSULTATION DATE:  01/16/14  REFERRING MD :  Dr. Grandville Silos   CHIEF COMPLAINT:  Acute Respiratory Failure   INITIAL PRESENTATION: 43 y/o M, smoker / ETOH abuse, admitted 12/21 with ongoing fatigue, cough with productive yellow sputum, R posterior back pain, chills & sweats.  Work up found patient to have RLL PNA with positive strep antigen.  Developed worsening respiratory failure 12/22  STUDIES:  12/24 EEG >> neg  SIGNIFICANT EVENTS: 12/21  Admit with RLL PNA 12/22  Decompensated, required intubation for respiratory failure in setting of PNA 12/23  Stable on vent, increased pleural effusion on CXR.  Levo @ 2 mcg, Fent 180mcg, Precedex 0.6.  Seizures overnight, Tmax 105 12/24  Concern for ileus with TF's coming out of mouth overnight.  Reglan dosing 12/26  Reintubated due to inability to clear secretions 12/30 Cr rising, UOP dropping. 12/31 Cr up to 1.9, UOP 250 ml over 24 hours, renal consult called.  SUBJECTIVE: Febrile Arousable, remains on versed/fentanyl/precedex but dose is dropping with addition of PO ativan.  VITAL SIGNS: Temp:  [97.2 F (36.2 C)-101.1 F (38.4 C)] 100.9 F (38.3 C) (12/31 0700) Pulse Rate:  [78-116] 86 (12/31 0700) Resp:  [14-25] 16 (12/31 0700) BP: (90-147)/(49-87) 127/71 mmHg (12/31 0700) SpO2:  [88 %-100 %] 93 % (12/31 0700) FiO2 (%):  [40 %] 40 % (12/31 0700) Weight:  [79.6 kg (175 lb 7.8 oz)-83.6 kg (184 lb 4.9 oz)] 83.6 kg (184 lb 4.9 oz) (12/31 0600)   HEMODYNAMICS: CVP:  [12 mmHg-14 mmHg] 12 mmHg   VENTILATOR SETTINGS: Vent Mode:  [-] PRVC FiO2 (%):  [40 %] 40 % Set Rate:  [16 bmp] 16 bmp Vt Set:  [600 mL] 600 mL PEEP:  [5 cmH20] [place] Pressure:  [20 cmH20-22 cmH20] 21 cmH20   INTAKE / OUTPUT:  Intake/Output Summary (Last 24 hours) at 01/25/14 0745 Last data filed at 01/25/14 0100  Gross per 24 hour   Intake 1854.97 ml  Output     65 ml  Net 1789.97 ml   PHYSICAL EXAMINATION: General: Thin young adult male comfortable on vent Neuro: Intermittent agitation on vent, RASS 0, follows commands when aroused HEENT:  Dry MM, poor dentition  Cardiovascular:  s1s2 rrr,  no m/r/g  Lungs:  resp's even/non-labored, coarse rhonchi bilaterally R>L, dullness to percussion on right. Abdomen:  NT, mild distention, bsx4 active  Musculoskeletal:  No acute distress  Skin: warm / dry, no edema   LABS:  CBC  Recent Labs Lab 01/23/14 0330 01/24/14 0450 01/25/14 0540  WBC 16.3* 23.8* 18.4*  HGB 8.1* 7.8* 7.5*  HCT 25.5* 23.5* 22.3*  PLT 774* 805* 861*   BMET  Recent Labs Lab 01/23/14 0330 01/24/14 0450 01/25/14 0540  NA 141 138 137  K 4.4 4.6 4.5  CL 106 104 103  CO2 31 28 28   BUN 28* 42* 48*  CREATININE 1.03 1.47* 1.91*  GLUCOSE 111* 99 94   Electrolytes  Recent Labs Lab 01/23/14 0330 01/24/14 0450 01/25/14 0540  CALCIUM 7.6* 6.8* 7.4*  MG 1.6 1.8 2.0  PHOS 4.0 5.0* 6.2*   Sepsis Markers No results for input(s): LATICACIDVEN, PROCALCITON, O2SATVEN in the last 168 hours. ABG  Recent Labs Lab 01/23/14 0438 01/24/14 0418 01/25/14 0307  PHART 7.452* 7.483* 7.375  PCO2ART 43.9 36.4 44.1  PO2ART 66.5* 63.6* 79.9*   Liver Enzymes  Recent  Labs Lab 01/25/14 0540  AST 23  ALT 20  ALKPHOS 58  BILITOT <0.1*  ALBUMIN 1.4*   Glucose  Recent Labs Lab 01/23/14 1957 01/24/14 0026 01/24/14 0416 01/24/14 0803 01/24/14 1133 01/24/14 1518  GLUCAP 95 111* 89 91 109* 154*    Imaging Dg Chest Port 1 View  01/24/2014   CLINICAL DATA:  Central line placement.  EXAM: PORTABLE CHEST - 1 VIEW  COMPARISON:  01/24/2014  FINDINGS: Endotracheal tube is 5 cm above the carina, stable. Bilateral central lines and NG tube are unchanged.  Moderate right pleural effusion and small left pleural effusion, unchanged. Heart is normal size. Bilateral airspace opacities are noted, right  greater than left this most likely reflects edema. No real change since prior study.  IMPRESSION: No significant change in the bilateral airspace opacities, presumably edema. Stable bilateral effusions, right greater than left.  Support devices in stable position.   Electronically Signed   By: Rolm Baptise M.D.   On: 01/24/2014 11:31   Dg Chest Port 1 View  01/24/2014   CLINICAL DATA:  Evaluate endotracheal tube position. Pleural effusions.  EXAM: PORTABLE CHEST - 1 VIEW  COMPARISON:  01/23/2014.  FINDINGS: Support apparatus: Endotracheal tube tip 4.2 cm from the carina. Enteric tube present with the tip not visible. LEFT IJ central line is present with the tip in the lower SVC. The support apparatus appears unchanged compared to prior. Monitoring leads project over the chest.  Cardiomediastinal Silhouette: RIGHT heart contour abnormality, which may represent loculated effusion along the heart border or RIGHT atrial enlargement. Cardiopericardial silhouette size is within normal limits.  Lungs: Diffuse basilar predominant airspace disease and basilar atelectasis. Airspace disease most compatible with pulmonary edema. No pneumothorax.  Effusions: Tiny LEFT and small loculated RIGHT. Loculated effusion may account for the RIGHT heart border contour abnormality.  Other:  None.  IMPRESSION: 1. Stable support apparatus. 2. Slight increase in bilateral airspace disease with sounds shifting airspace disease. This is most compatible with pulmonary edema. 3. Small bilateral pleural effusions, likely loculated on the RIGHT. 4. No pneumothorax following recent thoracentesis.   Electronically Signed   By: Dereck Ligas M.D.   On: 01/24/2014 07:42   ASSESSMENT / PLAN:  PULMONARY OETT 12/22 >> 12/26>>12/26>>> A: Acute Hypoxic Respiratory Failure - in setting of RLL PNA.  Minimal O2 requirements. RLL CAP - with positive strep antigen R Pleural Effusion - R pleural space assessed with [place] 12/23, not enough fluid for  thora.   Tobacco Abuse P:   - Continue PS trials, hold extubation with worsening sepsis and hemodynamics. - Monitor secretions. - See neuro section. - Xopenex/Atrovent Q6 + Q3 PRN xopenex. - Mucomyst as ordered to assist with secretion control - Hold further diureses. - Recultured 12/30, f/u. - Abx as below.  CARDIOVASCULAR CVL L IJ TLC 12/22 >> 12/30, R IJ TLC 12/30>>> A:  Severe Sepsis - in setting of PNA.  Required pressors 12/23, off 12/24 Tachycardia  P:  - Hold further diureses given renal function. - Monitor. - Change levophed parameters to 65 mmHg. - Follow CVP.  RENAL A:   Hypokalemia  Hypomagnesemia  Hypocalcemia Renal function deteriorated further, concern is ATN due to sepsis versus strep induced GN (today is day 9 since intubation, maybe too soon). UOP deteriorating but electrolytes remain acceptable. P:   - Trend BMP. - Replace electrolytes as indicated. - Hold further diureses given renal function. - Follow CVP 12 today. - Renal U/S. - Renal consult called.  GASTROINTESTINAL A:   Protein Calorie Malnutrition - albumin 2.0 Ileus - no evidence for bowel obstruction 12/23 -improved with reglan Constipation noted. Etoh by history but no evidence of hepatic dysfunction for the time being. P:   - TF as ordered. - Fleets enema today. - PPI.  HEMATOLOGIC A:   Macrocytic Anemia - in setting of ETOH abuse P:  - Thiamine / Folate / MVI. - Trend CBC. - Heparin for DVT prophylaxis.  INFECTIOUS A:   RLL PNA - strep antigen positive  R/O infected pleural space  Fever - tmax 105 12/23 WBC increasing and febrile, ?empyema. P:   - BCx2 12/21 >>strep pneumo sens - UC  12/21 >> ng - Sputum 12/21 >> moderate strep pneumoniae - Blood 12/30>>> - Urine 12/30>>> - Sputum 12/30>>> - Pleural fluid culture 12/29>>>  - Abx: Rocephin, start date 12/21>>>12/30. - Abx: Azithro, 12/21>>12/25 - Vancomycin 12/30>>> - Zosyn 12/30>>>  - Tylenol given high temps  / seizures, Q6 PRN  - Thora with fluid analysis exudative but cultures are pending. - Recultured 12/30. - Changed TLC 12/30. - Broadened coverage as above.  ENDOCRINE A:   No acute issues  P:   - Monitor glucose on BMP  NEUROLOGIC A:   ETOH Abuse - at risk withdrawal Seizures - x2 overnight 12/23, fever up to 105 +/- ETOH withdrawal Anxiety / Depression  Concern for wernicke's per neuro. P:   - RASS goal: -1 - Continue PO ativan increase to 2 mg PO TID. - Fentanyl gtt for pain. - Precedex gtt currently at 1.2 - Neurology consult, appreciate input. - D/c wellbutrin, zoloft  (lower threshold) - D/Ced Keppra per neuro recommendations. - High dose thiamine per neuro now down to 1 gm aday. - D/C versed and change to pushes.  FAMILY  - Updates: No family bedside.  Summary - CAP, ETOH withdrawal, septic shock, improving with pressors, failed extubation for secretions 12/26, persistent fever - evaluated rt effusion likely parapneumonic.  Renal function deteriorating, renal u/s ordered and renal consult called.  The patient is critically ill with multiple organ systems failure and requires high complexity decision making for assessment and support, frequent evaluation and titration of therapies, application of advanced monitoring technologies and extensive interpretation of multiple databases. Critical Care Time devoted to patient care services described in this note independent of APP time is 35 minutes.   Rush Farmer, M.D. [facility]. Pager: [phone]. After hours pager: [phone].  01/25/2014, 7:45 AM

## 2014-01-25 NOTE — Progress Notes (Addendum)
Subjective: Patient remains intubated.  Sitter in the room.  No further seizures noted.    Objective: Current vital signs: BP 117/77 mmHg  Pulse 82  Temp(Src) 99.5 F (37.5 C) (Core (Comment))  Resp 17  Ht 5\' 11"  (1.803 m)  Wt 83.6 kg (184 lb 4.9 oz)  BMI 25.72 kg/m2  SpO2 97% Vital signs in last 24 hours: Temp:  [99.5 F (37.5 C)-101.3 F (38.5 C)] 99.5 F (37.5 C) (12/31 1300) Pulse Rate:  [78-117] 82 (12/31 1300) Resp:  [14-24] 17 (12/31 1300) BP: (75-147)/(43-87) 117/77 mmHg (12/31 1300) SpO2:  [88 %-98 %] 97 % (12/31 1300) FiO2 (%):  [40 %-50 %] 50 % (12/31 1300) Weight:  [83.6 kg (184 lb 4.9 oz)] 83.6 kg (184 lb 4.9 oz) (12/31 0600)  Intake/Output from previous day: 12/30 0701 - 12/31 0700 In: 3461.6 [I.V.:1579.1; NG/GT:1522.5; IV Piggyback:300] Out: 215 [Urine:215] Intake/Output this shift: Total I/O In: 588.2 [I.V.:308.2; NG/GT:30; IV Piggyback:250] Out: 59 [Urine:59] Nutritional status: Diet NPO time specified  Neurologic Exam:  Mental Status: Intubated and sedated but easily aroused with minimal stimulation.   Cranial Nerves: II: Discs flat bilaterally; Pupils equal (72mm), round, reactive to light and accommodation III,IV, VI: ptosis not present, intact oculocephalic responses bilaterally V,VII: grimace symmetric, intact corneals bilaterally VIII: not tested IX,X: not tested XI: not tested XII: not tested Motor: Moves all extremities spontaneously Deep Tendon Reflexes: Brisk throughout with bilateral ankle clonus Plantars: Right: mute   Left: mute   Lab Results: Basic Metabolic Panel:  Recent Labs Lab 01/20/14 0330 01/21/14 0600 01/22/14 0650 01/23/14 0330 01/24/14 0450 01/25/14 0540  NA 141 141 141 141 138 137  K 3.4* 3.6 4.1 4.4 4.6 4.5  CL 105 106 105 106 104 103  CO2 32 33* 34* 31 28 28   GLUCOSE 99 106* 106* 111* 99 94  BUN 13 11 18  28* 42* 48*  CREATININE 0.44* 0.43* 0.53 1.03 1.47* 1.91*  CALCIUM 6.9* 7.1* 7.3* 7.6* 6.8* 7.4*   MG 1.5 1.9  --  1.6 1.8 2.0  PHOS 1.6* 2.8  --  4.0 5.0* 6.2*    Liver Function Tests:  Recent Labs Lab 01/23/14 1230 01/25/14 0540  AST  --  23  ALT  --  20  ALKPHOS  --  58  BILITOT  --  <0.1*  PROT 4.6* 4.8*  ALBUMIN  --  1.4*   No results for input(s): LIPASE, AMYLASE in the last 168 hours. No results for input(s): AMMONIA in the last 168 hours.  CBC:  Recent Labs Lab 01/21/14 0600 01/22/14 0650 01/23/14 0330 01/24/14 0450 01/25/14 0540  WBC 11.3* 12.9* 16.3* 23.8* 18.4*  HGB 7.8* 8.1* 8.1* 7.8* 7.5*  HCT 24.3* 25.3* 25.5* 23.5* 22.3*  MCV 110.5* 110.5* 109.9* 107.8* 106.7*  PLT 456* 686* 774* 805* 861*    Cardiac Enzymes: No results for input(s): CKTOTAL, CKMB, CKMBINDEX, TROPONINI in the last 168 hours.  Lipid Panel:  Recent Labs Lab 01/23/14 1230  CHOL 111    CBG:  Recent Labs Lab 01/24/14 0026 01/24/14 0416 01/24/14 0803 01/24/14 1133 01/24/14 1518  GLUCAP 111* 41 91 109* 154*    Microbiology: Results for orders placed or performed during the hospital encounter of 01/15/14  MRSA PCR Screening     Status: None   Collection Time: 01/15/14  8:24 PM  Result Value Ref Range Status   MRSA by PCR NEGATIVE NEGATIVE Final    Comment:        The GeneXpert MRSA  Assay (FDA approved for NASAL specimens only), is one component of a comprehensive MRSA colonization surveillance program. It is not intended to diagnose MRSA infection nor to guide or monitor treatment for MRSA infections.   Culture, sputum-assessment     Status: None   Collection Time: 01/15/14  8:40 PM  Result Value Ref Range Status   Specimen Description SPUTUM  Final   Special Requests NONE  Final   Sputum evaluation   Final    THIS SPECIMEN IS ACCEPTABLE. RESPIRATORY CULTURE REPORT TO FOLLOW.   Report Status 01/15/2014 FINAL  Final  Culture, respiratory (NON-Expectorated)     Status: None   Collection Time: 01/15/14  8:40 PM  Result Value Ref Range Status   Specimen  Description SPUTUM  Final   Special Requests NONE  Final   Gram Stain   Final    FEW WBC PRESENT,BOTH PMN AND MONONUCLEAR RARE SQUAMOUS EPITHELIAL CELLS PRESENT FEW GRAM POSITIVE COCCI IN PAIRS Performed at [facility]    Culture   Final    MODERATE STREPTOCOCCUS PNEUMONIAE Performed at [facility]    Report Status 01/19/2014 FINAL  Final   Organism ID, Bacteria STREPTOCOCCUS PNEUMONIAE  Final      Susceptibility   Streptococcus pneumoniae - MIC (ETEST)*    CEFTRIAXONE 0.016 SENSITIVE Sensitive     LEVOFLOXACIN 0.75 SENSITIVE Sensitive     PENICILLIN 0.023 SENSITIVE Sensitive     * MODERATE STREPTOCOCCUS PNEUMONIAE  Culture, respiratory (NON-Expectorated)     Status: None   Collection Time: 01/16/14 10:11 AM  Result Value Ref Range Status   Specimen Description TRACHEAL ASPIRATE  Final   Special Requests Normal  Final   Gram Stain   Final    MODERATE WBC PRESENT, PREDOMINANTLY PMN NO SQUAMOUS EPITHELIAL CELLS SEEN FEW GRAM NEGATIVE COCCOBACILLI Performed at [facility]    Culture   Final    MODERATE STREPTOCOCCUS PNEUMONIAE Performed at [facility]    Report Status 01/20/2014 FINAL  Final   Organism ID, Bacteria STREPTOCOCCUS PNEUMONIAE  Final      Susceptibility   Streptococcus pneumoniae - MIC (ETEST)*    CEFTRIAXONE 0.023 SENSITIVE Sensitive     LEVOFLOXACIN 1.0 SENSITIVE Sensitive     PENICILLIN 0.023 SENSITIVE Sensitive     * MODERATE STREPTOCOCCUS PNEUMONIAE  Culture, Urine     Status: None   Collection Time: 01/16/14 10:48 PM  Result Value Ref Range Status   Specimen Description URINE, CATHETERIZED  Final   Special Requests NONE  Final   Culture  Setup Time   Final    01/17/2014 11:37 Performed at [place] Performed at [facility]   Final   Culture NO GROWTH Performed at [facility]   Final   Report Status 01/18/2014 FINAL  Final  Body fluid culture      Status: None (Preliminary result)   Collection Time: 01/23/14 12:14 PM  Result Value Ref Range Status   Specimen Description PLEURAL RIGHT  Final   Special Requests NONE  Final   Gram Stain PENDING  Incomplete   Culture   Final    NO GROWTH 1 DAY Performed at [facility]    Report Status PENDING  Incomplete  AFB culture with smear     Status: None (Preliminary result)   Collection Time: 01/23/14 12:14 PM  Result Value Ref Range Status   Specimen Description PLEURAL RIGHT  Final   Special  Requests NONE  Final   Acid Fast Smear   Final    NO ACID FAST BACILLI SEEN Performed at [facility]    Culture   Final    CULTURE WILL BE EXAMINED FOR 6 WEEKS BEFORE ISSUING A FINAL REPORT Performed at [facility]    Report Status PENDING  Incomplete  Fungus Culture with Smear     Status: None (Preliminary result)   Collection Time: 01/23/14 12:14 PM  Result Value Ref Range Status   Specimen Description PLEURAL RIGHT  Final   Special Requests NONE  Final   Fungal Smear   Final    NO YEAST OR FUNGAL ELEMENTS SEEN Performed at [facility]    Culture   Final    CULTURE IN PROGRESS FOR FOUR WEEKS Performed at [facility]    Report Status PENDING  Incomplete  Culture, blood (routine x 2)     Status: None (Preliminary result)   Collection Time: 01/24/14 10:00 AM  Result Value Ref Range Status   Specimen Description BLOOD LEFT WRIST  Final   Special Requests BOTTLES DRAWN AEROBIC ONLY 6CC  Final   Culture   Final           BLOOD CULTURE RECEIVED NO GROWTH TO DATE CULTURE WILL BE HELD FOR 5 DAYS BEFORE ISSUING A FINAL NEGATIVE REPORT Performed at [facility]    Report Status PENDING  Incomplete  Culture, blood (routine x 2)     Status: None (Preliminary result)   Collection Time: 01/24/14 10:10 AM  Result Value Ref Range Status   Specimen Description BLOOD RIGHT ARM  Final   Special Requests BOTTLES DRAWN AEROBIC AND ANAEROBIC 10CC   Final   Culture   Final           BLOOD CULTURE RECEIVED NO GROWTH TO DATE CULTURE WILL BE HELD FOR 5 DAYS BEFORE ISSUING A FINAL NEGATIVE REPORT Note: Culture results may be compromised due to an inadequate volume of blood received in culture bottles. Performed at [facility]    Report Status PENDING  Incomplete  Culture, respiratory (NON-Expectorated)     Status: None (Preliminary result)   Collection Time: 01/25/14 12:11 AM  Result Value Ref Range Status   Specimen Description TRACHEAL ASPIRATE  Final   Special Requests NONE  Final   Gram Stain   Final    ABUNDANT WBC PRESENT,BOTH PMN AND MONONUCLEAR RARE SQUAMOUS EPITHELIAL CELLS PRESENT NO ORGANISMS SEEN Performed at [facility]    Culture PENDING  Incomplete   Report Status PENDING  Incomplete    Coagulation Studies: No results for input(s): LABPROT, INR in the last 72 hours.  Imaging: US Renal  01/25/2014   CLINICAL DATA:  Elevated BUN and creatinine, initial evaluation for hydronephrosis  EXAM: RENAL/URINARY TRACT ULTRASOUND COMPLETE  COMPARISON:  None.  FINDINGS: Right Kidney:  Length: 0.2 cm. Echogenicity within normal limits. No mass or hydronephrosis visualized.  Left Kidney:  Length: 11.7 cm. Echogenicity within normal limits. No mass or hydronephrosis visualized.  Bladder:  Decompressed by Foley catheter and therefore not evaluated.  There are bilateral pleural effusions.  IMPRESSION: Kidneys appear normal.  There are bilateral pleural effusions.   Electronically Signed   By: Skipper Cliche M.D.   On: 01/25/2014 09:53   Dg Chest Port 1 View  01/25/2014   CLINICAL DATA:  Intubation.  EXAM: PORTABLE CHEST - 1 VIEW  COMPARISON:  None.  FINDINGS: Endotracheal tube, NG tube, right  subclavian line in good anatomic position. Persistent bilateral infiltrates, right side greater than left. Persistent bilateral pleural effusions. Left lower chest incompletely imaged. No pneumothorax. Heart size stable.   IMPRESSION: 1. Lines and tubes in stable position. 2. Persistent bilateral pulmonary infiltrates right side greater than left. Persistent bilateral pleural effusions.   Electronically Signed   By: Marcello Moores  Register   On: 01/25/2014 07:27   Dg Chest Port 1 View  01/24/2014   CLINICAL DATA:  Central line placement.  EXAM: PORTABLE CHEST - 1 VIEW  COMPARISON:  01/24/2014  FINDINGS: Endotracheal tube is 5 cm above the carina, stable. Bilateral central lines and NG tube are unchanged.  Moderate right pleural effusion and small left pleural effusion, unchanged. Heart is normal size. Bilateral airspace opacities are noted, right greater than left this most likely reflects edema. No real change since prior study.  IMPRESSION: No significant change in the bilateral airspace opacities, presumably edema. Stable bilateral effusions, right greater than left.  Support devices in stable position.   Electronically Signed   By: Rolm Baptise M.D.   On: 01/24/2014 11:31   Dg Chest Port 1 View  01/24/2014   CLINICAL DATA:  Evaluate endotracheal tube position. Pleural effusions.  EXAM: PORTABLE CHEST - 1 VIEW  COMPARISON:  01/23/2014.  FINDINGS: Support apparatus: Endotracheal tube tip 4.2 cm from the carina. Enteric tube present with the tip not visible. LEFT IJ central line is present with the tip in the lower SVC. The support apparatus appears unchanged compared to prior. Monitoring leads project over the chest.  Cardiomediastinal Silhouette: RIGHT heart contour abnormality, which may represent loculated effusion along the heart border or RIGHT atrial enlargement. Cardiopericardial silhouette size is within normal limits.  Lungs: Diffuse basilar predominant airspace disease and basilar atelectasis. Airspace disease most compatible with pulmonary edema. No pneumothorax.  Effusions: Tiny LEFT and small loculated RIGHT. Loculated effusion may account for the RIGHT heart border contour abnormality.  Other:  None.  IMPRESSION: 1.  Stable support apparatus. 2. Slight increase in bilateral airspace disease with sounds shifting airspace disease. This is most compatible with pulmonary edema. 3. Small bilateral pleural effusions, likely loculated on the RIGHT. 4. No pneumothorax following recent thoracentesis.   Electronically Signed   By: Dereck Ligas M.D.   On: 01/24/2014 07:42    Medications:  I have reviewed the patient's current medications. Scheduled: . antiseptic oral rinse  7 mL Mouth Rinse QID  . chlorhexidine  15 mL Mouth Rinse BID  . enoxaparin (LOVENOX) injection  30 mg Subcutaneous Q24H  . folic acid  1 mg Oral Daily  . guaiFENesin  20 mL Per Tube 6 times per day  . ipratropium  0.5 mg Nebulization Q6H  . levalbuterol  0.63 mg Nebulization Q6H  . LORazepam  3 mg Per Tube TID  . multivitamin  5 mL Per Tube Daily  . nicotine  14 mg Transdermal Daily  . pantoprazole sodium  40 mg Per Tube Q1200  . piperacillin-tazobactam (ZOSYN)  IV  3.375 g Intravenous Q8H  . thiamine  100 mg Oral Daily   Or  . thiamine  100 mg Intravenous Daily  . vancomycin  1,000 mg Intravenous Q12H    Assessment/Plan: No further seizures.  Felt to be provoked by ETOH withdrawal.  No antiepileptic therapy indicated at this time.  B12 981.    Recommendations: 1.  Agree with current therapy    LOS: 10 days   Alexis Goodell, MD [facility] [phone] 01/25/2014  1:24 PM

## 2014-01-25 NOTE — Consult Note (Signed)
Renal Service Consult Note [facility]  Allen Wood 01/25/2014 Allen Wood D Requesting Physician:  Dr Nelda Marseille  Reason for Consult:  Acute renal failure HPI: The patient is a 43 y.o. year-old with hx of tobacco/etoh abuse presented on 12/21 with fatigue, SOB, prod cough, chills and sweats. Had RLL PNA w +strep antigen.  Worsening resp failure on 12/22 requiring intubation.  Was extubated then reintubated on 12/26 due to difficulty w secretions.  Over the last 48 hours temp has gone back up to 103 deg F, WBC up to 23 k and now pt in acute renal failure with creat up to 1.0 on 12/28, 1.47 yest and 1.91 today.  UOP poor with 215 cc out yesterday, 144 cc out today.     Rocephin 12/22 > 12/30 stopped Protonix 12/22 > current Vancomycin IV 12/30 > current IV Precedex 12/22 > current IV fentanyl same IV Versed same   ROS  n/a  Past Medical History  Past Medical History  Diagnosis Date  . Alcohol abuse   . Depression   . Anxiety   . Pneumonia    Past Surgical History  Past Surgical History  Procedure Laterality Date  . Mouth surgery      teeth removed.   Family History  Family History  Problem Relation Age of Onset  . Cancer Mother     lung  . Cancer Father     kidney   Social History  reports that he has been smoking Cigarettes.  He has a 10 pack-year smoking history. He has never used smokeless tobacco. He reports that he drinks alcohol. He reports that he does not use illicit drugs. Allergies No Known Allergies Home medications Prior to Admission medications   Medication Sig Start Date End Date Taking? Authorizing Provider  buPROPion (WELLBUTRIN XL) 150 MG 24 hr tablet Take 150 mg by mouth every morning.   Yes Historical Provider, MD  sertraline (ZOLOFT) 50 MG tablet Take 50 mg by mouth at bedtime.   Yes Historical Provider, MD  azithromycin (ZITHROMAX) 500 MG tablet Take 1 tablet (500 mg total) by mouth daily. Patient not taking: Reported on  01/15/2014 09/15/13   [name], MD  predniSONE (DELTASONE) 10 MG tablet Takes 6 tablets for 1 days, then 5 tablets for 1 days, then 4 tablets for 1 days, then 3 tablets for 1 days, then 2 tabs for 1 days, then 1 tab for 1 days, and then stop. Patient not taking: Reported on 01/15/2014 09/15/13   [name], MD   Liver Function Tests  Recent Labs Lab 01/23/14 1230 01/25/14 0540  AST  --  23  ALT  --  20  ALKPHOS  --  58  BILITOT  --  <0.1*  PROT 4.6* 4.8*  ALBUMIN  --  1.4*   No results for input(s): LIPASE, AMYLASE in the last 168 hours. CBC  Recent Labs Lab 01/23/14 0330 01/24/14 0450 01/25/14 0540  WBC 16.3* 23.8* 18.4*  HGB 8.1* 7.8* 7.5*  HCT 25.5* 23.5* 22.3*  MCV 109.9* 107.8* 106.7*  PLT 774* 805* 174*   Basic Metabolic Panel  Recent Labs Lab 01/19/14 0520 01/20/14 0330 01/21/14 0600 01/22/14 0650 01/23/14 0330 01/24/14 0450 01/25/14 0540  NA 140 141 141 141 141 138 137  K 3.4* 3.4* 3.6 4.1 4.4 4.6 4.5  CL 103 105 106 105 106 104 103  CO2 29 32 33* 34* 31 28 28   GLUCOSE 102* 99 106* 106* 111* 99 94  BUN  13 13 11 18  28* 42* 48*  CREATININE 0.57 0.44* 0.43* 0.53 1.03 1.47* 1.91*  CALCIUM 6.6* 6.9* 7.1* 7.3* 7.6* 6.8* 7.4*  PHOS  --  1.6* 2.8  --  4.0 5.0* 6.2*    Filed Vitals:   01/25/14 1545 01/25/14 1630 01/25/14 1645 01/25/14 1700  BP: 106/73 97/61 106/63 100/61  Pulse: 78 82 84 85  Temp: 99.1 F (37.3 C) 99.3 F (37.4 C) 99.3 F (37.4 C) 99.3 F (37.4 C)  TempSrc:      Resp: 18 16 18 17   Height:      Weight:      SpO2: 99% 97% 98% 97%   Exam Sedated on vent, opens eyes minimally to loud voice No rash, cyanosis or gangrene Sclera anicteric, throat clear NO jvd Chest w bilat coarse rhonchi RRR no MRG noted Abd doughy edema of abd wall, no gross ascites or mass GU with foley draining small amts of yellowish urine Ext with diffuse doughy edema 2-3+ LE > UE's Neuro is sedated   UA (lab) 12/30 > 1.025, 5.0, small  bili, 30 prot, neg nit, small LE, small Hb, 3-6 wbc, 3-6 rbc, rare squam epis and many bacteria  UA (undersigned 12/31) > pH 5.0, SG 1.030, trace prot, 1+ Hb, neg LE/nit, 10-15 nondysmorphic rbc's/ hpf, 5-10 WBC's/hpf, 5-10 TEC's/hpf, abundant starch granules, 2+ granular casts, no RBC/ WBC casts CXR 01/25/14 > persistent bilat pulm infiltrates R > L and bilat effusions Renal US normal kidneys Urine Na 34, Ucreat 258   Assessment: 1. Acute renal failure - in setting of strep pneumoniae PNA, vent dependence and new fevers.  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  +findings c/w ATN most likely, possible AIN.  Will stop protonix.  Would not use vancomycin in this patient right now either, will dc.  He is oliguric and vol overloaded, borderline hypotensive on pressors. Strange thing is his urine is Na avid which suggests vol depletion or heart/liver failure, neither of which he appears to have on exam.  Will get echo.  Supportive care, if he deteriorates further at some point will need RRT.  Will give IV lasix x 1.  Will follow. Consider prbc's to get oncotic pressure up.     Plan- as above  Kelly Splinter MD (pgr) [phone]    [phone] 01/25/2014, 5:21 PM

## 2014-01-26 ENCOUNTER — Inpatient Hospital Stay (HOSPITAL_COMMUNITY): Payer: Medicaid Other

## 2014-01-26 DIAGNOSIS — J189 Pneumonia, unspecified organism: Secondary | ICD-10-CM

## 2014-01-26 DIAGNOSIS — J9600 Acute respiratory failure, unspecified whether with hypoxia or hypercapnia: Secondary | ICD-10-CM

## 2014-01-26 DIAGNOSIS — D518 Other vitamin B12 deficiency anemias: Secondary | ICD-10-CM

## 2014-01-26 HISTORY — DX: Pneumonia, unspecified organism: J18.9

## 2014-01-26 LAB — GLUCOSE, CAPILLARY
GLUCOSE-CAPILLARY: 110 mg/dL — AB (ref 70–99)
GLUCOSE-CAPILLARY: 114 mg/dL — AB (ref 70–99)
GLUCOSE-CAPILLARY: 114 mg/dL — AB (ref 70–99)
GLUCOSE-CAPILLARY: 98 mg/dL (ref 70–99)
Glucose-Capillary: 113 mg/dL — ABNORMAL HIGH (ref 70–99)
Glucose-Capillary: 114 mg/dL — ABNORMAL HIGH (ref 70–99)
Glucose-Capillary: 114 mg/dL — ABNORMAL HIGH (ref 70–99)
Glucose-Capillary: 131 mg/dL — ABNORMAL HIGH (ref 70–99)
Glucose-Capillary: 105 mg/dL — ABNORMAL HIGH (ref 70–99)
Glucose-Capillary: 82 mg/dL (ref 70–99)

## 2014-01-26 LAB — APTT: aPTT: 38 seconds — ABNORMAL HIGH (ref 24–37)

## 2014-01-26 LAB — CBC
HCT: 21.1 % — ABNORMAL LOW (ref 39.0–52.0)
HEMATOCRIT: 21.4 % — AB (ref 39.0–52.0)
Hemoglobin: 7 g/dL — ABNORMAL LOW (ref 13.0–17.0)
Hemoglobin: 7.1 g/dL — ABNORMAL LOW (ref 13.0–17.0)
MCH: 35.4 pg — ABNORMAL HIGH (ref 26.0–34.0)
MCH: 35.5 pg — ABNORMAL HIGH (ref 26.0–34.0)
MCHC: 32.7 g/dL (ref 30.0–36.0)
MCHC: 33.6 g/dL (ref 30.0–36.0)
MCV: 108.1 fL — ABNORMAL HIGH (ref 78.0–100.0)
MCV: 105.5 fL — AB (ref 78.0–100.0)
Platelets: 858 10*3/uL — ABNORMAL HIGH (ref 150–400)
Platelets: 843 10*3/uL — ABNORMAL HIGH (ref 150–400)
RBC: 1.98 MIL/uL — AB (ref 4.22–5.81)
RBC: 2 MIL/uL — ABNORMAL LOW (ref 4.22–5.81)
RDW: 13.5 % (ref 11.5–15.5)
RDW: 13.3 % (ref 11.5–15.5)
WBC: 15.8 10*3/uL — ABNORMAL HIGH (ref 4.0–10.5)
WBC: 14.4 10*3/uL — ABNORMAL HIGH (ref 4.0–10.5)

## 2014-01-26 LAB — BLOOD GAS, ARTERIAL
Acid-base deficit: 0.8 mmol/L (ref 0.0–2.0)
BICARBONATE: 23.5 meq/L (ref 20.0–24.0)
DRAWN BY: 31814
FIO2: 0.4 %
LHR: 16 {breaths}/min
MECHVT: 600 mL
O2 Saturation: 92.3 %
PEEP/CPAP: 5 cmH2O
PO2 ART: 73 mmHg — AB (ref 80.0–100.0)
Patient temperature: 37.9
TCO2: 22.1 mmol/L (ref 0–100)
pCO2 arterial: 41.3 mmHg (ref 35.0–45.0)
pH, Arterial: 7.378 (ref 7.350–7.450)

## 2014-01-26 LAB — ABO/RH
ABO/RH(D): O POS
ABO/RH(D): O POS

## 2014-01-26 LAB — BASIC METABOLIC PANEL
ANION GAP: 10 (ref 5–15)
BUN: 56 mg/dL — ABNORMAL HIGH (ref 6–23)
CALCIUM: 7.8 mg/dL — AB (ref 8.4–10.5)
CO2: 26 mmol/L (ref 19–32)
Chloride: 104 mEq/L (ref 96–112)
Creatinine, Ser: 2.22 mg/dL — ABNORMAL HIGH (ref 0.50–1.35)
GFR, EST AFRICAN AMERICAN: 39 mL/min — AB (ref >90)
GFR, EST NON AFRICAN AMERICAN: 34 mL/min — AB (ref >90)
Glucose, Bld: 104 mg/dL — ABNORMAL HIGH (ref 70–99)
Potassium: 4.3 mmol/L (ref 3.5–5.1)
Sodium: 140 mmol/L (ref 135–145)

## 2014-01-26 LAB — PHOSPHORUS
PHOSPHORUS: 8.6 mg/dL — AB (ref 2.3–4.6)
PHOSPHORUS: 9 mg/dL — AB (ref 2.3–4.6)

## 2014-01-26 LAB — PREPARE RBC (CROSSMATCH)

## 2014-01-26 LAB — MAGNESIUM
Magnesium: 2.1 mg/dL (ref 1.5–2.5)
Magnesium: 2 mg/dL (ref 1.5–2.5)

## 2014-01-26 LAB — PROTIME-INR
INR: 1.29 (ref 0.00–1.49)
Prothrombin Time: 16.3 seconds — ABNORMAL HIGH (ref 11.6–15.2)

## 2014-01-26 MED ORDER — PIPERACILLIN-TAZOBACTAM 3.375 G IVPB 30 MIN
3.3750 g | Freq: Four times a day (QID) | INTRAVENOUS | Status: DC
  Administered 2014-01-27 – 2014-01-29 (×10): 3.375 g via INTRAVENOUS
  Filled 2014-01-26 (×12): qty 50

## 2014-01-26 MED ORDER — ALTEPLASE 2 MG IJ SOLR
2.0000 mg | Freq: Once | INTRAMUSCULAR | Status: AC | PRN
  Filled 2014-01-26: qty 2

## 2014-01-26 MED ORDER — PRISMASOL BGK 4/2.5 32-4-2.5 MEQ/L IV SOLN
INTRAVENOUS | Status: DC
  Administered 2014-01-26 – 2014-02-03 (×10): via INTRAVENOUS_CENTRAL
  Filled 2014-01-26 (×11): qty 5000

## 2014-01-26 MED ORDER — PRISMASOL BGK 4/2.5 32-4-2.5 MEQ/L IV SOLN
INTRAVENOUS | Status: DC
  Administered 2014-01-26 – 2014-02-03 (×9): via INTRAVENOUS_CENTRAL
  Filled 2014-01-26 (×11): qty 5000

## 2014-01-26 MED ORDER — PRO-STAT SUGAR FREE PO LIQD
30.0000 mL | Freq: Two times a day (BID) | ORAL | Status: DC
  Administered 2014-01-27 (×2): 30 mL
  Filled 2014-01-26 (×3): qty 30

## 2014-01-26 MED ORDER — HEPARIN SODIUM (PORCINE) 1000 UNIT/ML DIALYSIS
1000.0000 [IU] | INTRAMUSCULAR | Status: DC | PRN
  Filled 2014-01-26: qty 6
  Filled 2014-01-26: qty 3
  Filled 2014-01-26: qty 6

## 2014-01-26 MED ORDER — PRISMASOL BGK 4/2.5 32-4-2.5 MEQ/L IV SOLN
INTRAVENOUS | Status: DC
  Administered 2014-01-26 – 2014-02-03 (×42): via INTRAVENOUS_CENTRAL
  Filled 2014-01-26 (×79): qty 5000

## 2014-01-26 MED ORDER — SODIUM CHLORIDE 0.9 % IV SOLN: Freq: Once | INTRAVENOUS | Status: DC

## 2014-01-26 MED ORDER — VITAL HIGH PROTEIN PO LIQD
1000.0000 mL | ORAL | Status: DC
  Administered 2014-01-27: 1000 mL
  Filled 2014-01-26 (×2): qty 1000

## 2014-01-26 MED ORDER — LORAZEPAM 1 MG PO TABS
3.0000 mg | ORAL_TABLET | Freq: Three times a day (TID) | ORAL | Status: DC
  Administered 2014-01-27 – 2014-01-30 (×11): 3 mg
  Filled 2014-01-26 (×11): qty 3

## 2014-01-26 MED ORDER — HEPARIN (PORCINE) 2000 UNITS/L FOR CRRT
INTRAVENOUS_CENTRAL | Status: DC | PRN
  Filled 2014-01-26: qty 1000

## 2014-01-26 MED ORDER — PANTOPRAZOLE SODIUM 40 MG IV SOLR
40.0000 mg | INTRAVENOUS | Status: DC
  Administered 2014-01-27 – 2014-01-28 (×3): 40 mg via INTRAVENOUS
  Filled 2014-01-26 (×4): qty 40

## 2014-01-26 MED ORDER — FUROSEMIDE 10 MG/ML IJ SOLN
40.0000 mg | Freq: Once | INTRAMUSCULAR | Status: AC
  Administered 2014-01-26: 40 mg via INTRAVENOUS
  Filled 2014-01-26: qty 4

## 2014-01-26 NOTE — Progress Notes (Signed)
[place] Progress Note Patient Name: Allen Wood DOB: [birth year] MRN: [id number]   Date of Service  01/26/2014  HPI/Events of Note  Needs cvvhd per renal  eICU Interventions  To [facility] for cvvhd        Raylene Miyamoto. 01/26/2014, 4:49 PM

## 2014-01-26 NOTE — Progress Notes (Signed)
PULMONARY / CRITICAL CARE MEDICINE   Name: Allen Wood MRN: [id number] DOB: 28-Apr-1970    ADMISSION DATE:  01/15/2014 CONSULTATION DATE:  01/16/14  REFERRING MD :  Dr. Grandville Silos   CHIEF COMPLAINT:  Acute Respiratory Failure   INITIAL PRESENTATION: 44 y/o M, smoker / ETOH abuse, admitted 12/21 with ongoing fatigue, cough with productive yellow sputum, R posterior back pain, chills & sweats.  Work up found patient to have RLL PNA with positive strep antigen.  Developed worsening respiratory failure 12/22  STUDIES:  12/24 EEG >> neg  SIGNIFICANT EVENTS: 12/21  Admit with RLL PNA 12/22  Decompensated, required intubation for respiratory failure in setting of PNA 12/23  Stable on vent, increased pleural effusion on CXR.  Levo @ 2 mcg, Fent 146mcg, Precedex 0.6.  Seizures overnight, Tmax 105 12/24  Concern for ileus with TF's coming out of mouth overnight.  Reglan dosing 12/26  Reintubated due to inability to clear secretions 12/30 Cr rising, UOP dropping. 12/31 Cr up to 1.9, UOP 250 ml over 24 hours, renal consult called. 1/1 transfused 1 U pRBC; TTE > >  SUBJECTIVE: Febrile Failed SBT, oliguric, renal failure  VITAL SIGNS: Temp:  [98.6 F (37 C)-101.8 F (38.8 C)] 101.8 F (38.8 C) (01/01 1400) Pulse Rate:  [78-130] 99 (01/01 1400) Resp:  [14-30] 16 (01/01 1400) BP: (79-149)/(46-86) 105/58 mmHg (01/01 1400) SpO2:  [86 %-100 %] 98 % (01/01 1418) FiO2 (%):  [40 %-50 %] 40 % (01/01 1418) Weight:  [83.7 kg (184 lb 8.4 oz)] 83.7 kg (184 lb 8.4 oz) (01/01 0600)   HEMODYNAMICS: CVP:  [10 mmHg-16 mmHg] 16 mmHg   VENTILATOR SETTINGS: Vent Mode:  [-] PRVC FiO2 (%):  [40 %-50 %] 40 % Set Rate:  [16 bmp] 16 bmp Vt Set:  [600 mL] 600 mL PEEP:  [5 cmH20] 5 cmH20 Pressure Support:  [10 cmH20] 10 cmH20 Plateau Pressure:  [18 cmH20-32 cmH20] 20 cmH20   INTAKE / OUTPUT:  Intake/Output Summary (Last 24 hours) at 01/26/14 1432 Last data filed at 01/26/14 1400  Gross per 24 hour   Intake 2517.6 ml  Output    500 ml  Net 2017.6 ml   PHYSICAL EXAMINATION: General: no distress HEENT: NCAT, EOMi PULM: diminished R base, crackles bilaterally CV: RRR, no mgr Ab: BS+, soft, nontender Ext: anasarca noted throughout Neuro: sedated on vent, intermittently agitated  LABS:  CBC  Recent Labs Lab 01/24/14 0450 01/25/14 0540 01/26/14 0405  WBC 23.8* 18.4* 15.8*  HGB 7.8* 7.5* 7.0*  HCT 23.5* 22.3* 21.4*  PLT 805* 861* 858*   BMET  Recent Labs Lab 01/24/14 0450 01/25/14 0540 01/26/14 0405  NA 138 137 140  K 4.6 4.5 4.3  CL 104 103 104  CO2 28 28 26   BUN 42* 48* 56*  CREATININE 1.47* 1.91* 2.22*  GLUCOSE 99 94 104*   Electrolytes  Recent Labs Lab 01/24/14 0450 01/25/14 0540 01/26/14 0405  CALCIUM 6.8* 7.4* 7.8*  MG 1.8 2.0 2.1  PHOS 5.0* 6.2* 8.6*   Sepsis Markers No results for input(s): LATICACIDVEN, PROCALCITON, O2SATVEN in the last 168 hours. ABG  Recent Labs Lab 01/24/14 0418 01/25/14 0307 01/26/14 0433  PHART 7.483* 7.375 7.378  PCO2ART 36.4 44.1 41.3  PO2ART 63.6* 79.9* 73.0*   Liver Enzymes  Recent Labs Lab 01/25/14 0540  AST 23  ALT 20  ALKPHOS 58  BILITOT <0.1*  ALBUMIN 1.4*   Glucose  Recent Labs Lab 01/24/14 2051 01/24/14 2351 01/25/14 0349 01/25/14 0752 01/25/14  1221 01/25/14 1608  GLUCAP 114* 131* 110* 114* 113* 114*    Imaging US Renal  01/25/2014   CLINICAL DATA:  Elevated BUN and creatinine, initial evaluation for hydronephrosis  EXAM: RENAL/URINARY TRACT ULTRASOUND COMPLETE  COMPARISON:  None.  FINDINGS: Right Kidney:  Length: 0.2 cm. Echogenicity within normal limits. No mass or hydronephrosis visualized.  Left Kidney:  Length: 11.7 cm. Echogenicity within normal limits. No mass or hydronephrosis visualized.  Bladder:  Decompressed by Foley catheter and therefore not evaluated.  There are bilateral pleural effusions.  IMPRESSION: Kidneys appear normal.  There are bilateral pleural effusions.    Electronically Signed   By: Skipper Cliche M.D.   On: 01/25/2014 09:53   Dg Chest Port 1 View  01/25/2014   CLINICAL DATA:  Intubation.  EXAM: PORTABLE CHEST - 1 VIEW  COMPARISON:  None.  FINDINGS: Endotracheal tube, NG tube, right subclavian line in good anatomic position. Persistent bilateral infiltrates, right side greater than left. Persistent bilateral pleural effusions. Left lower chest incompletely imaged. No pneumothorax. Heart size stable.  IMPRESSION: 1. Lines and tubes in stable position. 2. Persistent bilateral pulmonary infiltrates right side greater than left. Persistent bilateral pleural effusions.   Electronically Signed   By: Marcello Moores  Register   On: 01/25/2014 07:27   ASSESSMENT / PLAN:  PULMONARY OETT 12/22 >> 12/26>>12/26>>> A: Acute Hypoxic Respiratory Failure - in setting of RLL Strep pneumonia CAP.  Minimal O2 requirements. R Pleural Effusion - 12/29 thora culture negative.   1/1 Likely pulmonary edema in setting of renal failure Tobacco Abuse P:   - Maintain full support; PSV trials daily as tolerated - daily WUA - Xopenex/Atrovent Q6 + Q3 PRN xopenex. - Abx as below.  CARDIOVASCULAR CVL L IJ TLC 12/22 >> 12/30, R IJ TLC 12/30>>> A:  Severe Sepsis - in setting of PNA.  Required pressors 12/23, off 12/24 Tachycardia  P:  - lasix x1 1/1 - Monitor. - Follow CVP. - f/u TTE  RENAL A:   AKI > likely ATN vs AIN considering clinical scenario; currently no acute need for HD Hypocalcemia P:   - Trend BMP. - Replace electrolytes as indicated. - blood transfusion today with lasix  GASTROINTESTINAL A:   Protein Calorie Malnutrition - albumin 2.0 Constipation noted. Etoh by history but no evidence of hepatic dysfunction for the time being. P:   - TF as ordered. - PPI.  HEMATOLOGIC A:   Macrocytic Anemia - in setting of ETOH abuse P:  - Thiamine / Folate / MVI. - transfuse 1 U PRBC 1/1 - Trend CBC. - Lovenox for DVT prophylaxis.  INFECTIOUS A:    RLL PNA - strep antigen positive  Fever - continues  WBC increasing and febrile, source uncertain, lines are new, culture negative; HCAP? WBC improved with addition of zosyn/vanc P:   - BCx2 12/21 >>strep pneumo sens - UC  12/21 >> ng - Sputum 12/21 >> moderate strep pneumoniae - Blood 12/30>>> - Urine 12/30>>> - Sputum 12/30>>> - Pleural fluid culture 12/29>>>  - Abx: Rocephin, start date 12/21>>>12/30. - Abx: Azithro, 12/21>>12/25 - Vancomycin 12/30>>>12/31 - Zosyn 12/30>>>  - Tylenol given high temps / seizures, Q6 PRN  - monitor closely off of Vanc, continue zosyn for 5 days considering possible HCAP  ENDOCRINE A:   No acute issues  P:   - Monitor glucose on BMP  NEUROLOGIC A:   ETOH Abuse - at risk withdrawal Seizures - x2 overnight 12/23, fever up to 105 +/- ETOH withdrawal Anxiety /  Depression  Concern for wernicke's per neuro. P:   - RASS goal: -1 - Continue PO ativan at 2 mg PO TID. - Fentanyl gtt for pain. - Precedex gtt  - High dose thiamine per neuro  - continue versed gtt   FAMILY  - Updates: Updated wife at bedside 1/1  The patient is critically ill with multiple organ systems failure and requires high complexity decision making for assessment and support, frequent evaluation and titration of therapies, application of advanced monitoring technologies and extensive interpretation of multiple databases. Critical Care Time devoted to patient care services described in this note independent of APP time is 45 minutes.   Roselie Awkward, MD [facility] Pager: [phone] Cell: [phone] If no response, call [phone]   01/26/2014, 2:32 PM

## 2014-01-26 NOTE — Procedures (Signed)
Central Venous Catheter Insertion Procedure Note Jakwon Gayton [id number] 1970/02/20  Procedure: Insertion of Central Venous Catheter Indications: HD  Procedure Details Consent: Risks of procedure as well as the alternatives and risks of each were explained to the (patient/caregiver).  Consent for procedure obtained. Time Out: Verified patient identification, verified procedure, site/side was marked, verified correct patient position, special equipment/implants available, medications/allergies/relevent history reviewed, required imaging and test results available.  Performed  Maximum sterile technique was used including antiseptics, cap, gloves, gown, hand hygiene, mask and sheet. Skin prep: Chlorhexidine; local anesthetic administered A antimicrobial bonded/coated HD catheter was placed in the left internal jugular vein using the Seldinger technique.  Evaluation Blood flow good Complications: No apparent complications Patient did tolerate procedure well. Chest X-ray ordered to verify placement.  CXR: pending.  Lantz Hermann R. 01/26/2014, 10:32 PM  I used ultrasound to locate and access the vein/artery.

## 2014-01-26 NOTE — Progress Notes (Signed)
[facility] Progress Note   Subjective: on vent, UOP poor, did not respond to IV lasix yesterday  Filed Vitals:   01/26/14 1300 01/26/14 1400 01/26/14 1418 01/26/14 1500  BP: 149/82 105/58  116/53  Pulse: 117 99  102  Temp: 101.3 F (38.5 C) 101.8 F (38.8 C)  102 F (38.9 C)  TempSrc:      Resp: 20 16  17   Height:      Weight:      SpO2: 92% 96% 98% 99%   Exam: Sedated on vent, opens eyes minimally to loud voice No rash, cyanosis or gangrene Sclera anicteric, throat w ETT tube NO jvd Chest w bilat coarse rhonchi RRR no MRG noted Abd doughy edema of abd wall, no gross ascites or mass GU with foley draining small amts of yellowish urine Ext 3+ diffuse edema all extremities Neuro is sedated   UA (undersigned 12/31) > pH 5.0, SG 1.030, trace prot, 1+ Hb, neg LE/nit, 10-15 nondysmorphic rbc's/ hpf, 5-10 WBC's/hpf, 5-10 TEC's/hpf, abundant starch granules, 2+ granular casts, no RBC/ WBC casts CXR 01/25/14 > persistent bilat pulm infiltrates R > L and bilat effusions Renal US normal kidneys Urine Na 34, Ucreat 258, FeNa 0.18%   Assessment: 1. Acute renal failure - in setting of strep pneumoniae, septic shock ,s/p course of IV abx.  Suspect ATN , less likely AIN; have stopped PPI and Vanc.  Vol overload is dramatic and worsening , +2 L daily w all the sedation drips , CXR bilat infiltrates and can't exclude pulm edema.  UOP remains poor.  Would recommend CRRT at this time primarily due to volume issues which would like to get under better control if possible.  Have d/w CCM, they will transfer to [facility]. CRRT orders written.   2. Strep PNA 3. Shock, on pressors  Plan- CRRT at [facility], remove vol if BP will tolerate otherwise keep even    Kelly Splinter MD  pager [phone]    cell [phone]  01/26/2014, 4:51 PM     Recent Labs Lab 01/24/14 0450 01/25/14 0540 01/26/14 0405  NA 138 137 140  K 4.6 4.5 4.3  CL 104 103 104  CO2 28 28 26   GLUCOSE 99 94 104*  BUN  42* 48* 56*  CREATININE 1.47* 1.91* 2.22*  CALCIUM 6.8* 7.4* 7.8*  PHOS 5.0* 6.2* 8.6*    Recent Labs Lab 01/23/14 1230 01/25/14 0540  AST  --  23  ALT  --  20  ALKPHOS  --  58  BILITOT  --  <0.1*  PROT 4.6* 4.8*  ALBUMIN  --  1.4*    Recent Labs Lab 01/24/14 0450 01/25/14 0540 01/26/14 0405  WBC 23.8* 18.4* 15.8*  HGB 7.8* 7.5* 7.0*  HCT 23.5* 22.3* 21.4*  MCV 107.8* 106.7* 108.1*  PLT 805* 861* 858*   . sodium chloride   Intravenous Once  . antiseptic oral rinse  7 mL Mouth Rinse QID  . chlorhexidine  15 mL Mouth Rinse BID  . enoxaparin (LOVENOX) injection  30 mg Subcutaneous Q24H  . folic acid  1 mg Oral Daily  . guaiFENesin  20 mL Per Tube 6 times per day  . ipratropium  0.5 mg Nebulization Q6H  . levalbuterol  0.63 mg Nebulization Q6H  . LORazepam  3 mg Per Tube TID  . multivitamin  5 mL Per Tube Daily  . nicotine  14 mg Transdermal Daily  . piperacillin-tazobactam (ZOSYN)  IV  3.375 g Intravenous Q8H  .  thiamine  100 mg Oral Daily   Or  . thiamine  100 mg Intravenous Daily   . sodium chloride 10 mL/hr at 01/23/14 2000  . dexmedetomidine 1.2 mcg/kg/hr (01/26/14 1316)  . feeding supplement (VITAL AF 1.2 CAL) 1,000 mL (01/26/14 0000)  . fentaNYL infusion INTRAVENOUS 75 mcg/hr (01/26/14 1412)  . midazolam (VERSED) infusion 1 mg/hr (01/26/14 1116)  . norepinephrine (LEVOPHED) Adult infusion Stopped (01/25/14 1915)  . dialysis replacement fluid (prismasate)    . dialysis replacement fluid (prismasate)    . dialysate (PRISMASATE)     acetaminophen (TYLENOL) oral liquid 160 mg/5 mL, alteplase, bisacodyl, docusate **OR** docusate, fentaNYL, heparin, heparin, labetalol, levalbuterol, LORazepam, midazolam

## 2014-01-26 NOTE — Progress Notes (Signed)
  Echocardiogram 2D Echocardiogram has been performed.  Allen Wood 01/26/2014, 2:05 PM

## 2014-01-26 NOTE — Progress Notes (Signed)
10cc of versed 1mg /ml  wasted in sink. Witnessed by Alonna Buckler, RN

## 2014-01-26 NOTE — Progress Notes (Addendum)
ADDENDUM Patient transferred to [facility] 1/1 PM for CRRT.   Plan: -change Zosyn to 3.375g IV q6h for CRRT dosing  Anara Cowman D. Tysen Roesler, PharmD, BCPS Clinical Pharmacist Pager: [phone] 01/27/2014 11:02 AM   ANTIBIOTIC CONSULT NOTE - Follow up  Pharmacy Consult for Zosyn Indication: Sepsis  No Known Allergies  Patient Measurements: Height:  (71) Weight: 184 lb 8.4 oz (83.7 kg) IBW/kg (Calculated) : 75.3  Vital Signs: Temp: 99.1 F (37.3 C) (01/01 0830) Temp Source: Core (Comment) (01/01 0400) BP: 109/56 mmHg (01/01 0830) Pulse Rate: 84 (01/01 0830) Intake/Output from previous day: 12/31 0701 - 01/01 0700 In: 3310.8 [I.V.:979.8; NG/GT:1915; IV Piggyback:416] Out: 454 [Urine:454] Intake/Output from this shift: Total I/O In: 136.8 [I.V.:31.8; NG/GT:105] Out: 60 [Urine:60]  Labs:  Recent Labs  01/24/14 0450 01/25/14 0540 01/25/14 0937 01/26/14 0405  WBC 23.8* 18.4*  --  15.8*  HGB 7.8* 7.5*  --  7.0*  PLT 805* 861*  --  858*  LABCREA  --   --  258.0  --   CREATININE 1.47* 1.91*  --  2.22*   Estimated Creatinine Clearance: 44.8 mL/min (by C-G formula based on Cr of 2.22). No results for input(s): VANCOTROUGH, VANCOPEAK, VANCORANDOM, GENTTROUGH, GENTPEAK, GENTRANDOM, TOBRATROUGH, TOBRAPEAK, TOBRARND, AMIKACINPEAK, AMIKACINTROU, AMIKACIN in the last 72 hours.   Microbiology: Recent Results (from the past 720 hour(s))  MRSA PCR Screening     Status: None   Collection Time: 01/15/14  8:24 PM  Result Value Ref Range Status   MRSA by PCR NEGATIVE NEGATIVE Final    Comment:        The GeneXpert MRSA Assay (FDA approved for NASAL specimens only), is one component of a comprehensive MRSA colonization surveillance program. It is not intended to diagnose MRSA infection nor to guide or monitor treatment for MRSA infections.   Culture, sputum-assessment     Status: None   Collection Time: 01/15/14  8:40 PM  Result Value Ref Range Status   Specimen Description SPUTUM   Final   Special Requests NONE  Final   Sputum evaluation   Final    THIS SPECIMEN IS ACCEPTABLE. RESPIRATORY CULTURE REPORT TO FOLLOW.   Report Status 01/15/2014 FINAL  Final  Culture, respiratory (NON-Expectorated)     Status: None   Collection Time: 01/15/14  8:40 PM  Result Value Ref Range Status   Specimen Description SPUTUM  Final   Special Requests NONE  Final   Gram Stain   Final    FEW WBC PRESENT,BOTH PMN AND MONONUCLEAR RARE SQUAMOUS EPITHELIAL CELLS PRESENT FEW GRAM POSITIVE COCCI IN PAIRS Performed at [facility]    Culture   Final    MODERATE STREPTOCOCCUS PNEUMONIAE Performed at [facility]    Report Status 01/19/2014 FINAL  Final   Organism ID, Bacteria STREPTOCOCCUS PNEUMONIAE  Final      Susceptibility   Streptococcus pneumoniae - MIC (ETEST)*    CEFTRIAXONE 0.016 SENSITIVE Sensitive     LEVOFLOXACIN 0.75 SENSITIVE Sensitive     PENICILLIN 0.023 SENSITIVE Sensitive     * MODERATE STREPTOCOCCUS PNEUMONIAE  Culture, respiratory (NON-Expectorated)     Status: None   Collection Time: 01/16/14 10:11 AM  Result Value Ref Range Status   Specimen Description TRACHEAL ASPIRATE  Final   Special Requests Normal  Final   Gram Stain   Final    MODERATE WBC PRESENT, PREDOMINANTLY PMN NO SQUAMOUS EPITHELIAL CELLS SEEN FEW GRAM NEGATIVE COCCOBACILLI Performed at [employer] [facility]  Final    MODERATE STREPTOCOCCUS PNEUMONIAE Performed at [facility]    Report Status 01/20/2014 FINAL  Final   Organism ID, Bacteria STREPTOCOCCUS PNEUMONIAE  Final      Susceptibility   Streptococcus pneumoniae - MIC (ETEST)*    CEFTRIAXONE 0.023 SENSITIVE Sensitive     LEVOFLOXACIN 1.0 SENSITIVE Sensitive     PENICILLIN 0.023 SENSITIVE Sensitive     * MODERATE STREPTOCOCCUS PNEUMONIAE  Culture, Urine     Status: None   Collection Time: 01/16/14 10:48 PM  Result Value Ref Range Status   Specimen Description URINE, CATHETERIZED  Final    Special Requests NONE  Final   Culture  Setup Time   Final    01/17/2014 11:37 Performed at [facility] Performed at [facility]   Final   Culture NO GROWTH Performed at [facility]   Final   Report Status 01/18/2014 FINAL  Final  Body fluid culture     Status: None (Preliminary result)   Collection Time: 01/23/14 12:14 PM  Result Value Ref Range Status   Specimen Description PLEURAL RIGHT  Final   Special Requests NONE  Final   Gram Stain   Final    CYTOSPIN SLIDE WBC PRESENT,BOTH PMN AND MONONUCLEAR NO ORGANISMS SEEN Performed at [facility]    Culture   Final    NO GROWTH 1 DAY Performed at [facility]    Report Status PENDING  Incomplete  AFB culture with smear     Status: None (Preliminary result)   Collection Time: 01/23/14 12:14 PM  Result Value Ref Range Status   Specimen Description PLEURAL RIGHT  Final   Special Requests NONE  Final   Acid Fast Smear   Final    NO ACID FAST BACILLI SEEN Performed at [facility]    Culture   Final    CULTURE WILL BE EXAMINED FOR 6 WEEKS BEFORE ISSUING A FINAL REPORT Performed at [facility]    Report Status PENDING  Incomplete  Fungus Culture with Smear     Status: None (Preliminary result)   Collection Time: 01/23/14 12:14 PM  Result Value Ref Range Status   Specimen Description PLEURAL RIGHT  Final   Special Requests NONE  Final   Fungal Smear   Final    NO YEAST OR FUNGAL ELEMENTS SEEN Performed at [facility]    Culture   Final    CULTURE IN PROGRESS FOR FOUR WEEKS Performed at [facility]    Report Status PENDING  Incomplete  Culture, blood (routine x 2)     Status: None (Preliminary result)   Collection Time: 01/24/14 10:00 AM  Result Value Ref Range Status   Specimen Description BLOOD LEFT WRIST  Final   Special Requests BOTTLES DRAWN AEROBIC ONLY 6CC  Final   Culture   Final           BLOOD CULTURE  RECEIVED NO GROWTH TO DATE CULTURE WILL BE HELD FOR 5 DAYS BEFORE ISSUING A FINAL NEGATIVE REPORT Performed at [facility]    Report Status PENDING  Incomplete  Culture, blood (routine x 2)     Status: None (Preliminary result)   Collection Time: 01/24/14 10:10 AM  Result Value Ref Range Status   Specimen Description BLOOD RIGHT ARM  Final   Special Requests BOTTLES DRAWN AEROBIC AND ANAEROBIC 10CC  Final   Culture   Final  BLOOD CULTURE RECEIVED NO GROWTH TO DATE CULTURE WILL BE HELD FOR 5 DAYS BEFORE ISSUING A FINAL NEGATIVE REPORT Note: Culture results may be compromised due to an inadequate volume of blood received in culture bottles. Performed at [facility]    Report Status PENDING  Incomplete  Culture, respiratory (NON-Expectorated)     Status: None (Preliminary result)   Collection Time: 01/25/14 12:11 AM  Result Value Ref Range Status   Specimen Description TRACHEAL ASPIRATE  Final   Special Requests NONE  Final   Gram Stain   Final    ABUNDANT WBC PRESENT,BOTH PMN AND MONONUCLEAR RARE SQUAMOUS EPITHELIAL CELLS PRESENT NO ORGANISMS SEEN Performed at [facility]    Culture PENDING  Incomplete   Report Status PENDING  Incomplete   Assessment 43yoM w/ productive cough x 4 days, endorses chills/sweating, now SOB. Started on ceftriaxone/azithromycin in hospital w/o improvement; CXR showed large RLL consolidation + pleural effusion. Rx consulted to dose vanc/cefepime but before any doses were given, it was changed back to ceftriaxone for Strep pneumo pneumonia/bacteremia. 12/29: thoracentesis was performed and fluid sent for culture. 12/30: cont persistant fever, hypotension, worsening renal function so switched abx to Vanc and Zosyn for suspected sepsis/HCAP. Re-culturing blood and sputum.  12/21 >> azithromycin >> 12/23 12/21 >> ceftriaxone >> 12/30 12/30 >> Vanc >> 12/31 12/30 >> Zosyn >>  Tmax: AF now WBC: elevated but  improved Renal: BUN/SCr rising, 2.22. CrCl 45CG PCT: improved to 11.55 (12/23)  12/21 blood: S. Pneumo, pan sens 12/21 sputum: Strep Pneumo pan-sens 12/21 Legionella UAg: negative 12/21 Strep Pneumo UAg: POSITIVE 12/21 flu panel: negative 12/21 MRSA swab: negative 12/21 HIV Ab: IP 12/22 urine: neg 12/22 trach aspirate: S. Pneumo pan-sens 12/29 pleural fluid: ngtd (including fungus and AFB) 12/30 blood x 2: ngtd 12/30 urine: 12/31 sputum: ngtd  Goal of Therapy:  Appropriate antibiotic dosing for renal function; eradication of infection  Plan:  Cont Zosyn 3.375g IV Q8H infused over 4hrs. Follow up renal fxn and culture results.  Romeo Rabon, PharmD, pager [phone]. 01/26/2014,10:47 AM.

## 2014-01-26 NOTE — Progress Notes (Signed)
eLink Physician-Brief Progress Note Patient Name: Allen Wood DOB: July 07, 1970 MRN: [id number]   Date of Service  01/26/2014  HPI/Events of Note  Confirmed use line  eICU Interventions       Intervention Category Intermediate Interventions: Diagnostic test evaluation  Raylene Miyamoto. 01/26/2014, 11:01 PM

## 2014-01-26 NOTE — Progress Notes (Signed)
[facility] left with patient at 1815. Patient stable at time of transfer. Report called to RN at [facility]. Wife made aware of room assignment.

## 2014-01-26 NOTE — Progress Notes (Signed)
[facility] Progress Note Patient Name: Allen Wood DOB: Jun 10, 1970 MRN: [id number]   Date of Service  01/26/2014  HPI/Events of Note  bnot active bleeding Hope volume off can increase HVCT  eICU Interventions  Hold prbc, cbc repeat     Intervention Category Intermediate Interventions: Diagnostic test evaluation  Raylene Miyamoto. 01/26/2014, 7:25 PM

## 2014-01-27 ENCOUNTER — Inpatient Hospital Stay (HOSPITAL_COMMUNITY): Payer: Medicaid Other

## 2014-01-27 DIAGNOSIS — G9340 Encephalopathy, unspecified: Secondary | ICD-10-CM

## 2014-01-27 LAB — RENAL FUNCTION PANEL
Albumin: 1.2 g/dL — ABNORMAL LOW (ref 3.5–5.2)
Albumin: 1.3 g/dL — ABNORMAL LOW (ref 3.5–5.2)
Anion gap: 7 (ref 5–15)
Anion gap: 10 (ref 5–15)
BUN: 50 mg/dL — ABNORMAL HIGH (ref 6–23)
BUN: 39 mg/dL — ABNORMAL HIGH (ref 6–23)
CHLORIDE: 105 meq/L (ref 96–112)
CO2: 26 mmol/L (ref 19–32)
CO2: 22 mmol/L (ref 19–32)
CREATININE: 1.99 mg/dL — AB (ref 0.50–1.35)
CREATININE: 1.74 mg/dL — AB (ref 0.50–1.35)
Calcium: 7.7 mg/dL — ABNORMAL LOW (ref 8.4–10.5)
Calcium: 6.9 mg/dL — ABNORMAL LOW (ref 8.4–10.5)
Chloride: 107 mEq/L (ref 96–112)
GFR calc Af Amer: 53 mL/min — ABNORMAL LOW (ref >90)
GFR calc non Af Amer: 39 mL/min — ABNORMAL LOW (ref >90)
GFR calc non Af Amer: 46 mL/min — ABNORMAL LOW (ref >90)
GFR, EST AFRICAN AMERICAN: 45 mL/min — AB (ref >90)
Glucose, Bld: 99 mg/dL (ref 70–99)
Glucose, Bld: 108 mg/dL — ABNORMAL HIGH (ref 70–99)
PHOSPHORUS: 5.9 mg/dL — AB (ref 2.3–4.6)
Phosphorus: 7.9 mg/dL — ABNORMAL HIGH (ref 2.3–4.6)
Potassium: 4.2 mmol/L (ref 3.5–5.1)
Potassium: 4.4 mmol/L (ref 3.5–5.1)
Sodium: 138 mmol/L (ref 135–145)
Sodium: 139 mmol/L (ref 135–145)

## 2014-01-27 LAB — CBC WITH DIFFERENTIAL/PLATELET
BASOS ABS: 0.1 10*3/uL (ref 0.0–0.1)
Basophils Relative: 1 % (ref 0–1)
EOS ABS: 0.1 10*3/uL (ref 0.0–0.7)
EOS PCT: 1 % (ref 0–5)
HEMATOCRIT: 19.9 % — AB (ref 39.0–52.0)
HEMOGLOBIN: 6.4 g/dL — AB (ref 13.0–17.0)
LYMPHS ABS: 1.4 10*3/uL (ref 0.7–4.0)
LYMPHS PCT: 14 % (ref 12–46)
MCH: 34.6 pg — ABNORMAL HIGH (ref 26.0–34.0)
MCHC: 32.2 g/dL (ref 30.0–36.0)
MCV: 107.6 fL — ABNORMAL HIGH (ref 78.0–100.0)
MONO ABS: 1.2 10*3/uL — AB (ref 0.1–1.0)
MONOS PCT: 12 % (ref 3–12)
NEUTROS ABS: 7.5 10*3/uL (ref 1.7–7.7)
Neutrophils Relative %: 72 % (ref 43–77)
Platelets: 796 10*3/uL — ABNORMAL HIGH (ref 150–400)
RBC: 1.85 MIL/uL — ABNORMAL LOW (ref 4.22–5.81)
RDW: 13.5 % (ref 11.5–15.5)
WBC: 10.3 10*3/uL (ref 4.0–10.5)

## 2014-01-27 LAB — BASIC METABOLIC PANEL
ANION GAP: 10 (ref 5–15)
BUN: 50 mg/dL — ABNORMAL HIGH (ref 6–23)
CO2: 24 mmol/L (ref 19–32)
Calcium: 7.6 mg/dL — ABNORMAL LOW (ref 8.4–10.5)
Chloride: 104 mEq/L (ref 96–112)
Creatinine, Ser: 1.98 mg/dL — ABNORMAL HIGH (ref 0.50–1.35)
GFR, EST AFRICAN AMERICAN: 45 mL/min — AB (ref >90)
GFR, EST NON AFRICAN AMERICAN: 39 mL/min — AB (ref >90)
Glucose, Bld: 99 mg/dL (ref 70–99)
Potassium: 4.2 mmol/L (ref 3.5–5.1)
Sodium: 138 mmol/L (ref 135–145)

## 2014-01-27 LAB — PHOSPHORUS
PHOSPHORUS: 7.9 mg/dL — AB (ref 2.3–4.6)
Phosphorus: 5.8 mg/dL — ABNORMAL HIGH (ref 2.3–4.6)

## 2014-01-27 LAB — MAGNESIUM
Magnesium: 2.1 mg/dL (ref 1.5–2.5)
Magnesium: 2.1 mg/dL (ref 1.5–2.5)

## 2014-01-27 LAB — GLUCOSE, CAPILLARY
GLUCOSE-CAPILLARY: 95 mg/dL (ref 70–99)
GLUCOSE-CAPILLARY: 89 mg/dL (ref 70–99)
Glucose-Capillary: 90 mg/dL (ref 70–99)
Glucose-Capillary: 90 mg/dL (ref 70–99)
Glucose-Capillary: 91 mg/dL (ref 70–99)
Glucose-Capillary: 98 mg/dL (ref 70–99)

## 2014-01-27 LAB — OCCULT BLOOD X 1 CARD TO LAB, STOOL: Fecal Occult Bld: POSITIVE — AB

## 2014-01-27 LAB — PREPARE RBC (CROSSMATCH)

## 2014-01-27 MED ORDER — SODIUM CHLORIDE 0.9 % IV SOLN: Freq: Once | INTRAVENOUS | Status: DC

## 2014-01-27 MED ORDER — VITAL AF 1.2 CAL PO LIQD
1000.0000 mL | ORAL | Status: DC
  Administered 2014-01-28 – 2014-01-31 (×5): 1000 mL
  Filled 2014-01-27 (×12): qty 1000

## 2014-01-27 NOTE — Progress Notes (Signed)
[facility] Progress Note   Subjective: tolerating -100 cc/ hr w/o BP drop, remains off pressors; 2.1L in and 1.4 L out yest.  UOP 333 cc  Filed Vitals:   01/27/14 0600 01/27/14 0700 01/27/14 0741 01/27/14 0800  BP: 86/54 83/49 91/52    Pulse: 78 79 118   Temp:  97 F (36.1 C)  98.6 F (37 C)  TempSrc:    Oral  Resp: 16 16 35   Height:      Weight:      SpO2: 100% 100% 100%    Exam: On vent, opens eyes, doesn't follow most commands Sclera anicteric, throat w ETT tube NO jvd Chest w bilat coarse rhonchi RRR no MRG noted Abd doughy edema of abd wall, no gross ascites or mass GU with foley draining small amts of yellowish urine Ext 2-3+ diffuse edema all extremities Neuro is awake, lethargic  UA (undersigned 12/31) > pH 5.0, SG 1.030, trace prot, 1+ Hb, neg LE/nit, 10-15 nondysmorphic rbc's/ hpf, 5-10 WBC's/hpf, 5-10 TEC's/hpf, abundant starch granules, 2+ granular casts, no RBC/ WBC casts CXR 01/25/14 > persistent bilat pulm infiltrates R > L and bilat effusions Renal US normal kidneys Urine Na 34, Ucreat 258, FeNa 0.18%   Assessment: 1. Acute renal failure - in setting of strep pneumoniae; rec'd course of IV abx then deteriorated again w new fevers and shock on pressors.  Sediment c/w ATN.  Suspect ATN , less likely AIN; have stopped PPI and Vanc. Low FeNa, normal EF on echo, could have diast HF physiology from vol excess, not sure.   2. Volume overload - tolerating -100/hr so far 3. Strep PNA 4. Shock, on pressors 5. Hx etoh abuse 6. Hx depression / anxiety  Plan- cont CRRT, cont to  increase UF rate if BP remains stable    Allen Splinter MD  pager [phone]    cell [phone]  01/27/2014, 9:45 AM     Recent Labs Lab 01/26/14 0405 01/26/14 2022 01/27/14 0539 01/27/14 0549  NA 140  --  138 138  K 4.3  --  4.2 4.2  CL 104  --  104 105  CO2 26  --  24 26  GLUCOSE 104*  --  99 99  BUN 56*  --  50* 50*  CREATININE 2.22*  --  1.98* 1.99*  CALCIUM  7.8*  --  7.6* 7.7*  PHOS 8.6* 9.0* 7.9* 7.9*    Recent Labs Lab 01/23/14 1230 01/25/14 0540 01/27/14 0549  AST  --  23  --   ALT  --  20  --   ALKPHOS  --  58  --   BILITOT  --  <0.1*  --   PROT 4.6* 4.8*  --   ALBUMIN  --  1.4* 1.2*    Recent Labs Lab 01/26/14 0405 01/26/14 1935 01/27/14 0539  WBC 15.8* 14.4* 10.3  NEUTROABS  --   --  PENDING  HGB 7.0* 7.1* 6.4*  HCT 21.4* 21.1* 19.9*  MCV 108.1* 105.5* 107.6*  PLT 858* 843* 796*   . antiseptic oral rinse  7 mL Mouth Rinse QID  . chlorhexidine  15 mL Mouth Rinse BID  . enoxaparin (LOVENOX) injection  30 mg Subcutaneous Q24H  . feeding supplement (PRO-STAT SUGAR FREE 64)  30 mL Per Tube BID  . feeding supplement (VITAL HIGH PROTEIN)  1,000 mL Per Tube Q24H  . folic acid  1 mg Oral Daily  . guaiFENesin  20 mL Per Tube 6 times  per day  . ipratropium  0.5 mg Nebulization Q6H  . levalbuterol  0.63 mg Nebulization Q6H  . LORazepam  3 mg Per Tube TID  . multivitamin  5 mL Per Tube Daily  . nicotine  14 mg Transdermal Daily  . pantoprazole (PROTONIX) IV  40 mg Intravenous Q24H  . piperacillin-tazobactam  3.375 g Intravenous 4 times per day  . thiamine  100 mg Oral Daily   Or  . thiamine  100 mg Intravenous Daily   . sodium chloride 10 mL/hr at 01/26/14 2012  . dexmedetomidine 0.7 mcg/kg/hr (01/27/14 0700)  . feeding supplement (VITAL AF 1.2 CAL) 1,000 mL (01/26/14 0000)  . fentaNYL infusion INTRAVENOUS 75 mcg/hr (01/27/14 0700)  . midazolam (VERSED) infusion 4 mg/hr (01/27/14 0240)  . norepinephrine (LEVOPHED) Adult infusion Stopped (01/25/14 1915)  . dialysis replacement fluid (prismasate) 200 mL/hr at 01/26/14 2308  . dialysis replacement fluid (prismasate) 200 mL/hr at 01/26/14 2305  . dialysate (PRISMASATE) 1,500 mL/hr at 01/27/14 0714   acetaminophen (TYLENOL) oral liquid 160 mg/5 mL, bisacodyl, docusate **OR** docusate, fentaNYL, heparin, heparin, labetalol, levalbuterol, LORazepam, midazolam

## 2014-01-27 NOTE — Progress Notes (Signed)
NUTRITION FOLLOW UP  Intervention:   Continue TF via OGT with Vital AF 1.2 at 25 ml/h and Prostat 30 ml BID on day 1; on day 2, increase to goal rate of 75 ml/h (1800 ml per day) to provide 2160 kcals, 135 gm protein, 1460 ml free water daily.  Tube feeding regimen provides 2160 kcal (91% of needs), 135 grams of protein, and 1460 ml of H2O.   -RD to continue to monitor  Nutrition Dx:   Inadequate oral intake related to inability to eat as evidenced by NPO; ongoing  Goal:   Pt to meet >/= 90% of their estimated nutrition needs; not met  Monitor:   Weight trend, NPO, vent status/settings, labs, TF initiation and tolerance  Assessment:   44 y/o M, smoker / ETOH abuse, admitted 12/21 with ongoing fatigue, cough with productive yellow sputum, R posterior back pain, chills & sweats. Work up found patient to have RLL PNA with positive strep antigen. Developed worsening respiratory failure 12/22   Patient is currently intubated on ventilator support MV: 16 L/min Temp (24hrs), Avg:99.3 F (37.4 C), Min:95.3 F (35.2 C), Max:102 F (38.9 C)  Pt was transferred to [facility] on 01/26/13 for CRRT. RD re-consulted for TF management. Pt is currently receiving Vital High Protein @ 25 ml/hr. Prior to transfer pt was receiving Vital AF 1.2 @ 75 ml/hr. Pt's weight has gone up 25 lbs.   Labs: low calcium, high phosphorus, low GFR, elevated BUN, low albumin, low hemoglobin  Height: Ht Readings from Last 1 Encounters:  01/26/14 5' 11"  (1.803 m)    Weight Status:   Wt Readings from Last 1 Encounters:  01/27/14 192 lb 14.4 oz (87.5 kg)  01/20/14 167 lb (76 kg)  Re-estimated needs:  Kcal: 2380 Protein: 110-130 g Fluid: Per MD  Skin: Intact; +3 generalized edema  Diet Order: Diet NPO time specified   Intake/Output Summary (Last 24 hours) at 01/27/14 0927 Last data filed at 01/27/14 0800  Gross per 24 hour  Intake 1890.27 ml  Output   1452 ml  Net 438.27 ml    Last BM:  1/1   Labs:   Recent Labs Lab 01/26/14 0405 01/26/14 2022 01/27/14 0539 01/27/14 0549  NA 140  --  138 138  K 4.3  --  4.2 4.2  CL 104  --  104 105  CO2 26  --  24 26  BUN 56*  --  50* 50*  CREATININE 2.22*  --  1.98* 1.99*  CALCIUM 7.8*  --  7.6* 7.7*  MG 2.1 2.0 2.1  --   PHOS 8.6* 9.0* 7.9* 7.9*  GLUCOSE 104*  --  99 99    CBG (last 3)   Recent Labs  01/26/14 2351 01/27/14 0444 01/27/14 0803  GLUCAP 90 95 98    Scheduled Meds: . antiseptic oral rinse  7 mL Mouth Rinse QID  . chlorhexidine  15 mL Mouth Rinse BID  . enoxaparin (LOVENOX) injection  30 mg Subcutaneous Q24H  . feeding supplement (PRO-STAT SUGAR FREE 64)  30 mL Per Tube BID  . feeding supplement (VITAL HIGH PROTEIN)  1,000 mL Per Tube Q24H  . folic acid  1 mg Oral Daily  . guaiFENesin  20 mL Per Tube 6 times per day  . ipratropium  0.5 mg Nebulization Q6H  . levalbuterol  0.63 mg Nebulization Q6H  . LORazepam  3 mg Per Tube TID  . multivitamin  5 mL Per Tube Daily  . nicotine  14 mg Transdermal Daily  . pantoprazole (PROTONIX) IV  40 mg Intravenous Q24H  . piperacillin-tazobactam  3.375 g Intravenous 4 times per day  . thiamine  100 mg Oral Daily   Or  . thiamine  100 mg Intravenous Daily    Continuous Infusions: . sodium chloride 10 mL/hr at 01/26/14 2012  . dexmedetomidine 0.7 mcg/kg/hr (01/27/14 0700)  . feeding supplement (VITAL AF 1.2 CAL) 1,000 mL (01/26/14 0000)  . fentaNYL infusion INTRAVENOUS 75 mcg/hr (01/27/14 0700)  . midazolam (VERSED) infusion 4 mg/hr (01/27/14 0240)  . norepinephrine (LEVOPHED) Adult infusion Stopped (01/25/14 1915)  . dialysis replacement fluid (prismasate) 200 mL/hr at 01/26/14 2308  . dialysis replacement fluid (prismasate) 200 mL/hr at 01/26/14 2305  . dialysate (PRISMASATE) 1,500 mL/hr at 01/27/14 0714   Pryor Ochoa RD, LDN Inpatient Clinical Dietitian Pager: [phone] After Hours Pager: [phone]

## 2014-01-27 NOTE — Progress Notes (Signed)
PULMONARY / CRITICAL CARE MEDICINE   Name: Scot Shiraishi MRN: [id number] DOB: 1970/12/02    ADMISSION DATE:  01/15/2014 CONSULTATION DATE:  01/16/14  REFERRING MD :  Dr. Grandville Silos   CHIEF COMPLAINT:  Acute Respiratory Failure   INITIAL PRESENTATION: 44 y/o M, smoker / ETOH abuse, admitted 12/21 with ongoing fatigue, cough with productive yellow sputum, R posterior back pain, chills & sweats.  Work up found patient to have RLL PNA with positive strep antigen.  Developed worsening respiratory failure 12/22  STUDIES:  12/24 EEG >> neg 1/1 Echo >EF 60%, no wall abn,   SIGNIFICANT EVENTS: 12/21  Admit with RLL PNA 12/22  Decompensated, required intubation for respiratory failure in setting of PNA 12/23  Stable on vent, increased pleural effusion on CXR.  Levo @ 2 mcg, Fent 131mcg, Precedex 0.6.  Seizures overnight, Tmax 105 12/24  Concern for ileus with TF's coming out of mouth overnight.  Reglan dosing 12/26  Reintubated due to inability to clear secretions 12/30 Cr rising, UOP dropping. 12/31 Cr up to 1.9, UOP 250 ml over 24 hours, renal consult called. 1/1 transfused 1 U pRBC;  1/1 Transferred to [facility] , CRRT per renal   SUBJECTIVE: Febrile 1/2 >Transferred to [facility] 1/1 for CRRT Tolerating CRRT 100cc/h Following commands, agitated    VITAL SIGNS: Temp:  [95.3 F (35.2 C)-102 F (38.9 C)] 98.6 F (37 C) (01/02 0800) Pulse Rate:  [78-118] 118 (01/02 0741) Resp:  [13-35] 35 (01/02 0741) BP: (81-149)/(44-82) 91/52 mmHg (01/02 0741) SpO2:  [92 %-100 %] 100 % (01/02 0741) FiO2 (%):  [40 %-60 %] 60 % (01/02 0741) Weight:  [87.3 kg (192 lb 7.4 oz)-87.5 kg (192 lb 14.4 oz)] 87.5 kg (192 lb 14.4 oz) (01/02 0500)   HEMODYNAMICS: CVP:  [8 mmHg-16 mmHg] 10 mmHg   VENTILATOR SETTINGS: Vent Mode:  [-] PRVC FiO2 (%):  [40 %-60 %] 60 % Set Rate:  [16 bmp] 16 bmp Vt Set:  [600 mL] 600 mL PEEP:  [5 cmH20] 5 cmH20 Pressure Support:  [10 cmH20] 10 cmH20 Plateau Pressure:  [16 cmH20-32  cmH20] 19 cmH20   INTAKE / OUTPUT:  Intake/Output Summary (Last 24 hours) at 01/27/14 1108 Last data filed at 01/27/14 1000  Gross per 24 hour  Intake 1670.67 ml  Output   1615 ml  Net  55.67 ml   PHYSICAL EXAMINATION: General: sedated on vent , alert /f/c  HEENT: NCAT, EOMi PULM: diminished BS bilaterally ,  crackles bilaterally CV: RRR,ST  no mgr Ab: BS+, soft, nontender Ext: anasarca noted throughout Neuro: sedated on vent, intermittently agitated  LABS:  CBC  Recent Labs Lab 01/26/14 0405 01/26/14 1935 01/27/14 0539  WBC 15.8* 14.4* 10.3  HGB 7.0* 7.1* 6.4*  HCT 21.4* 21.1* 19.9*  PLT 858* 843* 796*   BMET  Recent Labs Lab 01/26/14 0405 01/27/14 0539 01/27/14 0549  NA 140 138 138  K 4.3 4.2 4.2  CL 104 104 105  CO2 26 24 26   BUN 56* 50* 50*  CREATININE 2.22* 1.98* 1.99*  GLUCOSE 104* 99 99   Electrolytes  Recent Labs Lab 01/26/14 0405 01/26/14 2022 01/27/14 0539 01/27/14 0549  CALCIUM 7.8*  --  7.6* 7.7*  MG 2.1 2.0 2.1  --   PHOS 8.6* 9.0* 7.9* 7.9*   Sepsis Markers No results for input(s): LATICACIDVEN, PROCALCITON, O2SATVEN in the last 168 hours. ABG  Recent Labs Lab 01/24/14 0418 01/25/14 0307 01/26/14 0433  PHART 7.483* 7.375 7.378  PCO2ART 36.4 44.1  41.3  PO2ART 63.6* 79.9* 73.0*   Liver Enzymes  Recent Labs Lab 01/25/14 0540 01/27/14 0549  AST 23  --   ALT 20  --   ALKPHOS 58  --   BILITOT <0.1*  --   ALBUMIN 1.4* 1.2*   Glucose  Recent Labs Lab 01/26/14 1153 01/26/14 1624 01/26/14 1940 01/26/14 2351 01/27/14 0444 01/27/14 0803  GLUCAP 105* 98 82 90 95 98    Imaging Dg Chest Port 1 View  01/26/2014   CLINICAL DATA:  Dialysis catheter insertion.  EXAM: PORTABLE CHEST - 1 VIEW  COMPARISON:  01/26/2014  FINDINGS: Endotracheal tube placed with tip measuring 5.5 cm above the carinal. Enteric tube tip is off the field of view but below the left hemidiaphragm. Right central venous catheter with tip over the low  SVC region. New left-sided central venous catheter with tip over the cavoatrial junction. No pneumothorax. Normal heart size. Bilateral pleural effusions, greater on the right. Atelectasis or infiltration in both lung bases.  IMPRESSION: Appliances appear to be in satisfactory location. No pneumothorax. Bilateral pleural effusions with atelectasis or infiltration in the lung bases, greater on the right.   Electronically Signed   By: Lucienne Capers M.D.   On: 01/26/2014 22:59   Dg Chest Port 1 View  01/26/2014   CLINICAL DATA:  Infiltrates, effusions, ET tube position.  EXAM: PORTABLE CHEST - 1 VIEW  COMPARISON:  01/25/2014  FINDINGS: Support devices including endotracheal tube are in stable position. Heart is upper limits normal in size. Bilateral airspace opacities are again noted, right greater than left with small to moderate layering bilateral effusions, right greater than left. No significant change since prior study.  IMPRESSION: No significant change in the bilateral airspace disease and effusions, both right greater than left.   Electronically Signed   By: Rolm Baptise M.D.   On: 01/26/2014 07:29   ASSESSMENT / PLAN:  PULMONARY OETT 12/22 >> 12/26>>12/26>>> A: Acute Hypoxic Respiratory Failure - in setting of RLL Strep pneumonia CAP.   R Pleural Effusion - 12/29 thora culture negative.   Likely pulmonary edema in setting of renal failure Tobacco Abuse P:   - Maintain full support; PSV trials daily as tolerated - daily WUA - Xopenex/Atrovent Q6 + Q3 PRN xopenex. - Abx as below.  CARDIOVASCULAR CVL L IJ TLC 12/22 >> 12/30, R IJ TLC 12/30>>> A:  Severe Sepsis - in setting of PNA.  Required pressors 12/23, off 12/24 Tachycardia  Echo 1/1 showed preserved EF    P:  - Monitor. - Follow CVP. -add SCD    RENAL A:   AKI > likely ATN vs AIN  Hypocalcemia P:   - Trend BMP. - Replace electrolytes as indicated. - CRRT per renal   GASTROINTESTINAL A:   Protein Calorie  Malnutrition - albumin 2.0 Constipation noted. Etoh by history but no evidence of hepatic dysfunction for the time being. P:   - TF as ordered. - PPI.  HEMATOLOGIC A:   Macrocytic Anemia - in setting of ETOH abuse 1/2 hbg tr down , no sign of active bleeding ,  P:  - Thiamine / Folate / MVI. - transfuse 1 U PRBC 1/2 - Trend CBC. - Lovenox for DVT prophylaxis. -check stool for occult blood , if positive , will need to d/c lovenox   INFECTIOUS A:   RLL PNA - strep antigen positive  1/2 >temp and wbc tr down  P:   - BCx2 12/21 >>strep pneumo sens - UC  12/21 >> ng - Sputum 12/21 >> moderate strep pneumoniae - Blood 12/30>>> - Urine 12/30>>> - Sputum 12/30>>> - Pleural fluid culture 12/29>>>  - Abx: Rocephin, start date 12/21>>>12/30. - Abx: Azithro, 12/21>>12/25 - Vancomycin 12/30>>>12/31 - Zosyn 12/30>>>  - Tylenol given high temps / seizures, Q6 PRN  - monitor closely off of Vanc, continue zosyn for 5 days considering possible HCAP  ENDOCRINE A:   No acute issues  P:   - Monitor glucose on BMP  NEUROLOGIC A:   ETOH Abuse - at risk withdrawal Seizures - x2 overnight 12/23, fever up to 105 +/- ETOH withdrawal Anxiety / Depression  Concern for wernicke's per neuro. P:   - RASS goal: -1 - Continue PO ativan at 2 mg PO TID. - Fentanyl gtt for pain. - Precedex gtt  - High dose thiamine per neuro  - continue versed gtt   FAMILY  - Updates: Updated wife at bedside 1/2   Nathania Waldman NP-C  [facility]  [phone]    01/27/2014, 11:08 AM

## 2014-01-28 ENCOUNTER — Inpatient Hospital Stay (HOSPITAL_COMMUNITY): Payer: Medicaid Other

## 2014-01-28 DIAGNOSIS — G9340 Encephalopathy, unspecified: Secondary | ICD-10-CM

## 2014-01-28 LAB — RENAL FUNCTION PANEL
ANION GAP: 12 (ref 5–15)
Albumin: 1.3 g/dL — ABNORMAL LOW (ref 3.5–5.2)
Albumin: 1.3 g/dL — ABNORMAL LOW (ref 3.5–5.2)
Anion gap: 6 (ref 5–15)
BUN: 31 mg/dL — ABNORMAL HIGH (ref 6–23)
BUN: 24 mg/dL — ABNORMAL HIGH (ref 6–23)
CALCIUM: 7.1 mg/dL — AB (ref 8.4–10.5)
CO2: 20 mmol/L (ref 19–32)
CO2: 25 mmol/L (ref 19–32)
CREATININE: 1.54 mg/dL — AB (ref 0.50–1.35)
Calcium: 7.5 mg/dL — ABNORMAL LOW (ref 8.4–10.5)
Chloride: 103 mEq/L (ref 96–112)
Chloride: 102 mEq/L (ref 96–112)
Creatinine, Ser: 1.55 mg/dL — ABNORMAL HIGH (ref 0.50–1.35)
GFR calc Af Amer: 61 mL/min — ABNORMAL LOW (ref >90)
GFR calc Af Amer: 61 mL/min — ABNORMAL LOW (ref >90)
GFR, EST NON AFRICAN AMERICAN: 52 mL/min — AB (ref >90)
GFR, EST NON AFRICAN AMERICAN: 53 mL/min — AB (ref >90)
GLUCOSE: 113 mg/dL — AB (ref 70–99)
Glucose, Bld: 101 mg/dL — ABNORMAL HIGH (ref 70–99)
POTASSIUM: 4 mmol/L (ref 3.5–5.1)
Phosphorus: 5 mg/dL — ABNORMAL HIGH (ref 2.3–4.6)
Phosphorus: 4.1 mg/dL (ref 2.3–4.6)
Potassium: 4 mmol/L (ref 3.5–5.1)
SODIUM: 135 mmol/L (ref 135–145)
Sodium: 133 mmol/L — ABNORMAL LOW (ref 135–145)

## 2014-01-28 LAB — CULTURE, RESPIRATORY

## 2014-01-28 LAB — CBC
HCT: 24.5 % — ABNORMAL LOW (ref 39.0–52.0)
Hemoglobin: 8 g/dL — ABNORMAL LOW (ref 13.0–17.0)
MCH: 33.2 pg (ref 26.0–34.0)
MCHC: 32.7 g/dL (ref 30.0–36.0)
MCV: 101.7 fL — AB (ref 78.0–100.0)
Platelets: 753 10*3/uL — ABNORMAL HIGH (ref 150–400)
RBC: 2.41 MIL/uL — AB (ref 4.22–5.81)
RDW: 14.8 % (ref 11.5–15.5)
WBC: 9.7 10*3/uL (ref 4.0–10.5)

## 2014-01-28 LAB — MAGNESIUM
MAGNESIUM: 2.1 mg/dL (ref 1.5–2.5)
MAGNESIUM: 2.2 mg/dL (ref 1.5–2.5)

## 2014-01-28 LAB — GLUCOSE, CAPILLARY
GLUCOSE-CAPILLARY: 114 mg/dL — AB (ref 70–99)
GLUCOSE-CAPILLARY: 83 mg/dL (ref 70–99)
GLUCOSE-CAPILLARY: 88 mg/dL (ref 70–99)
GLUCOSE-CAPILLARY: 92 mg/dL (ref 70–99)
GLUCOSE-CAPILLARY: 99 mg/dL (ref 70–99)
Glucose-Capillary: 93 mg/dL (ref 70–99)

## 2014-01-28 LAB — BODY FLUID CULTURE: Culture: NO GROWTH

## 2014-01-28 LAB — TYPE AND SCREEN
ABO/RH(D): O POS
Antibody Screen: NEGATIVE
UNIT DIVISION: 0

## 2014-01-28 LAB — URINE CULTURE
COLONY COUNT: NO GROWTH
Culture: NO GROWTH

## 2014-01-28 LAB — PHOSPHORUS: PHOSPHORUS: 4 mg/dL (ref 2.3–4.6)

## 2014-01-28 LAB — APTT: aPTT: 35 seconds (ref 24–37)

## 2014-01-28 LAB — CULTURE, RESPIRATORY W GRAM STAIN

## 2014-01-28 NOTE — Progress Notes (Signed)
[facility] Progress Note   Subjective: no pressors , I/O 2.7 L in and 6.0 L out yest for - 3.6L.  UOP 260 last 24 hours.  BP 90's - 110's, HR 80's. FiO2 0.40 and stable.  CXR today shows slight improvement in CHF.   Filed Vitals:   01/28/14 0630 01/28/14 0700 01/28/14 0736 01/28/14 0740  BP: 81/56 126/58 87/54   Pulse: 81 84 80   Temp: 97 F (36.1 C) 96.9 F (36.1 C)  97.5 F (36.4 C)  TempSrc:  Core (Comment)  Oral  Resp: 16 14 16    Height:      Weight:      SpO2: 99% 100% 100%    Exam: On vent, opens eyes, doesn't follow most commands Sclera anicteric, throat w ETT tube NO jvd Chest w bilat coarse rhonchi RRR no MRG noted Abd doughy edema of abd wall, no gross ascites or mass GU with foley draining small amts of yellowish urine Ext 2-3+ diffuse edema all extremities Neuro is awake, lethargic  UA (undersigned 12/31) > pH 5.0, SG 1.030, trace prot, 1+ Hb, neg LE/nit, 10-15 nondysmorphic rbc's/ hpf, 5-10 WBC's/hpf, 5-10 TEC's/hpf, abundant starch granules, 2+ granular casts, no RBC/ WBC casts Renal US normal kidneys Urine Na 34, Ucreat 258, FeNa 0.18% CXR 1/3 improving CHF   Assessment: 1. Acute renal failure - sediment c/w ATN.  Suspect ATN , less likely AIN; have stopped PPI and Vanc. Low FeNa, normal EF on echo, could have diast HF physiology from vol excess. Making some urine 250 cc /day.  CRRT D#3. CHF improving with vol removal. 2. Volume overload - tolerating UF, continue, improving, still up 8-12 kg from admit 3. Strep PNA / VDRF 4. Shock - resolved , off pressors 5. Hx etoh abuse 6. Hx depression / anxiety  Plan- cont CRRT, cont UF, no heparin    Kelly Splinter MD  pager [phone]    cell [phone]  01/28/2014, 9:11 AM     Recent Labs Lab 01/27/14 0549 01/27/14 1743 01/27/14 1749 01/28/14 0418  NA 138 139  --  135  K 4.2 4.4  --  4.0  CL 105 107  --  103  CO2 26 22  --  20  GLUCOSE 99 108*  --  101*  BUN 50* 39*  --  31*   CREATININE 1.99* 1.74*  --  1.55*  CALCIUM 7.7* 6.9*  --  7.1*  PHOS 7.9* 5.9* 5.8* 5.0*    Recent Labs Lab 01/23/14 1230  01/25/14 0540 01/27/14 0549 01/27/14 1743 01/28/14 0418  AST  --   --  23  --   --   --   ALT  --   --  20  --   --   --   ALKPHOS  --   --  58  --   --   --   BILITOT  --   --  <0.1*  --   --   --   PROT 4.6*  --  4.8*  --   --   --   ALBUMIN  --   < > 1.4* 1.2* 1.3* 1.3*  < > = values in this interval not displayed.  Recent Labs Lab 01/26/14 1935 01/27/14 0539 01/28/14 0418  WBC 14.4* 10.3 9.7  NEUTROABS  --  7.5  --   HGB 7.1* 6.4* 8.0*  HCT 21.1* 19.9* 24.5*  MCV 105.5* 107.6* 101.7*  PLT 843* 796* 753*   . sodium chloride  Intravenous Once  . antiseptic oral rinse  7 mL Mouth Rinse QID  . chlorhexidine  15 mL Mouth Rinse BID  . enoxaparin (LOVENOX) injection  30 mg Subcutaneous Q24H  . folic acid  1 mg Oral Daily  . guaiFENesin  20 mL Per Tube 6 times per day  . ipratropium  0.5 mg Nebulization Q6H  . levalbuterol  0.63 mg Nebulization Q6H  . LORazepam  3 mg Per Tube TID  . multivitamin  5 mL Per Tube Daily  . nicotine  14 mg Transdermal Daily  . pantoprazole (PROTONIX) IV  40 mg Intravenous Q24H  . piperacillin-tazobactam  3.375 g Intravenous 4 times per day  . thiamine  100 mg Oral Daily   Or  . thiamine  100 mg Intravenous Daily   . sodium chloride 10 mL/hr at 01/28/14 0355  . dexmedetomidine 1 mcg/kg/hr (01/28/14 0630)  . feeding supplement (VITAL AF 1.2 CAL) 1,000 mL (01/28/14 0616)  . fentaNYL infusion INTRAVENOUS 120 mcg/hr (01/28/14 0630)  . midazolam (VERSED) infusion 6 mg/hr (01/28/14 0630)  . dialysis replacement fluid (prismasate) 200 mL/hr at 01/28/14 0151  . dialysis replacement fluid (prismasate) 200 mL/hr at 01/28/14 0233  . dialysate (PRISMASATE) 1,500 mL/hr at 01/28/14 0505   acetaminophen (TYLENOL) oral liquid 160 mg/5 mL, bisacodyl, docusate **OR** docusate, fentaNYL, heparin, heparin, labetalol, levalbuterol,  LORazepam, midazolam

## 2014-01-28 NOTE — Progress Notes (Signed)
PULMONARY / CRITICAL CARE MEDICINE   Name: Allen Wood MRN: [id number] DOB: Apr 02, 1970    ADMISSION DATE:  01/15/2014 CONSULTATION DATE:  01/16/14  REFERRING MD :  Dr. Grandville Silos   CHIEF COMPLAINT:  Acute Respiratory Failure   INITIAL PRESENTATION: 44 y/o M, smoker / ETOH abuse, admitted 12/21 with ongoing fatigue, cough with productive yellow sputum, R posterior back pain, chills & sweats.  Work up found patient to have RLL PNA with positive strep antigen.  Developed worsening respiratory failure 12/22  STUDIES:  12/24 EEG >> neg 1/1 Echo >EF 60%, no wall abn,   SIGNIFICANT EVENTS: 12/21  Admit with RLL PNA 12/22  Decompensated, required intubation for respiratory failure in setting of PNA 12/23  Stable on vent, increased pleural effusion on CXR.  Levo @ 2 mcg, Fent 111mcg, Precedex 0.6.  Seizures overnight, Tmax 105 12/24  Concern for ileus with TF's coming out of mouth overnight.  Reglan dosing 12/26  Reintubated due to inability to clear secretions 12/30 Cr rising, UOP dropping. 12/31 Cr up to 1.9, UOP 250 ml over 24 hours, renal consult called. 1/1 transfused 1 U pRBC;  1/1 Transferred to [facility] , CRRT per renal   SUBJECTIVE: Febrile 1/3 >Tolerating CRRT 150cc/h, -4L I/O ,  w/ less pitting edema  UOP remains low, scr tr down  Following commands, agitated  hbg improved after transfusion     VITAL SIGNS: Temp:  [88.9 F (31.6 C)-98.4 F (36.9 C)] 98.4 F (36.9 C) (01/03 1528) Pulse Rate:  [68-128] 85 (01/03 1400) Resp:  [14-34] 16 (01/03 1400) BP: (77-126)/(41-91) 91/59 mmHg (01/03 1400) SpO2:  [97 %-100 %] 100 % (01/03 1531) FiO2 (%):  [40 %] 40 % (01/03 1531) Weight:  [82.8 kg (182 lb 8.7 oz)] 82.8 kg (182 lb 8.7 oz) (01/03 0400)   HEMODYNAMICS: CVP:  [9 mmHg] 9 mmHg   VENTILATOR SETTINGS: Vent Mode:  [-] PRVC FiO2 (%):  [40 %] 40 % Set Rate:  [16 bmp] 16 bmp Vt Set:  [600 mL] 600 mL PEEP:  [5 cmH20] 5 cmH20 Plateau Pressure:  [17 cmH20-18 cmH20] 17 cmH20    INTAKE / OUTPUT:  Intake/Output Summary (Last 24 hours) at 01/28/14 1558 Last data filed at 01/28/14 1500  Gross per 24 hour  Intake 2883.2 ml  Output   8167 ml  Net -5283.8 ml   PHYSICAL EXAMINATION: General: sedated on vent , alert /f/c  HEENT: NCAT, EOMi PULM: diminished BS bilaterally ,  crackles bilaterally CV: RRR,ST  no mgr Ab: BS+, soft, nontender Ext: anasarca slightly better  Neuro: sedated on vent, intermittently agitated  LABS:  CBC  Recent Labs Lab 01/26/14 1935 01/27/14 0539 01/28/14 0418  WBC 14.4* 10.3 9.7  HGB 7.1* 6.4* 8.0*  HCT 21.1* 19.9* 24.5*  PLT 843* 796* 753*   BMET  Recent Labs Lab 01/27/14 0549 01/27/14 1743 01/28/14 0418  NA 138 139 135  K 4.2 4.4 4.0  CL 105 107 103  CO2 26 22 20   BUN 50* 39* 31*  CREATININE 1.99* 1.74* 1.55*  GLUCOSE 99 108* 101*   Electrolytes  Recent Labs Lab 01/27/14 0539 01/27/14 0549 01/27/14 1743 01/27/14 1749 01/28/14 0418  CALCIUM 7.6* 7.7* 6.9*  --  7.1*  MG 2.1  --   --  2.1 2.1  PHOS 7.9* 7.9* 5.9* 5.8* 5.0*   Sepsis Markers No results for input(s): LATICACIDVEN, PROCALCITON, O2SATVEN in the last 168 hours. ABG  Recent Labs Lab 01/24/14 0418 01/25/14 0307 01/26/14 3220  PHART 7.483* 7.375 7.378  PCO2ART 36.4 44.1 41.3  PO2ART 63.6* 79.9* 73.0*   Liver Enzymes  Recent Labs Lab 01/25/14 0540 01/27/14 0549 01/27/14 1743 01/28/14 0418  AST 23  --   --   --   ALT 20  --   --   --   ALKPHOS 58  --   --   --   BILITOT <0.1*  --   --   --   ALBUMIN 1.4* 1.2* 1.3* 1.3*   Glucose  Recent Labs Lab 01/27/14 1935 01/27/14 2339 01/28/14 0430 01/28/14 0700 01/28/14 1106 01/28/14 1452  GLUCAP 89 83 93 88 99 92    Imaging Dg Chest Port 1 View  01/27/2014   CLINICAL DATA:  Acute respiratory failure with hypoxia.  EXAM: PORTABLE CHEST - 1 VIEW  COMPARISON:  01/26/2014.  FINDINGS: Support apparatus: Endotracheal tube tip is 39 mm from the carina. Enteric tube is present, with  unchanged position. LEFT IJ vas cath. The tip is nearly obscured on today's exam but appears unchanged. RIGHT subclavian central line is present with the tip in the mid SVC. Monitoring leads project over the chest.  Cardiomediastinal Silhouette: Partially obscured but grossly unchanged.  Lungs: Worsening bilateral airspace disease, with some shift in the airspace disease in the RIGHT lung. Basilar atelectasis. No pneumothorax.  Effusions:  RIGHT-greater-than-LEFT bilateral small effusions.  Other:  None.  IMPRESSION: 1. Stable support apparatus. 2. Worsening pulmonary aeration with increased asymmetric RIGHT-greater-than-LEFT airspace disease likely representing pulmonary edema. Small effusions and basilar atelectasis.   Electronically Signed   By: Dereck Ligas M.D.   On: 01/27/2014 08:23   ASSESSMENT / PLAN:  PULMONARY OETT 12/22 >> 12/26>>12/26>>> A: Acute Hypoxic Respiratory Failure - in setting of RLL Strep pneumonia CAP.   R Pleural Effusion - 12/29 thora culture negative.   Likely pulmonary edema in setting of renal failure Tobacco Abuse P:   - Maintain full support; PSV trials daily as tolerated - daily WUA - Xopenex/Atrovent Q6 + Q3 PRN xopenex. - Abx as below.  CARDIOVASCULAR CVL L IJ TLC 12/22 >> 12/30, R IJ TLC 12/30>>> A:  Severe Sepsis - in setting of PNA.  Required pressors 12/23, off 12/24 Tachycardia  Echo 1/1 showed preserved EF    P:  - Monitor. - Follow CVP. - SCD /lovenox    RENAL A:   AKI > likely ATN vs AIN  Hypocalcemia P:   - Trend BMP. - Replace electrolytes as indicated. - CRRT per renal   GASTROINTESTINAL A:   Protein Calorie Malnutrition - albumin 2.0 Constipation noted. Etoh by history but no evidence of hepatic dysfunction for the time being. P:   - TF as ordered. - PPI.  HEMATOLOGIC A:   Macrocytic Anemia - in setting of ETOH abuse Tranx 1 U PRBC 1/2 /stool positive occult >no sign of active bleeding  P:  - Thiamine / Folate /  MVI. - Trend CBC. - d/c lovenox , cont SCD    INFECTIOUS A:   RLL PNA - strep antigen positive  1/2 >temp and wbc tr down  P:   - BCx2 12/21 >>strep pneumo sens - UC  12/21 >> ng - Sputum 12/21 >> moderate strep pneumoniae - Blood 12/30>>> - Urine 12/30>>>NEG  - Sputum 12/30>>>NEG  - Pleural fluid culture 12/29>>>NEG   - Abx: Rocephin, start date 12/21>>>12/30. - Abx: Azithro, 12/21>>12/25 - Vancomycin 12/30>>>12/31 - Zosyn 12/30>>>  - Tylenol given high temps / seizures, Q6 PRN  - monitor  closely off of Vanc, continue zosyn for 5 days considering possible HCAP  ENDOCRINE A:   No acute issues  P:   - Monitor glucose on BMP  NEUROLOGIC A:   ETOH Abuse - at risk withdrawal Seizures - x2 overnight 12/23, fever up to 105 +/- ETOH withdrawal Anxiety / Depression  Concern for wernicke's per neuro. P:   - RASS goal: -1 - Continue PO ativan at 2 mg PO TID. - Fentanyl gtt for pain. - Precedex gtt  - High dose thiamine per neuro  - continue versed gtt   FAMILY  - Updates: Updated wife at bedside 1/2   Telena Peyser NP-C  [facility]  [phone]    01/28/2014, 3:58 PM

## 2014-01-29 ENCOUNTER — Inpatient Hospital Stay (HOSPITAL_COMMUNITY): Payer: Medicaid Other

## 2014-01-29 DIAGNOSIS — R0603 Acute respiratory distress: Secondary | ICD-10-CM | POA: Insufficient documentation

## 2014-01-29 DIAGNOSIS — J80 Acute respiratory distress syndrome: Secondary | ICD-10-CM

## 2014-01-29 LAB — RENAL FUNCTION PANEL
ALBUMIN: 1.5 g/dL — AB (ref 3.5–5.2)
Albumin: 1.3 g/dL — ABNORMAL LOW (ref 3.5–5.2)
Anion gap: 6 (ref 5–15)
Anion gap: 5 (ref 5–15)
BUN: 19 mg/dL (ref 6–23)
BUN: 14 mg/dL (ref 6–23)
CO2: 26 mmol/L (ref 19–32)
CO2: 27 mmol/L (ref 19–32)
Calcium: 7.6 mg/dL — ABNORMAL LOW (ref 8.4–10.5)
Calcium: 7.6 mg/dL — ABNORMAL LOW (ref 8.4–10.5)
Chloride: 105 mEq/L (ref 96–112)
Chloride: 104 mEq/L (ref 96–112)
Creatinine, Ser: 1.48 mg/dL — ABNORMAL HIGH (ref 0.50–1.35)
Creatinine, Ser: 1.37 mg/dL — ABNORMAL HIGH (ref 0.50–1.35)
GFR calc Af Amer: 64 mL/min — ABNORMAL LOW (ref >90)
GFR calc non Af Amer: 61 mL/min — ABNORMAL LOW (ref >90)
GFR, EST AFRICAN AMERICAN: 71 mL/min — AB (ref >90)
GFR, EST NON AFRICAN AMERICAN: 56 mL/min — AB (ref >90)
GLUCOSE: 119 mg/dL — AB (ref 70–99)
Glucose, Bld: 110 mg/dL — ABNORMAL HIGH (ref 70–99)
PHOSPHORUS: 2.7 mg/dL (ref 2.3–4.6)
POTASSIUM: 3.9 mmol/L (ref 3.5–5.1)
Phosphorus: 3.2 mg/dL (ref 2.3–4.6)
Potassium: 3.9 mmol/L (ref 3.5–5.1)
SODIUM: 136 mmol/L (ref 135–145)
Sodium: 137 mmol/L (ref 135–145)

## 2014-01-29 LAB — GLUCOSE, CAPILLARY
GLUCOSE-CAPILLARY: 97 mg/dL (ref 70–99)
Glucose-Capillary: 100 mg/dL — ABNORMAL HIGH (ref 70–99)
Glucose-Capillary: 103 mg/dL — ABNORMAL HIGH (ref 70–99)
Glucose-Capillary: 103 mg/dL — ABNORMAL HIGH (ref 70–99)
Glucose-Capillary: 104 mg/dL — ABNORMAL HIGH (ref 70–99)
Glucose-Capillary: 99 mg/dL (ref 70–99)

## 2014-01-29 LAB — BLOOD GAS, ARTERIAL
Acid-Base Excess: 1.5 mmol/L (ref 0.0–2.0)
BICARBONATE: 25.4 meq/L — AB (ref 20.0–24.0)
DRAWN BY: 39866
FIO2: 0.4 %
MECHVT: 600 mL
O2 SAT: 96.8 %
PEEP: 5 cmH2O
Patient temperature: 98.2
RATE: 16 resp/min
TCO2: 26.6 mmol/L (ref 0–100)
pCO2 arterial: 38.3 mmHg (ref 35.0–45.0)
pH, Arterial: 7.436 (ref 7.350–7.450)
pO2, Arterial: 92.9 mmHg (ref 80.0–100.0)

## 2014-01-29 LAB — CBC
HCT: 22.8 % — ABNORMAL LOW (ref 39.0–52.0)
Hemoglobin: 7.4 g/dL — ABNORMAL LOW (ref 13.0–17.0)
MCH: 32.9 pg (ref 26.0–34.0)
MCHC: 32.5 g/dL (ref 30.0–36.0)
MCV: 101.3 fL — ABNORMAL HIGH (ref 78.0–100.0)
PLATELETS: 757 10*3/uL — AB (ref 150–400)
RBC: 2.25 MIL/uL — ABNORMAL LOW (ref 4.22–5.81)
RDW: 14.3 % (ref 11.5–15.5)
WBC: 10.8 10*3/uL — ABNORMAL HIGH (ref 4.0–10.5)

## 2014-01-29 LAB — BASIC METABOLIC PANEL
Anion gap: 7 (ref 5–15)
BUN: 19 mg/dL (ref 6–23)
CHLORIDE: 105 meq/L (ref 96–112)
CO2: 25 mmol/L (ref 19–32)
CREATININE: 1.46 mg/dL — AB (ref 0.50–1.35)
Calcium: 7.6 mg/dL — ABNORMAL LOW (ref 8.4–10.5)
GFR, EST AFRICAN AMERICAN: 65 mL/min — AB (ref >90)
GFR, EST NON AFRICAN AMERICAN: 56 mL/min — AB (ref >90)
Glucose, Bld: 109 mg/dL — ABNORMAL HIGH (ref 70–99)
POTASSIUM: 3.9 mmol/L (ref 3.5–5.1)
Sodium: 137 mmol/L (ref 135–145)

## 2014-01-29 LAB — MAGNESIUM: MAGNESIUM: 2.1 mg/dL (ref 1.5–2.5)

## 2014-01-29 LAB — APTT: aPTT: 40 seconds — ABNORMAL HIGH (ref 24–37)

## 2014-01-29 LAB — PROTIME-INR
INR: 1.21 (ref 0.00–1.49)
PROTHROMBIN TIME: 15.5 s — AB (ref 11.6–15.2)

## 2014-01-29 MED ORDER — FENTANYL CITRATE 0.05 MG/ML IJ SOLN: 200.0000 ug | Freq: Once | INTRAMUSCULAR | Status: DC

## 2014-01-29 MED ORDER — RISPERIDONE 1 MG/ML PO SOLN
1.0000 mg | Freq: Two times a day (BID) | ORAL | Status: DC | PRN
  Administered 2014-01-29: 1 mg via ORAL
  Filled 2014-01-29 (×2): qty 1

## 2014-01-29 MED ORDER — MIDAZOLAM HCL 2 MG/2ML IJ SOLN: 4.0000 mg | Freq: Once | INTRAMUSCULAR | Status: DC

## 2014-01-29 MED ORDER — VECURONIUM BROMIDE 10 MG IV SOLR
10.0000 mg | Freq: Once | INTRAVENOUS | Status: AC
  Administered 2014-01-30: 10 mg via INTRAVENOUS

## 2014-01-29 MED ORDER — PROPOFOL 10 MG/ML IV EMUL
5.0000 ug/kg/min | Freq: Once | INTRAVENOUS | Status: AC
Start: 2014-01-29 — End: 2014-01-30
  Administered 2014-01-30: 60 ug/kg/min via INTRAVENOUS
  Filled 2014-01-29: qty 100

## 2014-01-29 MED ORDER — ETOMIDATE 2 MG/ML IV SOLN
40.0000 mg | Freq: Once | INTRAVENOUS | Status: AC
  Administered 2014-01-30: 40 mg via INTRAVENOUS
  Filled 2014-01-29: qty 20

## 2014-01-29 MED ORDER — PANTOPRAZOLE SODIUM 40 MG IV SOLR
40.0000 mg | Freq: Two times a day (BID) | INTRAVENOUS | Status: DC
  Administered 2014-01-29 – 2014-01-30 (×3): 40 mg via INTRAVENOUS
  Filled 2014-01-29 (×5): qty 40

## 2014-01-29 NOTE — Progress Notes (Signed)
Placed back in full support due to inc RR 40's

## 2014-01-29 NOTE — Progress Notes (Addendum)
PULMONARY / CRITICAL CARE MEDICINE   Name: Allen Wood MRN: [id number] DOB: 01-Nov-1970    ADMISSION DATE:  01/15/2014 CONSULTATION DATE:  01/16/14  REFERRING MD :  Dr. Grandville Silos   CHIEF COMPLAINT:  Acute Respiratory Failure   INITIAL PRESENTATION: 44 y/o M, smoker / ETOH abuse, admitted 12/21 with ongoing fatigue, cough with productive yellow sputum, R posterior back pain, chills & sweats.  Work up found patient to have RLL PNA with positive strep antigen.  Developed worsening respiratory failure 12/22  STUDIES:  12/24 EEG >> neg 1/1 Echo >EF 60%, no wall abn,   SIGNIFICANT EVENTS: 12/21  Admit with RLL PNA 12/22  Decompensated, required intubation for respiratory failure in setting of PNA 12/23  Stable on vent, increased pleural effusion on CXR.  Levo @ 2 mcg, Fent 130mcg, Precedex 0.6.  Seizures overnight, Tmax 105 12/24  Concern for ileus with TF's coming out of mouth overnight.  Reglan dosing 12/26  Reintubated due to inability to clear secretions 12/30 Cr rising, UOP dropping. 12/31 Cr up to 1.9, UOP 250 ml over 24 hours, renal consult called. 1/1 transfused 1 U pRBC;  1/1 Transferred to [facility] , CRRT per renal  1/2 Transfused 1 U pRBC  SUBJECTIVE: Febrile 1/2 >Transferred to [facility] 1/1 for CRRT Tolerating CRRT 100cc/h Following commands, agitated    VITAL SIGNS: Temp:  [96.1 F (35.6 C)-98.7 F (37.1 C)] 98.2 F (36.8 C) (01/04 0349) Pulse Rate:  [74-128] 74 (01/04 0600) Resp:  [14-34] 16 (01/04 0600) BP: (77-126)/(41-91) 85/57 mmHg (01/04 0600) SpO2:  [97 %-100 %] 100 % (01/04 0600) FiO2 (%):  [40 %] 40 % (01/04 0600) Weight:  [77.9 kg (171 lb 11.8 oz)] 77.9 kg (171 lb 11.8 oz) (01/04 0400)   HEMODYNAMICS: CVP:  [5 mmHg-6 mmHg] 5 mmHg   VENTILATOR SETTINGS: Vent Mode:  [-] PRVC FiO2 (%):  [40 %] 40 % Set Rate:  [16 bmp] 16 bmp Vt Set:  [600 mL] 600 mL PEEP:  [5 cmH20] 5 cmH20 Plateau Pressure:  [16 cmH20-18 cmH20] 17 cmH20   INTAKE /  OUTPUT:  Intake/Output Summary (Last 24 hours) at 01/29/14 0648 Last data filed at 01/29/14 0600  Gross per 24 hour  Intake 3530.73 ml  Output   7043 ml  Net -3512.27 ml   PHYSICAL EXAMINATION: General: sedated on vent , alert, follows commands HEENT: NCAT, EOMI PULM: diminished BS bilaterally, crackles bilaterally CV: RRR, ST  no mgr Ab: BS+, soft, nontender Ext: anasarca noted throughout Neuro: sedated on vent, intermittently agitated  LABS:  CBC  Recent Labs Lab 01/27/14 0539 01/28/14 0418 01/29/14 0350  WBC 10.3 9.7 10.8*  HGB 6.4* 8.0* 7.4*  HCT 19.9* 24.5* 22.8*  PLT 796* 753* 757*   BMET  Recent Labs Lab 01/28/14 0418 01/28/14 1650 01/29/14 0350  NA 135 133* 137  137  K 4.0 4.0 3.9  3.9  CL 103 102 105  105  CO2 20 25 25  26   BUN 31* 24* 19  19  CREATININE 1.55* 1.54* 1.46*  1.48*  GLUCOSE 101* 113* 109*  110*   Electrolytes  Recent Labs Lab 01/28/14 0418 01/28/14 1650 01/29/14 0350  CALCIUM 7.1* 7.5* 7.6*  7.6*  MG 2.1 2.2 2.1  PHOS 5.0* 4.1  4.0 3.2   Sepsis Markers No results for input(s): LATICACIDVEN, PROCALCITON, O2SATVEN in the last 168 hours. ABG  Recent Labs Lab 01/25/14 0307 01/26/14 0433 01/29/14 0500  PHART 7.375 7.378 7.436  PCO2ART 44.1 41.3 38.3  PO2ART 79.9* 73.0* 92.9   Liver Enzymes  Recent Labs Lab 01/25/14 0540  01/28/14 0418 01/28/14 1650 01/29/14 0350  AST 23  --   --   --   --   ALT 20  --   --   --   --   ALKPHOS 58  --   --   --   --   BILITOT <0.1*  --   --   --   --   ALBUMIN 1.4*  < > 1.3* 1.3* 1.3*  < > = values in this interval not displayed. Glucose  Recent Labs Lab 01/28/14 0700 01/28/14 1106 01/28/14 1452 01/28/14 2003 01/29/14 0030 01/29/14 0348  GLUCAP 88 99 92 114* 103* 100*    Imaging Dg Chest Port 1 View  01/28/2014   CLINICAL DATA:  Followup of pneumonia.  EXAM: PORTABLE CHEST - 1 VIEW  COMPARISON:  1 day prior  FINDINGS: Support apparatus: Endotracheal tube 7.3 cm  above carina. Nasogastric tube extends beyond the inferior aspect of the film. Left internal jugular line tip at low SVC. Right PICC line tip at mid SVC. Numerous leads and wires project over the chest.  Cardiomediastinal silhouette: Normal heart size.  Pleura: Layering right greater than left pleural effusions are similar. Possible mild loculation involving the right-sided effusion. No pneumothorax.  Lungs: Improvement in mild congestive heart failure, asymmetric and greater on the right. Lower lobe predominant airspace disease is also greater on the right and not significantly changed.  Other: None.  IMPRESSION: Slight improvement in congestive heart failure.  Pleural fluid and airspace disease/atelectasis is similar and greater on the right.   Electronically Signed   By: Abigail Miyamoto M.D.   On: 01/28/2014 07:47   ASSESSMENT / PLAN:  PULMONARY OETT 12/22 >> 12/26>>12/26>>> A: Acute Hypoxic Respiratory Failure - in setting of RLL Strep pneumonia CAP.   R Pleural Effusion - 12/29 thora culture negative.   Likely pulmonary edema in setting of renal failure Tobacco Abuse P:   - Maintain full support; PSV trials daily as tolerated -continued neg balance required, has success last 24 hr - daily WUA - Xopenex/Atrovent Q6 + Q3 PRN xopenex. - Abx as below. -confusion is major issue for extubation -failed wean yet again today  - combo day 14 days and delirium = trach planned   CARDIOVASCULAR CVL L IJ TLC 12/22 >> 12/30, R IJ TLC 12/30>>> A:  Severe Sepsis - in setting of PNA.  Required pressors 12/23, off 12/24 Tachycardia  Echo 1/1 showed preserved EF    P:  - dc cvp, gross  Overload - continued neg balance - Continue SCD  RENAL A:   AKI > likely ATN vs AIN  Hypocalcemia anasarca P:   - Trend BMP. - Replace electrolytes as indicated. - CRRT per renal  -neg balance goals daily  GASTROINTESTINAL A:   Protein Calorie Malnutrition - albumin 2.0 Constipation noted. Etoh by history  but no evidence of hepatic dysfunction for the time being. P:   - TF as ordered. - PPI to bid -may need egd, follow clinica course with ppi increase  HEMATOLOGIC A:   Macrocytic Anemia - in setting of ETOH abuse 1/2 hbg tr down , no sign of active bleeding  1/4 Hbg tr down again, FOBT+ on 1/2 but no frank bleeding P:  - Thiamine / Folate / MVI. - Trend CBC. - Consider transfusing if Hbg < 7.0 - Lovenox d/c'd 1/3, on SCDs for DVT prophylaxis --repeat coags  INFECTIOUS A:   RLL PNA - strep antigen positive  1/2 >temp and wbc tr down  P:   - BCx2 12/21 >>strep pneumo sens - UC  12/21 >> ng - Sputum 12/21 >> moderate strep pneumoniae - Blood 12/30>>> - Urine 12/30>>> - Sputum 12/30>>> - Pleural fluid culture 12/29>>>  - Abx: Rocephin, start date 12/21>>>12/30. - Abx: Azithro, 12/21>>12/25 - Vancomycin 12/30>>>12/31 - Zosyn 12/30>>>  -clinically without fever, pcxr c/w volume - dc abx  Day 15  ENDOCRINE A:   No acute issues  P:   - Monitor glucose on BMP  NEUROLOGIC A:   ETOH Abuse - at risk withdrawal Seizures - x2 overnight 12/23, fever up to 105 +/- ETOH withdrawal Anxiety / Depression  Concern for wernicke's per neuro. P:   - RASS goal: -1 - Continue PO ativan at 2 mg PO TID. - Fentanyl gtt for pain. - Precedex gtt, change cap to 2.4 - High dose thiamine per neuro  - Continue versed gtt -add oral rispirdal -may consider addition haldol q4h -ativan required -trach would help reduce sedation  FAMILY  - Updates: Updated wife at bedside 1/2    Albin Felling, MD Internal Medicine Resident, PGY-1    STAFF NOTE: I, Merrie Roof, MD FACP have personally reviewed patient's available data, including medical history, events of note, physical examination and test results as part of my evaluation. I have discussed with resident/NP and other care providers such as pharmacist, RN and RRT. In addition, I personally evaluated patient and elicited key findings  of: day 14 ett, severe agitation, coarse BS, trach planned, escalate precedex max, add Risperdal, PS failed 10, neg balance remains, get coags The patient is critically ill with multiple organ systems failure and requires high complexity decision making for assessment and support, frequent evaluation and titration of therapies, application of advanced monitoring technologies and extensive interpretation of multiple databases.   Critical Care Time devoted to patient care services described in this note is35 Minutes. This time reflects time of care of this signee: Merrie Roof, MD FACP. This critical care time does not reflect procedure time, or teaching time or supervisory time of PA/NP/Med student/Med Resident etc but could involve care discussion time. Rest per NP/medical resident whose note is outlined above and that I agree with   Lavon Paganini. Titus Mould, MD, [place] Pgr: [facility] 01/29/2014 10:20 AM

## 2014-01-29 NOTE — Progress Notes (Signed)
Patient ID: Allen Wood, male   DOB: Jul 22, 1970, 44 y.o.   MRN: [id number]  [facility] Progress Note    Assessment/ Plan:   1. AKI: likely from ATN of sepsis associated with strep PNA. Some UOP but remains oliguric- continue CRRT at this time  (may transition to IHD as he is no longer pressor dependent). No changes to CRRT prescription. Tolerating UF 2. Strep PNA/VDRF: plans noted for possible tracheostomy anticipating chronic wean 3. H/O EtOH abuse 4. H/O depression   Subjective:   No acute events overnight   Objective:   BP 112/76 mmHg  Pulse 103  Temp(Src) 97.7 F (36.5 C) (Oral)  Resp 24  Ht 5\' 11"  (1.803 m)  Wt 77.9 kg (171 lb 11.8 oz)  BMI 23.96 kg/m2  SpO2 100%  Intake/Output Summary (Last 24 hours) at 01/29/14 0843 Last data filed at 01/29/14 0800  Gross per 24 hour  Intake 3356.03 ml  Output   6981 ml  Net -3624.97 ml   Weight change: -4.9 kg (-10 lb 12.8 oz)  Physical Exam: GMW:[id number], sedated YQI:HKVQQ regular tachycardia, normal s1 and s2 Resp:CTA bilaterally, no rales/rhonchi VZD:GLOV, flat, NT, BS normal Ext:No LE edema  Imaging: Dg Chest Port 1 View  01/29/2014   CLINICAL DATA:  44 year old male with shortness of Breath, community acquired pneumonia, sepsis, tachycardia. Initial encounter.  EXAM: PORTABLE CHEST - 1 VIEW  COMPARISON:  01/28/2014 and earlier.  FINDINGS: Portable AP semi upright view at 0437 hrs. Stable endotracheal tube, tip at the level the clavicles. Left IJ approach dual lumen catheter has not significantly changed in position. Right subclavian approach single lumen central line is stable. Enteric tube courses to the abdomen as before, tip not included.  No pneumothorax. Bilateral veiling pulmonary opacity compatible with pleural effusions and superimposed bilateral lower lobe collapse or consolidation. Stable mild vascular congestion without overt edema. Overall ventilation has not changed.  IMPRESSION: 1.  Stable  lines and tubes. 2. Stable ventilation with bilateral pleural effusions and lower lobe collapse/consolidation.   Electronically Signed   By: Lars Pinks M.D.   On: 01/29/2014 07:25   Dg Chest Port 1 View  01/28/2014   CLINICAL DATA:  Followup of pneumonia.  EXAM: PORTABLE CHEST - 1 VIEW  COMPARISON:  1 day prior  FINDINGS: Support apparatus: Endotracheal tube 7.3 cm above carina. Nasogastric tube extends beyond the inferior aspect of the film. Left internal jugular line tip at low SVC. Right PICC line tip at mid SVC. Numerous leads and wires project over the chest.  Cardiomediastinal silhouette: Normal heart size.  Pleura: Layering right greater than left pleural effusions are similar. Possible mild loculation involving the right-sided effusion. No pneumothorax.  Lungs: Improvement in mild congestive heart failure, asymmetric and greater on the right. Lower lobe predominant airspace disease is also greater on the right and not significantly changed.  Other: None.  IMPRESSION: Slight improvement in congestive heart failure.  Pleural fluid and airspace disease/atelectasis is similar and greater on the right.   Electronically Signed   By: Abigail Miyamoto M.D.   On: 01/28/2014 07:47    Labs: BMET  Recent Labs Lab 01/26/14 0405  01/27/14 0539 01/27/14 0549 01/27/14 1743 01/27/14 1749 01/28/14 0418 01/28/14 1650 01/29/14 0350  NA 140  --  138 138 139  --  135 133* 137  137  K 4.3  --  4.2 4.2 4.4  --  4.0 4.0 3.9  3.9  CL 104  --  104 105 107  --  103 102 105  105  CO2 26  --  24 26 22   --  20 25 25  26   GLUCOSE 104*  --  99 99 108*  --  101* 113* 109*  110*  BUN 56*  --  50* 50* 39*  --  31* 24* 19  19  CREATININE 2.22*  --  1.98* 1.99* 1.74*  --  1.55* 1.54* 1.46*  1.48*  CALCIUM 7.8*  --  7.6* 7.7* 6.9*  --  7.1* 7.5* 7.6*  7.6*  PHOS 8.6*  < > 7.9* 7.9* 5.9* 5.8* 5.0* 4.1  4.0 3.2  < > = values in this interval not displayed. CBC  Recent Labs Lab 01/26/14 1935 01/27/14 0539  01/28/14 0418 01/29/14 0350  WBC 14.4* 10.3 9.7 10.8*  NEUTROABS  --  7.5  --   --   HGB 7.1* 6.4* 8.0* 7.4*  HCT 21.1* 19.9* 24.5* 22.8*  MCV 105.5* 107.6* 101.7* 101.3*  PLT 843* 796* 753* 757*    Medications:    . sodium chloride   Intravenous Once  . antiseptic oral rinse  7 mL Mouth Rinse QID  . chlorhexidine  15 mL Mouth Rinse BID  . folic acid  1 mg Oral Daily  . guaiFENesin  20 mL Per Tube 6 times per day  . ipratropium  0.5 mg Nebulization Q6H  . levalbuterol  0.63 mg Nebulization Q6H  . LORazepam  3 mg Per Tube TID  . multivitamin  5 mL Per Tube Daily  . pantoprazole (PROTONIX) IV  40 mg Intravenous Q24H  . piperacillin-tazobactam  3.375 g Intravenous 4 times per day  . thiamine  100 mg Oral Daily   Or  . thiamine  100 mg Intravenous Daily   Elmarie Shiley, MD 01/29/2014, 8:43 AM

## 2014-01-30 ENCOUNTER — Inpatient Hospital Stay (HOSPITAL_COMMUNITY): Payer: Medicaid Other

## 2014-01-30 ENCOUNTER — Encounter (HOSPITAL_COMMUNITY): Payer: Self-pay | Admitting: Internal Medicine

## 2014-01-30 DIAGNOSIS — R140 Abdominal distension (gaseous): Secondary | ICD-10-CM | POA: Insufficient documentation

## 2014-01-30 HISTORY — PX: TRACHEOSTOMY: SUR1362

## 2014-01-30 LAB — RENAL FUNCTION PANEL
ALBUMIN: 1.4 g/dL — AB (ref 3.5–5.2)
ANION GAP: 5 (ref 5–15)
ANION GAP: 4 — AB (ref 5–15)
Albumin: 1.5 g/dL — ABNORMAL LOW (ref 3.5–5.2)
BUN: 10 mg/dL (ref 6–23)
BUN: 12 mg/dL (ref 6–23)
CALCIUM: 7.9 mg/dL — AB (ref 8.4–10.5)
CO2: 28 mmol/L (ref 19–32)
CO2: 27 mmol/L (ref 19–32)
CREATININE: 1.19 mg/dL (ref 0.50–1.35)
Calcium: 7.7 mg/dL — ABNORMAL LOW (ref 8.4–10.5)
Chloride: 104 mEq/L (ref 96–112)
Chloride: 104 mEq/L (ref 96–112)
Creatinine, Ser: 1.53 mg/dL — ABNORMAL HIGH (ref 0.50–1.35)
GFR calc non Af Amer: 72 mL/min — ABNORMAL LOW (ref >90)
GFR, EST AFRICAN AMERICAN: 84 mL/min — AB (ref >90)
GFR, EST AFRICAN AMERICAN: 62 mL/min — AB (ref >90)
GFR, EST NON AFRICAN AMERICAN: 53 mL/min — AB (ref >90)
GLUCOSE: 107 mg/dL — AB (ref 70–99)
Glucose, Bld: 127 mg/dL — ABNORMAL HIGH (ref 70–99)
PHOSPHORUS: 2.9 mg/dL (ref 2.3–4.6)
POTASSIUM: 4.3 mmol/L (ref 3.5–5.1)
Phosphorus: 3 mg/dL (ref 2.3–4.6)
Potassium: 3.6 mmol/L (ref 3.5–5.1)
SODIUM: 137 mmol/L (ref 135–145)
Sodium: 135 mmol/L (ref 135–145)

## 2014-01-30 LAB — TYPE AND SCREEN
ABO/RH(D): O POS
Antibody Screen: NEGATIVE
UNIT DIVISION: 0

## 2014-01-30 LAB — MAGNESIUM: Magnesium: 2.2 mg/dL (ref 1.5–2.5)

## 2014-01-30 LAB — CBC
HCT: 23.9 % — ABNORMAL LOW (ref 39.0–52.0)
Hemoglobin: 7.8 g/dL — ABNORMAL LOW (ref 13.0–17.0)
MCH: 33.2 pg (ref 26.0–34.0)
MCHC: 32.6 g/dL (ref 30.0–36.0)
MCV: 101.7 fL — AB (ref 78.0–100.0)
PLATELETS: 767 10*3/uL — AB (ref 150–400)
RBC: 2.35 MIL/uL — ABNORMAL LOW (ref 4.22–5.81)
RDW: 14.1 % (ref 11.5–15.5)
WBC: 10.9 10*3/uL — ABNORMAL HIGH (ref 4.0–10.5)

## 2014-01-30 LAB — APTT: APTT: 39 s — AB (ref 24–37)

## 2014-01-30 LAB — CULTURE, BLOOD (ROUTINE X 2)
Culture: NO GROWTH
Culture: NO GROWTH

## 2014-01-30 LAB — HIV ANTIBODY (ROUTINE TESTING W REFLEX): HIV 1&2 Ab, 4th Generation: NONREACTIVE

## 2014-01-30 LAB — GLUCOSE, CAPILLARY
GLUCOSE-CAPILLARY: 100 mg/dL — AB (ref 70–99)
GLUCOSE-CAPILLARY: 110 mg/dL — AB (ref 70–99)
GLUCOSE-CAPILLARY: 115 mg/dL — AB (ref 70–99)
Glucose-Capillary: 100 mg/dL — ABNORMAL HIGH (ref 70–99)
Glucose-Capillary: 95 mg/dL (ref 70–99)

## 2014-01-30 MED ORDER — IPRATROPIUM-ALBUTEROL 0.5-2.5 (3) MG/3ML IN SOLN
3.0000 mL | Freq: Four times a day (QID) | RESPIRATORY_TRACT | Status: DC
  Administered 2014-01-30 – 2014-02-08 (×37): 3 mL via RESPIRATORY_TRACT
  Filled 2014-01-30 (×37): qty 3

## 2014-01-30 MED ORDER — HALOPERIDOL LACTATE 5 MG/ML IJ SOLN
5.0000 mg | Freq: Four times a day (QID) | INTRAMUSCULAR | Status: DC | PRN
  Administered 2014-01-30 – 2014-02-02 (×4): 5 mg via INTRAVENOUS
  Filled 2014-01-30 (×4): qty 1

## 2014-01-30 MED ORDER — SODIUM CHLORIDE 0.9 % IV BOLUS (SEPSIS)
250.0000 mL | Freq: Once | INTRAVENOUS | Status: AC
  Administered 2014-01-30: 250 mL via INTRAVENOUS

## 2014-01-30 MED ORDER — RISPERIDONE 1 MG/ML PO SOLN
2.0000 mg | Freq: Two times a day (BID) | ORAL | Status: DC
  Administered 2014-01-30 – 2014-02-02 (×8): 2 mg via ORAL
  Filled 2014-01-30 (×10): qty 2

## 2014-01-30 MED ORDER — HALOPERIDOL 2 MG PO TABS
4.0000 mg | ORAL_TABLET | Freq: Four times a day (QID) | ORAL | Status: DC | PRN
  Filled 2014-01-30: qty 2

## 2014-01-30 MED ORDER — HYDROMORPHONE HCL 1 MG/ML IJ SOLN
1.0000 mg | Freq: Once | INTRAMUSCULAR | Status: AC
  Administered 2014-01-30: 1 mg via INTRAVENOUS
  Filled 2014-01-30: qty 1

## 2014-01-30 MED ORDER — METOPROLOL TARTRATE 1 MG/ML IV SOLN
5.0000 mg | Freq: Once | INTRAVENOUS | Status: AC
  Administered 2014-01-30: 5 mg via INTRAVENOUS

## 2014-01-30 MED ORDER — METOPROLOL TARTRATE 1 MG/ML IV SOLN
INTRAVENOUS | Status: AC
  Filled 2014-01-30: qty 5

## 2014-01-30 MED ORDER — PROPOFOL 10 MG/ML IV EMUL
INTRAVENOUS | Status: AC
  Filled 2014-01-30: qty 100

## 2014-01-30 MED ORDER — DEXMEDETOMIDINE HCL IN NACL 400 MCG/100ML IV SOLN
0.4000 ug/kg/h | INTRAVENOUS | Status: DC
Start: 2014-01-30 — End: 2014-02-03
  Administered 2014-01-30: 2 ug/kg/h via INTRAVENOUS
  Administered 2014-01-30: 2.4 ug/kg/h via INTRAVENOUS
  Administered 2014-01-30: 1.4 ug/kg/h via INTRAVENOUS
  Administered 2014-01-30: 0.6 ug/kg/h via INTRAVENOUS
  Administered 2014-01-30: 0.7 ug/kg/h via INTRAVENOUS
  Administered 2014-01-31: 0.4 ug/kg/h via INTRAVENOUS
  Administered 2014-01-31: 1.2 ug/kg/h via INTRAVENOUS
  Administered 2014-02-01: 1 ug/kg/h via INTRAVENOUS
  Filled 2014-01-30 (×3): qty 100
  Filled 2014-01-30: qty 50
  Filled 2014-01-30 (×7): qty 100
  Filled 2014-01-30: qty 50
  Filled 2014-01-30: qty 100

## 2014-01-30 NOTE — Progress Notes (Signed)
Pt's residuals are 700 cc after having tube feedings off for 5 hours. Per Dr. Titus Mould hold tube feedings and reevaluate tomorrow. MD aware of pt's heart rate continuing to stay in the 140s-150s. Verbal order for 1 mg of dilaudid. Will continue to monitor.

## 2014-01-30 NOTE — Progress Notes (Signed)
Patient ID: Allen Wood, male   DOB: 23-Nov-1970, 44 y.o.   MRN: [id number]  [facility] Progress Note    Assessment/ Plan:   1. AKI: likely from ATN of sepsis associated with strep PNA. Some UOP but remains oliguric- will continue CRRT at this time,but decrease rate of UF to 75-139mL/hr to keep fluid even. No evident renal recovery yet. 2. Strep PNA/VDRF: plans noted for possible tracheostomy anticipating chronic wean 3. H/O EtOH abuse 4. H/O depression  Subjective:   No acute events overnight   Objective:   BP 97/63 mmHg  Pulse 93  Temp(Src) 97.6 F (36.4 C) (Oral)  Resp 20  Ht 5\' 11"  (1.803 m)  Wt 75.2 kg (165 lb 12.6 oz)  BMI 23.13 kg/m2  SpO2 97%  Intake/Output Summary (Last 24 hours) at 01/30/14 8182 Last data filed at 01/30/14 0800  Gross per 24 hour  Intake 3508.13 ml  Output   7033 ml  Net -3524.87 ml   Weight change: -2.7 kg (-5 lb 15.2 oz)  Physical Exam: XHB:[id number] on vent/CRRT OFB:PZWCH RRR, normal s1 and s2 Resp:CTA bilaterally, no rales/rhonchi ENI:DPOE, flat, NT, BS normal Ext:No LE edema  Imaging: Dg Chest Port 1 View  01/29/2014   CLINICAL DATA:  44 year old male with shortness of Breath, community acquired pneumonia, sepsis, tachycardia. Initial encounter.  EXAM: PORTABLE CHEST - 1 VIEW  COMPARISON:  01/28/2014 and earlier.  FINDINGS: Portable AP semi upright view at 0437 hrs. Stable endotracheal tube, tip at the level the clavicles. Left IJ approach dual lumen catheter has not significantly changed in position. Right subclavian approach single lumen central line is stable. Enteric tube courses to the abdomen as before, tip not included.  No pneumothorax. Bilateral veiling pulmonary opacity compatible with pleural effusions and superimposed bilateral lower lobe collapse or consolidation. Stable mild vascular congestion without overt edema. Overall ventilation has not changed.  IMPRESSION: 1.  Stable lines and tubes. 2. Stable  ventilation with bilateral pleural effusions and lower lobe collapse/consolidation.   Electronically Signed   By: Lars Pinks M.D.   On: 01/29/2014 07:25    Labs: BMET  Recent Labs Lab 01/27/14 0549 01/27/14 1743 01/27/14 1749 01/28/14 0418 01/28/14 1650 01/29/14 0350 01/29/14 1600 01/30/14 0413  NA 138 139  --  135 133* 137  137 136 137  K 4.2 4.4  --  4.0 4.0 3.9  3.9 3.9 3.6  CL 105 107  --  103 102 105  105 104 104  CO2 26 22  --  20 25 25  26 27 28   GLUCOSE 99 108*  --  101* 113* 109*  110* 119* 127*  BUN 50* 39*  --  31* 24* 19  19 14 10   CREATININE 1.99* 1.74*  --  1.55* 1.54* 1.46*  1.48* 1.37* 1.19  CALCIUM 7.7* 6.9*  --  7.1* 7.5* 7.6*  7.6* 7.6* 7.9*  PHOS 7.9* 5.9* 5.8* 5.0* 4.1  4.0 3.2 2.7 2.9   CBC  Recent Labs Lab 01/26/14 1935 01/27/14 0539 01/28/14 0418 01/29/14 0350  WBC 14.4* 10.3 9.7 10.8*  NEUTROABS  --  7.5  --   --   HGB 7.1* 6.4* 8.0* 7.4*  HCT 21.1* 19.9* 24.5* 22.8*  MCV 105.5* 107.6* 101.7* 101.3*  PLT 843* 796* 753* 757*    Medications:    . sodium chloride   Intravenous Once  . antiseptic oral rinse  7 mL Mouth Rinse QID  . chlorhexidine  15 mL Mouth Rinse BID  .  etomidate  40 mg Intravenous Once  . fentaNYL  200 mcg Intravenous Once  . folic acid  1 mg Oral Daily  . guaiFENesin  20 mL Per Tube 6 times per day  . ipratropium  0.5 mg Nebulization Q6H  . levalbuterol  0.63 mg Nebulization Q6H  . LORazepam  3 mg Per Tube TID  . midazolam  4 mg Intravenous Once  . multivitamin  5 mL Per Tube Daily  . pantoprazole (PROTONIX) IV  40 mg Intravenous Q12H  . propofol  5-80 mcg/kg/min Intravenous Once  . thiamine  100 mg Oral Daily   Or  . thiamine  100 mg Intravenous Daily  . vecuronium  10 mg Intravenous Once    Elmarie Shiley, MD 01/30/2014, 8:29 AM

## 2014-01-30 NOTE — Procedures (Signed)
Name:  [name] MRN:  [id number] DOB:  04-20-1970  OPERATIVE NOTE  Procedure:  Percutaneous tracheostomy.  Indications:  Ventilator-dependent respiratory failure.PNA  Consent:  Procedure, alternatives, risks and benefits discussed with medical POA.  Questions answered.  Consent obtained.  Anesthesia:  Etom, prop, vec, versed, fent.  Procedure summary:  Appropriate equipment was assembled.  The patient was identified as Allen Wood and safety timeout was performed. The patient was placed in supine position with a towel roll behind shoulder blades and neck extended.  Sterile technique was used. The patient's neck and upper chest were prepped using chlorhexidine / alcohol scrub and the field was draped in usual sterile fashion with full body drape. After the adequate sedation / anesthesia was achieved, attention was directed at the midline trachea, where the cricothyroid membrane was palpated. Approximately two fingerbreadths above the sternal notch, a verticle incision was created with a scalpel after local infiltration with 0.2% Lidocaine. Then, using Seldinger technique and a percutaneous tracheostomy set, the trachea was entered with a 14 gauge needle with an overlying sheath. This was all confirmed under direct visualization of a fiberoptic flexible bronchoscope. Entrance into the trachea was identified through the third tracheal ring interspace. Following this, a guidewire was inserted. The needle was removed, leaving the sheath and the guidewire intact. Next, the sheath was removed and a small dilator was inserted. The tracheal rings were then dilated. A #8 Shiley was then opened. The balloon was checked. It was placed over a tracheal dilator, which was then advanced over the guidewire and through the previously dilated tract. The Shiley tracheostomy tube was noted to pass in the trachea with little resistance. The guidewire and dilator tubes were removed from the trachea. An inner  cannula was placed through the tracheostomy tube. The tracheostomy was then secured at the anterior neck with 4 monofilament sutures. The oral endotracheal tube was removed and the ventilator was attached to the newly placed tracheostomy tube. Adequate tidal volumes were noted. The cuff was inflated and no evidence of air leak was noted. No evidence of bleeding was noted. At this point, the procedure was concluded. Post-procedure chest x-ray was ordered.  Complications:  No immediate complications were noted.  Hemodynamic parameters and oxygenation remained stable throughout the procedure.  Estimated blood loss:  Less then 10 mL.  Raylene Miyamoto., MD Pulmonary and [place] Pager: [phone]  01/30/2014, 12:32 PM   Can follow up in trach clinic [phone]

## 2014-01-30 NOTE — Procedures (Signed)
Bronchoscopy Procedure Note Allen Wood [id number] Jun 09, 1970  Procedure: Bronchoscopy Indications: Placement of Tracheostomy   Procedure Details Consent: Risks of procedure as well as the alternatives and risks of each were explained to the (patient/caregiver).  Consent for procedure obtained. Time Out: Verified patient identification, verified procedure, site/side was marked, verified correct patient position, special equipment/implants available, medications/allergies/relevent history reviewed, required imaging and test results available.  Performed  In preparation for procedure, patient was given 100% FiO2 and bronchoscope lubricated. Sedation: See procedure note for medications administered.   Airway entered and the upper airway was examined and within normal limits.  Procedures performed: placement of tracheostomy.  Bronchoscope removed.    Evaluation Hemodynamic Status: BP stable throughout; O2 sats: stable throughout Patient's Current Condition: stable Specimens:  None Complications: No apparent complications Patient did tolerate procedure well.   Procedure performed under direct supervision of Dr. Alva Garnet.  Allen Gens, NP-C [facility] Pgr: [phone] or [phone]  01/30/2014  I was present for and supervised the entire procedure  Merton Border, MD ; [facility] service [place] [phone].  After 5:30 PM or weekends, call [phone]

## 2014-01-30 NOTE — Progress Notes (Signed)
PULMONARY / CRITICAL CARE MEDICINE   Name: Allen Wood MRN: [id number] DOB: Jun 12, 1970    ADMISSION DATE:  01/15/2014 CONSULTATION DATE:  01/16/14  REFERRING MD :  Dr. Grandville Silos   CHIEF COMPLAINT:  Acute Respiratory Failure   INITIAL PRESENTATION: 44 y/o M, smoker / ETOH abuse, admitted 12/21 with ongoing fatigue, cough with productive yellow sputum, R posterior back pain, chills & sweats.  Work up found patient to have RLL PNA with positive strep antigen.  Developed worsening respiratory failure 12/22  STUDIES:  12/24 EEG >> neg 1/1 Echo >EF 60%, no wall abn,   SIGNIFICANT EVENTS: 12/21  Admit with RLL PNA 12/22  Decompensated, required intubation for respiratory failure in setting of PNA 12/23  Stable on vent, increased pleural effusion on CXR.  Levo @ 2 mcg, Fent 163mcg, Precedex 0.6.  Seizures overnight, Tmax 105 12/24  Concern for ileus with TF's coming out of mouth overnight.  Reglan dosing 12/26  Reintubated due to inability to clear secretions 12/30 Cr rising, UOP dropping. 12/31 Cr up to 1.9, UOP 250 ml over 24 hours, renal consult called 1/1 transfused 1 U pRBC;  1/1 Transferred to [facility] , CRRT per renal  1/2 Transfused 1 U pRBC  SUBJECTIVE:  Hypothermia (94.3) this AM Tolerating CRRT 100cc/h Following commands, agitated  Gross ne gbalance   VITAL SIGNS: Temp:  [93.8 F (34.3 C)-97.7 F (36.5 C)] 94.3 F (34.6 C) (01/05 0600) Pulse Rate:  [56-110] 71 (01/05 0600) Resp:  [15-31] 16 (01/05 0600) BP: (81-129)/(54-89) 82/63 mmHg (01/05 0600) SpO2:  [100 %] 100 % (01/05 0600) FiO2 (%):  [40 %] 40 % (01/05 0600) Weight:  [75.2 kg (165 lb 12.6 oz)] 75.2 kg (165 lb 12.6 oz) (01/05 0400)   HEMODYNAMICS:     VENTILATOR SETTINGS: Vent Mode:  [-] PRVC FiO2 (%):  [40 %] 40 % Set Rate:  [16 bmp] 16 bmp Vt Set:  [600 mL] 600 mL PEEP:  [5 cmH20] 5 cmH20 Pressure Support:  [8 cmH20] 8 cmH20 Plateau Pressure:  [17 cmH20-21 cmH20] 20 cmH20   INTAKE /  OUTPUT:  Intake/Output Summary (Last 24 hours) at 01/30/14 0659 Last data filed at 01/30/14 0600  Gross per 24 hour  Intake 3575.56 ml  Output   7136 ml  Net -3560.44 ml   PHYSICAL EXAMINATION: General: sedated on vent , alert, follows commands HEENT: NCAT, EOMI PULM: crackles bilaterally, no wheezing CV: RRR, no mgr Ab: BS+, soft, nontender Ext: anasarca noted throughout Neuro: sedated on vent, intermittently agitated  LABS:  CBC  Recent Labs Lab 01/27/14 0539 01/28/14 0418 01/29/14 0350  WBC 10.3 9.7 10.8*  HGB 6.4* 8.0* 7.4*  HCT 19.9* 24.5* 22.8*  PLT 796* 753* 757*   BMET  Recent Labs Lab 01/29/14 0350 01/29/14 1600 01/30/14 0413  NA 137  137 136 137  K 3.9  3.9 3.9 3.6  CL 105  105 104 104  CO2 25  26 27 28   BUN 19  19 14 10   CREATININE 1.46*  1.48* 1.37* 1.19  GLUCOSE 109*  110* 119* 127*   Electrolytes  Recent Labs Lab 01/28/14 1650 01/29/14 0350 01/29/14 1600 01/30/14 0413  CALCIUM 7.5* 7.6*  7.6* 7.6* 7.9*  MG 2.2 2.1  --  2.2  PHOS 4.1  4.0 3.2 2.7 2.9   Sepsis Markers No results for input(s): LATICACIDVEN, PROCALCITON, O2SATVEN in the last 168 hours. ABG  Recent Labs Lab 01/25/14 0307 01/26/14 0433 01/29/14 0500  PHART 7.375 7.378 7.436  PCO2ART 44.1 41.3 38.3  PO2ART 79.9* 73.0* 92.9   Liver Enzymes  Recent Labs Lab 01/25/14 0540  01/29/14 0350 01/29/14 1600 01/30/14 0413  AST 23  --   --   --   --   ALT 20  --   --   --   --   ALKPHOS 58  --   --   --   --   BILITOT <0.1*  --   --   --   --   ALBUMIN 1.4*  < > 1.3* 1.5* 1.4*  < > = values in this interval not displayed. Glucose  Recent Labs Lab 01/29/14 0741 01/29/14 1210 01/29/14 1553 01/29/14 1924 01/29/14 2331 01/30/14 0430  GLUCAP 103* 97 104* 99 115* 100*    Imaging Dg Chest Port 1 View  01/29/2014   CLINICAL DATA:  44 year old male with shortness of Breath, community acquired pneumonia, sepsis, tachycardia. Initial encounter.  EXAM:  PORTABLE CHEST - 1 VIEW  COMPARISON:  01/28/2014 and earlier.  FINDINGS: Portable AP semi upright view at 0437 hrs. Stable endotracheal tube, tip at the level the clavicles. Left IJ approach dual lumen catheter has not significantly changed in position. Right subclavian approach single lumen central line is stable. Enteric tube courses to the abdomen as before, tip not included.  No pneumothorax. Bilateral veiling pulmonary opacity compatible with pleural effusions and superimposed bilateral lower lobe collapse or consolidation. Stable mild vascular congestion without overt edema. Overall ventilation has not changed.  IMPRESSION: 1.  Stable lines and tubes. 2. Stable ventilation with bilateral pleural effusions and lower lobe collapse/consolidation.   Electronically Signed   By: Lars Pinks M.D.   On: 01/29/2014 07:25   ASSESSMENT / PLAN:  PULMONARY OETT 12/22 >> 12/26>>12/26>>> A: Acute Hypoxic Respiratory Failure - in setting of RLL Strep pneumonia CAP.   R Pleural Effusion - 12/29 thora culture negative.   Likely pulmonary edema in setting of renal failure Tobacco Abuse P:   - Maintain full support; PSV trials daily as tolerated, to PS 20 if needed - Trach today - Continued neg balance required, has success last 24 hr - Daily WUA - Xopenex/Atrovent Q6 + Q3 PRN xopenex. - Abx as below. - Confusion is major issue for extubation  CARDIOVASCULAR CVL L IJ TLC 12/22 >> 12/30, R IJ TLC 12/30>>> A:  Severe Sepsis - in setting of PNA.  Required pressors 12/23, off 12/24 Tachycardia  Echo 1/1 showed preserved EF    P:  - Volume overload - continued neg balance (-3.6 L) - Continue SCD  RENAL A:   AKI > likely ATN vs AIN  Hypocalcemia anasarca P:   - Trend BMP in am with such neg balance, frequent - Replace electrolytes as indicated. - CRRT per renal  - Neg balance goals daily  GASTROINTESTINAL A:   Protein Calorie Malnutrition - albumin 2.0 Constipation noted. Etoh by history but  no evidence of hepatic dysfunction for the time being. P:   - TF as ordered at goal - Continue PPI BID - May need EGD, follow clinical course with PPI increased  HEMATOLOGIC A:   Macrocytic Anemia - in setting of ETOH abuse 1/2 hbg tr down , no sign of active bleeding  1/4 Hbg tr down again, FOBT+ on 1/2 but no frank bleeding Thrombocytosis - unclear etiology P:  - Thiamine / Folate / MVI. - Trend CBC. - Consider transfusing if Hbg < 7.0 - Lovenox d/c'd 1/3, on SCDs for DVT prophylaxis - Coags--  PT 15.5, INR 1.21, aPTT 39-- INR good for trach today -no evidence of clotting: obtain viscosity level  INFECTIOUS A:   RLL PNA - strep antigen positive  1/2 >temp and wbc tr down  P:   - BCx2 12/21 >>strep pneumo sens - UC  12/21 >> ng - Sputum 12/21 >> moderate strep pneumoniae - Blood 12/30>>> - Urine 12/30>>> - Sputum 12/30>>> - Pleural fluid culture 12/29>>>  - Abx: Rocephin, start date 12/21>>>12/30. - Abx: Azithro, 12/21>>12/25 - Vancomycin 12/30>>>12/31 - Zosyn 12/30>>>1/4  - Afebrile, CXR shows volume overload  ENDOCRINE A:   No acute issues  P:   - Monitor glucose on BMP  NEUROLOGIC A:   ETOH Abuse - at risk withdrawal Seizures - x2 overnight 12/23, fever up to 105 +/- ETOH withdrawal Anxiety / Depression  Concern for wernicke's per neuro. P:   - RASS goal: -1 - Continue PO ativan at 2 mg PO TID. - Fentanyl gtt for pain. - Precedex gtt (cap 2.4) = not effective, dc , avoid combining with versed drips etc - High dose thiamine per neuro  - Continue versed gtt - Continue Rispirdal, increase to 2 mg - May consider addition haldol q4h, prn this and check qtc today and in am  - Ativan required - Trach today may help reduce sedation -may require Depakote for refractory delirium, some hesitation with etoh  FAMILY  - Updates: Updated wife at bedside 1/2    Albin Felling, MD Internal Medicine Resident, PGY-1   STAFF NOTE: I, Merrie Roof, MD FACP have  personally reviewed patient's available data, including medical history, events of note, physical examination and test results as part of my evaluation. I have discussed with resident/NP and other care providers such as pharmacist, RN and RRT. In addition, I personally evaluated patient and elicited key findings of: schedule haldol, still severe delirium, rsipirdal increase, may need depakote, for trach, wean attempt ps 20, cvvhd on going, dc prexedex The patient is critically ill with multiple organ systems failure and requires high complexity decision making for assessment and support, frequent evaluation and titration of therapies, application of advanced monitoring technologies and extensive interpretation of multiple databases.   Critical Care Time devoted to patient care services described in this note is30 Minutes. This time reflects time of care of this signee: Merrie Roof, MD FACP. This critical care time does not reflect procedure time, or teaching time or supervisory time of PA/NP/Med student/Med Resident etc but could involve care discussion time. Rest per NP/medical resident whose note is outlined above and that I agree with   Lavon Paganini. Titus Mould, MD, [place] Pgr: [facility] 01/30/2014 8:43 AM

## 2014-01-30 NOTE — Procedures (Signed)
Bedside Tracheostomy Insertion Procedure Note   Patient Details:   Name: Allen Wood DOB: [birth year] MRN: [id number]  Procedure: Tracheostomy  Pre Procedure Assessment: ET Tube Size:7.5 ET Tube secured at lip (cm):25 Bite block in place: No Breath Sounds: Rhonch  Post Procedure Assessment: BP 80/40 mmHg  Pulse 135  Temp(Src) 97.6 F (36.4 C) (Oral)  Resp 20  Ht 5\' 11"  (1.803 m)  Wt 165 lb 12.6 oz (75.2 kg)  BMI 23.13 kg/m2  SpO2 97% O2 sats: stable throughout Complications: No apparent complications Patient did tolerate procedure well Tracheostomy Brand:Shiley Tracheostomy Style:Cuffed Tracheostomy Size: 8 Tracheostomy Secured CLE:[id number] Tracheostomy Placement Confirmation:Trach cuff visualized and in place and Chest X ray ordered for placement    Ciro Backer 01/30/2014, 2:50 PM

## 2014-01-31 ENCOUNTER — Inpatient Hospital Stay (HOSPITAL_COMMUNITY): Payer: Medicaid Other

## 2014-01-31 DIAGNOSIS — R633 Feeding difficulties: Secondary | ICD-10-CM

## 2014-01-31 DIAGNOSIS — R195 Other fecal abnormalities: Secondary | ICD-10-CM

## 2014-01-31 LAB — GLUCOSE, CAPILLARY
GLUCOSE-CAPILLARY: 86 mg/dL (ref 70–99)
Glucose-Capillary: 104 mg/dL — ABNORMAL HIGH (ref 70–99)
Glucose-Capillary: 85 mg/dL (ref 70–99)
Glucose-Capillary: 89 mg/dL (ref 70–99)
Glucose-Capillary: 96 mg/dL (ref 70–99)
Glucose-Capillary: 99 mg/dL (ref 70–99)

## 2014-01-31 LAB — POCT I-STAT 3, ART BLOOD GAS (G3+)
Acid-base deficit: 2 mmol/L (ref 0.0–2.0)
Bicarbonate: 23 mEq/L (ref 20.0–24.0)
O2 SAT: 87 %
PCO2 ART: 34.1 mmHg — AB (ref 35.0–45.0)
PO2 ART: 40 mmHg — AB (ref 80.0–100.0)
Patient temperature: 90.6
TCO2: 24 mmol/L (ref 0–100)
pH, Arterial: 7.417 (ref 7.350–7.450)

## 2014-01-31 LAB — CARBOXYHEMOGLOBIN
Carboxyhemoglobin: 0.9 % (ref 0.5–1.5)
Methemoglobin: 0.8 % (ref 0.0–1.5)
O2 Saturation: 61.8 %
Total hemoglobin: 8.3 g/dL — ABNORMAL LOW (ref 13.5–18.0)

## 2014-01-31 LAB — RENAL FUNCTION PANEL
ANION GAP: 6 (ref 5–15)
Albumin: 1.3 g/dL — ABNORMAL LOW (ref 3.5–5.2)
Albumin: 1.5 g/dL — ABNORMAL LOW (ref 3.5–5.2)
Anion gap: 9 (ref 5–15)
BUN: 9 mg/dL (ref 6–23)
BUN: 9 mg/dL (ref 6–23)
CALCIUM: 7.5 mg/dL — AB (ref 8.4–10.5)
CHLORIDE: 106 meq/L (ref 96–112)
CHLORIDE: 106 meq/L (ref 96–112)
CO2: 25 mmol/L (ref 19–32)
CO2: 23 mmol/L (ref 19–32)
CREATININE: 1.38 mg/dL — AB (ref 0.50–1.35)
Calcium: 7.7 mg/dL — ABNORMAL LOW (ref 8.4–10.5)
Creatinine, Ser: 1.38 mg/dL — ABNORMAL HIGH (ref 0.50–1.35)
GFR calc Af Amer: 70 mL/min — ABNORMAL LOW (ref >90)
GFR calc non Af Amer: 60 mL/min — ABNORMAL LOW (ref >90)
GFR, EST AFRICAN AMERICAN: 70 mL/min — AB (ref >90)
GFR, EST NON AFRICAN AMERICAN: 60 mL/min — AB (ref >90)
GLUCOSE: 102 mg/dL — AB (ref 70–99)
Glucose, Bld: 93 mg/dL (ref 70–99)
POTASSIUM: 4 mmol/L (ref 3.5–5.1)
Phosphorus: 2.8 mg/dL (ref 2.3–4.6)
Phosphorus: 3.5 mg/dL (ref 2.3–4.6)
Potassium: 4.3 mmol/L (ref 3.5–5.1)
SODIUM: 137 mmol/L (ref 135–145)
SODIUM: 138 mmol/L (ref 135–145)

## 2014-01-31 LAB — CBC
HCT: 21.2 % — ABNORMAL LOW (ref 39.0–52.0)
Hemoglobin: 7 g/dL — ABNORMAL LOW (ref 13.0–17.0)
MCH: 34.5 pg — AB (ref 26.0–34.0)
MCHC: 33 g/dL (ref 30.0–36.0)
MCV: 104.4 fL — ABNORMAL HIGH (ref 78.0–100.0)
Platelets: 641 10*3/uL — ABNORMAL HIGH (ref 150–400)
RBC: 2.03 MIL/uL — ABNORMAL LOW (ref 4.22–5.81)
RDW: 14.2 % (ref 11.5–15.5)
WBC: 12.7 10*3/uL — AB (ref 4.0–10.5)

## 2014-01-31 LAB — APTT: aPTT: 39 seconds — ABNORMAL HIGH (ref 24–37)

## 2014-01-31 LAB — URINALYSIS, ROUTINE W REFLEX MICROSCOPIC
GLUCOSE, UA: 100 mg/dL — AB
Ketones, ur: NEGATIVE mg/dL
Nitrite: NEGATIVE
Protein, ur: 30 mg/dL — AB
Specific Gravity, Urine: 1.024 (ref 1.005–1.030)
UROBILINOGEN UA: 0.2 mg/dL (ref 0.0–1.0)
pH: 5 (ref 5.0–8.0)

## 2014-01-31 LAB — URINE MICROSCOPIC-ADD ON

## 2014-01-31 LAB — CLOSTRIDIUM DIFFICILE BY PCR: Toxigenic C. Difficile by PCR: NEGATIVE

## 2014-01-31 LAB — LACTIC ACID, PLASMA: LACTIC ACID, VENOUS: 0.9 mmol/L (ref 0.5–2.2)

## 2014-01-31 LAB — PROCALCITONIN: Procalcitonin: 0.44 ng/mL

## 2014-01-31 LAB — MAGNESIUM: MAGNESIUM: 2.2 mg/dL (ref 1.5–2.5)

## 2014-01-31 LAB — CORTISOL: CORTISOL PLASMA: 16.1 ug/dL

## 2014-01-31 MED ORDER — LORAZEPAM 2 MG/ML IJ SOLN
2.0000 mg | INTRAMUSCULAR | Status: DC
  Administered 2014-01-31 (×2): 2 mg via INTRAVENOUS
  Filled 2014-01-31 (×2): qty 1

## 2014-01-31 MED ORDER — PHENYLEPHRINE HCL 10 MG/ML IJ SOLN
0.0000 ug/min | INTRAVENOUS | Status: DC
  Administered 2014-02-01: 100 ug/min via INTRAVENOUS
  Administered 2014-02-01: 80 ug/min via INTRAVENOUS
  Administered 2014-02-02: 40 ug/min via INTRAVENOUS
  Filled 2014-01-31 (×4): qty 4

## 2014-01-31 MED ORDER — ETOMIDATE 2 MG/ML IV SOLN
INTRAVENOUS | Status: AC
  Administered 2014-01-31: 20 mg
  Filled 2014-01-31: qty 10

## 2014-01-31 MED ORDER — VANCOMYCIN HCL IN DEXTROSE 750-5 MG/150ML-% IV SOLN
750.0000 mg | INTRAVENOUS | Status: DC
  Administered 2014-02-01: 750 mg via INTRAVENOUS
  Filled 2014-01-31 (×3): qty 150

## 2014-01-31 MED ORDER — LORAZEPAM 2 MG/ML IJ SOLN
4.0000 mg | INTRAMUSCULAR | Status: DC
  Administered 2014-01-31 – 2014-02-03 (×12): 4 mg via INTRAVENOUS
  Filled 2014-01-31 (×12): qty 2

## 2014-01-31 MED ORDER — VANCOMYCIN 50 MG/ML ORAL SOLUTION
125.0000 mg | Freq: Four times a day (QID) | ORAL | Status: DC
  Administered 2014-01-31 – 2014-02-01 (×4): 125 mg via ORAL
  Filled 2014-01-31 (×8): qty 2.5

## 2014-01-31 MED ORDER — DEXTROSE 5 % IV SOLN
2.0000 g | Freq: Two times a day (BID) | INTRAVENOUS | Status: DC
  Administered 2014-01-31 – 2014-02-02 (×6): 2 g via INTRAVENOUS
  Filled 2014-01-31 (×8): qty 2

## 2014-01-31 MED ORDER — VALPROATE SODIUM 500 MG/5ML IV SOLN
250.0000 mg | Freq: Three times a day (TID) | INTRAVENOUS | Status: DC
  Administered 2014-01-31 – 2014-02-02 (×6): 250 mg via INTRAVENOUS
  Filled 2014-01-31 (×8): qty 2.5

## 2014-01-31 MED ORDER — LORAZEPAM 2 MG/ML IJ SOLN
INTRAMUSCULAR | Status: AC
  Administered 2014-01-31: 2 mg
  Filled 2014-01-31: qty 1

## 2014-01-31 MED ORDER — SODIUM CHLORIDE 0.9 % IV BOLUS (SEPSIS)
1000.0000 mL | Freq: Once | INTRAVENOUS | Status: AC
  Administered 2014-01-31: 1000 mL via INTRAVENOUS

## 2014-01-31 MED ORDER — PANTOPRAZOLE SODIUM 40 MG PO PACK
40.0000 mg | PACK | Freq: Two times a day (BID) | ORAL | Status: DC
  Administered 2014-01-31 – 2014-02-02 (×6): 40 mg
  Filled 2014-01-31 (×8): qty 20

## 2014-01-31 MED ORDER — SODIUM CHLORIDE 0.9 % IV SOLN
1500.0000 mg | Freq: Once | INTRAVENOUS | Status: AC
  Administered 2014-01-31: 1500 mg via INTRAVENOUS
  Filled 2014-01-31: qty 1500

## 2014-01-31 MED ORDER — SERTRALINE HCL 50 MG PO TABS
50.0000 mg | ORAL_TABLET | Freq: Every day | ORAL | Status: DC
  Administered 2014-01-31 – 2014-02-02 (×3): 50 mg via ORAL
  Filled 2014-01-31 (×4): qty 1

## 2014-01-31 MED ORDER — ETOMIDATE 2 MG/ML IV SOLN: 20.0000 mg | Freq: Once | INTRAVENOUS | Status: AC

## 2014-01-31 NOTE — Procedures (Signed)
Arterial Catheter Insertion Procedure Note Davinder Haff [id number] November 17, 1970  Procedure: Insertion of Arterial Catheter  Indications: Blood pressure monitoring and Frequent blood sampling  Procedure Details Consent: Risks of procedure as well as the alternatives and risks of each were explained to the (patient/caregiver).  Consent for procedure obtained. Time Out: Verified patient identification, verified procedure, site/side was marked, verified correct patient position, special equipment/implants available, medications/allergies/relevent history reviewed, required imaging and test results available.  Performed  Maximum sterile technique was used including antiseptics, cap, gloves, gown, hand hygiene, mask and sheet. Skin prep: Chlorhexidine; local anesthetic administered 20 gauge catheter was inserted into right femoral artery using the Seldinger technique.  Evaluation Blood flow good; BP tracing good. Complications: No apparent complications.   Raylene Miyamoto 01/31/2014   [place] emergent need Shock  Lavon Paganini. Titus Mould, MD, [place] Pgr: [place] [facility]

## 2014-01-31 NOTE — Progress Notes (Signed)
[facility] Progress Note Patient Name: Allen Wood DOB: February 10, 1970 MRN: [id number]   Date of Service  01/31/2014  HPI/Events of Note  Patient with moderate agitation and combative  eICU Interventions  Restart fentanyl, adjust ativan to 4mg  q4hrs.     Intervention Category Minor Interventions: Agitation / anxiety - evaluation and management  Keegan Bensch 01/31/2014, 3:52 PM

## 2014-01-31 NOTE — Progress Notes (Signed)
PULMONARY / CRITICAL CARE MEDICINE   Name: Allen Wood MRN: [id number] DOB: Jan 18, 1971    ADMISSION DATE:  01/15/2014 CONSULTATION DATE:  01/16/14  REFERRING MD :  Dr. Grandville Silos   CHIEF COMPLAINT:  Acute Respiratory Failure   INITIAL PRESENTATION: 44 y/o M, smoker / ETOH abuse, admitted 12/21 with ongoing fatigue, cough with productive yellow sputum, R posterior back pain, chills & sweats.  Work up found patient to have RLL PNA with positive strep antigen.  Developed worsening respiratory failure 12/22  STUDIES:  12/24 EEG >> neg 1/1 Echo >EF 60%, no wall abn,   SIGNIFICANT EVENTS: 12/21  Admit with RLL PNA 12/22  Decompensated, required intubation for respiratory failure in setting of PNA 12/23  Stable on vent, increased pleural effusion on CXR.  Levo @ 2 mcg, Fent 112mcg, Precedex 0.6.  Seizures overnight, Tmax 105 12/24  Concern for ileus with TF's coming out of mouth overnight.  Reglan dosing 12/26  Reintubated due to inability to clear secretions 12/30 Cr rising, UOP dropping. 12/31 Cr up to 1.9, UOP 250 ml over 24 hours, renal consult called 1/1 transfused 1 U pRBC;  1/1 Transferred to [facility] , CRRT per renal  1/2 Transfused 1 U pRBC 1/5 Trach placed  SUBJECTIVE:  Spiked fever 102.1 yesterday afternoon Tolerating CRRT 100cc/h Sedated, easily agitated  Trach in place Gross neg balance   VITAL SIGNS: Temp:  [97.6 F (36.4 C)-102.1 F (38.9 C)] 97.8 F (36.6 C) (01/06 0441) Pulse Rate:  [43-160] 43 (01/06 0630) Resp:  [11-35] 16 (01/06 0630) BP: (67-132)/(39-80) 89/64 mmHg (01/06 0630) SpO2:  [89 %-100 %] 100 % (01/06 0630) FiO2 (%):  [40 %-100 %] 40 % (01/06 0310) Weight:  [75.6 kg (166 lb 10.7 oz)] 75.6 kg (166 lb 10.7 oz) (01/06 0348)   HEMODYNAMICS:     VENTILATOR SETTINGS: Vent Mode:  [-] PRVC FiO2 (%):  [40 %-100 %] 40 % Set Rate:  [16 bmp] 16 bmp Vt Set:  [600 mL] 600 mL PEEP:  [5 cmH20] 5 cmH20 Pressure Support:  [8 cmH20] 8 cmH20 Plateau  Pressure:  [14 cmH20-29 cmH20] 29 cmH20   INTAKE / OUTPUT:  Intake/Output Summary (Last 24 hours) at 01/31/14 0701 Last data filed at 01/31/14 0600  Gross per 24 hour  Intake 1453.22 ml  Output   2042 ml  Net -588.78 ml   PHYSICAL EXAMINATION: General: sedated on vent  HEENT: NCAT, EOMI PULM: crackles bilaterally, no wheezing, trach CV: RRR, no mgr Ab: BS+, soft, nontender Ext: anasarca noted throughout Neuro: sedated on vent, intermittently agitated  LABS:  CBC  Recent Labs Lab 01/29/14 0350 01/30/14 0705 01/31/14 0223  WBC 10.8* 10.9* 12.7*  HGB 7.4* 7.8* 7.0*  HCT 22.8* 23.9* 21.2*  PLT 757* 767* 641*   BMET  Recent Labs Lab 01/30/14 0413 01/30/14 1552 01/31/14 0223  NA 137 135 137  K 3.6 4.3 4.0  CL 104 104 106  CO2 28 27 25   BUN 10 12 9   CREATININE 1.19 1.53* 1.38*  GLUCOSE 127* 107* 102*   Electrolytes  Recent Labs Lab 01/29/14 0350  01/30/14 0413 01/30/14 1552 01/31/14 0223  CALCIUM 7.6*  7.6*  < > 7.9* 7.7* 7.5*  MG 2.1  --  2.2  --  2.2  PHOS 3.2  < > 2.9 3.0 2.8  < > = values in this interval not displayed. Sepsis Markers No results for input(s): LATICACIDVEN, PROCALCITON, O2SATVEN in the last 168 hours. ABG  Recent Labs Lab 01/25/14 [phone]  01/26/14 0433 01/29/14 0500  PHART 7.375 7.378 7.436  PCO2ART 44.1 41.3 38.3  PO2ART 79.9* 73.0* 92.9   Liver Enzymes  Recent Labs Lab 01/25/14 0540  01/30/14 0413 01/30/14 1552 01/31/14 0223  AST 23  --   --   --   --   ALT 20  --   --   --   --   ALKPHOS 58  --   --   --   --   BILITOT <0.1*  --   --   --   --   ALBUMIN 1.4*  < > 1.4* 1.5* 1.3*  < > = values in this interval not displayed. Glucose  Recent Labs Lab 01/30/14 0430 01/30/14 0750 01/30/14 1525 01/30/14 1950 01/30/14 2353 01/31/14 0444  GLUCAP 100* 100* 110* 95 99 96    Imaging Dg Chest Port 1 View  01/30/2014   CLINICAL DATA:  Post tracheostomy  EXAM: PORTABLE CHEST - 1 VIEW  COMPARISON:  01/29/2014   FINDINGS: Cardiomediastinal silhouette is stable. Tracheostomy tube in place. No pneumothorax. Stable right subclavian central line position. Stable left IJ central line catheter with tip in distal SVC. NG tube is unchanged in position. Central mild vascular congestion and mild perihilar interstitial prominence without significant change from prior exam. Stable small right pleural effusion with right lower lobe atelectasis or infiltrate. There is small loculated left basilar pleural effusion with left basilar atelectasis. Small amount of fluid along the left major fissure or partial atelectasis left upper lobe.  IMPRESSION: Tracheostomy tube in place. No pneumothorax. Stable right subclavian and left IJ central line position. Central mild vascular congestion and mild perihilar interstitial prominence without change from prior exam. Stable small right pleural effusion with right lower lobe atelectasis or infiltrate. There is small loculated left basilar pleural effusion with left basilar atelectasis. Small amount of fluid along the left major fissure or partial atelectasis left upper lobe.   Electronically Signed   By: Lahoma Crocker M.D.   On: 01/30/2014 14:03   Dg Abd Portable 1v  01/30/2014   CLINICAL DATA:  Encounter for feeding tube placement.  EXAM: PORTABLE ABDOMEN - 1 VIEW  COMPARISON:  January 20, 2014.  FINDINGS: Distal tip of feeding tube is seen in lower abdomen most consistent with distal stomach. No abnormal bowel gas pattern is noted. No abnormal calcifications are noted.  IMPRESSION: Distal tip of feeding tube is seen in lower abdomen consistent with distal portion of low lying stomach.   Electronically Signed   By: Sabino Dick M.D.   On: 01/30/2014 13:46   ASSESSMENT / PLAN:  PULMONARY OETT 12/22 >> 12/26>>12/26>>> Trach 1/5 (df)>>> A: Acute Hypoxic Respiratory Failure - in setting of RLL Strep pneumonia CAP.   R Pleural Effusion - 12/29 thora culture negative.   Likely pulmonary edema in  setting of renal failure Tobacco Abuse P:   - CXR with new left loculated basilar pleural effusion vs mostly atx--> repeat pcxr in am  - Maintain full support; PSV trials daily as tolerated, to PS 10-15 cpap 5, goal 4-6 hrs - Continued neg balance required, successful over last 48 hrs - Daily WUA - Xopenex/Atrovent Q6 + Q3 PRN xopenex. -need reduction in sedation to wean successfully -will [place] chest by pccm  CARDIOVASCULAR CVL L IJ TLC 12/22 >> 12/30, R IJ TLC 12/30>>> A:  Severe Sepsis - in setting of PNA.  Required pressors 12/23, off 12/24 Tachycardia -improved Echo 1/1 showed preserved EF    P:  -  Volume overload - continued neg balance  - Continue SCD  RENAL A:   AKI > likely ATN vs AIN  Hypocalcemia anasarca P:   - Replace electrolytes as indicated. - CRRT per renal  - Neg balance goals daily  GASTROINTESTINAL A:   Protein Calorie Malnutrition - albumin 2.0 Constipation noted. Etoh by history but no evidence of hepatic dysfunction for the time being. High residuals R/o cdiff P:   - TF as ordered at goal - Continue PPI BID - May need EGD, follow clinical course with PPI increased - PEG ?-- call GI for both -kub -start TF at 20 , no increase -may need reglan, assess qtc  HEMATOLOGIC A:   Macrocytic Anemia - in setting of ETOH abuse 1/2 hbg tr down , no sign of active bleeding  1/4 Hbg tr down again, FOBT+ on 1/2 but no frank bleeding Thrombocytosis - likely reactive vs. Iron deficiency anemia in setting of possible GI bleeding 1/6 Hbg trend down again to 7.0 P:  - Thiamine / Folate / MVI. - Trend CBC, check this afternoon - Consider transfusing if Hbg < 7.0 - Lovenox d/c'd 1/3, on SCDs for DVT prophylaxis - no evidence of clotting: viscosity level pending -concern is GI bleed with neg balance daily and hct still dropping  INFECTIOUS A:   RLL PNA - strep antigen positive  1/2 >temp and wbc tr down  P:   - BCx2 12/21 >>strep pneumo sens - UC  12/21  >> ng - Sputum 12/21 >> moderate strep pneumoniae - Blood 12/30>>> ng - Urine 12/30>>> ng - Sputum 12/30>>> abundant WBCs, no organisms - Pleural fluid culture 12/29>>> WBCs, no organisms -BC 1/6>>>   - Abx: Rocephin, start date 12/21>>>12/30. - Abx: Azithro, 12/21>>12/25 - Vancomycin 12/30>>>12/31 - Zosyn 12/30>>>1/4 -ceftazidime 1/6>>>  - Fever of 102.1- repeat BC now, [place] chest, UA, send cdiff Start empiric abx, add vanc if drop in BP Pct algo to reduce abx exposure  ENDOCRINE A:   No acute issues  P:   - Monitor glucose on BMP  NEUROLOGIC A:   ETOH Abuse - at risk withdrawal Seizures - x2 overnight 12/23, fever up to 105 +/- ETOH withdrawal Anxiety / Depression  Concern for wernicke's per neuro. P:   - RASS goal: -1 -dc ativan  - Fentanyl gtt for pain - Precedex dc'ed - Thiamine per neuro  - Continue versed gtt - reduce as able,adding haldol - Continue Risperdal 2 mg BID - Haldol q4h PRN, will administer - May require Depakote, improved to some degree without, hold -add iV atvan in order to dc drip, hold rass -2/-3 -likely to fent patch soon  FAMILY  - Updates: Updated wife at bedside 1/2, 1/5, 1/6    Albin Felling, MD Internal Medicine Resident, PGY-1   STAFF NOTE: I, Merrie Roof, MD FACP have personally reviewed patient's available data, including medical history, events of note, physical examination and test results as part of my evaluation. I have discussed with resident/NP and other care providers such as pharmacist, RN and RRT. In addition, I personally evaluated patient and elicited key findings of: .slight abdo distention, residuakls, I updated wife, trach went well, PS 8 weaning, peg needed, EGD needed, gi consult, add ativan IV, to dc versed drip, send cdiff and other work up forfever, start ceftaz The patient is critically ill with multiple organ systems failure and requires high complexity decision making for assessment and support, frequent  evaluation and titration of therapies, application of advanced  monitoring technologies and extensive interpretation of multiple databases.   Critical Care Time devoted to patient care services described in this note is30 Minutes. This time reflects time of care of this signee: Merrie Roof, MD FACP. This critical care time does not reflect procedure time, or teaching time or supervisory time of PA/NP/Med student/Med Resident etc but could involve care discussion time. Rest per NP/medical resident whose note is outlined above and that I agree with   Lavon Paganini. Titus Mould, MD, [place] Pgr: [facility] 01/31/2014 8:37 AM

## 2014-01-31 NOTE — Consult Note (Signed)
[facility] Consult: 1:30 PM 01/31/2014  LOS: 16 days    Referring Provider: Dr Titus Mould  Primary Care Physician:  [place] Primary Gastroenterologist:  Althia Forts.     Reason for Consultation:  Request PEG placement and eval of FOBT+ anemia.    HPI: Allen Wood is a 44 y.o. male.  Alcoholic (784ON Vodka daily per notes) smoker with hx depression and pna 08/2013 who was admitted > 2 weeks ago with streptococcal pneumonia.  Remains on vent support. S/p trach placement 01/30/14 with post op hypotension requiring fluid boluses overnight. Latest CXR shows unchanged right sided PNA, decreased LUL atx but increasing left base airspace disease. Temp of 90.1 at 0915 this AM but up to 102.1 at 1600 yesterday  Has anuric ARF requiring CRRT. Early on had seizures attributed by neuro to ETOH withdrawal.   Hgb drifted from 10 at admit down to nadir of 6.4 on 01/27/14 when 1 unit PRBC transfused.  Baseline in 08/2013 was 12.5.  MCV elevated. No overt bleeding, just FOBT +.    No known chronic liver disease.  PT only slightly elevated at 16.3 five days ago but INR normal.   Malnourished with Albumin 1.3.  Initial AST/ALT of 87/29 has normalized.  Alk phos and T bili normal.   No previous/current abdominal CT or ultrasounds.   Early on ileus suspected to have caused TF intolerance but currently tolerating Vitall AF1.2 tube feeding at 62m per hour. NOBGP on KUB today.  Passing liquid stool per flexiseal.  This is C diff negative but was FOBT + on 1/2.     Past Medical History  Diagnosis Date  . Alcohol abuse   . Depression   . Anxiety   . Pneumonia     Past Surgical History  Procedure Laterality Date  . Mouth surgery      teeth removed.  . Tracheostomy  01/30/14    feinstein    Prior  to Admission medications   Medication Sig Start Date End Date Taking? Authorizing Provider  buPROPion (WELLBUTRIN XL) 150 MG 24 hr tablet Take 150 mg by mouth every morning.   Yes Historical Provider, MD  sertraline (ZOLOFT) 50 MG tablet Take 50 mg by mouth at bedtime.   Yes Historical Provider, MD  azithromycin (ZITHROMAX) 500 MG tablet Take 1 tablet (500 mg total) by mouth daily. Patient not taking: Reported on 01/15/2014 09/15/13   ACharlynne Cousins MD  predniSONE (DELTASONE) 10 MG tablet Takes 6 tablets for 1 days, then 5 tablets for 1 days, then 4 tablets for 1 days, then 3 tablets for 1 days, then 2 tabs for 1 days, then 1 tab for 1 days, and then stop. Patient not taking: Reported on 01/15/2014 09/15/13   ACharlynne Cousins MD    Scheduled Meds: . antiseptic oral rinse  7 mL Mouth Rinse QID  . cefTAZidime (FORTAZ)  IV  2 g Intravenous Q12H  . chlorhexidine  15 mL Mouth Rinse BID  . folic acid  1 mg Oral Daily  . guaiFENesin  20 mL Per Tube 6 times per day  . ipratropium-albuterol  3 mL Nebulization Q6H  . LORazepam  2 mg Intravenous Q4H  . multivitamin  5 mL Per Tube Daily  . pantoprazole sodium  40 mg Per Tube BID  . risperiDONE  2 mg Oral BID  . sertraline  50 mg Oral Daily  . thiamine  100 mg Oral Daily   Or  . thiamine  100 mg Intravenous Daily  . vancomycin  1,500 mg Intravenous Once  . vancomycin  125 mg Oral 4 times per day  . [START ON 02/01/2014] vancomycin  750 mg Intravenous Q24H   Infusions: . sodium chloride 10 mL/hr at 01/31/14 1319  . dexmedetomidine Stopped (01/31/14 0600)  . feeding supplement (VITAL AF 1.2 CAL) Stopped (01/30/14 0700)  . fentaNYL infusion INTRAVENOUS 50 mcg/hr (01/31/14 1317)  . midazolam (VERSED) infusion Stopped (01/31/14 1015)  . phenylephrine (NEO-SYNEPHRINE) Adult infusion    . dialysis replacement fluid (prismasate) 200 mL/hr at 01/31/14 0704  . dialysis replacement fluid (prismasate) 200 mL/hr at 01/30/14 9675  . dialysate  (PRISMASATE) 1,500 mL/hr at 01/31/14 1312   PRN Meds: acetaminophen (TYLENOL) oral liquid 160 mg/5 mL, bisacodyl, docusate **OR** docusate, fentaNYL, haloperidol lactate, heparin, heparin, labetalol, midazolam   Allergies as of 01/15/2014  . (No Known Allergies)    Family History  Problem Relation Age of Onset  . Cancer Mother     lung  . Cancer Father     kidney    History   Social History  . Marital Status: Married    Spouse Name: N/A    Number of Children: N/A  . Years of Education: N/A   Occupational History  . Not on file.   Social History Main Topics  . Smoking status: Current Every Day Smoker -- 0.50 packs/day for 20 years    Types: Cigarettes  . Smokeless tobacco: Never Used  . Alcohol Use: 0.0 oz/week    0 Not specified per week     Comment: pint- fifth of vodka a day  . Drug Use: No  . Sexual Activity: Not Currently   Other Topics Concern  . Not on file   Social History Narrative    REVIEW OF SYSTEMS: Unable to obtain significant review of systems from the patient who is intermittently following simple commands, unable to verbalize due to trach.   PHYSICAL EXAM: Vital signs in last 24 hours: Filed Vitals:   01/31/14 1250  BP: 71/49  Pulse: 128  Temp: 90.1  Resp: 32   Wt Readings from Last 3 Encounters:  01/31/14 166 lb 10.7 oz (75.6 kg)  09/14/13 144 lb 14.4 oz (65.726 kg)    General:WM who appears his stated age. Does not look toxic. In fact he looks better than would be expected given all of his medical issues.  He is covered by a bear hugger heating blanket Head:  no facial edema, no facial asymmetry. No signs of head trauma.  Eyes:  No scleral icterus. No conjunctival pallor. Ears:   unable to fully assess his hearing but he does appear to hear my questions.   Nose:   small feeding tube in the nares. No blood visible in the nostrils.  Mouth:   no obvious bleeding. Some thin white exudate in patches over the surface of the tongue.  Mucous membranes are moist.  Neck:   there is a dialysis catheter in the base of the neck. Lurline Idol is present. No obvious bleeding coming from the  trach or other lines. Crackles bilaterally. No wheezing. No obvious respiratory distress. Heart: Regular rhythm. Slightly tacky.  Abdomen:   ND, NT,  bowel sounds present. No tympanitic bowel sounds. No masses or organomegaly. No bruits.  Rectal:  Deferred.  There is light to medium brown, liquid stool in the flexiseal bag.   Musc/Skeltl:  no joint erythema, swelling or contractures.  Extremities:  Slight edema in upper extremities, none in lower extremities.  Hands covered with protective "mittens"  Neurologic: Inconsistently following simple commands. Skin:   no rashes, sores, or telangiectasia visible. Tatoos  none seen    Psych:   Currently not agitated.  Intake/Output from previous day: 01/05 0701 - 01/06 0700 In: 1481.2 [I.V.:1481.2] Out: 2132 [Urine:75] Intake/Output this shift: Total I/O In: 1862.5 [I.V.:72.5; NG/GT:240; IV Piggyback:1550] Out: 144 [Other:454]  LAB RESULTS:  Recent Labs  01/29/14 0350 01/30/14 0705 01/31/14 0223  WBC 10.8* 10.9* 12.7*  HGB 7.4* 7.8* 7.0*  HCT 22.8* 23.9* 21.2*  PLT 757* 767* 641*   BMET Lab Results  Component Value Date   NA 137 01/31/2014   NA 135 01/30/2014   NA 137 01/30/2014   K 4.0 01/31/2014   K 4.3 01/30/2014   K 3.6 01/30/2014   CL 106 01/31/2014   CL 104 01/30/2014   CL 104 01/30/2014   CO2 25 01/31/2014   CO2 27 01/30/2014   CO2 28 01/30/2014   GLUCOSE 102* 01/31/2014   GLUCOSE 107* 01/30/2014   GLUCOSE 127* 01/30/2014   BUN 9 01/31/2014   BUN 12 01/30/2014   BUN 10 01/30/2014   CREATININE 1.38* 01/31/2014   CREATININE 1.53* 01/30/2014   CREATININE 1.19 01/30/2014   CALCIUM 7.5* 01/31/2014   CALCIUM 7.7* 01/30/2014   CALCIUM 7.9* 01/30/2014   LFT  Recent Labs  01/30/14 0413 01/30/14 1552 01/31/14 0223  ALBUMIN 1.4* 1.5* 1.3*   PT/INR Lab Results    Component Value Date   INR 1.21 01/29/2014   INR 1.29 01/26/2014   Hepatitis Panel No results for input(s): HEPBSAG, HCVAB, HEPAIGM, HEPBIGM in the last 72 hours. C-Diff No components found for: CDIFF Lipase  No results found for: LIPASE  Drugs of Abuse     Component Value Date/Time   LABOPIA NONE DETECTED 10/31/2012 1930   COCAINSCRNUR NONE DETECTED 10/31/2012 1930   LABBENZ NONE DETECTED 10/31/2012 1930   AMPHETMU NONE DETECTED 10/31/2012 1930   THCU NONE DETECTED 10/31/2012 1930   LABBARB NONE DETECTED 10/31/2012 1930     RADIOLOGY STUDIES: Dg Chest Port 1 View  01/31/2014   CLINICAL DATA:  Sepsis.  Community acquired pneumonia.  EXAM: PORTABLE CHEST - 1 VIEW  COMPARISON:  01/30/2014  FINDINGS: Support lines and tubes remain in appropriate position. Right mid and lower lung airspace disease and small right pleural effusion show no significant change.  There has been decrease in atelectasis in the left upper lobe since previous study. Increased airspace disease is seen in the left lung base.  Heart size remains normal.  No pneumothorax visualized.  IMPRESSION: Decreased left upper lobe atelectasis, but increased in airspace disease in left lung base.  No significant change in right mid and lower lung airspace disease and small pleural effusion.   Electronically Signed   By: Earle Gell M.D.   On: 01/31/2014 11:37   Dg Chest Port 1 View  01/30/2014   CLINICAL DATA:  Post tracheostomy  EXAM: PORTABLE CHEST - 1 VIEW  COMPARISON:  01/29/2014  FINDINGS: Cardiomediastinal silhouette is stable. Tracheostomy  tube in place. No pneumothorax. Stable right subclavian central line position. Stable left IJ central line catheter with tip in distal SVC. NG tube is unchanged in position. Central mild vascular congestion and mild perihilar interstitial prominence without significant change from prior exam. Stable small right pleural effusion with right lower lobe atelectasis or infiltrate. There is small  loculated left basilar pleural effusion with left basilar atelectasis. Small amount of fluid along the left major fissure or partial atelectasis left upper lobe.  IMPRESSION: Tracheostomy tube in place. No pneumothorax. Stable right subclavian and left IJ central line position. Central mild vascular congestion and mild perihilar interstitial prominence without change from prior exam. Stable small right pleural effusion with right lower lobe atelectasis or infiltrate. There is small loculated left basilar pleural effusion with left basilar atelectasis. Small amount of fluid along the left major fissure or partial atelectasis left upper lobe.   Electronically Signed   By: Lahoma Crocker M.D.   On: 01/30/2014 14:03   Dg Abd Portable 1v  01/31/2014   CLINICAL DATA:  Abdominal distension  EXAM: PORTABLE ABDOMEN - 1 VIEW  COMPARISON:  Portable exam 0959 hr compared to 01/30/2014  FINDINGS: Tip of feeding tube projects over distal gastric antrum.  Nonobstructive bowel gas pattern.  No bowel dilatation or bowel wall thickening.  Osseous structures unremarkable.  Pleural effusion identified at RIGHT lung base with additional bibasilar opacities which could represent atelectasis or infiltrate.  IMPRESSION: RIGHT pleural effusion with question bibasilar atelectasis versus infiltrate.  Nonobstructive bowel gas pattern.  Tip of feeding tube projects over distal gastric antrum.   Electronically Signed   By: Lavonia Dana M.D.   On: 01/31/2014 10:14   Dg Abd Portable 1v  01/30/2014   CLINICAL DATA:  Encounter for feeding tube placement.  EXAM: PORTABLE ABDOMEN - 1 VIEW  COMPARISON:  January 20, 2014.  FINDINGS: Distal tip of feeding tube is seen in lower abdomen most consistent with distal stomach. No abnormal bowel gas pattern is noted. No abnormal calcifications are noted.  IMPRESSION: Distal tip of feeding tube is seen in lower abdomen consistent with distal portion of low lying stomach.   Electronically Signed   By: Sabino Dick M.D.   On: 01/30/2014 13:46    ENDOSCOPIC STUDIES: None ever  IMPRESSION:   *  Prolonged vent/trach requiring strep + CAP.  Now needs PEG for long term access for tube feeds.  *  Macrocytic anemia in setting of acute, critical illness: causes are multifactorial. FOBT +: Rule out gastritis, ulcers, vascular malformations.  Thus far no evidence of chronic liver disease/cirrhosis which would add portal htn gastropathy and varices to potential causes. Stools non bloody but diarrheal: rule out colitis.   He is C diff negative.   *  Anuric ARF requiring CRRT.  *  Fever and hypothermia.  Hypotension.    *  Alcoholism.    PLAN:     *  Per Dr Alonna Buckler  01/31/2014, 1:30 PM Pager: [phone]      Attending physician's note   I have taken a history, examined the patient and reviewed the chart. I agree with the Advanced Practitioner's note, impression and recommendations. Multifactorial anemia with occult blood in stool. Prolonged vent with trach placed yesterday. Long term enteral feedings are anticipated. He is not stable enough for PEG at this time due to recent labile temperatures and hypotension following trach yesterday. Recommend enteral Panda tube feeding for now.   Pricilla Riffle  Fuller Plan, MD [facility]

## 2014-01-31 NOTE — Progress Notes (Signed)
Patient ID: Allen Wood, male   DOB: [birth year], 44 y.o.   MRN: [id number]  [facility] Progress Note    Assessment/ Plan:   1. AKI: likely from ATN of sepsis associated with strep PNA. Remains anuric without any evident renal recovery-plan to continue CRRT for at least another 24 hours and consider switch to intermittent hemodialysis. Attempt ultrafiltration net -50 mL an hour if possible 2. Strep PNA/VDRF: Ongoing chronic wean efforts now status post tracheostomy 3. H/O EtOH abuse 4. H/O depression  Subjective:   Underwent tracheostomy yesterday-intermittently hypotensive overnight requiring fluid bolus    Objective:   BP 99/64 mmHg  Pulse 74  Temp(Src) 97.8 F (36.6 C) (Oral)  Resp 16  Ht 5\' 11"  (1.803 m)  Wt 75.6 kg (166 lb 10.7 oz)  BMI 23.26 kg/m2  SpO2 100%  Intake/Output Summary (Last 24 hours) at 01/31/14 0819 Last data filed at 01/31/14 0700  Gross per 24 hour  Intake 1363.85 ml  Output   1869 ml  Net -505.15 ml   Weight change: 0.4 kg (14.1 oz)  Physical Exam: Gen: Appears to be comfortable CVS: Pulse regular in rate and rhythm, S1 and S2 normal Resp: Anteriorly clear to auscultation, no rales Abd: Soft, flat, nontender Ext: No lower extremity edema. 2+ upper extremity edema.  Imaging: Dg Chest Port 1 View  01/30/2014   CLINICAL DATA:  Post tracheostomy  EXAM: PORTABLE CHEST - 1 VIEW  COMPARISON:  01/29/2014  FINDINGS: Cardiomediastinal silhouette is stable. Tracheostomy tube in place. No pneumothorax. Stable right subclavian central line position. Stable left IJ central line catheter with tip in distal SVC. NG tube is unchanged in position. Central mild vascular congestion and mild perihilar interstitial prominence without significant change from prior exam. Stable small right pleural effusion with right lower lobe atelectasis or infiltrate. There is small loculated left basilar pleural effusion with left basilar atelectasis. Small amount of fluid  along the left major fissure or partial atelectasis left upper lobe.  IMPRESSION: Tracheostomy tube in place. No pneumothorax. Stable right subclavian and left IJ central line position. Central mild vascular congestion and mild perihilar interstitial prominence without change from prior exam. Stable small right pleural effusion with right lower lobe atelectasis or infiltrate. There is small loculated left basilar pleural effusion with left basilar atelectasis. Small amount of fluid along the left major fissure or partial atelectasis left upper lobe.   Electronically Signed   By: Lahoma Crocker M.D.   On: 01/30/2014 14:03   Dg Abd Portable 1v  01/30/2014   CLINICAL DATA:  Encounter for feeding tube placement.  EXAM: PORTABLE ABDOMEN - 1 VIEW  COMPARISON:  January 20, 2014.  FINDINGS: Distal tip of feeding tube is seen in lower abdomen most consistent with distal stomach. No abnormal bowel gas pattern is noted. No abnormal calcifications are noted.  IMPRESSION: Distal tip of feeding tube is seen in lower abdomen consistent with distal portion of low lying stomach.   Electronically Signed   By: Sabino Dick M.D.   On: 01/30/2014 13:46    Labs: BMET  Recent Labs Lab 01/28/14 0418 01/28/14 1650 01/29/14 0350 01/29/14 1600 01/30/14 0413 01/30/14 1552 01/31/14 0223  NA 135 133* 137  137 136 137 135 137  K 4.0 4.0 3.9  3.9 3.9 3.6 4.3 4.0  CL 103 102 105  105 104 104 104 106  CO2 20 25 25  26 27 28 27 25   GLUCOSE 101* 113* 109*  110* 119* 127* 107*  102*  BUN 31* 24* 19  19 14 10 12 9   CREATININE 1.55* 1.54* 1.46*  1.48* 1.37* 1.19 1.53* 1.38*  CALCIUM 7.1* 7.5* 7.6*  7.6* 7.6* 7.9* 7.7* 7.5*  PHOS 5.0* 4.1  4.0 3.2 2.7 2.9 3.0 2.8   CBC  Recent Labs Lab 01/27/14 0539 01/28/14 0418 01/29/14 0350 01/30/14 0705 01/31/14 0223  WBC 10.3 9.7 10.8* 10.9* 12.7*  NEUTROABS 7.5  --   --   --   --   HGB 6.4* 8.0* 7.4* 7.8* 7.0*  HCT 19.9* 24.5* 22.8* 23.9* 21.2*  MCV 107.6* 101.7* 101.3*  101.7* 104.4*  PLT 796* 753* 757* 767* 641*   Medications:    . antiseptic oral rinse  7 mL Mouth Rinse QID  . chlorhexidine  15 mL Mouth Rinse BID  . folic acid  1 mg Oral Daily  . guaiFENesin  20 mL Per Tube 6 times per day  . ipratropium-albuterol  3 mL Nebulization Q6H  . multivitamin  5 mL Per Tube Daily  . pantoprazole (PROTONIX) IV  40 mg Intravenous Q12H  . risperiDONE  2 mg Oral BID  . thiamine  100 mg Oral Daily   Or  . thiamine  100 mg Intravenous Daily   Elmarie Shiley, MD 01/31/2014, 8:19 AM

## 2014-01-31 NOTE — Progress Notes (Addendum)
ANTIBIOTIC CONSULT NOTE - INITIAL  Pharmacy Consult for ceftazidime Indication: R/O PNA  No Known Allergies  Patient Measurements: Height: 5\' 11"  (180.3 cm) Weight: 166 lb 10.7 oz (75.6 kg) IBW/kg (Calculated) : 75.3  Vital Signs: Temp: 97.8 F (36.6 C) (01/06 0441) Temp Source: Oral (01/06 0441) BP: 113/65 mmHg (01/06 0838) Pulse Rate: 102 (01/06 0838) Intake/Output from previous day: 01/05 0701 - 01/06 0700 In: 1463.2 [I.V.:1463.2] Out: 2132 [Urine:75] Intake/Output from this shift:    Labs:  Recent Labs  01/29/14 0350  01/30/14 0413 01/30/14 0705 01/30/14 1552 01/31/14 0223  WBC 10.8*  --   --  10.9*  --  12.7*  HGB 7.4*  --   --  7.8*  --  7.0*  PLT 757*  --   --  767*  --  641*  CREATININE 1.46*  1.48*  < > 1.19  --  1.53* 1.38*  < > = values in this interval not displayed. Estimated Creatinine Clearance: 72 mL/min (by C-G formula based on Cr of 1.38). No results for input(s): VANCOTROUGH, VANCOPEAK, VANCORANDOM, GENTTROUGH, GENTPEAK, GENTRANDOM, TOBRATROUGH, TOBRAPEAK, TOBRARND, AMIKACINPEAK, AMIKACINTROU, AMIKACIN in the last 72 hours.   Microbiology: Recent Results (from the past 720 hour(s))  Blood culture (routine x 2)     Status: None   Collection Time: 01/15/14  4:09 PM  Result Value Ref Range Status   Specimen Description BLOOD RIGHT ANTECUBITAL  Final   Special Requests BOTTLES DRAWN AEROBIC AND ANAEROBIC 3 ML  Final   Culture  Setup Time   Final    01/16/2014 01:05 Performed at [facility]    Culture   Final    STREPTOCOCCUS PNEUMONIAE Note: Gram Stain Report Called to,Read Back By and Verified With: ANGELA ASHLEY 01/16/14 1550 BY SMITHERSJ Performed at [facility]    Report Status 01/30/2014 FINAL  Final   Organism ID, Bacteria STREPTOCOCCUS PNEUMONIAE  Final      Susceptibility   Streptococcus pneumoniae - MIC (ETEST)*    CEFTRIAXONE 0.023 SENSITIVE Sensitive     LEVOFLOXACIN 0.75 SENSITIVE Sensitive     PENICILLIN  0.023 SENSITIVE Sensitive     * STREPTOCOCCUS PNEUMONIAE  Blood culture (routine x 2)     Status: None   Collection Time: 01/15/14  4:09 PM  Result Value Ref Range Status   Specimen Description BLOOD BLOOD LEFT FOREARM  Final   Special Requests BOTTLES DRAWN AEROBIC AND ANAEROBIC 5 ML  Final   Culture  Setup Time   Final    01/16/2014 01:05 Performed at [facility]    Culture   Final    STREPTOCOCCUS PNEUMONIAE Note: SUSCEPTIBILITIES PERFORMED ON PREVIOUS CULTURE WITHIN THE LAST 5 DAYS. Note: Gram Stain Report Called to,Read Back By and Verified With: ANGELA ASHLEY 01/16/14 1550 BY SMITHERSJ Performed at [facility]    Report Status 01/30/2014 FINAL  Final  MRSA PCR Screening     Status: None   Collection Time: 01/15/14  8:24 PM  Result Value Ref Range Status   MRSA by PCR NEGATIVE NEGATIVE Final    Comment:        The GeneXpert MRSA Assay (FDA approved for NASAL specimens only), is one component of a comprehensive MRSA colonization surveillance program. It is not intended to diagnose MRSA infection nor to guide or monitor treatment for MRSA infections.   Culture, sputum-assessment     Status: None   Collection Time: 01/15/14  8:40 PM  Result Value Ref Range Status  Specimen Description SPUTUM  Final   Special Requests NONE  Final   Sputum evaluation   Final    THIS SPECIMEN IS ACCEPTABLE. RESPIRATORY CULTURE REPORT TO FOLLOW.   Report Status 01/15/2014 FINAL  Final  Culture, respiratory (NON-Expectorated)     Status: None   Collection Time: 01/15/14  8:40 PM  Result Value Ref Range Status   Specimen Description SPUTUM  Final   Special Requests NONE  Final   Gram Stain   Final    FEW WBC PRESENT,BOTH PMN AND MONONUCLEAR RARE SQUAMOUS EPITHELIAL CELLS PRESENT FEW GRAM POSITIVE COCCI IN PAIRS Performed at [facility]    Culture   Final    MODERATE STREPTOCOCCUS PNEUMONIAE Performed at [facility]    Report Status 01/19/2014  FINAL  Final   Organism ID, Bacteria STREPTOCOCCUS PNEUMONIAE  Final      Susceptibility   Streptococcus pneumoniae - MIC (ETEST)*    CEFTRIAXONE 0.016 SENSITIVE Sensitive     LEVOFLOXACIN 0.75 SENSITIVE Sensitive     PENICILLIN 0.023 SENSITIVE Sensitive     * MODERATE STREPTOCOCCUS PNEUMONIAE  Culture, respiratory (NON-Expectorated)     Status: None   Collection Time: 01/16/14 10:11 AM  Result Value Ref Range Status   Specimen Description TRACHEAL ASPIRATE  Final   Special Requests Normal  Final   Gram Stain   Final    MODERATE WBC PRESENT, PREDOMINANTLY PMN NO SQUAMOUS EPITHELIAL CELLS SEEN FEW GRAM NEGATIVE COCCOBACILLI Performed at [facility]    Culture   Final    MODERATE STREPTOCOCCUS PNEUMONIAE Performed at [facility]    Report Status 01/20/2014 FINAL  Final   Organism ID, Bacteria STREPTOCOCCUS PNEUMONIAE  Final      Susceptibility   Streptococcus pneumoniae - MIC (ETEST)*    CEFTRIAXONE 0.023 SENSITIVE Sensitive     LEVOFLOXACIN 1.0 SENSITIVE Sensitive     PENICILLIN 0.023 SENSITIVE Sensitive     * MODERATE STREPTOCOCCUS PNEUMONIAE  Culture, Urine     Status: None   Collection Time: 01/16/14 10:48 PM  Result Value Ref Range Status   Specimen Description URINE, CATHETERIZED  Final   Special Requests NONE  Final   Culture  Setup Time   Final    01/17/2014 11:37 Performed at [place] Performed at [facility]   Final   Culture NO GROWTH Performed at [facility]   Final   Report Status 01/18/2014 FINAL  Final  Body fluid culture     Status: None   Collection Time: 01/23/14 12:14 PM  Result Value Ref Range Status   Specimen Description PLEURAL RIGHT  Final   Special Requests NONE  Final   Gram Stain   Final    CYTOSPIN SLIDE WBC PRESENT,BOTH PMN AND MONONUCLEAR NO ORGANISMS SEEN Performed at [facility]    Culture   Final    NO GROWTH 3 DAYS Performed at [facility]    Report Status 01/28/2014 FINAL  Final  AFB culture with smear     Status: None (Preliminary result)   Collection Time: 01/23/14 12:14 PM  Result Value Ref Range Status   Specimen Description PLEURAL RIGHT  Final   Special Requests NONE  Final   Acid Fast Smear   Final    NO ACID FAST BACILLI SEEN Performed at [facility]    Culture   Final    CULTURE WILL BE EXAMINED FOR 6 WEEKS  BEFORE ISSUING A FINAL REPORT Performed at [facility]    Report Status PENDING  Incomplete  Fungus Culture with Smear     Status: None (Preliminary result)   Collection Time: 01/23/14 12:14 PM  Result Value Ref Range Status   Specimen Description PLEURAL RIGHT  Final   Special Requests NONE  Final   Fungal Smear   Final    NO YEAST OR FUNGAL ELEMENTS SEEN Performed at [facility]    Culture   Final    CULTURE IN PROGRESS FOR FOUR WEEKS Performed at [facility]    Report Status PENDING  Incomplete  Culture, blood (routine x 2)     Status: None   Collection Time: 01/24/14 10:00 AM  Result Value Ref Range Status   Specimen Description BLOOD LEFT WRIST  Final   Special Requests BOTTLES DRAWN AEROBIC ONLY 6CC  Final   Culture   Final    NO GROWTH 5 DAYS Performed at [facility]    Report Status 01/30/2014 FINAL  Final  Culture, blood (routine x 2)     Status: None   Collection Time: 01/24/14 10:10 AM  Result Value Ref Range Status   Specimen Description BLOOD RIGHT ARM  Final   Special Requests BOTTLES DRAWN AEROBIC AND ANAEROBIC 10CC  Final   Culture   Final    NO GROWTH 5 DAYS Note: Culture results may be compromised due to an inadequate volume of blood received in culture bottles. Performed at [facility]    Report Status 01/30/2014 FINAL  Final  Urine culture     Status: None   Collection Time: 01/24/14  5:04 PM  Result Value Ref Range Status   Specimen Description URINE, CATHETERIZED  Final   Special Requests NONE  Final    Colony Count NO GROWTH Performed at [facility]   Final   Culture NO GROWTH Performed at [facility]   Final   Report Status 01/28/2014 FINAL  Final  Culture, respiratory (NON-Expectorated)     Status: None   Collection Time: 01/25/14 12:11 AM  Result Value Ref Range Status   Specimen Description TRACHEAL ASPIRATE  Final   Special Requests NONE  Final   Gram Stain   Final    ABUNDANT WBC PRESENT,BOTH PMN AND MONONUCLEAR RARE SQUAMOUS EPITHELIAL CELLS PRESENT NO ORGANISMS SEEN Performed at [facility]    Culture   Final    Non-Pathogenic Oropharyngeal-type Flora Isolated. Performed at [facility]    Report Status 01/28/2014 FINAL  Final    Medical History: Past Medical History  Diagnosis Date  . Alcohol abuse   . Depression   . Anxiety   . Pneumonia    Assessment: 44 yo M admitted several weeks ago with VDRF secondary to RLL strep pneumo PNA. Finished course of abx on 1/4. Now spiked a new fever on 1/5 of 102.1. WBC up to 12.7 from 10.9. Currently on CRRT. UA, Blood cx, and c. diff pending  Goal of Therapy:  Eradication of infection  Plan:  Start ceftazidime 2g IV Q12 Monitor fever trend, renal function, clinical picture  Florentino Laabs J 01/31/2014,8:50 AM  Pharmacy to dose IV vancomycin as well for worsening clinical picture. On CRRT.  Goal of Therapy: Goal vanc trough of 15-20  Give vancomycin 1500mg  IV x 1, then continue vancomycin 750mg  IV Q24

## 2014-02-01 DIAGNOSIS — M7989 Other specified soft tissue disorders: Secondary | ICD-10-CM

## 2014-02-01 LAB — GLUCOSE, CAPILLARY
GLUCOSE-CAPILLARY: 125 mg/dL — AB (ref 70–99)
Glucose-Capillary: 106 mg/dL — ABNORMAL HIGH (ref 70–99)
Glucose-Capillary: 108 mg/dL — ABNORMAL HIGH (ref 70–99)
Glucose-Capillary: 83 mg/dL (ref 70–99)
Glucose-Capillary: 84 mg/dL (ref 70–99)
Glucose-Capillary: 92 mg/dL (ref 70–99)

## 2014-02-01 LAB — MAGNESIUM: Magnesium: 2.1 mg/dL (ref 1.5–2.5)

## 2014-02-01 LAB — RENAL FUNCTION PANEL
ANION GAP: 4 — AB (ref 5–15)
Albumin: 1.4 g/dL — ABNORMAL LOW (ref 3.5–5.2)
Albumin: 1.5 g/dL — ABNORMAL LOW (ref 3.5–5.2)
Anion gap: 8 (ref 5–15)
BUN: 8 mg/dL (ref 6–23)
BUN: 7 mg/dL (ref 6–23)
CALCIUM: 7.8 mg/dL — AB (ref 8.4–10.5)
CO2: 26 mmol/L (ref 19–32)
CO2: 22 mmol/L (ref 19–32)
CREATININE: 1.29 mg/dL (ref 0.50–1.35)
Calcium: 7.9 mg/dL — ABNORMAL LOW (ref 8.4–10.5)
Chloride: 107 mEq/L (ref 96–112)
Chloride: 106 mEq/L (ref 96–112)
Creatinine, Ser: 1.41 mg/dL — ABNORMAL HIGH (ref 0.50–1.35)
GFR calc Af Amer: 76 mL/min — ABNORMAL LOW (ref >90)
GFR calc non Af Amer: 59 mL/min — ABNORMAL LOW (ref >90)
GFR, EST AFRICAN AMERICAN: 68 mL/min — AB (ref >90)
GFR, EST NON AFRICAN AMERICAN: 66 mL/min — AB (ref >90)
Glucose, Bld: 104 mg/dL — ABNORMAL HIGH (ref 70–99)
Glucose, Bld: 124 mg/dL — ABNORMAL HIGH (ref 70–99)
PHOSPHORUS: 3.4 mg/dL (ref 2.3–4.6)
POTASSIUM: 3.9 mmol/L (ref 3.5–5.1)
Phosphorus: 3.4 mg/dL (ref 2.3–4.6)
Potassium: 4.3 mmol/L (ref 3.5–5.1)
Sodium: 137 mmol/L (ref 135–145)
Sodium: 136 mmol/L (ref 135–145)

## 2014-02-01 LAB — PROCALCITONIN: PROCALCITONIN: 0.4 ng/mL

## 2014-02-01 LAB — APTT: aPTT: 46 seconds — ABNORMAL HIGH (ref 24–37)

## 2014-02-01 LAB — VISCOSITY, SERUM: VISCOSITY, SERUM: 1.8 rel to H2O (ref 1.5–1.9)

## 2014-02-01 MED ORDER — FENTANYL 25 MCG/HR TD PT72
75.0000 ug | MEDICATED_PATCH | TRANSDERMAL | Status: DC
  Administered 2014-02-01 – 2014-02-07 (×3): 75 ug via TRANSDERMAL
  Filled 2014-02-01: qty 3
  Filled 2014-02-01 (×2): qty 1

## 2014-02-01 MED ORDER — VITAL HIGH PROTEIN PO LIQD
1000.0000 mL | ORAL | Status: AC
  Administered 2014-02-01: 1000 mL
  Filled 2014-02-01 (×4): qty 1000

## 2014-02-01 MED ORDER — METOCLOPRAMIDE HCL 5 MG/5ML PO SOLN
10.0000 mg | Freq: Four times a day (QID) | ORAL | Status: DC
  Administered 2014-02-01 – 2014-02-03 (×7): 10 mg via ORAL
  Filled 2014-02-01 (×11): qty 10

## 2014-02-01 MED ORDER — PANTOPRAZOLE SODIUM 40 MG PO PACK: 40.0000 mg | PACK | Freq: Every day | ORAL | Status: DC

## 2014-02-01 MED ORDER — METOCLOPRAMIDE HCL 5 MG/5ML PO SOLN
5.0000 mg | Freq: Two times a day (BID) | ORAL | Status: DC
  Administered 2014-02-01: 5 mg via ORAL
  Filled 2014-02-01 (×2): qty 5

## 2014-02-01 MED ORDER — SODIUM CHLORIDE 0.9 % IV SOLN
250.0000 mg | INTRAVENOUS | Status: DC
  Administered 2014-02-01 – 2014-02-04 (×3): 250 mg via INTRAVENOUS
  Filled 2014-02-01 (×6): qty 20

## 2014-02-01 MED ORDER — DARBEPOETIN ALFA 60 MCG/0.3ML IJ SOSY
60.0000 ug | PREFILLED_SYRINGE | INTRAMUSCULAR | Status: DC
  Administered 2014-02-02: 60 ug via SUBCUTANEOUS
  Filled 2014-02-01 (×2): qty 0.3

## 2014-02-01 NOTE — Progress Notes (Signed)
UR Completed.  [phone]  

## 2014-02-01 NOTE — Trach Care Team (Signed)
[facility] Progression Note   Patient Details Name: Allen Wood MRN: [id number] DOB: 15-Apr-1970 Today's Date: 02/01/2014   Tracheostomy Assessment    Tracheostomy Shiley 8 mm Cuffed (Active)  Status Secured 02/01/2014 12:23 PM  Site Assessment Clean;Dry 02/01/2014 12:23 PM  Site Care Cleansed;Dried 01/31/2014 12:50 PM  Inner Cannula Care Changed/new 01/31/2014 12:50 PM  Ties Assessment Secure 02/01/2014 12:23 PM  Cuff pressure (cm) 30 cm 01/31/2014 11:24 PM  Emergency Equipment at bedside Yes 02/01/2014 12:23 PM     Care Needs Suture removal post-op day #7 : 02/06/14   Respiratory Therapy O2 Device: Ventilator FiO2 (%): 50 % SpO2: 100 % Education:  (none needed at this time) Follow up recommendations:  (will continue to follow for pt progress) Respiratory barriers to progression:  (pt remains on full vent support )    Speech Language Pathology  SLP chart review complete: Patient not ready for SLP services (weaning)   Physical Therapy      Occupational Therapy      Nutritional Patient's Current Diet: Tube feeding Tube Feeding: Vital AF 1.2 Cal Tube Feeding Frequency: Continuous Tube Feeding Strength: Full strength    Case Management/Social Work Level of patient care prior to hospitalization: Home-Self care Living status: Family (document relation) Insurance payer: Medicaid Barriers to progression: clinical Anticipated discharge disposition: SNF/Assisted  living facility (still to be determined)    Provider [place] Team/Provider Recommendations [place] Team Members Present-  Ciro Backer, RT, Alvino Blood, SW, Molli Barrows, RD, Herbie [place], SLP, Deveron Furlong, [place], NP New trach - Continue to follow.           Braxton Vantrease, Jaci Carrel (scribe for team) 02/01/2014, 1:32 PM

## 2014-02-01 NOTE — Progress Notes (Signed)
Trach consult note:  Pt seen at this time for trach consult.  Pt remains on full vent support.  No education needed. Will continue to follow for pt progress.  Sutures remain in at this time.

## 2014-02-01 NOTE — Progress Notes (Signed)
VASCULAR LAB PRELIMINARY  PRELIMINARY  PRELIMINARY  PRELIMINARY  Upper Right Extremity Venous Duplex completed.    Preliminary report:  Somewhat limited exam due to right subclavian port and trachea.    NEG. DVT or superficial thrombus right upper extremity.  Lindwood Coke, RVT 02/01/2014, 12:52 PM

## 2014-02-01 NOTE — Progress Notes (Signed)
Daily Rounding Note  02/01/2014, 8:26 AM  LOS: 17 days   SUBJECTIVE:       High gastric residuals.  200 cc for night shift, 370 cc this AM.  Reglan initiated but TF remain at 75 ml/hour.   Episodes of hypotension associated with administration of precedex and benzos to relieve agitation.  Stools are loose, flexiseal still in place.   OBJECTIVE:         Vital signs in last 24 hours:    Temp:  [90.1 F (32.3 C)-98.6 F (37 C)] 97.8 F (36.6 C) (01/07 0356) Pulse Rate:  [51-149] 67 (01/07 0745) Resp:  [12-34] 16 (01/07 0745) BP: (68-133)/(39-110) 114/58 mmHg (01/07 0700) SpO2:  [88 %-100 %] 100 % (01/07 0759) FiO2 (%):  [40 %-60 %] 50 % (01/07 0754) Weight:  [167 lb 8.8 oz (76 kg)] 167 lb 8.8 oz (76 kg) (01/07 0344) Last BM Date: 01/31/14 Filed Weights   01/30/14 0400 01/31/14 0348 02/01/14 0344  Weight: 165 lb 12.6 oz (75.2 kg) 166 lb 10.7 oz (75.6 kg) 167 lb 8.8 oz (76 kg)   General: sedated on vent via trach   Heart: RRR.  No mrg Chest: clear in front.  No labored breathing Abdomen: soft, active BS, NT, ND  Extremities: edema in upper limbs, none in feet or legs Neuro/Psych:  Unable to assess due to sedation.   Intake/Output from previous day: 01/06 0701 - 01/07 0700 In: 3349 [I.V.:1111.5; NG/GT:480; IV Piggyback:1757.5] Out: 2875 [Urine:101; Stool:350]  Intake/Output this shift: Total I/O In: -  Out: 103 [Other:103]  Lab Results:  Recent Labs  01/30/14 0705 01/31/14 0223  WBC 10.9* 12.7*  HGB 7.8* 7.0*  HCT 23.9* 21.2*  PLT 767* 641*   BMET  Recent Labs  01/31/14 0223 01/31/14 1530 02/01/14 0605  NA 137 138 137  K 4.0 4.3 3.9  CL 106 106 107  CO2 25 23 26   GLUCOSE 102* 93 104*  BUN 9 9 8   CREATININE 1.38* 1.38* 1.41*  CALCIUM 7.5* 7.7* 7.9*   LFT  Recent Labs  01/31/14 0223 01/31/14 1530 02/01/14 0605  ALBUMIN 1.3* 1.5* 1.4*   PT/INR  Recent Labs  01/29/14 1330  LABPROT  15.5*  INR 1.21    Studies/Results: Dg Chest Port 1 View  01/31/2014   CLINICAL DATA:  Sepsis.  Community acquired pneumonia.  EXAM: PORTABLE CHEST - 1 VIEW  COMPARISON:  01/30/2014  FINDINGS: Support lines and tubes remain in appropriate position. Right mid and lower lung airspace disease and small right pleural effusion show no significant change.  There has been decrease in atelectasis in the left upper lobe since previous study. Increased airspace disease is seen in the left lung base.  Heart size remains normal.  No pneumothorax visualized.  IMPRESSION: Decreased left upper lobe atelectasis, but increased in airspace disease in left lung base.  No significant change in right mid and lower lung airspace disease and small pleural effusion.   Electronically Signed   By: Earle Gell M.D.   On: 01/31/2014 11:37   Dg Chest Port 1 View  01/30/2014   CLINICAL DATA:  Post tracheostomy  EXAM: PORTABLE CHEST - 1 VIEW  COMPARISON:  01/29/2014  FINDINGS: Cardiomediastinal silhouette is stable. Tracheostomy tube in place. No pneumothorax. Stable right subclavian central line position. Stable left IJ central line catheter with tip in distal SVC. NG tube is unchanged in position. Central mild vascular congestion and mild perihilar  interstitial prominence without significant change from prior exam. Stable small right pleural effusion with right lower lobe atelectasis or infiltrate. There is small loculated left basilar pleural effusion with left basilar atelectasis. Small amount of fluid along the left major fissure or partial atelectasis left upper lobe.  IMPRESSION: Tracheostomy tube in place. No pneumothorax. Stable right subclavian and left IJ central line position. Central mild vascular congestion and mild perihilar interstitial prominence without change from prior exam. Stable small right pleural effusion with right lower lobe atelectasis or infiltrate. There is small loculated left basilar pleural effusion with left  basilar atelectasis. Small amount of fluid along the left major fissure or partial atelectasis left upper lobe.   Electronically Signed   By: Lahoma Crocker M.D.   On: 01/30/2014 14:03   Dg Abd Portable 1v  01/31/2014   CLINICAL DATA:  Abdominal distension  EXAM: PORTABLE ABDOMEN - 1 VIEW  COMPARISON:  Portable exam 0959 hr compared to 01/30/2014  FINDINGS: Tip of feeding tube projects over distal gastric antrum.  Nonobstructive bowel gas pattern.  No bowel dilatation or bowel wall thickening.  Osseous structures unremarkable.  Pleural effusion identified at RIGHT lung base with additional bibasilar opacities which could represent atelectasis or infiltrate.  IMPRESSION: RIGHT pleural effusion with question bibasilar atelectasis versus infiltrate.  Nonobstructive bowel gas pattern.  Tip of feeding tube projects over distal gastric antrum.   Electronically Signed   By: Lavonia Dana M.D.   On: 01/31/2014 10:14   Dg Abd Portable 1v  01/30/2014   CLINICAL DATA:  Encounter for feeding tube placement.  EXAM: PORTABLE ABDOMEN - 1 VIEW  COMPARISON:  January 20, 2014.  FINDINGS: Distal tip of feeding tube is seen in lower abdomen most consistent with distal stomach. No abnormal bowel gas pattern is noted. No abnormal calcifications are noted.  IMPRESSION: Distal tip of feeding tube is seen in lower abdomen consistent with distal portion of low lying stomach.   Electronically Signed   By: Sabino Dick M.D.   On: 01/30/2014 13:46   Scheduled Meds: . antiseptic oral rinse  7 mL Mouth Rinse QID  . cefTAZidime (FORTAZ)  IV  2 g Intravenous Q12H  . chlorhexidine  15 mL Mouth Rinse BID  . [START ON 02/02/2014] darbepoetin (ARANESP) injection - NON-DIALYSIS  60 mcg Subcutaneous Q Fri-1800  . fentaNYL  75 mcg Transdermal Q72H  . ferric gluconate (FERRLECIT/NULECIT) IV  250 mg Intravenous Q T,Th,S,Su  . folic acid  1 mg Oral Daily  . ipratropium-albuterol  3 mL Nebulization Q6H  . LORazepam  4 mg Intravenous Q4H  .  metoCLOPramide  5 mg Oral BID  . multivitamin  5 mL Per Tube Daily  . pantoprazole sodium  40 mg Per Tube BID  . [START ON 02/15/2014] pantoprazole sodium  40 mg Per Tube Daily  . risperiDONE  2 mg Oral BID  . sertraline  50 mg Oral Daily  . thiamine  100 mg Oral Daily   Or  . thiamine  100 mg Intravenous Daily  . valproate sodium  250 mg Intravenous 3 times per day  . vancomycin  750 mg Intravenous Q24H   Continuous Infusions: . sodium chloride 10 mL/hr at 01/31/14 1319  . dexmedetomidine 1.8 mcg/kg/hr (02/01/14 0919)  . feeding supplement (VITAL AF 1.2 CAL) 1,000 mL (01/31/14 1752)  . fentaNYL infusion INTRAVENOUS 250 mcg/hr (02/01/14 1059)  . midazolam (VERSED) infusion 7 mg/hr (02/01/14 1059)  . phenylephrine (NEO-SYNEPHRINE) Adult infusion 50 mcg/min (02/01/14 0500)  .  dialysis replacement fluid (prismasate) 200 mL/hr at 02/01/14 0200  . dialysis replacement fluid (prismasate) 200 mL/hr at 01/30/14 4627  . dialysate (PRISMASATE) 1,500 mL/hr at 02/01/14 0832   PRN Meds:.acetaminophen (TYLENOL) oral liquid 160 mg/5 mL, bisacodyl, docusate **OR** docusate, haloperidol lactate, heparin, heparin, labetalol, midazolam   ASSESMENT:   * Prolonged vent/trach requiring strep + CAP. Now needs PEG for long term access for tube feeds. Has high gastric residuals but able to continue on gaol rate TF at 75 per hour.  No immediate plans to place PEG  *  Hypothermia and fevers resolved.      * Macrocytic anemia in setting of acute, critical illness: causes are multifactorial. Transfused on 1/2. FOBT +: Rule out gastritis, ulcers, vascular malformations. Thus far no evidence of chronic liver disease/cirrhosis which would add portal htn gastropathy and varices to potential causes. Stools non bloody but diarrheal: rule out colitis. He is C diff negative.   On Aranesp.   Hgb dropping.   * Anuric ARF requiring CRRT.  * Fever and hypothermia. Hypotension.   * Alcoholism.     PLAN    *  Per Dr Fuller Plan.   Azucena Freed  02/01/2014, 8:26 AM Pager: [phone]     Attending physician's note   I have taken an interval history, reviewed the chart and examined the patient. I agree with the Advanced Practitioner's note, impression and recommendations. He is not yet stable enough for PEG placement. Continue TF with Reglan for high residuals. May need to decrease TF rate if residuals remain high. Please contact us early next week if you feel he has improved enough to reconsider PEG.   Pricilla Riffle. Fuller Plan, MD [facility]

## 2014-02-01 NOTE — Progress Notes (Signed)
PULMONARY / CRITICAL CARE MEDICINE   Name: Allen Wood MRN: [id number] DOB: [birth year]    ADMISSION DATE:  01/15/2014 CONSULTATION DATE:  01/16/14  REFERRING MD :  Dr. Grandville Silos   CHIEF COMPLAINT:  Acute Respiratory Failure   INITIAL PRESENTATION: 44 y/o M, smoker / ETOH abuse, admitted 12/21 with ongoing fatigue, cough with productive yellow sputum, R posterior back pain, chills & sweats.  Work up found patient to have RLL PNA with positive strep antigen.  Developed worsening respiratory failure 12/22  STUDIES:  12/24 EEG >> neg 1/1 Echo >EF 60%, no wall abn,   SIGNIFICANT EVENTS: 12/21  Admit with RLL PNA 12/22  Decompensated, required intubation for respiratory failure in setting of PNA 12/23  Stable on vent, increased pleural effusion on CXR.  Levo @ 2 mcg, Fent 114mcg, Precedex 0.6.  Seizures overnight, Tmax 105 12/24  Concern for ileus with TF's coming out of mouth overnight.  Reglan dosing 12/26  Reintubated due to inability to clear secretions 12/30 Cr rising, UOP dropping. 12/31 Cr up to 1.9, UOP 250 ml over 24 hours, renal consult called 1/1 transfused 1 U pRBC;  1/1 Transferred to [facility] , CRRT per renal  1/2 Transfused 1 U pRBC 1/5 Trach placed 1/6 Hypotension, on Neo, fevers  SUBJECTIVE:  Afebrile, no hypothermia overnight Required Neo overnight Bradycardic in 50-60s this AM Sedated, easily agitated  Trach in place Pos finally 40 cc   VITAL SIGNS: Temp:  [90.1 F (32.3 C)-98.6 F (37 C)] 97.8 F (36.6 C) (01/07 0356) Pulse Rate:  [44-149] 60 (01/07 0615) Resp:  [12-34] 16 (01/07 0615) BP: (68-136)/(39-110) 113/79 mmHg (01/07 0600) SpO2:  [88 %-100 %] 100 % (01/07 0615) FiO2 (%):  [40 %-60 %] 50 % (01/07 0252) Weight:  [76 kg (167 lb 8.8 oz)] 76 kg (167 lb 8.8 oz) (01/07 0344)   HEMODYNAMICS: CVP:  [6 mmHg] 6 mmHg   VENTILATOR SETTINGS: Vent Mode:  [-] PRVC FiO2 (%):  [40 %-60 %] 50 % Set Rate:  [16 bmp] 16 bmp Vt Set:  [600 mL] 600 mL PEEP:   [5 cmH20] 5 cmH20 Pressure Support:  [8 cmH20] 8 cmH20 Plateau Pressure:  [17 cmH20-18 cmH20] 18 cmH20   INTAKE / OUTPUT:  Intake/Output Summary (Last 24 hours) at 02/01/14 0635 Last data filed at 02/01/14 0600  Gross per 24 hour  Intake 3235.67 ml  Output   2909 ml  Net 326.67 ml   PHYSICAL EXAMINATION: General: sedated on vent, rass -4 HEENT: NCAT, EOMI PULM: crackles bilaterally, no wheezing, trach CV: RRR, no mgr Ab: BS+, soft, nontender Ext: anasarca noted throughout Neuro: sedated on vent, intermittently agitated  LABS:  CBC  Recent Labs Lab 01/29/14 0350 01/30/14 0705 01/31/14 0223  WBC 10.8* 10.9* 12.7*  HGB 7.4* 7.8* 7.0*  HCT 22.8* 23.9* 21.2*  PLT 757* 767* 641*   BMET  Recent Labs Lab 01/30/14 1552 01/31/14 0223 01/31/14 1530  NA 135 137 138  K 4.3 4.0 4.3  CL 104 106 106  CO2 27 25 23   BUN 12 9 9   CREATININE 1.53* 1.38* 1.38*  GLUCOSE 107* 102* 93   Electrolytes  Recent Labs Lab 01/29/14 0350  01/30/14 0413 01/30/14 1552 01/31/14 0223 01/31/14 1530  CALCIUM 7.6*  7.6*  < > 7.9* 7.7* 7.5* 7.7*  MG 2.1  --  2.2  --  2.2  --   PHOS 3.2  < > 2.9 3.0 2.8 3.5  < > = values in this interval not  displayed. Sepsis Markers  Recent Labs Lab 01/31/14 0944 01/31/14 1100  LATICACIDVEN  --  0.9  PROCALCITON 0.44  --    ABG  Recent Labs Lab 01/26/14 0433 01/29/14 0500 01/31/14 1041  PHART 7.378 7.436 7.417  PCO2ART 41.3 38.3 34.1*  PO2ART 73.0* 92.9 40.0*   Liver Enzymes  Recent Labs Lab 01/30/14 1552 01/31/14 0223 01/31/14 1530  ALBUMIN 1.5* 1.3* 1.5*   Glucose  Recent Labs Lab 01/31/14 0758 01/31/14 1112 01/31/14 1530 01/31/14 2002 01/31/14 2337 02/01/14 0353  GLUCAP 89 104* 86 85 84 83    Imaging Dg Chest Port 1 View  01/31/2014   CLINICAL DATA:  Sepsis.  Community acquired pneumonia.  EXAM: PORTABLE CHEST - 1 VIEW  COMPARISON:  01/30/2014  FINDINGS: Support lines and tubes remain in appropriate position.  Right mid and lower lung airspace disease and small right pleural effusion show no significant change.  There has been decrease in atelectasis in the left upper lobe since previous study. Increased airspace disease is seen in the left lung base.  Heart size remains normal.  No pneumothorax visualized.  IMPRESSION: Decreased left upper lobe atelectasis, but increased in airspace disease in left lung base.  No significant change in right mid and lower lung airspace disease and small pleural effusion.   Electronically Signed   By: Allen Wood M.D.   On: 01/31/2014 11:37   Dg Abd Portable 1v  01/31/2014   CLINICAL DATA:  Abdominal distension  EXAM: PORTABLE ABDOMEN - 1 VIEW  COMPARISON:  Portable exam 0959 hr compared to 01/30/2014  FINDINGS: Tip of feeding tube projects over distal gastric antrum.  Nonobstructive bowel gas pattern.  No bowel dilatation or bowel wall thickening.  Osseous structures unremarkable.  Pleural effusion identified at RIGHT lung base with additional bibasilar opacities which could represent atelectasis or infiltrate.  IMPRESSION: RIGHT pleural effusion with question bibasilar atelectasis versus infiltrate.  Nonobstructive bowel gas pattern.  Tip of feeding tube projects over distal gastric antrum.   Electronically Signed   By: Allen Wood M.D.   On: 01/31/2014 10:14   ASSESSMENT / PLAN:  PULMONARY OETT 12/22 >> 12/26>>12/26>>> Trach 1/5 (df)>>> A: Acute Hypoxic Respiratory Failure - in setting of RLL Strep pneumonia CAP.   R Pleural Effusion - 12/29 thora culture negative.   Tobacco Abuse P:   - when rass improved reconsider wean ps 10, cpap 5 with goal to 5/5 - Daily WUA - Xopenex/Atrovent Q6 + Q3 PRN xopenex. - Need reduction in sedation to wean successfully - [place] Chest by PCCM this afternoon -consider gradual neg balance again on cvvhd  CARDIOVASCULAR CVL L IJ TLC 12/22 >> 12/30, R IJ TLC 12/30>>>, R fem Aline 1/6>> A:  Severe Sepsis - in setting of PNA.  Required  pressors 12/23, off 12/24 Tachycardia -improved Echo 1/1 showed preserved EF Bradycardia mild P:  - Volume overload>> hypotension yesterday, 2L given and improved - improved - On Neo 37.5 mcg- no woff - Bradycardic in 50-60s, BP stable - related to sedation - Balance now up, consider to neg 50  - Continue SCD -lactic acid re assuring -MAP goal 60  RENAL A:   AKI > likely ATN vs AIN  Hypocalcemia anasarca P:   - Replace electrolytes as indicated. - CRRT per renal discuss to now 50 cc - Neg balance goals daily  GASTROINTESTINAL A:   Protein Calorie Malnutrition - albumin 2.0 Constipation noted. Etoh by history but no evidence of hepatic dysfunction for the time  being. High residuals R/o cdiff- negative P:   - TF as ordered at goal. Continue TF at 20, attempt to 30  - Continue PPI BID, add goal 2 weeks - GI consulted, will wait on EGD and PEG until improves, likley can do Friday, BP better - KUB with no acute process - Add reglan, if qtc less 500  HEMATOLOGIC A:   Macrocytic Anemia - in setting of ETOH abuse 1/2 hbg tr down , no sign of active bleeding  1/4 Hbg tr down again, FOBT+ on 1/2 but no frank bleeding Thrombocytosis - likely reactive vs. Iron deficiency anemia in setting of possible GI bleeding 1/6 Hbg trend down again to 7.0 P:  - Thiamine / Folate / MVI. - Trend CBC in am with neg balance restart - Consider transfusing if Hbg < 7.0 -scd - no evidence of clotting: viscosity level pending -concern is GI bleed with neg balance daily and hct still dropping - hope for egd peg in am  - GI consulted, but waiting on EGD until more stable  INFECTIOUS A:   RLL PNA - strep antigen positive  1/2 >temp and wbc tr down  1/6> Fever 102 1/6 UTI P:   - BCx2 12/21 >>strep pneumo sens - UC  12/21 >> ng - Sputum 12/21 >> moderate strep pneumoniae - Blood 12/30>>> ng - Urine 12/30>>> ng - Sputum 12/30>>> abundant WBCs, no organisms - Pleural fluid culture 12/29>>>  WBCs, no organisms - BC 1/6>>>   - Abx: Rocephin, start date 12/21>>>12/30. - Abx: Azithro, 12/21>>12/25 - Vancomycin 12/30>>>12/31, 1/6>> - Zosyn 12/30>>>1/4 - Ceftazidime 1/6>>>  - UA with many bacteria, WBCs 11-20, mod leuks>> UTI, dc foley ensure, use condom if able - 1/6 C diff neg - [place] chest today - Pct algo to reduce abx exposure, repeat in am   ENDOCRINE A:   No acute issues  P:   - Monitor glucose on BMP  NEUROLOGIC A:   ETOH Abuse - at risk withdrawal Seizures - x2 overnight 12/23, fever up to 105 +/- ETOH withdrawal Anxiety / Depression  Concern for wernicke's per neuro. P:   - RASS goal: -1 - Fentanyl gtt for pain - Precedex  - Thiamine per neuro  - Continue versed gtt - reduce as able, haldol added, goal to dc, prior to any reductions otherwise PRN and scheduled non drips - Continue Risperdal 2 mg BID - Haldol q4h PRN, will administer - Continue Depakote 250 mg Q8H - seems to have helped (lft in am ) - Continue Ativan 4 mg IV Q4H, hold rass -2/-3 - Likely to fent patch soon, if not successful, will add dilauded  FAMILY  - Updates: Updated wife at bedside 1/2, 1/5, 1/6    Allen Felling, MD Internal Medicine Resident, PGY-1    STAFF NOTE: I, Allen Roof, MD FACP have personally reviewed patient's available data, including medical history, events of note, physical examination and test results as part of my evaluation. I have discussed with resident/NP and other care providers such as pharmacist, RN and RRT. In addition, I personally evaluated patient and elicited key findings of: Much more calm, allow rass to rise as we reduce drips, add fent patch, less coarse, BP improved after volume, will [place] chest, wife I updated, cdiff neg, Urine source likley, dc foley  The patient is critically ill with multiple organ systems failure and requires high complexity decision making for assessment and support, frequent evaluation and titration of therapies,  application of advanced monitoring  technologies and extensive interpretation of multiple databases.   Critical Care Time devoted to patient care services described in this note is 30 Minutes. This time reflects time of care of this signee: Allen Roof, MD FACP. This critical care time does not reflect procedure time, or teaching time or supervisory time of PA/NP/Med student/Med Resident etc but could involve care discussion time. Rest per NP/medical resident whose note is outlined above and that I agree with   Allen Wood. Allen Mould, MD, [place] Pgr: [facility] 02/01/2014 8:33 AM

## 2014-02-01 NOTE — Progress Notes (Signed)
Patient ID: Allen Wood, male   DOB: 11/28/1970, 44 y.o.   MRN: [id number]  [facility] Progress Note    Assessment/ Plan:   1. AKI: likely from ATN of sepsis associated with strep PNA. Remains anuric without any evident renal recovery-will continue CRRT at this time and consider switch to intermittent hemodialysis in the near future. 2. Strep PNA/VDRF: Ongoing chronic wean efforts now status post tracheostomy 3. H/O EtOH abuse: On sedation as well as precautions/monitoring for withdrawal 4. Anemia: No overt losses noted, check iron stores and start ESA. 5. Malnutrition: On tube feeds  Subjective:   Hypotensive yesterday-requiring fluid boluses    Objective:   BP 114/58 mmHg  Pulse 67  Temp(Src) 97.8 F (36.6 C) (Oral)  Resp 16  Ht 5\' 11"  (1.803 m)  Wt 76 kg (167 lb 8.8 oz)  BMI 23.38 kg/m2  SpO2 100%  Intake/Output Summary (Last 24 hours) at 02/01/14 0823 Last data filed at 02/01/14 0800  Gross per 24 hour  Intake 3320.97 ml  Output   2878 ml  Net 442.97 ml   Weight change: 0.4 kg (14.1 oz)  Physical Exam: Gen: Intubated, sedated CVS: Pulses regular in rate and rhythm, S1 and S2 normal Resp: Clear to auscultation, no rales/rhonchi Abd: Soft, flat, nontender Ext: No lower extremity edema. 2+ upper extremity edema  Imaging: Dg Chest Port 1 View  01/31/2014   CLINICAL DATA:  Sepsis.  Community acquired pneumonia.  EXAM: PORTABLE CHEST - 1 VIEW  COMPARISON:  01/30/2014  FINDINGS: Support lines and tubes remain in appropriate position. Right mid and lower lung airspace disease and small right pleural effusion show no significant change.  There has been decrease in atelectasis in the left upper lobe since previous study. Increased airspace disease is seen in the left lung base.  Heart size remains normal.  No pneumothorax visualized.  IMPRESSION: Decreased left upper lobe atelectasis, but increased in airspace disease in left lung base.  No significant change  in right mid and lower lung airspace disease and small pleural effusion.   Electronically Signed   By: Earle Gell M.D.   On: 01/31/2014 11:37   Dg Chest Port 1 View  01/30/2014   CLINICAL DATA:  Post tracheostomy  EXAM: PORTABLE CHEST - 1 VIEW  COMPARISON:  01/29/2014  FINDINGS: Cardiomediastinal silhouette is stable. Tracheostomy tube in place. No pneumothorax. Stable right subclavian central line position. Stable left IJ central line catheter with tip in distal SVC. NG tube is unchanged in position. Central mild vascular congestion and mild perihilar interstitial prominence without significant change from prior exam. Stable small right pleural effusion with right lower lobe atelectasis or infiltrate. There is small loculated left basilar pleural effusion with left basilar atelectasis. Small amount of fluid along the left major fissure or partial atelectasis left upper lobe.  IMPRESSION: Tracheostomy tube in place. No pneumothorax. Stable right subclavian and left IJ central line position. Central mild vascular congestion and mild perihilar interstitial prominence without change from prior exam. Stable small right pleural effusion with right lower lobe atelectasis or infiltrate. There is small loculated left basilar pleural effusion with left basilar atelectasis. Small amount of fluid along the left major fissure or partial atelectasis left upper lobe.   Electronically Signed   By: Lahoma Crocker M.D.   On: 01/30/2014 14:03   Dg Abd Portable 1v  01/31/2014   CLINICAL DATA:  Abdominal distension  EXAM: PORTABLE ABDOMEN - 1 VIEW  COMPARISON:  Portable exam 0959 hr  compared to 01/30/2014  FINDINGS: Tip of feeding tube projects over distal gastric antrum.  Nonobstructive bowel gas pattern.  No bowel dilatation or bowel wall thickening.  Osseous structures unremarkable.  Pleural effusion identified at RIGHT lung base with additional bibasilar opacities which could represent atelectasis or infiltrate.  IMPRESSION: RIGHT  pleural effusion with question bibasilar atelectasis versus infiltrate.  Nonobstructive bowel gas pattern.  Tip of feeding tube projects over distal gastric antrum.   Electronically Signed   By: Lavonia Dana M.D.   On: 01/31/2014 10:14   Dg Abd Portable 1v  01/30/2014   CLINICAL DATA:  Encounter for feeding tube placement.  EXAM: PORTABLE ABDOMEN - 1 VIEW  COMPARISON:  January 20, 2014.  FINDINGS: Distal tip of feeding tube is seen in lower abdomen most consistent with distal stomach. No abnormal bowel gas pattern is noted. No abnormal calcifications are noted.  IMPRESSION: Distal tip of feeding tube is seen in lower abdomen consistent with distal portion of low lying stomach.   Electronically Signed   By: Sabino Dick M.D.   On: 01/30/2014 13:46    Labs: BMET  Recent Labs Lab 01/29/14 0350 01/29/14 1600 01/30/14 0413 01/30/14 1552 01/31/14 0223 01/31/14 1530 02/01/14 0605  NA 137  137 136 137 135 137 138 137  K 3.9  3.9 3.9 3.6 4.3 4.0 4.3 3.9  CL 105  105 104 104 104 106 106 107  CO2 25  26 27 28 27 25 23 26   GLUCOSE 109*  110* 119* 127* 107* 102* 93 104*  BUN 19  19 14 10 12 9 9 8   CREATININE 1.46*  1.48* 1.37* 1.19 1.53* 1.38* 1.38* 1.41*  CALCIUM 7.6*  7.6* 7.6* 7.9* 7.7* 7.5* 7.7* 7.9*  PHOS 3.2 2.7 2.9 3.0 2.8 3.5 3.4   CBC  Recent Labs Lab 01/27/14 0539 01/28/14 0418 01/29/14 0350 01/30/14 0705 01/31/14 0223  WBC 10.3 9.7 10.8* 10.9* 12.7*  NEUTROABS 7.5  --   --   --   --   HGB 6.4* 8.0* 7.4* 7.8* 7.0*  HCT 19.9* 24.5* 22.8* 23.9* 21.2*  MCV 107.6* 101.7* 101.3* 101.7* 104.4*  PLT 796* 753* 757* 767* 641*    Medications:    . antiseptic oral rinse  7 mL Mouth Rinse QID  . cefTAZidime (FORTAZ)  IV  2 g Intravenous Q12H  . chlorhexidine  15 mL Mouth Rinse BID  . folic acid  1 mg Oral Daily  . ipratropium-albuterol  3 mL Nebulization Q6H  . LORazepam  4 mg Intravenous Q4H  . multivitamin  5 mL Per Tube Daily  . pantoprazole sodium  40 mg Per Tube BID   . risperiDONE  2 mg Oral BID  . sertraline  50 mg Oral Daily  . thiamine  100 mg Oral Daily   Or  . thiamine  100 mg Intravenous Daily  . valproate sodium  250 mg Intravenous 3 times per day  . vancomycin  125 mg Oral 4 times per day  . vancomycin  750 mg Intravenous Q24H   Elmarie Shiley, MD 02/01/2014, 8:23 AM

## 2014-02-01 NOTE — Progress Notes (Signed)
NUTRITION FOLLOW UP  Intervention:    Continue TF via OGT, change to Vital High Protein at 75 ml/h (1800 ml per day) to provide 1800 kcals, 158 gm protein, 1505 ml free water daily.  Recommend only hold TF if residuals are >500 ml or other signs of intolerance (vomiting, abdominal distention).  Nutrition Dx:   Inadequate oral intake related to inability to eat as evidenced by NPO; ongoing  Goal:   Intake to meet >90% of estimated nutrition needs; progressing.  Monitor:   Weight trend, NPO, vent status/settings, labs, TF initiation and tolerance  Assessment:   Patient admitted 12/21 with ongoing fatigue, cough with productive yellow sputum, R posterior back pain, chills & sweats. Work up found patient to have RLL PNA with positive strep antigen.   Pt was transferred to [facility] on 01/26/13 for CRRT. Still requiring CRRT. Pt is currently receiving Vital AF 1.2 @ 75 ml/hr, meeting nutrition goals. Discussed patient in ICU rounds today. Residuals up to 380 ml over the past 24 hours. TF still running at 75 ml/h.  S/P tracheostomy on 1/5. Patient is currently on ventilator support. MV: 9.6 L/min Temp (24hrs), Avg:97.6 F (36.4 C), Min:96.1 F (35.6 C), Max:98.6 F (37 C)   Height: Ht Readings from Last 1 Encounters:  01/26/14 5\' 11"  (1.803 m)    Weight Status:   Wt Readings from Last 1 Encounters:  02/01/14 167 lb 8.8 oz (76 kg)  01/20/14 167 lb (76 kg)  Re-estimated needs:  Kcal: 1868 Protein: 150 gm for CRRT Fluid: 2 L  Skin: Intact  Diet Order: Diet NPO time specified   Intake/Output Summary (Last 24 hours) at 02/01/14 1132 Last data filed at 02/01/14 1105  Gross per 24 hour  Intake 3895.61 ml  Output   2897 ml  Net 998.61 ml    Last BM: 1/6   Labs:   Recent Labs Lab 01/30/14 0413  01/31/14 0223 01/31/14 1530 02/01/14 0603 02/01/14 0605  NA 137  < > 137 138  --  137  K 3.6  < > 4.0 4.3  --  3.9  CL 104  < > 106 106  --  107  CO2 28  < > 25 23   --  26  BUN 10  < > 9 9  --  8  CREATININE 1.19  < > 1.38* 1.38*  --  1.41*  CALCIUM 7.9*  < > 7.5* 7.7*  --  7.9*  MG 2.2  --  2.2  --  2.1  --   PHOS 2.9  < > 2.8 3.5  --  3.4  GLUCOSE 127*  < > 102* 93  --  104*  < > = values in this interval not displayed.  CBG (last 3)   Recent Labs  01/31/14 2002 01/31/14 2337 02/01/14 0353  GLUCAP 85 84 83    Scheduled Meds: . antiseptic oral rinse  7 mL Mouth Rinse QID  . cefTAZidime (FORTAZ)  IV  2 g Intravenous Q12H  . chlorhexidine  15 mL Mouth Rinse BID  . [START ON 02/02/2014] darbepoetin (ARANESP) injection - NON-DIALYSIS  60 mcg Subcutaneous Q Fri-1800  . fentaNYL  75 mcg Transdermal Q72H  . ferric gluconate (FERRLECIT/NULECIT) IV  250 mg Intravenous Q T,Th,S,Su  . folic acid  1 mg Oral Daily  . ipratropium-albuterol  3 mL Nebulization Q6H  . LORazepam  4 mg Intravenous Q4H  . metoCLOPramide  5 mg Oral BID  . multivitamin  5 mL  Per Tube Daily  . pantoprazole sodium  40 mg Per Tube BID  . [START ON 02/15/2014] pantoprazole sodium  40 mg Per Tube Daily  . risperiDONE  2 mg Oral BID  . sertraline  50 mg Oral Daily  . thiamine  100 mg Oral Daily   Or  . thiamine  100 mg Intravenous Daily  . valproate sodium  250 mg Intravenous 3 times per day  . vancomycin  750 mg Intravenous Q24H    Continuous Infusions: . sodium chloride 10 mL/hr at 01/31/14 1319  . dexmedetomidine 1.8 mcg/kg/hr (02/01/14 0919)  . feeding supplement (VITAL AF 1.2 CAL) 1,000 mL (01/31/14 1752)  . fentaNYL infusion INTRAVENOUS 250 mcg/hr (02/01/14 1059)  . midazolam (VERSED) infusion 7 mg/hr (02/01/14 1059)  . phenylephrine (NEO-SYNEPHRINE) Adult infusion 50 mcg/min (02/01/14 0500)  . dialysis replacement fluid (prismasate) 200 mL/hr at 02/01/14 0200  . dialysis replacement fluid (prismasate) 200 mL/hr at 01/30/14 3606  . dialysate (PRISMASATE) 1,500 mL/hr at 02/01/14 [place], [place], [place], [place] Pager [phone] After Hours Pager  [phone]

## 2014-02-02 ENCOUNTER — Inpatient Hospital Stay (HOSPITAL_COMMUNITY): Payer: Medicaid Other

## 2014-02-02 LAB — CBC WITH DIFFERENTIAL/PLATELET
Basophils Absolute: 0.1 10*3/uL (ref 0.0–0.1)
Basophils Relative: 0 % (ref 0–1)
EOS ABS: 0 10*3/uL (ref 0.0–0.7)
Eosinophils Relative: 0 % (ref 0–5)
HCT: 21.3 % — ABNORMAL LOW (ref 39.0–52.0)
Hemoglobin: 7 g/dL — ABNORMAL LOW (ref 13.0–17.0)
LYMPHS ABS: 0.9 10*3/uL (ref 0.7–4.0)
Lymphocytes Relative: 6 % — ABNORMAL LOW (ref 12–46)
MCH: 33.7 pg (ref 26.0–34.0)
MCHC: 32.9 g/dL (ref 30.0–36.0)
MCV: 102.4 fL — AB (ref 78.0–100.0)
Monocytes Absolute: 0.7 10*3/uL (ref 0.1–1.0)
Monocytes Relative: 5 % (ref 3–12)
NEUTROS ABS: 12.2 10*3/uL — AB (ref 1.7–7.7)
NEUTROS PCT: 89 % — AB (ref 43–77)
Platelets: 644 10*3/uL — ABNORMAL HIGH (ref 150–400)
RBC: 2.08 MIL/uL — AB (ref 4.22–5.81)
RDW: 15.1 % (ref 11.5–15.5)
WBC: 13.9 10*3/uL — AB (ref 4.0–10.5)

## 2014-02-02 LAB — CBC
HCT: 20.6 % — ABNORMAL LOW (ref 39.0–52.0)
Hemoglobin: 6.7 g/dL — CL (ref 13.0–17.0)
MCH: 33.3 pg (ref 26.0–34.0)
MCHC: 32.5 g/dL (ref 30.0–36.0)
MCV: 102.5 fL — ABNORMAL HIGH (ref 78.0–100.0)
Platelets: 714 10*3/uL — ABNORMAL HIGH (ref 150–400)
RBC: 2.01 MIL/uL — ABNORMAL LOW (ref 4.22–5.81)
RDW: 14 % (ref 11.5–15.5)
WBC: 13.9 10*3/uL — ABNORMAL HIGH (ref 4.0–10.5)

## 2014-02-02 LAB — RENAL FUNCTION PANEL
ALBUMIN: 1.5 g/dL — AB (ref 3.5–5.2)
Anion gap: 10 (ref 5–15)
BUN: <5 mg/dL — ABNORMAL LOW (ref 6–23)
CALCIUM: 8.2 mg/dL — AB (ref 8.4–10.5)
CHLORIDE: 101 meq/L (ref 96–112)
CO2: 24 mmol/L (ref 19–32)
Creatinine, Ser: 1.43 mg/dL — ABNORMAL HIGH (ref 0.50–1.35)
GFR, EST AFRICAN AMERICAN: 67 mL/min — AB (ref >90)
GFR, EST NON AFRICAN AMERICAN: 58 mL/min — AB (ref >90)
Glucose, Bld: 94 mg/dL (ref 70–99)
PHOSPHORUS: 3.3 mg/dL (ref 2.3–4.6)
Potassium: 4.9 mmol/L (ref 3.5–5.1)
Sodium: 135 mmol/L (ref 135–145)

## 2014-02-02 LAB — PROCALCITONIN: Procalcitonin: 0.43 ng/mL

## 2014-02-02 LAB — URINE CULTURE
CULTURE: NO GROWTH
Colony Count: NO GROWTH

## 2014-02-02 LAB — COMPREHENSIVE METABOLIC PANEL
ALT: 14 U/L (ref 0–53)
AST: 16 U/L (ref 0–37)
Albumin: 1.5 g/dL — ABNORMAL LOW (ref 3.5–5.2)
Alkaline Phosphatase: 57 U/L (ref 39–117)
Anion gap: 8 (ref 5–15)
BUN: 6 mg/dL (ref 6–23)
CO2: 25 mmol/L (ref 19–32)
Calcium: 8 mg/dL — ABNORMAL LOW (ref 8.4–10.5)
Chloride: 103 mEq/L (ref 96–112)
Creatinine, Ser: 1.31 mg/dL (ref 0.50–1.35)
GFR calc Af Amer: 75 mL/min — ABNORMAL LOW (ref >90)
GFR calc non Af Amer: 64 mL/min — ABNORMAL LOW (ref >90)
Glucose, Bld: 88 mg/dL (ref 70–99)
Potassium: 4.4 mmol/L (ref 3.5–5.1)
Sodium: 136 mmol/L (ref 135–145)
Total Bilirubin: 0.4 mg/dL (ref 0.3–1.2)
Total Protein: 5.3 g/dL — ABNORMAL LOW (ref 6.0–8.3)

## 2014-02-02 LAB — GLUCOSE, CAPILLARY
Glucose-Capillary: 76 mg/dL (ref 70–99)
Glucose-Capillary: 78 mg/dL (ref 70–99)
Glucose-Capillary: 88 mg/dL (ref 70–99)
Glucose-Capillary: 89 mg/dL (ref 70–99)
Glucose-Capillary: 89 mg/dL (ref 70–99)
Glucose-Capillary: 94 mg/dL (ref 70–99)

## 2014-02-02 LAB — APTT: aPTT: 51 seconds — ABNORMAL HIGH (ref 24–37)

## 2014-02-02 LAB — MAGNESIUM: MAGNESIUM: 2.2 mg/dL (ref 1.5–2.5)

## 2014-02-02 LAB — PREPARE RBC (CROSSMATCH)

## 2014-02-02 MED ORDER — PHENYLEPHRINE HCL 10 MG/ML IJ SOLN
0.0000 ug/min | INTRAVENOUS | Status: DC
  Administered 2014-02-03: 5 ug/min via INTRAVENOUS
  Filled 2014-02-02: qty 4

## 2014-02-02 MED ORDER — VALPROATE SODIUM 500 MG/5ML IV SOLN
500.0000 mg | Freq: Three times a day (TID) | INTRAVENOUS | Status: DC
  Administered 2014-02-02 (×2): 500 mg via INTRAVENOUS
  Filled 2014-02-02 (×5): qty 5

## 2014-02-02 MED ORDER — SODIUM CHLORIDE 0.9 % IV SOLN: Freq: Once | INTRAVENOUS | Status: AC

## 2014-02-02 MED ORDER — METOPROLOL TARTRATE 1 MG/ML IV SOLN
5.0000 mg | INTRAVENOUS | Status: AC | PRN
  Administered 2014-02-02 (×2): 5 mg via INTRAVENOUS
  Filled 2014-02-02: qty 5

## 2014-02-02 MED ORDER — HALOPERIDOL LACTATE 5 MG/ML IJ SOLN: 10.0000 mg | INTRAMUSCULAR | Status: DC | PRN

## 2014-02-02 MED ORDER — HYDROCORTISONE NA SUCCINATE PF 100 MG IJ SOLR
50.0000 mg | Freq: Four times a day (QID) | INTRAMUSCULAR | Status: DC
Start: 2014-02-02 — End: 2014-02-03
  Administered 2014-02-02 – 2014-02-03 (×4): 50 mg via INTRAVENOUS
  Filled 2014-02-02: qty 2
  Filled 2014-02-02: qty 1
  Filled 2014-02-02: qty 2
  Filled 2014-02-02 (×4): qty 1

## 2014-02-02 MED ORDER — PROPOFOL 10 MG/ML IV EMUL: 5.0000 ug/kg/min | INTRAVENOUS | Status: DC

## 2014-02-02 NOTE — Progress Notes (Signed)
Case discussed with Dr. Titus Mould who is very concerned about the patients slowly progressive anemia, heme + stool and long term feeding problems. The patient has significant comorbidities however Dr. Titus Mould feels the patient will not likely be more stable than now for endoscopic procedures. He would like [place] to proceed with EGD tomorrow and, if feasible, PEG placement tomorrow. Procedure scheduled for 0730 at bedside.

## 2014-02-02 NOTE — Progress Notes (Signed)
Patient ID: Allen Wood, male   DOB: 1970-04-07, 44 y.o.   MRN: [id number]  [facility] Progress Note    Assessment/ Plan:   1. AKI: likely from ATN of sepsis associated with strep PNA. Remains anuric without any evident renal recovery-continue CRRT for the next 24 hours and discontinue tomorrow morning. Monitor over the weekend for intermittent dialysis needs. 2. Strep PNA/VDRF: Ongoing chronic wean efforts now status post tracheostomy. Remains on antibiotic therapy Tressie Ellis) with mild leukocytosis 3. H/O EtOH abuse: Attempting lightening of sedation, continue to monitor 4. Anemia: No overt losses noted, check iron stores and start ESA. 5. Malnutrition: On tube feeds  Subjective:   No acute events overnight-sedation lightened    Objective:   BP 127/76 mmHg  Pulse 117  Temp(Src) 97.8 F (36.6 C) (Oral)  Resp 17  Ht 5\' 11"  (1.803 m)  Wt 76.2 kg (167 lb 15.9 oz)  BMI 23.44 kg/m2  SpO2 99%  Intake/Output Summary (Last 24 hours) at 02/02/14 0801 Last data filed at 02/02/14 0700  Gross per 24 hour  Intake 2995.32 ml  Output   3805 ml  Net -809.68 ml   Weight change: 0.2 kg (7.1 oz)  Physical Exam: Gen: Comfortable resting in bed, ventilatory via trach CVS: Pulse regular tachycardia Resp: Clear to auscultation, no rales Abd: Soft, flat, nontender Ext: No lower extremity edema, 1-2+ upper extremity edema  Imaging: Dg Chest Port 1 View  01/31/2014   CLINICAL DATA:  Sepsis.  Community acquired pneumonia.  EXAM: PORTABLE CHEST - 1 VIEW  COMPARISON:  01/30/2014  FINDINGS: Support lines and tubes remain in appropriate position. Right mid and lower lung airspace disease and small right pleural effusion show no significant change.  There has been decrease in atelectasis in the left upper lobe since previous study. Increased airspace disease is seen in the left lung base.  Heart size remains normal.  No pneumothorax visualized.  IMPRESSION: Decreased left upper lobe  atelectasis, but increased in airspace disease in left lung base.  No significant change in right mid and lower lung airspace disease and small pleural effusion.   Electronically Signed   By: Earle Gell M.D.   On: 01/31/2014 11:37   Dg Abd Portable 1v  01/31/2014   CLINICAL DATA:  Abdominal distension  EXAM: PORTABLE ABDOMEN - 1 VIEW  COMPARISON:  Portable exam 0959 hr compared to 01/30/2014  FINDINGS: Tip of feeding tube projects over distal gastric antrum.  Nonobstructive bowel gas pattern.  No bowel dilatation or bowel wall thickening.  Osseous structures unremarkable.  Pleural effusion identified at RIGHT lung base with additional bibasilar opacities which could represent atelectasis or infiltrate.  IMPRESSION: RIGHT pleural effusion with question bibasilar atelectasis versus infiltrate.  Nonobstructive bowel gas pattern.  Tip of feeding tube projects over distal gastric antrum.   Electronically Signed   By: Lavonia Dana M.D.   On: 01/31/2014 10:14    Labs: BMET  Recent Labs Lab 01/29/14 1600 01/30/14 0413 01/30/14 1552 01/31/14 0223 01/31/14 1530 02/01/14 0605 02/01/14 1600 02/02/14 0330  NA 136 137 135 137 138 137 136 136  K 3.9 3.6 4.3 4.0 4.3 3.9 4.3 4.4  CL 104 104 104 106 106 107 106 103  CO2 27 28 27 25 23 26 22 25   GLUCOSE 119* 127* 107* 102* 93 104* 124* 88  BUN 14 10 12 9 9 8 7 6   CREATININE 1.37* 1.19 1.53* 1.38* 1.38* 1.41* 1.29 1.31  CALCIUM 7.6* 7.9* 7.7* 7.5* 7.7*  7.9* 7.8* 8.0*  PHOS 2.7 2.9 3.0 2.8 3.5 3.4 3.4  --    CBC  Recent Labs Lab 01/27/14 0539 01/28/14 0418 01/29/14 0350 01/30/14 0705 01/31/14 0223  WBC 10.3 9.7 10.8* 10.9* 12.7*  NEUTROABS 7.5  --   --   --   --   HGB 6.4* 8.0* 7.4* 7.8* 7.0*  HCT 19.9* 24.5* 22.8* 23.9* 21.2*  MCV 107.6* 101.7* 101.3* 101.7* 104.4*  PLT 796* 753* 757* 767* 641*    Medications:    . antiseptic oral rinse  7 mL Mouth Rinse QID  . cefTAZidime (FORTAZ)  IV  2 g Intravenous Q12H  . chlorhexidine  15 mL Mouth  Rinse BID  . darbepoetin (ARANESP) injection - NON-DIALYSIS  60 mcg Subcutaneous Q Fri-1800  . fentaNYL  75 mcg Transdermal Q72H  . ferric gluconate (FERRLECIT/NULECIT) IV  250 mg Intravenous Q T,Th,S,Su  . folic acid  1 mg Oral Daily  . ipratropium-albuterol  3 mL Nebulization Q6H  . LORazepam  4 mg Intravenous Q4H  . metoCLOPramide  10 mg Oral Q6H  . multivitamin  5 mL Per Tube Daily  . pantoprazole sodium  40 mg Per Tube BID  . [START ON 02/15/2014] pantoprazole sodium  40 mg Per Tube Daily  . risperiDONE  2 mg Oral BID  . sertraline  50 mg Oral Daily  . thiamine  100 mg Oral Daily   Or  . thiamine  100 mg Intravenous Daily  . valproate sodium  250 mg Intravenous 3 times per day  . vancomycin  750 mg Intravenous Q24H   Elmarie Shiley, MD 02/02/2014, 8:01 AM

## 2014-02-02 NOTE — Progress Notes (Signed)
Hemoglobin level called 6.7, Dr. Titus Mould made aware. See new orders

## 2014-02-02 NOTE — Progress Notes (Signed)
PULMONARY / CRITICAL CARE MEDICINE   Name: Allen Wood MRN: [id number] DOB: [birth year]    ADMISSION DATE:  01/15/2014 CONSULTATION DATE:  01/16/14  REFERRING MD :  Dr. Grandville Silos   CHIEF COMPLAINT:  Acute Respiratory Failure   INITIAL PRESENTATION: 44 y/o M, smoker / ETOH abuse, admitted 12/21 with ongoing fatigue, cough with productive yellow sputum, R posterior back pain, chills & sweats.  Work up found patient to have RLL PNA with positive strep antigen.  Developed worsening respiratory failure 12/22  STUDIES:  12/24 EEG >> neg 1/1 Echo >EF 60%, no wall abn 1/7 RUE Doppler>> neg for DVT  SIGNIFICANT EVENTS: 12/21  Admit with RLL PNA 12/22  Decompensated, required intubation for respiratory failure in setting of PNA 12/23  Stable on vent, increased pleural effusion on CXR.  Levo @ 2 mcg, Fent 182mcg, Precedex 0.6.  Seizures overnight, Tmax 105 12/24  Concern for ileus with TF's coming out of mouth overnight.  Reglan dosing 12/26  Reintubated due to inability to clear secretions 12/30 Cr rising, UOP dropping. 12/31 Cr up to 1.9, UOP 250 ml over 24 hours, renal consult called 1/1 transfused 1 U pRBC;  1/1 Transferred to [facility] , CRRT per renal  1/2 Transfused 1 U pRBC 1/5 Trach placed 1/6 Hypotension, on Neo, fevers  SUBJECTIVE:  Hypothermia overnight Required Neo this AM Tachycardic mild Sedated, easily agitated  Trach in place Meeting -50/hr goal for balance   VITAL SIGNS: Temp:  [91.7 F (33.2 C)-99.8 F (37.7 C)] 97.6 F (36.4 C) (01/08 0400) Pulse Rate:  [56-144] 115 (01/08 0400) Resp:  [14-27] 14 (01/08 0400) BP: (73-136)/(39-83) 129/83 mmHg (01/08 0400) SpO2:  [96 %-100 %] 100 % (01/08 0400) Arterial Line BP: (72-155)/(40-84) 122/65 mmHg (01/08 0400) FiO2 (%):  [50 %] 50 % (01/08 0300) Weight:  [76.2 kg (167 lb 15.9 oz)] 76.2 kg (167 lb 15.9 oz) (01/08 0334)   HEMODYNAMICS: CVP:  [13 mmHg] 13 mmHg   VENTILATOR SETTINGS: Vent Mode:  [-] PRVC FiO2 (%):   [50 %] 50 % Set Rate:  [16 bmp] 16 bmp Vt Set:  [600 mL] 600 mL PEEP:  [5 cmH20] 5 cmH20 Plateau Pressure:  [9 cmH20-18 cmH20] 15 cmH20   INTAKE / OUTPUT:  Intake/Output Summary (Last 24 hours) at 02/02/14 0626 Last data filed at 02/02/14 0600  Gross per 24 hour  Intake 3169.48 ml  Output   3845 ml  Net -675.52 ml   PHYSICAL EXAMINATION: General: sedated on vent, rass -4 HEENT: NCAT, EOMI PULM: crackles bilaterally, no wheezing, trach clean CV: RRR, no mgr Ab: BS+, soft, nontender Ext: anasarca noted throughout Neuro: sedated on vent, intermittently agitated  LABS:  CBC  Recent Labs Lab 01/29/14 0350 01/30/14 0705 01/31/14 0223  WBC 10.8* 10.9* 12.7*  HGB 7.4* 7.8* 7.0*  HCT 22.8* 23.9* 21.2*  PLT 757* 767* 641*   BMET  Recent Labs Lab 02/01/14 0605 02/01/14 1600 02/02/14 0330  NA 137 136 136  K 3.9 4.3 4.4  CL 107 106 103  CO2 26 22 25   BUN 8 7 6   CREATININE 1.41* 1.29 1.31  GLUCOSE 104* 124* 88   Electrolytes  Recent Labs Lab 01/31/14 0223 01/31/14 1530 02/01/14 0603 02/01/14 0605 02/01/14 1600 02/02/14 0330  CALCIUM 7.5* 7.7*  --  7.9* 7.8* 8.0*  MG 2.2  --  2.1  --   --  2.2  PHOS 2.8 3.5  --  3.4 3.4  --    Sepsis Markers  Recent  Labs Lab 01/31/14 0944 01/31/14 1100 02/01/14 0603 02/02/14 0500  LATICACIDVEN  --  0.9  --   --   PROCALCITON 0.44  --  0.40 0.43   ABG  Recent Labs Lab 01/29/14 0500 01/31/14 1041  PHART 7.436 7.417  PCO2ART 38.3 34.1*  PO2ART 92.9 40.0*   Liver Enzymes  Recent Labs Lab 02/01/14 0605 02/01/14 1600 02/02/14 0330  AST  --   --  16  ALT  --   --  14  ALKPHOS  --   --  57  BILITOT  --   --  0.4  ALBUMIN 1.4* 1.5* 1.5*   Glucose  Recent Labs Lab 02/01/14 0745 02/01/14 1228 02/01/14 1536 02/01/14 1925 02/02/14 0035 02/02/14 0324  GLUCAP 92 108* 125* 106* 89 78    Imaging No results found. ASSESSMENT / PLAN:  PULMONARY OETT 12/22 >> 12/26>>12/26>>> Trach 1/5  (df)>>> A: Acute Hypoxic Respiratory Failure - in setting of RLL Strep pneumonia CAP.   R Pleural Effusion - 12/29 thora culture negative.   Tobacco Abuse P:   - when rass improved reconsider wean goal to 5/5, unable to TC as of now with sedation needs - Daily WUA - Xopenex/Atrovent Q6 + Q3 PRN xopenex. - Need reduction in sedation to wean successfully - [place] Chest planned - Aim for gradual neg balance on cvvhd>keep same neg balance rate  CARDIOVASCULAR CVL L IJ TLC 12/22 >> 12/30, R IJ TLC 12/30>>>, R fem Aline 1/6>> A:  Severe Sepsis - in setting of PNA.  Required pressors 12/23, off 12/24 Echo 1/1 showed preserved EF Bradycardia mild Tachycardia Shock, neo need - sedation related? Rel AI P:  - On Neo this AM, try to wean off, should BE successful - Meeting neg balance goals - Continue SCD - MAP goal 60 -dc aline -needs stress roids  RENAL A:   AKI > likely ATN vs AIN  Hypocalcemia anasarca P:   - Replace electrolytes as indicated. - CRRT per renal - dc dc cvvhd in am likely - Neg balance goals daily  GASTROINTESTINAL A:   Protein Calorie Malnutrition - albumin 2.0 Constipation noted. Etoh by history but no evidence of hepatic dysfunction for the time being. High residuals R/o cdiff- negative P:   - TF as ordered at goal. Consider keeping low rate since residuals high - Continue PPI BID, goal 2 weeks - GI consulted, likely will do PEG and EGD early next week - Continue Reglan 10 mg Q6H, restart at 20 cc/hr  HEMATOLOGIC A:   Macrocytic Anemia - in setting of ETOH abuse 1/2 hbg tr down , no sign of active bleeding  1/4 Hbg tr down again, FOBT+ on 1/2 but no frank bleeding Thrombocytosis - likely reactive vs. Iron deficiency anemia in setting of possible GI bleeding 1/6 Hbg trend down again to 7.0 P:  - Thiamine / Folate / MVI. -scd - no evidence of clotting: viscosity level 1.8 -GI consulted, to do EGD early next week (prefer this done  Monday)  INFECTIOUS A:   RLL PNA - strep antigen positive  1/2 >temp and wbc tr down  1/6> Fever 102 1/6 UTI P:   - BCx2 12/21 >>strep pneumo sens - UC  12/21 >> ng - Sputum 12/21 >> moderate strep pneumoniae - Blood 12/30>>> ng - Urine 12/30>>> ng - Sputum 12/30>>> abundant WBCs, no organisms - Pleural fluid culture 12/29>>> WBCs, no organisms - BC 1/6>>>   - Abx: Rocephin, start date 12/21>>>12/30. - Abx: Azithro,  12/21>>12/25 - Vancomycin 12/30>>>12/31, 1/6>> - Zosyn 12/30>>>1/4 - Ceftazidime 1/6>>>  - UA with many bacteria, WBCs 11-20, mod leuks>> UTI, foley d/c'd, condom cath? - 1/6 C diff neg - [place] chest today - Pct algo to reduce abx exposure - unimpressed, dc vanc Add stop date 5 days for urine ceftaz  ENDOCRINE A:   Rel AI P:   - Monitor glucose on BMP -stress roids  NEUROLOGIC A:   ETOH Abuse - at risk withdrawal Seizures - x2 overnight 12/23, fever up to 105 +/- ETOH withdrawal Anxiety / Depression  Concern for wernicke's per neuro. P:   - RASS goal: -1 - Fentanyl patch - Precedex d/c'd - Thiamine per neuro  - Versed gtt decreased - Continue Risperdal 2 mg BID - Haldol escalation to finally get off drips, get qtc - Continue Depakote 250 mg Q8H, increase  - Continue Ativan 4 mg IV Q4H, hold rass -2/-3   FAMILY  - Updates: Updated wife at bedside daily update    Albin Felling, MD Internal Medicine Resident, PGY-1   STAFF NOTE: I, Merrie Roof, MD FACP have personally reviewed patient's available data, including medical history, events of note, physical examination and test results as part of my evaluation. I have discussed with resident/NP and other care providers such as pharmacist, RN and RRT. In addition, I personally evaluated patient and elicited key findings QG:BEEFEOFH haldol and Depakote in hopes to finally dc drips, maintain ativan, wean attempts, dc vanc, ceftaz stop date, dc aline The patient is critically ill with multiple organ  systems failure and requires high complexity decision making for assessment and support, frequent evaluation and titration of therapies, application of advanced monitoring technologies and extensive interpretation of multiple databases.   Critical Care Time devoted to patient care services described in this note is30 Minutes. This time reflects time of care of this signee: Merrie Roof, MD FACP. This critical care time does not reflect procedure time, or teaching time or supervisory time of PA/NP/Med student/Med Resident etc but could involve care discussion time. Rest per NP/medical resident whose note is outlined above and that I agree with   Lavon Paganini. Titus Mould, MD, [place] Pgr: [facility] 02/02/2014 8:29 AM

## 2014-02-03 ENCOUNTER — Encounter (HOSPITAL_COMMUNITY)
Admission: EM | Disposition: A | Discharge status: Rehabilitation Facility | Payer: Self-pay | Source: Home / Self Care | Attending: Pulmonary Disease

## 2014-02-03 ENCOUNTER — Encounter (HOSPITAL_COMMUNITY): Payer: Self-pay | Admitting: *Deleted

## 2014-02-03 DIAGNOSIS — N179 Acute kidney failure, unspecified: Secondary | ICD-10-CM

## 2014-02-03 DIAGNOSIS — R195 Other fecal abnormalities: Secondary | ICD-10-CM | POA: Insufficient documentation

## 2014-02-03 DIAGNOSIS — R1312 Dysphagia, oropharyngeal phase: Secondary | ICD-10-CM | POA: Insufficient documentation

## 2014-02-03 DIAGNOSIS — R633 Feeding difficulties, unspecified: Secondary | ICD-10-CM | POA: Insufficient documentation

## 2014-02-03 DIAGNOSIS — J13 Pneumonia due to Streptococcus pneumoniae: Secondary | ICD-10-CM

## 2014-02-03 DIAGNOSIS — D500 Iron deficiency anemia secondary to blood loss (chronic): Secondary | ICD-10-CM | POA: Insufficient documentation

## 2014-02-03 HISTORY — PX: PEG PLACEMENT: SHX5437

## 2014-02-03 HISTORY — PX: ESOPHAGOGASTRODUODENOSCOPY: SHX5428

## 2014-02-03 LAB — TYPE AND SCREEN
ABO/RH(D): O POS
ANTIBODY SCREEN: NEGATIVE
UNIT DIVISION: 0

## 2014-02-03 LAB — CBC
HCT: 23.5 % — ABNORMAL LOW (ref 39.0–52.0)
Hemoglobin: 7.6 g/dL — ABNORMAL LOW (ref 13.0–17.0)
MCH: 32.5 pg (ref 26.0–34.0)
MCHC: 32.3 g/dL (ref 30.0–36.0)
MCV: 100.4 fL — AB (ref 78.0–100.0)
Platelets: 702 10*3/uL — ABNORMAL HIGH (ref 150–400)
RBC: 2.34 MIL/uL — ABNORMAL LOW (ref 4.22–5.81)
RDW: 15.6 % — AB (ref 11.5–15.5)
WBC: 17.6 10*3/uL — ABNORMAL HIGH (ref 4.0–10.5)

## 2014-02-03 LAB — MAGNESIUM: MAGNESIUM: 2.3 mg/dL (ref 1.5–2.5)

## 2014-02-03 LAB — GLUCOSE, CAPILLARY
GLUCOSE-CAPILLARY: 77 mg/dL (ref 70–99)
GLUCOSE-CAPILLARY: 73 mg/dL (ref 70–99)
GLUCOSE-CAPILLARY: 71 mg/dL (ref 70–99)
Glucose-Capillary: 113 mg/dL — ABNORMAL HIGH (ref 70–99)
Glucose-Capillary: 84 mg/dL (ref 70–99)
Glucose-Capillary: 99 mg/dL (ref 70–99)

## 2014-02-03 LAB — APTT: APTT: 41 s — AB (ref 24–37)

## 2014-02-03 SURGERY — EGD (ESOPHAGOGASTRODUODENOSCOPY)
Anesthesia: Moderate Sedation

## 2014-02-03 MED ORDER — CLONIDINE HCL 0.1 MG/24HR TD PTWK
0.1000 mg | MEDICATED_PATCH | TRANSDERMAL | Status: DC
  Administered 2014-02-03: 0.1 mg via TRANSDERMAL
  Filled 2014-02-03: qty 1

## 2014-02-03 MED ORDER — CLONAZEPAM 0.5 MG PO TABS
2.0000 mg | ORAL_TABLET | Freq: Three times a day (TID) | ORAL | Status: DC
  Administered 2014-02-03 – 2014-02-04 (×3): 2 mg
  Filled 2014-02-03 (×3): qty 4

## 2014-02-03 MED ORDER — QUETIAPINE FUMARATE 200 MG PO TABS
200.0000 mg | ORAL_TABLET | Freq: Two times a day (BID) | ORAL | Status: DC
  Administered 2014-02-03 – 2014-02-06 (×7): 200 mg
  Filled 2014-02-03 (×8): qty 1

## 2014-02-03 MED ORDER — HYDRALAZINE HCL 20 MG/ML IJ SOLN: 10.0000 mg | INTRAMUSCULAR | Status: DC | PRN

## 2014-02-03 MED ORDER — LORAZEPAM 2 MG/ML IJ SOLN
1.0000 mg | INTRAMUSCULAR | Status: DC | PRN
  Administered 2014-02-05: 4 mg via INTRAVENOUS
  Administered 2014-02-05: 2 mg via INTRAVENOUS
  Filled 2014-02-03: qty 1
  Filled 2014-02-03: qty 2

## 2014-02-03 MED ORDER — CEFAZOLIN (ANCEF) 1 G IV SOLR
1.0000 g | INTRAVENOUS | Status: DC
  Filled 2014-02-03: qty 1

## 2014-02-03 MED ORDER — FOLIC ACID 1 MG PO TABS
1.0000 mg | ORAL_TABLET | Freq: Every day | ORAL | Status: DC
  Administered 2014-02-03 – 2014-02-08 (×6): 1 mg
  Filled 2014-02-03 (×6): qty 1

## 2014-02-03 MED ORDER — METOPROLOL TARTRATE 1 MG/ML IV SOLN: 2.5000 mg | INTRAVENOUS | Status: DC | PRN

## 2014-02-03 MED ORDER — VALPROIC ACID 250 MG/5ML PO SYRP
500.0000 mg | ORAL_SOLUTION | Freq: Two times a day (BID) | ORAL | Status: DC
  Administered 2014-02-03 – 2014-02-04 (×4): 500 mg
  Filled 2014-02-03 (×6): qty 10

## 2014-02-03 MED ORDER — PROPOFOL 10 MG/ML IV EMUL
INTRAVENOUS | Status: AC
  Administered 2014-02-03: 1000 mg
  Filled 2014-02-03: qty 100

## 2014-02-03 MED ORDER — VITAL HIGH PROTEIN PO LIQD
1000.0000 mL | ORAL | Status: AC
  Administered 2014-02-04 – 2014-02-05 (×3): 1000 mL
  Filled 2014-02-03 (×6): qty 1000

## 2014-02-03 MED ORDER — FAMOTIDINE 40 MG/5ML PO SUSR
20.0000 mg | Freq: Every day | ORAL | Status: DC
  Administered 2014-02-03: 20 mg
  Filled 2014-02-03 (×2): qty 2.5

## 2014-02-03 MED ORDER — HYDROCORTISONE NA SUCCINATE PF 100 MG IJ SOLR
50.0000 mg | Freq: Two times a day (BID) | INTRAMUSCULAR | Status: DC
  Administered 2014-02-03 – 2014-02-04 (×2): 50 mg via INTRAVENOUS
  Filled 2014-02-03 (×2): qty 1

## 2014-02-03 MED ORDER — CEFAZOLIN SODIUM 1-5 GM-% IV SOLN
1.0000 g | Freq: Once | INTRAVENOUS | Status: AC
  Administered 2014-02-03: 1 g via INTRAVENOUS
  Filled 2014-02-03: qty 50

## 2014-02-03 NOTE — Progress Notes (Signed)
Patient placed on 40% trach collar.  Currently tolerating well.  RT will continue to monitor.

## 2014-02-03 NOTE — Op Note (Signed)
[facility] [place], [id number]   ENDOSCOPY PROCEDURE REPORT  PATIENT: Allen, Wood  MR#: [id number] BIRTHDATE: 06-10-1970 , 43  yrs. old GENDER: male ENDOSCOPIST: Ladene Artist, MD, [facility] REFERRED BY:  CCM PROCEDURE DATE:  02/03/2014 PROCEDURE:  EGD, diagnostic and EGD w/ percutaneous gastrostomy tube placement ASA CLASS:     Class III INDICATIONS:  hemocult positive stool, anemia, and dysphagia. MEDICATIONS:   per ICU sfaff                          Mo TOPICAL ANESTHETIC: none DESCRIPTION OF PROCEDURE: After the risks benefits and alternatives of the procedure were thoroughly explained, informed consent was obtained.  The endoscope endoscope was introduced through the mouth and advanced to the second portion of the duodenum , Without limitations.  The instrument was slowly withdrawn as the mucosa was fully examined.  DUODENUM: The duodenal mucosa showed no abnormalities in the 2nd part of the duodenum.  Mild duodenal inflammation was found in the duodenal bulb. STOMACH: The mucosa folds of the stomach appeared normal.  A suitable site for PEG placement was found in the epigastrium by standard palpation and transillumination. This corresponded to the anterior wall of the gastric body. The skin was prepped and drapped in standard sterile fashion. Skin and subq tissue at site of PEG were infiltrated with lidocaine. A 1.5 cm incision was made and the cannula was placed into the stomach, guidewire passed, grasped with the snare and removed orally. The 24 fr pull PEG was attached and pulled into position by standard technique. The internal bumper was in good position, freely rotated, no bleeding noted. ESOPHAGUS: The mucosa of the esophagus appeared normal.  Retroflexed views revealed no abnormalities.     The scope was then withdrawn from the patient and the procedure completed.  COMPLICATIONS: There were no immediate  complications.  ENDOSCOPIC IMPRESSION: 1.   Mild duodenitis in the duodenal bulb 2.   PEG placement  RECOMMENDATIONS: 1.  PPI qam 2. Standard post PEG orders in Epic. Can use tube for meds in 4 hours and for feeding tomorrow morning 3.  No clear source for heme + stool and anemia found but duodenitis is a possible source  eSigned:  Ladene Artist, MD, [facility] 02/03/2014 1:23 PM

## 2014-02-03 NOTE — Progress Notes (Signed)
PULMONARY / CRITICAL CARE MEDICINE   Name: Allen Wood MRN: [id number] DOB: 17-Jul-1970    ADMISSION DATE:  01/15/2014 CONSULTATION DATE:  01/16/14  REFERRING MD :  Dr. Grandville Silos   CHIEF COMPLAINT:  Acute Respiratory Failure   INITIAL PRESENTATION: 44 y/o M, smoker / ETOH abuse, admitted 12/21 with ongoing fatigue, cough with productive yellow sputum, R posterior back pain, chills & sweats.  Work up found patient to have RLL PNA with positive strep antigen.  Developed worsening respiratory failure 12/22  STUDIES:  12/24 EEG >> neg 1/1 Echo >EF 60%, no wall abn 1/7 RUE Doppler>> neg for DVT  SIGNIFICANT EVENTS: 12/21  Admit with RLL PNA 12/22  Decompensated, required intubation for respiratory failure in setting of PNA 12/23  Stable on vent, increased pleural effusion on CXR.  Levo @ 2 mcg, Fent 157mcg, Precedex 0.6.  Seizures overnight, Tmax 105 12/24  Concern for ileus with TF's coming out of mouth overnight.  Reglan dosing 12/26  Reintubated due to inability to clear secretions 12/30 Cr rising, UOP dropping. 12/31 Cr up to 1.9, UOP 250 ml over 24 hours, renal consult called 1/1 transfused 1 U pRBC;  1/1 Transferred to [facility] , CRRT per renal  1/2 Transfused 1 U pRBC 1/5 Trach placed 1/6 Hypotension, on Neo, fevers  SUBJECTIVE:  RASS +1. Tachycardic. +/- F/C. Tremulous. Tolerating ATC   VITAL SIGNS: Temp:  [96.8 F (36 C)-99.7 F (37.6 C)] 97.8 F (36.6 C) (01/09 0700) Pulse Rate:  [103-140] 125 (01/09 0750) Resp:  [10-24] 22 (01/09 0750) BP: (74-153)/(41-93) 134/86 mmHg (01/09 0709) SpO2:  [96 %-100 %] 100 % (01/09 0709) Arterial Line BP: (78-146)/(32-79) 93/41 mmHg (01/08 1630) FiO2 (%):  [40 %] 40 % (01/09 0750) Weight:  [75.2 kg (165 lb 12.6 oz)] 75.2 kg (165 lb 12.6 oz) (01/09 0204)   HEMODYNAMICS:     VENTILATOR SETTINGS: Vent Mode:  [-] PSV;CPAP FiO2 (%):  [40 %] 40 % Set Rate:  [16 bmp] 16 bmp Vt Set:  [600 mL] 600 mL PEEP:  [5 cmH20] 5  cmH20 Pressure Support:  [5 cmH20] 5 cmH20 Plateau Pressure:  [15 cmH20-16 cmH20] 15 cmH20   INTAKE / OUTPUT:  Intake/Output Summary (Last 24 hours) at 02/03/14 0840 Last data filed at 02/03/14 0800  Gross per 24 hour  Intake 759.06 ml  Output   1745 ml  Net -985.94 ml   PHYSICAL EXAMINATION: General: tremulous, RASS +1, + F/C HEENT: NCAT PULM: coarse, no wheezes CV: tachy, regular, no M Ab: BS+, soft, nontender Ext: no edema, warm Neuro: no focal deficits  LABS: I have reviewed all of today's lab results. Relevant abnormalities are discussed in the A/P section  CXR: NNF. Prior films reviewed  ASSESSMENT / PLAN:  PULMONARY OETT 12/22 >> 12/26, 12/26>> 01/05 Trach 1/5 >>> A: Prolonged resp failure due to pneumococcal CAP R Pleural Effusion  Smoker Trach status P:   Cont vent support as needed- settings reviewed and/or adjusted Wean in PSV > ATC as tolrated Cont vent bundle Daily SBT if/when meets criteria   CARDIOVASCULAR Lines reviewed A:  Septic shock, resolved Sinus tachycardia  Possible relative adrenal insuff P:  MAP goal > 65 mmHg Decrease hydrocortisone Scheduled metoprolol PRN IV metoprolol to maintain HR < 115/min  RENAL A:   AKI, oliguric - likely ATN  P:   Monitor BMET intermittently Monitor I/Os Correct electrolytes as indicated CRRT to continue for now per Renal  GASTROINTESTINAL A:   Protein Calorie Malnutrition Dysphagia due  to trach tube P:   SUP: famotidine Cont TFs PEG planned per GI 01/09  HEMATOLOGIC A:   Macrocytic Anemia ICU associated anemia without overt blood loss Thrombocytosis, reactive P:  DVT px: SCDs Monitor CBC intermittently Transfuse per usual ICU guidelines  INFECTIOUS A:   Severe sepsis Pneumococcal PNA, treated Pneumococcal bacteremia, treated  Fever, leukocytosis without obvious source P:   - BCx2 12/21 >>strep pneumo sens - UC  12/21 >> ng - Sputum 12/21 >> moderate strep pneumoniae -  Blood 12/30>>> ng - Urine 12/30>>> ng - Sputum 12/30>>> abundant WBCs, no organisms - Pleural fluid culture 12/29>>> WBCs, no organisms - BC 1/6>>>   - Abx: Rocephin, start date 12/21>>>12/30. - Abx: Azithro, 12/21>>12/25 - Vancomycin 12/30>>>12/31, 1/6>> - Zosyn 12/30>>>1/4 - Ceftazidime 1/6>> 01/09  DC ceftaz Monitor off abx   ENDOCRINE A:   Rel AI P:   Taper HC as permitted by BP  NEUROLOGIC A:   Alcohol abuse Alcohol withdrawal seizures 12/23 Anxiety / Depression   P:   - RASS goal: 0 Cont Fentanyl patch Cont Thiamine  Change risperidone to quetiapine 01/09 Change scheduled lorazepam to PRN Clonazepam initiated 01/09 Change valproic acid to enteral 01/09 Low dose clonidine patch as adjunt    CCM X 40 mins  Merton Border, MD ; [facility] [phone].  After 5:30 PM or weekends, call [phone]

## 2014-02-03 NOTE — Progress Notes (Signed)
Patient ID: Allen Wood, male   DOB: [birth year], 44 y.o.   MRN: [id number]   [facility] Progress Note    Assessment/ Plan:   1. AKI: likely from ATN of sepsis associated with strep PNA. Oliguric and currently off pressors. I plan to continue CRRT until 4 PM today with ultrafiltration of -100 mL per hour and monitoring thereafter. 2. Strep PNA/VDRF: Ongoing chronic wean efforts now status post tracheostomy. Remains on antibiotic therapy Tressie Ellis) with mild leukocytosis 3. H/O EtOH abuse: Monitor for adjustment of sedation needs. 4. Anemia: With his borderline hemoglobin-concern raised with regards to GI bleeding, EGD today 5. Malnutrition: On tube feeds-awaiting EGD/PEG tube  Subjective:   No acute events overnight-plans in place for EGD/PEG tube placement today    Objective:   BP 134/86 mmHg  Pulse 125  Temp(Src) 97.8 F (36.6 C) (Axillary)  Resp 22  Ht 5\' 11"  (1.803 m)  Wt 75.2 kg (165 lb 12.6 oz)  BMI 23.13 kg/m2  SpO2 100%  Intake/Output Summary (Last 24 hours) at 02/03/14 0818 Last data filed at 02/03/14 0800  Gross per 24 hour  Intake 759.06 ml  Output   1745 ml  Net -985.94 ml   Weight change: -1 kg (-2 lb 3.3 oz)  Physical Exam: FTD:DUKGURK restless in bed- nods to questions YHC:WCBJS regular tachycardia Resp: Coarse/transmitted breath sounds bilaterally Abd: Soft, flat, nontender Ext: 2+ upper extremity edema, trace lower extremity edema. Scrotal edema noted  Imaging: Dg Abd 1 View  02/02/2014   CLINICAL DATA:  Abdominal distension  EXAM: ABDOMEN - 1 VIEW  COMPARISON:  01/31/2014  FINDINGS: There is nonspecific nonobstructive bowel gas pattern. Again noted small right pleural effusion with right basilar atelectasis or infiltrate. Feeding tube with tip in distal stomach. Some gas noted in distal transverse colon without significant colonic distention.  IMPRESSION: Nonspecific nonobstructive bowel gas pattern. Feeding tube with tip in distal stomach.  Some colonic gas noted in distal transverse colon without significant colonic distention. Again noted small right pleural effusion with right basilar atelectasis or infiltrate.   Electronically Signed   By: Lahoma Crocker M.D.   On: 02/02/2014 09:21    Labs: BMET  Recent Labs Lab 01/30/14 0413 01/30/14 1552 01/31/14 0223 01/31/14 1530 02/01/14 0605 02/01/14 1600 02/02/14 0330 02/02/14 1500  NA 137 135 137 138 137 136 136 135  K 3.6 4.3 4.0 4.3 3.9 4.3 4.4 4.9  CL 104 104 106 106 107 106 103 101  CO2 28 27 25 23 26 22 25 24   GLUCOSE 127* 107* 102* 93 104* 124* 88 94  BUN 10 12 9 9 8 7 6  <5*  CREATININE 1.19 1.53* 1.38* 1.38* 1.41* 1.29 1.31 1.43*  CALCIUM 7.9* 7.7* 7.5* 7.7* 7.9* 7.8* 8.0* 8.2*  PHOS 2.9 3.0 2.8 3.5 3.4 3.4  --  3.3   CBC  Recent Labs Lab 01/31/14 0223 02/02/14 0946 02/02/14 2000 02/03/14 0428  WBC 12.7* 13.9* 13.9* 17.6*  NEUTROABS  --   --  12.2*  --   HGB 7.0* 6.7* 7.0* 7.6*  HCT 21.2* 20.6* 21.3* 23.5*  MCV 104.4* 102.5* 102.4* 100.4*  PLT 641* 714* 644* 702*    Medications:    . antiseptic oral rinse  7 mL Mouth Rinse QID  . cefTAZidime (FORTAZ)  IV  2 g Intravenous Q12H  . chlorhexidine  15 mL Mouth Rinse BID  . darbepoetin (ARANESP) injection - NON-DIALYSIS  60 mcg Subcutaneous Q Fri-1800  . fentaNYL  75 mcg Transdermal Q72H  .  ferric gluconate (FERRLECIT/NULECIT) IV  250 mg Intravenous Q T,Th,S,Su  . folic acid  1 mg Oral Daily  . hydrocortisone sod succinate (SOLU-CORTEF) inj  50 mg Intravenous Q6H  . ipratropium-albuterol  3 mL Nebulization Q6H  . LORazepam  4 mg Intravenous Q4H  . metoCLOPramide  10 mg Oral Q6H  . multivitamin  5 mL Per Tube Daily  . pantoprazole sodium  40 mg Per Tube BID  . [START ON 02/15/2014] pantoprazole sodium  40 mg Per Tube Daily  . risperiDONE  2 mg Oral BID  . sertraline  50 mg Oral Daily  . thiamine  100 mg Oral Daily   Or  . thiamine  100 mg Intravenous Daily  . valproate sodium  500 mg Intravenous 3  times per day   Elmarie Shiley, MD 02/03/2014, 8:18 AM

## 2014-02-04 ENCOUNTER — Inpatient Hospital Stay (HOSPITAL_COMMUNITY): Payer: Medicaid Other

## 2014-02-04 DIAGNOSIS — D500 Iron deficiency anemia secondary to blood loss (chronic): Secondary | ICD-10-CM

## 2014-02-04 LAB — LACTATE DEHYDROGENASE, PLEURAL OR PERITONEAL FLUID: LD, Fluid: 170 U/L — ABNORMAL HIGH (ref 3–23)

## 2014-02-04 LAB — GLUCOSE, CAPILLARY
GLUCOSE-CAPILLARY: 130 mg/dL — AB (ref 70–99)
GLUCOSE-CAPILLARY: 48 mg/dL — AB (ref 70–99)
GLUCOSE-CAPILLARY: 90 mg/dL (ref 70–99)
GLUCOSE-CAPILLARY: 97 mg/dL (ref 70–99)
Glucose-Capillary: 65 mg/dL — ABNORMAL LOW (ref 70–99)
Glucose-Capillary: 93 mg/dL (ref 70–99)

## 2014-02-04 LAB — COMPREHENSIVE METABOLIC PANEL
ALT: 12 U/L (ref 0–53)
AST: 14 U/L (ref 0–37)
Albumin: 1.5 g/dL — ABNORMAL LOW (ref 3.5–5.2)
Alkaline Phosphatase: 58 U/L (ref 39–117)
Anion gap: 6 (ref 5–15)
BUN: 9 mg/dL (ref 6–23)
CO2: 27 mmol/L (ref 19–32)
CREATININE: 1.66 mg/dL — AB (ref 0.50–1.35)
Calcium: 8.7 mg/dL (ref 8.4–10.5)
Chloride: 104 mEq/L (ref 96–112)
GFR calc Af Amer: 56 mL/min — ABNORMAL LOW (ref >90)
GFR calc non Af Amer: 48 mL/min — ABNORMAL LOW (ref >90)
GLUCOSE: 74 mg/dL (ref 70–99)
POTASSIUM: 4.6 mmol/L (ref 3.5–5.1)
Sodium: 137 mmol/L (ref 135–145)
Total Bilirubin: 0.5 mg/dL (ref 0.3–1.2)
Total Protein: 5 g/dL — ABNORMAL LOW (ref 6.0–8.3)

## 2014-02-04 LAB — BODY FLUID CELL COUNT WITH DIFFERENTIAL
Eos, Fluid: 7 %
Lymphs, Fluid: 7 %
Monocyte-Macrophage-Serous Fluid: 9 % — ABNORMAL LOW (ref 50–90)
Neutrophil Count, Fluid: 77 % — ABNORMAL HIGH (ref 0–25)
Total Nucleated Cell Count, Fluid: 997 cu mm (ref 0–1000)

## 2014-02-04 LAB — CBC
HEMATOCRIT: 23.7 % — AB (ref 39.0–52.0)
Hemoglobin: 7.7 g/dL — ABNORMAL LOW (ref 13.0–17.0)
MCH: 32.5 pg (ref 26.0–34.0)
MCHC: 32.5 g/dL (ref 30.0–36.0)
MCV: 100 fL (ref 78.0–100.0)
Platelets: 708 10*3/uL — ABNORMAL HIGH (ref 150–400)
RBC: 2.37 MIL/uL — ABNORMAL LOW (ref 4.22–5.81)
RDW: 14.9 % (ref 11.5–15.5)
WBC: 15.5 10*3/uL — AB (ref 4.0–10.5)

## 2014-02-04 LAB — PROTEIN, BODY FLUID: Total protein, fluid: <3 g/dL

## 2014-02-04 LAB — APTT: APTT: 43 s — AB (ref 24–37)

## 2014-02-04 LAB — MAGNESIUM: MAGNESIUM: 2.3 mg/dL (ref 1.5–2.5)

## 2014-02-04 MED ORDER — LIDOCAINE HCL (PF) 1 % IJ SOLN
INTRAMUSCULAR | Status: AC
  Filled 2014-02-04: qty 10

## 2014-02-04 MED ORDER — METOPROLOL TARTRATE 25 MG/10 ML ORAL SUSPENSION
25.0000 mg | Freq: Two times a day (BID) | ORAL | Status: DC
  Administered 2014-02-04 – 2014-02-08 (×9): 25 mg
  Filled 2014-02-04 (×11): qty 10

## 2014-02-04 MED ORDER — CLONAZEPAM 1 MG PO TABS
2.0000 mg | ORAL_TABLET | Freq: Two times a day (BID) | ORAL | Status: DC
  Administered 2014-02-04 – 2014-02-08 (×8): 2 mg
  Filled 2014-02-04: qty 2
  Filled 2014-02-04: qty 4
  Filled 2014-02-04 (×2): qty 2
  Filled 2014-02-04 (×2): qty 4
  Filled 2014-02-04: qty 2
  Filled 2014-02-04: qty 4

## 2014-02-04 MED ORDER — PANTOPRAZOLE SODIUM 40 MG PO TBEC
40.0000 mg | DELAYED_RELEASE_TABLET | Freq: Every day | ORAL | Status: DC
  Administered 2014-02-04 – 2014-02-06 (×3): 40 mg via ORAL
  Filled 2014-02-04 (×2): qty 1

## 2014-02-04 MED ORDER — HYDROCORTISONE NA SUCCINATE PF 100 MG IJ SOLR
25.0000 mg | Freq: Two times a day (BID) | INTRAMUSCULAR | Status: DC
  Administered 2014-02-04 – 2014-02-05 (×2): 25 mg via INTRAVENOUS
  Filled 2014-02-04 (×2): qty 0.5

## 2014-02-04 NOTE — Procedures (Signed)
Successful US guided left thoracentesis. Yielded 1.2 liters of serous fluid. Pt tolerated procedure well. No immediate complications.  Specimen was sent for labs. CXR ordered.  Tsosie Billing D PA-C 02/04/2014 12:06 PM

## 2014-02-04 NOTE — Progress Notes (Signed)
Patient ID: Allen Wood, male   DOB: 14-Nov-1970, 44 y.o.   MRN: [id number]  [facility] Progress Note    Assessment/ Plan:   1. AKI: likely from ATN of sepsis associated with strep PNA. Oliguric and currently off pressors. CRRT discontinued overnight, monitor for intermittent dialysis needs-with his current mental status, I do not think he would be safe enough for outpatient dialysis even if weaned off the ventilator. 2. Strep PNA/VDRF: Ongoing chronic wean efforts now status post tracheostomy. Remains on antibiotic therapy Tressie Ellis) with mild leukocytosis 3. Encephalopathy: Appears to be metabolic encephalopathy associated with acute illness however confounded by preceding history of alcohol use. Unfortunately remains very confused and not very communicative. 4. Anemia: With his borderline hemoglobin-concern raised with regards to GI bleeding, EGD negative for GI source of bleeding 5. Malnutrition: On tube status post EGD and PEG tube placement yesterday  Subjective:   Remains confused and attempting to get out of his bed    Objective:   BP 129/78 mmHg  Pulse 118  Temp(Src) 98.7 F (37.1 C) (Oral)  Resp 28  Ht 5\' 11"  (1.803 m)  Wt 75.2 kg (165 lb 12.6 oz)  BMI 23.13 kg/m2  SpO2 97%  Intake/Output Summary (Last 24 hours) at 02/04/14 0829 Last data filed at 02/04/14 0600  Gross per 24 hour  Intake    430 ml  Output   2067 ml  Net  -1637 ml   Weight change:   Physical Exam: Gen: Awake but noncommunicative-attempting to crawl out of bed CVS: Pulse regular tachycardia Resp: Coarse breath sounds bilaterally Abd: Soft, flat, nontender Ext: No pedal edema. 2+ upper extremity edema  Imaging: Dg Abd 1 View  02/02/2014   CLINICAL DATA:  Abdominal distension  EXAM: ABDOMEN - 1 VIEW  COMPARISON:  01/31/2014  FINDINGS: There is nonspecific nonobstructive bowel gas pattern. Again noted small right pleural effusion with right basilar atelectasis or infiltrate. Feeding  tube with tip in distal stomach. Some gas noted in distal transverse colon without significant colonic distention.  IMPRESSION: Nonspecific nonobstructive bowel gas pattern. Feeding tube with tip in distal stomach. Some colonic gas noted in distal transverse colon without significant colonic distention. Again noted small right pleural effusion with right basilar atelectasis or infiltrate.   Electronically Signed   By: Lahoma Crocker M.D.   On: 02/02/2014 09:21   Dg Chest Port 1 View  02/04/2014   CLINICAL DATA:  Respiratory failure  EXAM: PORTABLE CHEST - 1 VIEW  COMPARISON:  01/31/2014  FINDINGS: Small to moderate layering bilateral pleural effusions. Associated bilateral lower lobe opacities, likely compressive atelectasis. No pneumothorax.  The heart is normal in size.  Tracheostomy in satisfactory position. Right subclavian venous catheter terminates at the cavoatrial junction. Left IJ venous catheter terminates at cavoatrial junction.  Enteric tube has been removed.  IMPRESSION: Small to moderate layering bilateral pleural effusions, increased.  Associated bilateral lower lobe opacities, likely compressive atelectasis.   Electronically Signed   By: Julian Hy M.D.   On: 02/04/2014 07:31    Labs: BMET  Recent Labs Lab 01/30/14 0413 01/30/14 1552 01/31/14 0223 01/31/14 1530 02/01/14 0605 02/01/14 1600 02/02/14 0330 02/02/14 1500 02/04/14 0433  NA 137 135 137 138 137 136 136 135 137  K 3.6 4.3 4.0 4.3 3.9 4.3 4.4 4.9 4.6  CL 104 104 106 106 107 106 103 101 104  CO2 28 27 25 23 26 22 25 24 27   GLUCOSE 127* 107* 102* 93 104* 124* 88 94  74  BUN 10 12 9 9 8 7 6  <5* 9  CREATININE 1.19 1.53* 1.38* 1.38* 1.41* 1.29 1.31 1.43* 1.66*  CALCIUM 7.9* 7.7* 7.5* 7.7* 7.9* 7.8* 8.0* 8.2* 8.7  PHOS 2.9 3.0 2.8 3.5 3.4 3.4  --  3.3  --    CBC  Recent Labs Lab 02/02/14 0946 02/02/14 2000 02/03/14 0428 02/04/14 0433  WBC 13.9* 13.9* 17.6* 15.5*  NEUTROABS  --  12.2*  --   --   HGB 6.7* 7.0*  7.6* 7.7*  HCT 20.6* 21.3* 23.5* 23.7*  MCV 102.5* 102.4* 100.4* 100.0  PLT 714* 644* 702* 708*    Medications:    . antiseptic oral rinse  7 mL Mouth Rinse QID  . chlorhexidine  15 mL Mouth Rinse BID  . clonazePAM  2 mg Per Tube Q8H  . cloNIDine  0.1 mg Transdermal Weekly  . darbepoetin (ARANESP) injection - NON-DIALYSIS  60 mcg Subcutaneous Q Fri-1800  . famotidine  20 mg Per Tube Daily  . fentaNYL  75 mcg Transdermal Q72H  . ferric gluconate (FERRLECIT/NULECIT) IV  250 mg Intravenous Q T,Th,S,Su  . folic acid  1 mg Per Tube Daily  . hydrocortisone sod succinate (SOLU-CORTEF) inj  50 mg Intravenous Q12H  . ipratropium-albuterol  3 mL Nebulization Q6H  . multivitamin  5 mL Per Tube Daily  . QUEtiapine  200 mg Per Tube BID  . thiamine  100 mg Oral Daily   Or  . thiamine  100 mg Intravenous Daily  . Valproic Acid  500 mg Per Tube BID   Elmarie Shiley, MD 02/04/2014, 8:29 AM

## 2014-02-04 NOTE — Progress Notes (Signed)
Daily Rounding Note  02/04/2014, 10:54 AM  LOS: 20 days   SUBJECTIVE:       Remains confused, agitated, attempting to get out of bed. Urine output up to 200 cc yesterday.  TF running at 75 ml per hour.   OBJECTIVE:         Vital signs in last 24 hours:    Temp:  [97.6 F (36.4 C)-98.7 F (37.1 C)] 98.7 F (37.1 C) (01/10 0737) Pulse Rate:  [103-132] 118 (01/10 0720) Resp:  [16-33] 28 (01/10 0720) BP: (108-152)/(63-106) 129/78 mmHg (01/10 0720) SpO2:  [90 %-100 %] 97 % (01/10 0720) FiO2 (%):  [28 %-60 %] 28 % (01/10 0720) Last BM Date: 02/04/14 Filed Weights   02/01/14 0344 02/02/14 0334 02/03/14 0204  Weight: 167 lb 8.8 oz (76 kg) 167 lb 15.9 oz (76.2 kg) 165 lb 12.6 oz (75.2 kg)   General: alert but not reallly focused on visitors.    Heart: RRR Chest: clear bil in front Abdomen: soft, NT-even at peg site.  Active BS. Scant amount of blood at peg site, very little blood on dressing.   Extremities: pitting edema in lower right arm, none in other limbs Neuro/Psych:  Follows simple commands.  Currently not agitated.   Intake/Output from previous day: 01/09 0701 - 01/10 0700 In: 440 [I.V.:280; NG/GT:100] Out: 2127 [Urine:200; Stool:300]  Intake/Output this shift:    Lab Results:  Recent Labs  02/02/14 2000 02/03/14 0428 02/04/14 0433  WBC 13.9* 17.6* 15.5*  HGB 7.0* 7.6* 7.7*  HCT 21.3* 23.5* 23.7*  PLT 644* 702* 708*   BMET  Recent Labs  02/02/14 0330 02/02/14 1500 02/04/14 0433  NA 136 135 137  K 4.4 4.9 4.6  CL 103 101 104  CO2 25 24 27   GLUCOSE 88 94 74  BUN 6 <5* 9  CREATININE 1.31 1.43* 1.66*  CALCIUM 8.0* 8.2* 8.7   LFT  Recent Labs  02/02/14 0330 02/02/14 1500 02/04/14 0433  PROT 5.3*  --  5.0*  ALBUMIN 1.5* 1.5* 1.5*  AST 16  --  14  ALT 14  --  12  ALKPHOS 57  --  58  BILITOT 0.4  --  0.5    Studies/Results: Dg Chest Port 1 View  02/04/2014   CLINICAL DATA:   Respiratory failure  EXAM: PORTABLE CHEST - 1 VIEW  COMPARISON:  01/31/2014  FINDINGS: Small to moderate layering bilateral pleural effusions. Associated bilateral lower lobe opacities, likely compressive atelectasis. No pneumothorax.  The heart is normal in size.  Tracheostomy in satisfactory position. Right subclavian venous catheter terminates at the cavoatrial junction. Left IJ venous catheter terminates at cavoatrial junction.  Enteric tube has been removed.  IMPRESSION: Small to moderate layering bilateral pleural effusions, increased.  Associated bilateral lower lobe opacities, likely compressive atelectasis.   Electronically Signed   By: Julian Hy M.D.   On: 02/04/2014 07:31   Scheduled Meds: . antiseptic oral rinse  7 mL Mouth Rinse QID  . chlorhexidine  15 mL Mouth Rinse BID  . clonazePAM  2 mg Per Tube BID  . cloNIDine  0.1 mg Transdermal Weekly  . darbepoetin (ARANESP) injection - NON-DIALYSIS  60 mcg Subcutaneous Q Fri-1800  . famotidine  20 mg Per Tube Daily  . fentaNYL  75 mcg Transdermal Q72H  . ferric gluconate (FERRLECIT/NULECIT) IV  250 mg Intravenous Q T,Th,S,Su  . folic acid  1 mg Per Tube Daily  . hydrocortisone  sod succinate (SOLU-CORTEF) inj  25 mg Intravenous Q12H  . ipratropium-albuterol  3 mL Nebulization Q6H  . metoprolol tartrate  25 mg Per Tube BID  . multivitamin  5 mL Per Tube Daily  . QUEtiapine  200 mg Per Tube BID  . thiamine  100 mg Oral Daily   Or  . thiamine  100 mg Intravenous Daily  . Valproic Acid  500 mg Per Tube BID   Continuous Infusions: . sodium chloride 10 mL/hr at 01/31/14 1319  . feeding supplement (VITAL HIGH PROTEIN) 1,000 mL (02/04/14 0600)  . dialysis replacement fluid (prismasate) 200 mL/hr at 02/03/14 0811  . dialysis replacement fluid (prismasate) 200 mL/hr at 02/03/14 0532  . dialysate (PRISMASATE) 1,500 mL/hr at 02/03/14 1643   PRN Meds:.acetaminophen (TYLENOL) oral liquid 160 mg/5 mL, bisacodyl, docusate **OR** docusate,  heparin, heparin, hydrALAZINE, LORazepam, metoprolol   ASSESMENT:   *  "Dysphagia", unable to take po due to recent trach/VDRF PEG placed 02/03/13.  No complications.  Tolerating full rate tube feeds.   *  FOBT +, anemia.  On ferrilicit, Aranesp.  EGD with duodenitis.  Currently on Pepcid 20 mg per day.     *  Strep pneumonia with resp failure  *  ARF.  Oliguric.  CRRT discontinued evening 1/9.    PLAN   *  Stop Pepcid and start daily Protonix. Daily PEG wound care as ordered 1/9   Azucena Freed  02/04/2014, 10:54 AM Pager: [phone]     Attending physician's note   I have taken an interval history, reviewed the chart and examined the patient. I agree with the Advanced Practitioner's note, impression and recommendations. Stable post PEG placement. TFs resumed without difficulty. Duodenitis, now on Protonix. Colonoscopy for further evaluation of heme + stool and anemia if/when he gets out of the ICU and he has demonstrated substantial clinical improvement. GI signing off.   Pricilla Riffle. Fuller Plan, MD [facility]

## 2014-02-04 NOTE — Progress Notes (Signed)
Patient taken off CRRT per Dr. Posey Pronto.

## 2014-02-04 NOTE — Progress Notes (Signed)
PULMONARY / CRITICAL CARE MEDICINE   Name: Allen Wood MRN: [id number] DOB: 10-30-1970    ADMISSION DATE:  01/15/2014 CONSULTATION DATE:  01/16/14  REFERRING MD :  Dr. Grandville Silos   CHIEF COMPLAINT:  Acute Respiratory Failure   INITIAL PRESENTATION: 44 y/o M, smoker / ETOH abuse, admitted 12/21 with ongoing fatigue, cough with productive yellow sputum, R posterior back pain, chills & sweats.  Work up found patient to have RLL PNA with positive strep antigen.  Developed worsening respiratory failure 12/22  STUDIES:  12/24 EEG >> neg 1/1 Echo >EF 60%, no wall abn 1/7 RUE Doppler>> neg for DVT 1/9 EGD>> Mild duodenitis in the duodenal bulb  SIGNIFICANT EVENTS: 12/21  Admit with RLL PNA 12/22  Decompensated, required intubation for respiratory failure in setting of PNA 12/23  Stable on vent, increased pleural effusion on CXR.  Levo @ 2 mcg, Fent 19mcg, Precedex 0.6.  Seizures overnight, Tmax 105 12/24  Concern for ileus with TF's coming out of mouth overnight.  Reglan dosing 12/26  Reintubated due to inability to clear secretions 12/30 Cr rising, UOP dropping. 12/31 Cr up to 1.9, UOP 250 ml over 24 hours, renal consult called 1/1 transfused 1 U pRBC;  1/1 Transferred to [facility] , CRRT per renal  1/2 Transfused 1 U pRBC 1/5 Trach placed 1/6 Hypotension, on Neo, fevers 1/9 EGD, PEG tube placed 1/10 Taken off CRRT per renal  SUBJECTIVE:  RASS +1. Tachycardic. On trach collar for 24 hours.   VITAL SIGNS: Temp:  [97.6 F (36.4 C)-98.5 F (36.9 C)] 98.3 F (36.8 C) (01/10 0434) Pulse Rate:  [103-147] 106 (01/10 0300) Resp:  [14-33] 17 (01/10 0300) BP: (108-152)/(63-106) 139/89 mmHg (01/10 0300) SpO2:  [90 %-100 %] 100 % (01/10 0300) FiO2 (%):  [40 %-60 %] 40 % (01/10 0300)   HEMODYNAMICS:     VENTILATOR SETTINGS: Vent Mode:  [-] PSV;CPAP FiO2 (%):  [40 %-60 %] 40 % PEEP:  [5 cmH20] 5 cmH20 Pressure Support:  [5 cmH20] 5 cmH20   INTAKE / OUTPUT:  Intake/Output Summary  (Last 24 hours) at 02/04/14 6761 Last data filed at 02/04/14 0200  Gross per 24 hour  Intake    410 ml  Output   2184 ml  Net  -1774 ml   PHYSICAL EXAMINATION: General: RASS +1 HEENT: NCAT PULM: coarse, no wheezes CV: tachycardic, no m/g/r Ab: BS+, soft, nontender Ext: no edema, warm Neuro: no focal deficits  LABS: I have reviewed all of today's lab results. Relevant abnormalities are discussed in the A/P section  CXR: NNF. Prior films reviewed  ASSESSMENT / PLAN:  PULMONARY OETT 12/22 >> 12/26, 12/26>> 01/05 Trach 1/5 >>> A: Prolonged resp failure due to pneumococcal CAP R Pleural Effusion  Smoker Trach status P:   Tolerating trach collar Left thoracentesis- therapeutic and diagnostic   CARDIOVASCULAR Lines reviewed A:  Septic shock, resolved Sinus tachycardia  Possible relative adrenal insuff P:  MAP goal > 65 mmHg Decrease hydrocortisone Scheduled metoprolol PRN IV metoprolol to maintain HR < 115/min  RENAL A:   AKI, oliguric - likely ATN  P:   Monitor BMET intermittently Monitor I/Os Correct electrolytes as indicated CRRT stopped per Renal  GASTROINTESTINAL A:   Protein Calorie Malnutrition Dysphagia due to trach tube Mild duodenitis- likely not source for bleeding PEG 1/9 P:   SUP: famotidine Feed per PEG  HEMATOLOGIC A:   Macrocytic Anemia ICU associated anemia without overt blood loss Thrombocytosis, reactive P:  DVT px: SCDs Monitor CBC  intermittently Transfuse per usual ICU guidelines  INFECTIOUS A:   Severe sepsis Pneumococcal PNA, treated Pneumococcal bacteremia, treated  Fever, leukocytosis without obvious source P:   - BCx2 12/21 >>strep pneumo sens - UC  12/21 >> ng - Sputum 12/21 >> moderate strep pneumoniae - Blood 12/30>>> ng - Urine 12/30>>> ng - Sputum 12/30>>> abundant WBCs, no organisms - Pleural fluid culture 12/29>>> WBCs, no organisms - BC 1/6>>>   - Abx: Rocephin, start date 12/21>>>12/30. - Abx:  Azithro, 12/21>>12/25 - Vancomycin 12/30>>>12/31, 1/6>> - Zosyn 12/30>>>1/4 - Ceftazidime 1/6>> 01/09  Afebrile, leukocytosis persists Continue to monitor off abx   ENDOCRINE A:   Rel AI P:   Taper HC as permitted by BP> decrease solu-cortef to 25 mg BID  NEUROLOGIC A:   Alcohol abuse Alcohol withdrawal seizures 12/23 Anxiety / Depression   P:   - RASS goal: 0 Cont Fentanyl patch Cont Thiamine  Continue quetiapine 200 mg BID Continue lorazepam 1-4 mg IV Q2H PRN Change Clonazepam to 2 mg BID Continue valproic acid 500 mg BID Continue Clonidine 0.1 mg patch Qweekly. Can take off if BP low.     Albin Felling, MD Internal Medicine Resident, PGY-1   PCCM ATTENDING: I have reviewed pt's initial presentation, consultants notes and hospital database in detail.  The above assessment and plan was formulated under my direction.  Merton Border, MD;  PCCM service; Mobile [phone]

## 2014-02-05 ENCOUNTER — Encounter (HOSPITAL_COMMUNITY): Payer: Self-pay | Admitting: Gastroenterology

## 2014-02-05 LAB — GLUCOSE, CAPILLARY
GLUCOSE-CAPILLARY: 102 mg/dL — AB (ref 70–99)
GLUCOSE-CAPILLARY: 138 mg/dL — AB (ref 70–99)
Glucose-Capillary: 99 mg/dL (ref 70–99)

## 2014-02-05 LAB — PATHOLOGIST SMEAR REVIEW

## 2014-02-05 MED ORDER — FERROUS SULFATE 220 (44 FE) MG/5ML PO ELIX
220.0000 mg | ORAL_SOLUTION | Freq: Two times a day (BID) | ORAL | Status: DC
  Filled 2014-02-05 (×2): qty 5

## 2014-02-05 MED ORDER — VITAMIN B-1 100 MG PO TABS
100.0000 mg | ORAL_TABLET | Freq: Every day | ORAL | Status: DC
  Administered 2014-02-05 – 2014-02-08 (×4): 100 mg
  Filled 2014-02-05 (×4): qty 1

## 2014-02-05 MED ORDER — FERROUS SULFATE 300 (60 FE) MG/5ML PO SYRP
300.0000 mg | ORAL_SOLUTION | Freq: Two times a day (BID) | ORAL | Status: DC
  Administered 2014-02-05 – 2014-02-08 (×7): 300 mg
  Filled 2014-02-05 (×8): qty 5

## 2014-02-05 MED ORDER — LORAZEPAM 2 MG/ML IJ SOLN: 1.0000 mg | INTRAMUSCULAR | Status: DC | PRN

## 2014-02-05 MED ORDER — NICOTINE 21 MG/24HR TD PT24
21.0000 mg | MEDICATED_PATCH | Freq: Every day | TRANSDERMAL | Status: DC
  Administered 2014-02-05 – 2014-02-08 (×4): 21 mg via TRANSDERMAL
  Filled 2014-02-05 (×4): qty 1

## 2014-02-05 NOTE — Evaluation (Signed)
Physical Therapy Evaluation Patient Details Name: Allen [name] MRN: [id number] DOB: 1970/11/16 Today's Date: 02/05/2014   History of Present Illness  44 y/o M, smoker / ETOH abuse, admitted 12/21 with ongoing fatigue, cough with productive yellow sputum, R posterior back pain, chills & sweats.  Work up found patient to have RLL PNA with positive strep antigen.  Developed worsening respiratory failure 12/22 with intubation, trach 1/5, thoracotomy 1/10, off CRRT 1/9  Clinical Impression  Pt able to follow some one step commands during eval. Pt able to stick out his tongue on command and raise RUE. Pt assisted with transfer sit<>supine but otherwise was limited by lethargy after receiving ativan. Pt will benefit from acute therapy to maximize strength, function, mobility and safety to decrease burden of care and return pt to independent level.      Follow Up Recommendations CIR    Equipment Recommendations  Rolling walker with 5" wheels    Recommendations for Other Services       Precautions / Restrictions Precautions Precautions: Fall Precaution Comments: trach, peg, flexiseal      Mobility  Bed Mobility Overal bed mobility: Needs Assistance Bed Mobility: Supine to Sit;Sit to Supine     Supine to sit: Max assist;HOB elevated Sit to supine: Max assist   General bed mobility comments: Pt lethargic and only able to open his eyes grossly 15 sec at at time. Max assist for lower body out of the bed with pt able to assist pushing his trunk up. With return to bed assist to control trunk and min assist to fully elevate legs onto surface. 2 person total assist to scoot to [place] transfer comment: unable at this time due to lethargy  Ambulation/Gait                Stairs            Wheelchair Mobility    Modified Rankin (Stroke Patients Only)       Balance Overall balance assessment: Needs assistance   Sitting  balance-Leahy Scale: Zero Sitting balance - Comments: Pt initially able to maintain trunk control EOB but within 4 sec excessive flexion with max assist to control. 2 min EOB then pt had to return to supine due to lethargy                                     Pertinent Vitals/Pain Pain Assessment:  (CPOT= 0)  HR 96 sats 95% on trach collar 28%    Home Living Family/patient expects to be discharged to:: Private residence Living Arrangements: Spouse/significant other Available Help at Discharge: Family;Available 24 hours/day Type of Home: House Home Access: Stairs to enter Entrance Stairs-Rails: Chemical engineer of Steps: 3 Home Layout: One level Home Equipment: None      Prior Function Level of Independence: Independent         Comments: PLOF provided by spouse     Hand Dominance        Extremity/Trunk Assessment   Upper Extremity Assessment: Generalized weakness           Lower Extremity Assessment: Generalized weakness         Communication   Communication: Tracheostomy  Cognition Arousal/Alertness: Lethargic Behavior During Therapy: Flat affect Overall Cognitive Status: Difficult to assess  General Comments      Exercises        Assessment/Plan    PT Assessment Patient needs continued PT services  PT Diagnosis Difficulty walking;Generalized weakness;Altered mental status   PT Problem List Decreased strength;Decreased activity tolerance;Decreased mobility;Decreased cognition;Decreased safety awareness;Decreased balance;Decreased knowledge of use of DME  PT Treatment Interventions Gait training;Therapeutic exercise;Therapeutic activities;Functional mobility training;Balance training;Neuromuscular re-education;Patient/family education;Cognitive remediation   PT Goals (Current goals can be found in the Care Plan section) Acute Rehab PT Goals Patient Stated Goal: return home PT Goal  Formulation: With family Time For Goal Achievement: 02/19/14 Potential to Achieve Goals: Good    Frequency Min 3X/week   Barriers to discharge        Co-evaluation               End of Session   Activity Tolerance: Patient limited by lethargy Patient left: in bed;with call bell/phone within [name];with nursing/sitter in room;with bed alarm set;with restraints reapplied Nurse Communication: Mobility status;Precautions         Time: 3546-5681 PT Time Calculation (min) (ACUTE ONLY): 21 min   Charges:   PT Evaluation $Initial PT Evaluation Tier I: 1 Procedure PT Treatments $Therapeutic Activity: 8-22 mins   PT G CodesMelford [name] 02/05/2014, 12:47 PM Allen [name], [place]

## 2014-02-05 NOTE — Progress Notes (Signed)
Notifed RN in the box about poor urine output and creatinine trending up.  She will make sure MD is aware.

## 2014-02-05 NOTE — Progress Notes (Signed)
Pt found on floor beside bed by NT.  RNs  immediately assessed patient and helped him stand from a kneeling position to get back into bed.  Pt able to lift arms above head bilaterally and lift both legs off bed.  No bruises seen.  No redness.  Pupils 20mm and reactive briskly bilaterally.  Pt shakes head "no" when asked if he is hurting anywhere.  He is alert. PEG, foley, and HD catheter remain intact.  No hematuria noted.  Dr. Joya Gaskins was notified of fall.  He assessed patient via elink and gave no orders.  Pt's wife, Geni Bers was notified of incident.

## 2014-02-05 NOTE — Progress Notes (Signed)
UR Completed.  [phone]  

## 2014-02-05 NOTE — Progress Notes (Signed)
[facility] ROUNDING NOTE   Subjective:   Interval History: following commands  Appears to be improving with better urine output  Objective:  Vital signs in last 24 hours:  Temp:  [97.6 F (36.4 C)-100.2 F (37.9 C)] 98 F (36.7 C) (01/11 0750) Pulse Rate:  [85-135] 109 (01/11 0711) Resp:  [12-37] 22 (01/11 0711) BP: (114-136)/(60-91) 121/75 mmHg (01/11 0711) SpO2:  [91 %-100 %] 100 % (01/11 0711) FiO2 (%):  [28 %-60 %] 28 % (01/11 0800)  Weight change:  Filed Weights   02/01/14 0344 02/02/14 0334 02/03/14 0204  Weight: 76 kg (167 lb 8.8 oz) 76.2 kg (167 lb 15.9 oz) 75.2 kg (165 lb 12.6 oz)    Intake/Output: I/O last 3 completed shifts: In: 2160 [I.V.:150; Other:2010] Out: 1771 [Urine:445; Other:1026; Stool:300]   Intake/Output this shift:  Total I/O In: 115 [Other:115] Out: -   CVS- RRR RS- CTA  trach ABD- BS present soft non-distended EXT- no edema  Foley tube Rectal tube   Basic Metabolic Panel:  Recent Labs Lab 01/31/14 0223 01/31/14 1530 02/01/14 0603 02/01/14 0605 02/01/14 1600 02/02/14 0330 02/02/14 1500 02/03/14 0428 02/04/14 0433  NA 137 138  --  137 136 136 135  --  137  K 4.0 4.3  --  3.9 4.3 4.4 4.9  --  4.6  CL 106 106  --  107 106 103 101  --  104  CO2 25 23  --  26 22 25 24   --  27  GLUCOSE 102* 93  --  104* 124* 88 94  --  74  BUN 9 9  --  8 7 6  <5*  --  9  CREATININE 1.38* 1.38*  --  1.41* 1.29 1.31 1.43*  --  1.66*  CALCIUM 7.5* 7.7*  --  7.9* 7.8* 8.0* 8.2*  --  8.7  MG 2.2  --  2.1  --   --  2.2  --  2.3 2.3  PHOS 2.8 3.5  --  3.4 3.4  --  3.3  --   --     Liver Function Tests:  Recent Labs Lab 02/01/14 0605 02/01/14 1600 02/02/14 0330 02/02/14 1500 02/04/14 0433  AST  --   --  16  --  14  ALT  --   --  14  --  12  ALKPHOS  --   --  57  --  58  BILITOT  --   --  0.4  --  0.5  PROT  --   --  5.3*  --  5.0*  ALBUMIN 1.4* 1.5* 1.5* 1.5* 1.5*   No results for input(s): LIPASE, AMYLASE in the last 168  hours. No results for input(s): AMMONIA in the last 168 hours.  CBC:  Recent Labs Lab 01/31/14 0223 02/02/14 0946 02/02/14 2000 02/03/14 0428 02/04/14 0433  WBC 12.7* 13.9* 13.9* 17.6* 15.5*  NEUTROABS  --   --  12.2*  --   --   HGB 7.0* 6.7* 7.0* 7.6* 7.7*  HCT 21.2* 20.6* 21.3* 23.5* 23.7*  MCV 104.4* 102.5* 102.4* 100.4* 100.0  PLT 641* 714* 644* 702* 708*    Cardiac Enzymes: No results for input(s): CKTOTAL, CKMB, CKMBINDEX, TROPONINI in the last 168 hours.  BNP: Invalid input(s): POCBNP  CBG:  Recent Labs Lab 02/04/14 1149 02/04/14 1630 02/04/14 2009 02/05/14 0017 02/05/14 0300  GLUCAP 93 97 130* 138* 102*    Microbiology: Results for orders placed or performed during the hospital encounter of  01/15/14  Blood culture (routine x 2)     Status: None   Collection Time: 01/15/14  4:09 PM  Result Value Ref Range Status   Specimen Description BLOOD RIGHT ANTECUBITAL  Final   Special Requests BOTTLES DRAWN AEROBIC AND ANAEROBIC 3 ML  Final   Culture  Setup Time   Final    01/16/2014 01:05 Performed at [facility]    Culture   Final    STREPTOCOCCUS PNEUMONIAE Note: Gram Stain Report Called to,Read Back By and Verified With: ANGELA ASHLEY 01/16/14 1550 BY [email or web address] Performed at [facility]    Report Status 01/30/2014 FINAL  Final   Organism ID, Bacteria STREPTOCOCCUS PNEUMONIAE  Final      Susceptibility   Streptococcus pneumoniae - MIC (ETEST)*    CEFTRIAXONE 0.023 SENSITIVE Sensitive     LEVOFLOXACIN 0.75 SENSITIVE Sensitive     PENICILLIN 0.023 SENSITIVE Sensitive     * STREPTOCOCCUS PNEUMONIAE  Blood culture (routine x 2)     Status: None   Collection Time: 01/15/14  4:09 PM  Result Value Ref Range Status   Specimen Description BLOOD BLOOD LEFT FOREARM  Final   Special Requests BOTTLES DRAWN AEROBIC AND ANAEROBIC 5 ML  Final   Culture  Setup Time   Final    01/16/2014 01:05 Performed at [facility]    Culture   Final     STREPTOCOCCUS PNEUMONIAE Note: SUSCEPTIBILITIES PERFORMED ON PREVIOUS CULTURE WITHIN THE LAST 5 DAYS. Note: Gram Stain Report Called to,Read Back By and Verified With: ANGELA ASHLEY 01/16/14 1550 BY SMITHERSJ Performed at [facility]    Report Status 01/30/2014 FINAL  Final  MRSA PCR Screening     Status: None   Collection Time: 01/15/14  8:24 PM  Result Value Ref Range Status   MRSA by PCR NEGATIVE NEGATIVE Final    Comment:        The GeneXpert MRSA Assay (FDA approved for NASAL specimens only), is one component of a comprehensive MRSA colonization surveillance program. It is not intended to diagnose MRSA infection nor to guide or monitor treatment for MRSA infections.   Culture, sputum-assessment     Status: None   Collection Time: 01/15/14  8:40 PM  Result Value Ref Range Status   Specimen Description SPUTUM  Final   Special Requests NONE  Final   Sputum evaluation   Final    THIS SPECIMEN IS ACCEPTABLE. RESPIRATORY CULTURE REPORT TO FOLLOW.   Report Status 01/15/2014 FINAL  Final  Culture, respiratory (NON-Expectorated)     Status: None   Collection Time: 01/15/14  8:40 PM  Result Value Ref Range Status   Specimen Description SPUTUM  Final   Special Requests NONE  Final   Gram Stain   Final    FEW WBC PRESENT,BOTH PMN AND MONONUCLEAR RARE SQUAMOUS EPITHELIAL CELLS PRESENT FEW GRAM POSITIVE COCCI IN PAIRS Performed at [facility]    Culture   Final    MODERATE STREPTOCOCCUS PNEUMONIAE Performed at [facility]    Report Status 01/19/2014 FINAL  Final   Organism ID, Bacteria STREPTOCOCCUS PNEUMONIAE  Final      Susceptibility   Streptococcus pneumoniae - MIC (ETEST)*    CEFTRIAXONE 0.016 SENSITIVE Sensitive     LEVOFLOXACIN 0.75 SENSITIVE Sensitive     PENICILLIN 0.023 SENSITIVE Sensitive     * MODERATE STREPTOCOCCUS PNEUMONIAE  Culture, respiratory (NON-Expectorated)     Status: None   Collection Time: 01/16/14 10:11 AM  Result  Value Ref Range Status   Specimen Description TRACHEAL ASPIRATE  Final   Special Requests Normal  Final   Gram Stain   Final    MODERATE WBC PRESENT, PREDOMINANTLY PMN NO SQUAMOUS EPITHELIAL CELLS SEEN FEW GRAM NEGATIVE COCCOBACILLI Performed at [facility]    Culture   Final    MODERATE STREPTOCOCCUS PNEUMONIAE Performed at [facility]    Report Status 01/20/2014 FINAL  Final   Organism ID, Bacteria STREPTOCOCCUS PNEUMONIAE  Final      Susceptibility   Streptococcus pneumoniae - MIC (ETEST)*    CEFTRIAXONE 0.023 SENSITIVE Sensitive     LEVOFLOXACIN 1.0 SENSITIVE Sensitive     PENICILLIN 0.023 SENSITIVE Sensitive     * MODERATE STREPTOCOCCUS PNEUMONIAE  Culture, Urine     Status: None   Collection Time: 01/16/14 10:48 PM  Result Value Ref Range Status   Specimen Description URINE, CATHETERIZED  Final   Special Requests NONE  Final   Culture  Setup Time   Final    01/17/2014 11:37 Performed at [place] Performed at [facility]   Final   Culture NO GROWTH Performed at [facility]   Final   Report Status 01/18/2014 FINAL  Final  Body fluid culture     Status: None   Collection Time: 01/23/14 12:14 PM  Result Value Ref Range Status   Specimen Description PLEURAL RIGHT  Final   Special Requests NONE  Final   Gram Stain   Final    CYTOSPIN SLIDE WBC PRESENT,BOTH PMN AND MONONUCLEAR NO ORGANISMS SEEN Performed at [facility]    Culture   Final    NO GROWTH 3 DAYS Performed at [facility]    Report Status 01/28/2014 FINAL  Final  AFB culture with smear     Status: None (Preliminary result)   Collection Time: 01/23/14 12:14 PM  Result Value Ref Range Status   Specimen Description PLEURAL RIGHT  Final   Special Requests NONE  Final   Acid Fast Smear   Final    NO ACID FAST BACILLI SEEN Performed at [facility]    Culture   Final    CULTURE WILL BE EXAMINED FOR 6  WEEKS BEFORE ISSUING A FINAL REPORT Performed at [facility]    Report Status PENDING  Incomplete  Fungus Culture with Smear     Status: None (Preliminary result)   Collection Time: 01/23/14 12:14 PM  Result Value Ref Range Status   Specimen Description PLEURAL RIGHT  Final   Special Requests NONE  Final   Fungal Smear   Final    NO YEAST OR FUNGAL ELEMENTS SEEN Performed at [facility]    Culture   Final    CULTURE IN PROGRESS FOR FOUR WEEKS Performed at [facility]    Report Status PENDING  Incomplete  Culture, blood (routine x 2)     Status: None   Collection Time: 01/24/14 10:00 AM  Result Value Ref Range Status   Specimen Description BLOOD LEFT WRIST  Final   Special Requests BOTTLES DRAWN AEROBIC ONLY [place]  Final   Culture   Final    NO GROWTH 5 DAYS Performed at [facility]    Report Status 01/30/2014 FINAL  Final  Culture, blood (routine x 2)     Status: None   Collection Time: 01/24/14 10:10 AM  Result Value Ref Range Status   Specimen  Description BLOOD RIGHT ARM  Final   Special Requests BOTTLES DRAWN AEROBIC AND ANAEROBIC 10CC  Final   Culture   Final    NO GROWTH 5 DAYS Note: Culture results may be compromised due to an inadequate volume of blood received in culture bottles. Performed at [facility]    Report Status 01/30/2014 FINAL  Final  Urine culture     Status: None   Collection Time: 01/24/14  5:04 PM  Result Value Ref Range Status   Specimen Description URINE, CATHETERIZED  Final   Special Requests NONE  Final   Colony Count NO GROWTH Performed at [facility]   Final   Culture NO GROWTH Performed at [facility]   Final   Report Status 01/28/2014 FINAL  Final  Culture, respiratory (NON-Expectorated)     Status: None   Collection Time: 01/25/14 12:11 AM  Result Value Ref Range Status   Specimen Description TRACHEAL ASPIRATE  Final   Special Requests NONE  Final   Gram Stain   Final     ABUNDANT WBC PRESENT,BOTH PMN AND MONONUCLEAR RARE SQUAMOUS EPITHELIAL CELLS PRESENT NO ORGANISMS SEEN Performed at [facility]    Culture   Final    Non-Pathogenic Oropharyngeal-type Flora Isolated. Performed at [facility]    Report Status 01/28/2014 FINAL  Final  Culture, blood (routine x 2)     Status: None (Preliminary result)   Collection Time: 01/31/14  9:26 AM  Result Value Ref Range Status   Specimen Description BLOOD RIGHT ARM  Final   Special Requests BOTTLES DRAWN AEROBIC ONLY 4CC  Final   Culture   Final           BLOOD CULTURE RECEIVED NO GROWTH TO DATE CULTURE WILL BE HELD FOR 5 DAYS BEFORE ISSUING A FINAL NEGATIVE REPORT Performed at [facility]    Report Status PENDING  Incomplete  Culture, blood (routine x 2)     Status: None (Preliminary result)   Collection Time: 01/31/14  9:44 AM  Result Value Ref Range Status   Specimen Description BLOOD RIGHT HAND  Final   Special Requests BOTTLES DRAWN AEROBIC ONLY 5CC  Final   Culture   Final           BLOOD CULTURE RECEIVED NO GROWTH TO DATE CULTURE WILL BE HELD FOR 5 DAYS BEFORE ISSUING A FINAL NEGATIVE REPORT Performed at [facility]    Report Status PENDING  Incomplete  Clostridium Difficile by PCR     Status: None   Collection Time: 01/31/14 11:27 AM  Result Value Ref Range Status   C difficile by pcr NEGATIVE NEGATIVE Final  Urine culture     Status: None   Collection Time: 02/01/14  9:32 AM  Result Value Ref Range Status   Specimen Description URINE, CATHETERIZED  Final   Special Requests NONE  Final   Colony Count NO GROWTH Performed at [facility]   Final   Culture NO GROWTH Performed at [facility]   Final   Report Status 02/02/2014 FINAL  Final  Body fluid culture     Status: None (Preliminary result)   Collection Time: 02/04/14 12:22 PM  Result Value Ref Range Status   Specimen Description PLEURAL FLUID LEFT  Final   Special Requests NONE   Final   Gram Stain   Final    FEW WBC PRESENT,BOTH PMN AND MONONUCLEAR NO ORGANISMS SEEN Performed at [facility]  Culture PENDING  Incomplete   Report Status PENDING  Incomplete    Coagulation Studies: No results for input(s): LABPROT, INR in the last 72 hours.  Urinalysis: No results for input(s): COLORURINE, LABSPEC, PHURINE, GLUCOSEU, HGBUR, BILIRUBINUR, KETONESUR, PROTEINUR, UROBILINOGEN, NITRITE, LEUKOCYTESUR in the last 72 hours.  Invalid input(s): APPERANCEUR    Imaging: Dg Chest Port 1 View  02/04/2014   CLINICAL DATA:  Post left-sided thoracentesis.  EXAM: PORTABLE CHEST - 1 VIEW  COMPARISON:  02/04/2014; 01/31/2014  FINDINGS: Grossly unchanged cardiac silhouette and mediastinal contours. Stable positioning of support apparatus. Interval reduction / near resolution of left-sided pleural effusion post thoracentesis. No pneumothorax. Unchanged small to moderate sized partially layering right-sided pleural effusion. Improved aeration of the left lower lung with persistent right mid and bibasilar heterogeneous/consolidative opacities. No new focal airspace opacities. Unchanged bones.  IMPRESSION: 1. Interval reduction/near resolution of left-sided pleural effusion post thoracentesis. No pneumothorax. 2. Stable position of support apparatus. 3. Unchanged small to moderate-sized partially layering right-sided effusion. 4. Improved aeration of the left lung with persistent right mid and bibasilar opacities, atelectasis versus infiltrate   Electronically Signed   By: Sandi Mariscal M.D.   On: 02/04/2014 13:14   Dg Chest Port 1 View  02/04/2014   CLINICAL DATA:  Respiratory failure  EXAM: PORTABLE CHEST - 1 VIEW  COMPARISON:  01/31/2014  FINDINGS: Small to moderate layering bilateral pleural effusions. Associated bilateral lower lobe opacities, likely compressive atelectasis. No pneumothorax.  The heart is normal in size.  Tracheostomy in satisfactory position. Right subclavian  venous catheter terminates at the cavoatrial junction. Left IJ venous catheter terminates at cavoatrial junction.  Enteric tube has been removed.  IMPRESSION: Small to moderate layering bilateral pleural effusions, increased.  Associated bilateral lower lobe opacities, likely compressive atelectasis.   Electronically Signed   By: Julian Hy M.D.   On: 02/04/2014 07:31   US Thoracentesis Asp Pleural Space W/img Guide  02/04/2014   INDICATION: Symptomatic left sided pleural effusion  EXAM: US THORACENTESIS ASP PLEURAL SPACE W/IMG GUIDE  COMPARISON:  Thoracentesis done by Pulmonology bedside 01/23/14 right sided 1 liter removed.  MEDICATIONS: None  COMPLICATIONS: None immediate  TECHNIQUE: Informed written consent was obtained from the patient's wife after a discussion of the risks, benefits and alternatives to treatment. A timeout was performed prior to the initiation of the procedure.  Initial ultrasound scanning demonstrates a left pleural effusion that is larger in size than a small right pleural effusion that has a loculated component. The lower chest was prepped and draped in the usual sterile fashion. 1% lidocaine was used for local anesthesia.  Under direct ultrasound guidance, a 19 gauge, 7-cm, Yueh catheter was introduced. An ultrasound image was saved for documentation purposes. The thoracentesis was performed. The catheter was removed and a dressing was applied. The patient tolerated the procedure well without immediate post procedural complication. Post procedure CXR has been ordered.  FINDINGS: A total of approximately 1.2 liters of serous fluid was removed. Requested samples were sent to the laboratory.  IMPRESSION: Successful ultrasound-guided left sided thoracentesis yielding 1.2 liters of pleural fluid.  Read By:  Tsosie Billing PA-C   Electronically Signed   By: Sandi Mariscal M.D.   On: 02/04/2014 12:29     Medications:   . sodium chloride 10 mL/hr at 01/31/14 1319  . feeding supplement  (VITAL HIGH PROTEIN) 1,000 mL (02/04/14 2102)  . dialysis replacement fluid (prismasate) 200 mL/hr at 02/03/14 0811  . dialysis replacement fluid (prismasate) 200  mL/hr at 02/03/14 0532  . dialysate (PRISMASATE) 1,500 mL/hr at 02/03/14 1643   . antiseptic oral rinse  7 mL Mouth Rinse QID  . chlorhexidine  15 mL Mouth Rinse BID  . clonazePAM  2 mg Per Tube BID  . cloNIDine  0.1 mg Transdermal Weekly  . darbepoetin (ARANESP) injection - NON-DIALYSIS  60 mcg Subcutaneous Q Fri-1800  . fentaNYL  75 mcg Transdermal Q72H  . ferric gluconate (FERRLECIT/NULECIT) IV  250 mg Intravenous Q T,Th,S,Su  . folic acid  1 mg Per Tube Daily  . hydrocortisone sod succinate (SOLU-CORTEF) inj  25 mg Intravenous Q12H  . ipratropium-albuterol  3 mL Nebulization Q6H  . metoprolol tartrate  25 mg Per Tube BID  . multivitamin  5 mL Per Tube Daily  . pantoprazole  40 mg Oral Q0600  . QUEtiapine  200 mg Per Tube BID  . thiamine  100 mg Oral Daily   Or  . thiamine  100 mg Intravenous Daily  . Valproic Acid  500 mg Per Tube BID   acetaminophen (TYLENOL) oral liquid 160 mg/5 mL, bisacodyl, docusate **OR** docusate, heparin, heparin, hydrALAZINE, LORazepam, metoprolol  Assessment/ Plan:  1. AKI: likely from ATN of sepsis associated with strep PNA. Oliguric and currently off pressors. Appears to be improved . No dialysis requirements  And anticipate return of renal function. I suggest that we sign off and would be happy to see patient back in consultation if things worsen 2. Strep PNA/VDRF: Ongoing chronic wean efforts now status post tracheostomy.  3. Encephalopathy: Appears to be metabolic encephalopathy associated with acute illness however confounded by preceding history of alcohol use. Unfortunately remains very confused and not very communicative. 4. Anemia: With his borderline hemoglobin-concern raised with regards to GI bleeding, EGD negative for GI source of bleeding 5. Malnutrition: On tube status post EGD  and PEG   Please call back if needed  [place] [phone]   LOS: 21 Suhailah Kwan W @TODAY @8 :43 AM

## 2014-02-05 NOTE — Progress Notes (Signed)
Rehab Admissions Coordinator Note:  Patient was screened by Cleatrice Burke for appropriateness for an Inpatient Acute Rehab Consult per PT recommendation  At this time, we are recommending Inpatient Rehab consult.  Please place order.  Cleatrice Burke 02/05/2014, 1:39 PM  I can be reached at [phone].

## 2014-02-05 NOTE — Progress Notes (Signed)
[facility] Progress Note Patient Name: Allen [name] DOB: May 04, 1970 MRN: [id number]   Date of Service  02/05/2014  HPI/Events of Note  Pt fell out of bed, no apparent injury  eICU Interventions  Sitter ordered to stay in room, had stepped out for a minute      Intervention Category Major Interventions: Delirium, psychosis, severe agitation - evaluation and management  Asencion Noble 02/05/2014, 7:16 PM

## 2014-02-05 NOTE — Progress Notes (Addendum)
PULMONARY / CRITICAL CARE MEDICINE   Name: Allen [name] MRN: [id number] DOB: 30-Jul-1970    ADMISSION DATE:  01/15/2014 CONSULTATION DATE:  01/16/14  REFERRING MD :  Dr. Grandville Silos   CHIEF COMPLAINT:  Acute Respiratory Failure   INITIAL PRESENTATION: 44 y/o M, smoker / ETOH abuse, admitted 12/21 with ongoing fatigue, cough with productive yellow sputum, R posterior back pain, chills & sweats.  Work up found patient to have RLL PNA with positive strep antigen.  Developed worsening respiratory failure 12/22  STUDIES:  12/24 EEG >> neg 1/1 Echo >EF 60%, no wall abn 1/7 RUE Doppler>> neg for DVT 1/9 EGD>> Mild duodenitis in the duodenal bulb 1/10 L Thora>> 1.2 L taken off  SIGNIFICANT EVENTS: 12/21  Admit with RLL PNA 12/22  Decompensated, required intubation for respiratory failure in setting of PNA 12/23  Stable on vent, increased pleural effusion on CXR.  Levo @ 2 mcg, Fent 119mcg, Precedex 0.6.  Seizures overnight, Tmax 105 12/24  Concern for ileus with TF's coming out of mouth overnight.  Reglan dosing 12/26  Reintubated due to inability to clear secretions 12/30 Cr rising, UOP dropping. 12/31 Cr up to 1.9, UOP 250 ml over 24 hours, renal consult called 1/1 transfused 1 U pRBC;  1/1 Transferred to [facility] , CRRT per renal  1/2 Transfused 1 U pRBC 1/5 Trach placed 1/6 Hypotension, on Neo, fevers 1/9 EGD, PEG tube placed 1/10 Taken off CRRT per renal 1/10 Left thoracentesis, 1.2 L drained. Fluis c/w transudate  SUBJECTIVE:  RASS -2 to +2. Tachycardic. Alert, awake, following commands. On trach collar for >48 hours.   VITAL SIGNS: Temp:  [97.6 F (36.4 C)-100.2 F (37.9 C)] 98.8 F (37.1 C) (01/11 0303) Pulse Rate:  [85-135] 97 (01/11 0600) Resp:  [12-37] 22 (01/11 0600) BP: (114-136)/(60-91) 132/83 mmHg (01/11 0600) SpO2:  [91 %-100 %] 100 % (01/11 0600) FiO2 (%):  [28 %-60 %] 40 % (01/11 0315)   HEMODYNAMICS:     VENTILATOR SETTINGS: Vent Mode:  [-]  FiO2 (%):   [28 %-60 %] 40 %   INTAKE / OUTPUT:  Intake/Output Summary (Last 24 hours) at 02/05/14 3559 Last data filed at 02/05/14 0600  Gross per 24 hour  Intake   1840 ml  Output    700 ml  Net   1140 ml   PHYSICAL EXAMINATION: General: RASS +2, alert, awake HEENT: NCAT, EOMI PULM: coarse, no wheezes CV: tachycardic, no m/g/r Ab: BS+, soft, nontender Ext: no edema, warm Neuro: no focal deficits, following commands  LABS: I have reviewed all of today's lab results. Relevant abnormalities are discussed in the A/P section  CXR: NNF. Prior films reviewed  ASSESSMENT / PLAN:  PULMONARY OETT 12/22 >> 12/26, 12/26>> 01/05 Trach 1/5 >>> A: Prolonged resp failure due to pneumococcal CAP R parapneumonic pleural effusion  Smoker Trach status L pleural effusion - transudate by thora 1/10 P:   Tolerating trach collar Pleural fluid>> neut 77, LDH 170, cx: few WBCs, no organisms>> transudate PT/OT   CARDIOVASCULAR Lines reviewed A:  Septic shock, resolved Sinus tachycardia  Possible relative adrenal insuff P:  MAP goal > 65 mmHg Discontinue hydrocortisone Continue sched metoprolol 25 mg BID PRN IV metoprolol to maintain HR < 115/min  RENAL A:   AKI, oliguric - likely ATN  P:   Monitor BMET intermittently Monitor I/Os Correct electrolytes as indicated CRRT stopped per Renal 1/10. Renal has signed off. Recommend to reconsult if additional dialysis needed.   GASTROINTESTINAL A:  Protein Calorie Malnutrition Dysphagia due to trach tube Mild duodenitis- likely not source for bleeding PEG 1/9 P:   SUP: famotidine Feed per PEG Will need colonoscopy once out of ICU or as outpatient for +FOBT/anemia  HEMATOLOGIC A:   Macrocytic Anemia ICU associated anemia without overt blood loss Thrombocytosis, reactive P:  DVT px: SCDs Monitor CBC intermittently Transfuse per usual ICU guidelines  INFECTIOUS A:   Severe sepsis Pneumococcal PNA, treated Pneumococcal  bacteremia, treated  Fever, leukocytosis without obvious source P:   - BCx2 12/21 >>strep pneumo sens - UC  12/21 >> ng - Sputum 12/21 >> moderate strep pneumoniae - Blood 12/30>>> ng - Urine 12/30>>> ng - Sputum 12/30>>> abundant WBCs, no organisms - Pleural fluid culture 12/29>>> WBCs, no organisms - BC 1/6>>> ng  - Abx: Rocephin, start date 12/21>>>12/30. - Abx: Azithro, 12/21>>12/25 - Vancomycin 12/30>>>12/31, 1/6>>1/8 - Zosyn 12/30>>>1/4 - Ceftazidime 1/6>> 01/09  Afebrile, leukocytosis persists Continue to monitor off abx   ENDOCRINE A:   Rel AI P:   Discontinue solu-cortef  NEUROLOGIC A:   Alcohol abuse Alcohol withdrawal seizures 12/23 Anxiety / Depression   P:   - RASS goal: 0 Cont Fentanyl patch Cont Thiamine  Continue quetiapine 200 mg BID Change lorazepam to 1 mg IV Q4H PRN Change Clonazepam to 2 mg BID Discontinue valproic acid 500 mg BID Continue Clonidine 0.1 mg patch Qweekly. Can take off if BP low.  Order PT/OT    Albin Felling, MD Internal Medicine Resident, PGY-1    PCCM ATTENDING: I have reviewed pt's initial presentation, consultants notes and hospital database in detail.  The above assessment and plan was formulated under my direction.  In summary: Pneumococcal PNA with prolonged, complicated hospital course Severe EtOH abuse PTA Severe delirium with agitation - much improved control  He is stable to transfer to SDU and will remain on PCCM service Rest of plan is reflected accurately in the notes above His Uo is improving but it is not entirely clear that he will not need further HD. Renal service might need to be called back on for HD  Merton Border, MD;  PCCM service; Mobile [phone]

## 2014-02-06 DIAGNOSIS — Z930 Tracheostomy status: Secondary | ICD-10-CM

## 2014-02-06 LAB — BASIC METABOLIC PANEL
Anion gap: 7 (ref 5–15)
BUN: 36 mg/dL — ABNORMAL HIGH (ref 6–23)
CO2: 27 mmol/L (ref 19–32)
Calcium: 9.5 mg/dL (ref 8.4–10.5)
Chloride: 104 mEq/L (ref 96–112)
Creatinine, Ser: 1.16 mg/dL (ref 0.50–1.35)
GFR calc Af Amer: 86 mL/min — ABNORMAL LOW (ref >90)
GFR calc non Af Amer: 75 mL/min — ABNORMAL LOW (ref >90)
GLUCOSE: 103 mg/dL — AB (ref 70–99)
POTASSIUM: 3.6 mmol/L (ref 3.5–5.1)
Sodium: 138 mmol/L (ref 135–145)

## 2014-02-06 LAB — CBC
HEMATOCRIT: 23.3 % — AB (ref 39.0–52.0)
Hemoglobin: 7.8 g/dL — ABNORMAL LOW (ref 13.0–17.0)
MCH: 33.5 pg (ref 26.0–34.0)
MCHC: 33.5 g/dL (ref 30.0–36.0)
MCV: 100 fL (ref 78.0–100.0)
PLATELETS: 686 10*3/uL — AB (ref 150–400)
RBC: 2.33 MIL/uL — ABNORMAL LOW (ref 4.22–5.81)
RDW: 14.8 % (ref 11.5–15.5)
WBC: 16.3 10*3/uL — ABNORMAL HIGH (ref 4.0–10.5)

## 2014-02-06 LAB — CULTURE, BLOOD (ROUTINE X 2)
CULTURE: NO GROWTH
CULTURE: NO GROWTH

## 2014-02-06 LAB — CG4 I-STAT (LACTIC ACID): Lactic Acid, Venous: 0.4 mmol/L — ABNORMAL LOW (ref 0.5–2.2)

## 2014-02-06 MED ORDER — BACITRACIN-NEOMYCIN-POLYMYXIN OINTMENT TUBE
1.0000 "application " | TOPICAL_OINTMENT | Freq: Every day | CUTANEOUS | Status: DC
  Administered 2014-02-06 – 2014-02-08 (×3): 1 via TOPICAL
  Filled 2014-02-06: qty 15

## 2014-02-06 MED ORDER — VITAL HIGH PROTEIN PO LIQD
1000.0000 mL | ORAL | Status: AC
  Administered 2014-02-06: 1000 mL
  Filled 2014-02-06 (×6): qty 1000

## 2014-02-06 NOTE — Progress Notes (Signed)
Patient was placed on treatment and treatment was started. Patient wanted RT to remove treatment before it was done. Patient did not finish breathing treatment.

## 2014-02-06 NOTE — Progress Notes (Signed)
Physical Therapy Treatment Patient Details Name: Allen Wood MRN: [id number] DOB: [birth year] Today's Date: 02/06/2014    History of Present Illness 44 y/o M, smoker / ETOH abuse, admitted 12/21 with ongoing fatigue, cough with productive yellow sputum, R posterior back pain, chills & sweats.  Work up found patient to have RLL PNA with positive strep antigen.  Developed worsening respiratory failure 12/22 with intubation, trach 1/5, thoracotomy 1/10, off CRRT 1/9    PT Comments    Pt alert, following commands and moving well although proximal weakness of bil UE and bil LE noted. Pt with cues for trunk control with increased HR and need for bump in FiO2 to 35% with activity. Pt able to stand and walk short distance today with chair followed and max cues. Pt remains a great CIR candidate and will continue to follow acutely. Pt very motivated to walk, eat and return home. Wife present throughout session. Pt coughing up thick secretions throughout.  Follow Up Recommendations  CIR     Equipment Recommendations       Recommendations for Other Services OT consult;Speech consult;Rehab consult     Precautions / Restrictions Precautions Precautions: Fall Precaution Comments: trach, peg, flexiseal    Mobility  Bed Mobility Overal bed mobility: Needs Assistance Bed Mobility: Supine to Sit     Supine to sit: Min guard     General bed mobility comments: cues for sequence, safety and lines with pt needing cues to decrease speed of transfer  Transfers Overall transfer level: Needs assistance   Transfers: Sit to/from Stand Sit to Stand: Mod assist;+2 safety/equipment         General transfer comment: cues for hand placement, sequence and safety from bed and chair  Ambulation/Gait Ambulation/Gait assistance: Min assist;+2 safety/equipment Ambulation Distance (Feet): 15 Feet Assistive device: Rolling walker (2 wheeled) Gait Pattern/deviations: Narrow base of support;Trunk  flexed;Shuffle   Gait velocity interpretation: Below normal speed for age/gender General Gait Details: max cues for posture, position in RW, increased BOS and increased stride. Chair to follow. Pt HR up to 138 with gait with activity terminated due to tachycardia with sats 94% on 35% FiO2 trach collar   Stairs            Wheelchair Mobility    Modified Rankin (Stroke Patients Only)       Balance Overall balance assessment: Needs assistance   Sitting balance-Leahy Scale: Fair       Standing balance-Leahy Scale: Fair                      Cognition Arousal/Alertness: Awake/alert Behavior During Therapy: WFL for tasks assessed/performed   Area of Impairment: Orientation Orientation Level: Disoriented to;Place;Time                  Exercises      General Comments        Pertinent Vitals/Pain Pain Assessment: No/denies pain    Home Living                      Prior Function            PT Goals (current goals can now be found in the care plan section) Progress towards PT goals: Goals met and updated - see care plan    Frequency       PT Plan Current plan remains appropriate    Co-evaluation             End of Session  Equipment Utilized During Treatment: Gait belt;Oxygen Activity Tolerance: Patient tolerated treatment well Patient left: in chair;with call bell/phone within reach;with nursing/sitter in room;with family/visitor present     Time: 0825-0905 PT Time Calculation (min) (ACUTE ONLY): 40 min  Charges:  $Gait Training: 8-22 mins $Therapeutic Activity: 23-37 mins                    G Codes:      Melford Aase 02-15-2014, 9:15 AM Elwyn Reach, [place]

## 2014-02-06 NOTE — Consult Note (Signed)
Physical Medicine and Rehabilitation Consult Reason for Consult: Severe debilitation/acute respiratory failure Referring Physician: Critical care   HPI: Allen Wood is a 44 y.o. right handed male with history of alcohol and tobacco abuse and recently treated in August 2015 for left lower lobe pneumonia. By report patient lives with his wife and was independent prior to admission. Admitted 01/15/2014 with productive cough and shortness of breath as well as low-grade fever with chills. Chest x-ray with consolidation of much of the right lower lobe along with suspected right pleural effusion. White blood cell count 10,500. Alcohol level negative. Patient with positive strep antigen. Maintained on broad-spectrum antibiotics. Patient with progressive respiratory failure requiring intubation. Neurology services consulted 01/18/2014 with reported seizure. Cranial CT scan negative. He was loaded with Keppra. EEG was unremarkable. Question seizure related to alcohol withdrawal. Underwent thoracentesis of right-sided pleural effusion with 1000 mL of pleural fluid obtained and later with findings of left pleural effusion with 1.2 L obtained after thoracentesis 02/04/2014. Renal service is consulted 01/25/2014 for progressive increasing creatinine of 1.47-1.91. Renal ultrasound was unremarkable. Echocardiogram completed with ejection fraction of 65% no wall motion abnormalities. Patient with prolonged ventilatory support necessitating tracheostomy tube placement 01/30/2013 per Dr. Titus Mould as well as PEG tube placement for nutritional support 01/31/2014 per Dr. Lucio Edward.. Patient currently remains nothing by mouth. Renal function has slowly improved placed creatinine 1.16. Reported fall found on the floor of bedside 02/05/2014 without reported injury and a sitter was provided for safety. Physical therapy evaluation completed 02/05/2014 with recommendations of physical medicine rehabilitation  consult.  Patient up for the first time with physical therapy today. Rectal tube, Foley, trach collar. O2 turned up to 35% to maintain sats during therapy. Heart rate 130s with sit to stand. Two-person assist. Patient able to march in place for short period of time holding onto the walker and with therapy support Good sitting balance in recliner  Review of Systems  Unable to perform ROS: mental acuity   Past Medical History  Diagnosis Date  . Alcohol abuse   . Depression   . Anxiety   . Pneumonia    Past Surgical History  Procedure Laterality Date  . Mouth surgery      teeth removed.  . Tracheostomy  01/30/14    feinstein  . Esophagogastroduodenoscopy N/A 02/03/2014    Procedure: ESOPHAGOGASTRODUODENOSCOPY (EGD);  Surgeon: Ladene Artist, MD;  Location[facility] ENDOSCOPY;  Service: Endoscopy;  Laterality: N/A;  bedside/trach/vent  . Peg placement N/A 02/03/2014    Procedure: PERCUTANEOUS ENDOSCOPIC GASTROSTOMY (PEG) PLACEMENT;  Surgeon: Ladene Artist, MD;  Location: [facility] ENDOSCOPY;  Service: Endoscopy;  Laterality: N/A;   Family History  Problem Relation Age of Onset  . Cancer Mother     lung  . Cancer Father     kidney   Social History:  reports that he has been smoking Cigarettes.  He has a 10 pack-year smoking history. He has never used smokeless tobacco. He reports that he drinks alcohol. He reports that he does not use illicit drugs. Allergies: No Known Allergies Medications Prior to Admission  Medication Sig Dispense Refill  . buPROPion (WELLBUTRIN XL) 150 MG 24 hr tablet Take 150 mg by mouth every morning.    . sertraline (ZOLOFT) 50 MG tablet Take 50 mg by mouth at bedtime.    Marland Kitchen azithromycin (ZITHROMAX) 500 MG tablet Take 1 tablet (500 mg total) by mouth daily. (Patient not taking: Reported on 01/15/2014) 5 tablet 0  .  predniSONE (DELTASONE) 10 MG tablet Takes 6 tablets for 1 days, then 5 tablets for 1 days, then 4 tablets for 1 days, then 3 tablets for 1 days, then 2 tabs  for 1 days, then 1 tab for 1 days, and then stop. (Patient not taking: Reported on 01/15/2014) 21 tablet 0    Home: [place] expects to be discharged to:: Private residence Living Arrangements: Spouse/significant other Available Help at Discharge: Family, Available 24 hours/day Type of Home: House Home Access: Stairs to enter Technical brewer of Steps: 3 Entrance Stairs-Rails: Left, Right Home Layout: One level Home Equipment: None  Functional History: Prior Function Level of Independence: Independent Comments: PLOF provided by spouse Functional Status:  Mobility: Bed Mobility Overal bed mobility: Needs Assistance Bed Mobility: Supine to Sit, Sit to Supine Supine to sit: Max assist, HOB elevated Sit to supine: Max assist General bed mobility comments: Pt lethargic and only able to open his eyes grossly 15 sec at at time. Max assist for lower body out of the bed with pt able to assist pushing his trunk up. With return to bed assist to control trunk and min assist to fully elevate legs onto surface. 2 person total assist to scoot to [place] transfer comment: unable at this time due to lethargy      ADL:    Cognition: Cognition Overall Cognitive Status: Difficult to assess Orientation Level: Intubated/Tracheostomy - Unable to assess Cognition Arousal/Alertness: Lethargic Behavior During Therapy: Flat affect Overall Cognitive Status: Difficult to assess Difficult to assess due to: Tracheostomy, Level of arousal  Blood pressure 134/88, pulse 107, temperature 99.4 F (37.4 C), temperature source Oral, resp. rate 20, height 5\' 11"  (1.803 m), weight 75.2 kg (165 lb 12.6 oz), SpO2 94 %. Physical Exam  HENT:  Head: Normocephalic.  Eyes: EOM are normal.  No nystagmus  Neck:  Tracheostomy tube in place  Cardiovascular: Normal rate and regular rhythm.   Respiratory:  Decreased breath sounds at the bases with limited inspiratory effort   GI: Soft. Bowel sounds are normal.  PEG tube intact  Neurological: He is alert.  Patient appears a bit restless. He does make eye contact with examiner. He is appropriate for yes and no head nods for general questions. He does follow simple commands but was easily distracted  Motor strength is 3/5 bilateral deltoid 4 at bilateral biceps triceps and grip 3+ bilateral hip abduction and hip adduction, 4 minus bilateral hip flexion, 4 at knee extensors ankle dorsiflexor and plantar flexor Sensation appears intact to light touch in the upper limbs.  Results for orders placed or performed during the hospital encounter of 01/15/14 (from the past 24 hour(s))  Glucose, capillary     Status: None   Collection Time: 02/05/14  7:48 AM  Result Value Ref Range   Glucose-Capillary 99 70 - 99 mg/dL  Basic metabolic panel     Status: Abnormal   Collection Time: 02/06/14  4:45 AM  Result Value Ref Range   Sodium 138 135 - 145 mmol/L   Potassium 3.6 3.5 - 5.1 mmol/L   Chloride 104 96 - 112 mEq/L   CO2 27 19 - 32 mmol/L   Glucose, Bld 103 (H) 70 - 99 mg/dL   BUN 36 (H) 6 - 23 mg/dL   Creatinine, Ser 1.16 0.50 - 1.35 mg/dL   Calcium 9.5 8.4 - 10.5 mg/dL   GFR calc non Af Amer 75 (L) >90 mL/min   GFR calc Af Amer 86 (L) >90  mL/min   Anion gap 7 5 - 15  CBC     Status: Abnormal   Collection Time: 02/06/14  4:45 AM  Result Value Ref Range   WBC 16.3 (H) 4.0 - 10.5 K/uL   RBC 2.33 (L) 4.22 - 5.81 MIL/uL   Hemoglobin 7.8 (L) 13.0 - 17.0 g/dL   HCT 23.3 (L) 39.0 - 52.0 %   MCV 100.0 78.0 - 100.0 fL   MCH 33.5 26.0 - 34.0 pg   MCHC 33.5 30.0 - 36.0 g/dL   RDW 14.8 11.5 - 15.5 %   Platelets 686 (H) 150 - 400 K/uL   Dg Chest Port 1 View  02/04/2014   CLINICAL DATA:  Post left-sided thoracentesis.  EXAM: PORTABLE CHEST - 1 VIEW  COMPARISON:  02/04/2014; 01/31/2014  FINDINGS: Grossly unchanged cardiac silhouette and mediastinal contours. Stable positioning of support apparatus. Interval reduction / near  resolution of left-sided pleural effusion post thoracentesis. No pneumothorax. Unchanged small to moderate sized partially layering right-sided pleural effusion. Improved aeration of the left lower lung with persistent right mid and bibasilar heterogeneous/consolidative opacities. No new focal airspace opacities. Unchanged bones.  IMPRESSION: 1. Interval reduction/near resolution of left-sided pleural effusion post thoracentesis. No pneumothorax. 2. Stable position of support apparatus. 3. Unchanged small to moderate-sized partially layering right-sided effusion. 4. Improved aeration of the left lung with persistent right mid and bibasilar opacities, atelectasis versus infiltrate   Electronically Signed   By: Sandi Mariscal M.D.   On: 02/04/2014 13:14   US Thoracentesis Asp Pleural Space W/img Guide  02/04/2014   INDICATION: Symptomatic left sided pleural effusion  EXAM: US THORACENTESIS ASP PLEURAL SPACE W/IMG GUIDE  COMPARISON:  Thoracentesis done by Pulmonology bedside 01/23/14 right sided 1 liter removed.  MEDICATIONS: None  COMPLICATIONS: None immediate  TECHNIQUE: Informed written consent was obtained from the patient's wife after a discussion of the risks, benefits and alternatives to treatment. A timeout was performed prior to the initiation of the procedure.  Initial ultrasound scanning demonstrates a left pleural effusion that is larger in size than a small right pleural effusion that has a loculated component. The lower chest was prepped and draped in the usual sterile fashion. 1% lidocaine was used for local anesthesia.  Under direct ultrasound guidance, a 19 gauge, 7-cm, Yueh catheter was introduced. An ultrasound image was saved for documentation purposes. The thoracentesis was performed. The catheter was removed and a dressing was applied. The patient tolerated the procedure well without immediate post procedural complication. Post procedure CXR has been ordered.  FINDINGS: A total of approximately  1.2 liters of serous fluid was removed. Requested samples were sent to the laboratory.  IMPRESSION: Successful ultrasound-guided left sided thoracentesis yielding 1.2 liters of pleural fluid.  Read By:  Tsosie Billing PA-C   Electronically Signed   By: Sandi Mariscal M.D.   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 Does the need for close, 24 hr/day medical supervision in concert with the patient's rehab needs make it unreasonable for this patient to be served in a less intensive setting? Yes 2. Co-Morbidities requiring supervision/potential complications: Status post tracheostomy, dysphagia, status post gastrostomy, alcohol abuse with post-withdrawal encephalopathy 3. Due to bladder management, bowel management, safety, skin/wound care, disease management, medication administration, pain management and patient education, does the patient require 24 hr/day rehab nursing? Yes 4. Does the patient require coordinated care of a physician, rehab nurse, PT (1-2 hrs/day, 55 days/week),  OT (1-2 hrs/day, 5 days/week) and SLP (0.5-1 hrs/day, 5 days/week) to address physical and functional deficits in the context of the above medical diagnosis(es)? Yes Addressing deficits in the following areas: balance, endurance, locomotion, strength, transferring, bowel/bladder control, bathing, dressing, feeding, grooming, toileting, cognition, speech, language, swallowing and psychosocial support 5. Can the patient actively participate in an intensive therapy program of at least 3 hrs of therapy per day at least 5 days per week? No 6. The potential for patient to make measurable gains while on inpatient rehab is should be good once able to tolerate intensive program 7. Anticipated functional outcomes upon discharge from inpatient rehab are supervision  with PT, supervision with OT, supervision with SLP. 8. Estimated rehab length of  stay to reach the above functional goals is: 17-20 days 9. Does the patient have adequate social supports and living environment to accommodate these discharge functional goals? Yes 10. Anticipated D/C setting: Home 11. Anticipated post D/C treatments: [place] therapy 12. Overall Rehab/Functional Prognosis: good  RECOMMENDATIONS: This patient's condition is appropriate for continued rehabilitative care in the following setting: CIR Patient has agreed to participate in recommended program. Potentially Note that insurance prior authorization may be required for reimbursement for recommended care.  Comment: Will need rectal tube out, heart rate will need stay less than 120 with therapy, will need to tolerate up in chair 3 hours per day. Should be ready in 3-6 days    02/06/2014

## 2014-02-06 NOTE — Evaluation (Signed)
Passy-Muir Speaking Valve - Evaluation Patient Details  Name: Allen Wood MRN: [id number] Date of Birth: July 05, 1970  Today's Date: 02/06/2014 Time: 1035-1055 SLP Time Calculation (min) (ACUTE ONLY): 20 min  Past Medical History:  Past Medical History  Diagnosis Date  . Alcohol abuse   . Depression   . Anxiety   . Pneumonia    Past Surgical History:  Past Surgical History  Procedure Laterality Date  . Mouth surgery      teeth removed.  . Tracheostomy  01/30/14    feinstein  . Esophagogastroduodenoscopy N/A 02/03/2014    Procedure: ESOPHAGOGASTRODUODENOSCOPY (EGD);  Surgeon: Ladene Artist, MD;  Location: [facility] ENDOSCOPY;  Service: Endoscopy;  Laterality: N/A;  bedside/trach/vent  . Peg placement N/A 02/03/2014    Procedure: PERCUTANEOUS ENDOSCOPIC GASTROSTOMY (PEG) PLACEMENT;  Surgeon: Ladene Artist, MD;  Location: [facility] ENDOSCOPY;  Service: Endoscopy;  Laterality: N/A;   HPI:  44 y/o M, smoker / ETOH abuse, admitted 12/21 with ongoing fatigue, cough with productive yellow sputum, R posterior back pain, chills & sweats. Work up found patient to have RLL PNA with positive strep antigen. Developed worsening respiratory failure 12/22 with intubation, trach 1/5, thoracotomy 1/10, off CRRT 1/9.  Currently with #8 cuffed Shiley; trach collar.    Assessment / Plan / Recommendation Clinical Impression  Pt with groggy MS, requiring cues to focus and sustain attention for PMV trials.  Tolerated cuff deflation.  Placement of valve stimulated cough, production of copious secretions.  Valve placed for intervals of 4-6 respiratory cycles, then removed when agitation increased or coughing created discomfort.  Pt followed commands inconsistently to verbalize in imitation - efforts at voicing inhibited by secretions; voice aphonic.  SpO2 remained low 90s%; RR 12: HR 100.  Valve removed; cuff reinflated.  Wife educated re: function and purpose of PMV.  Recommend PMV trials with SLP only at this time.  Will  follow.  Not yet ready for swallowing assessment.      SLP Assessment  Patient needs continued Speech Lanaguage Pathology Services    Follow Up Recommendations  Inpatient Rehab    Frequency and Duration min 3x week  2 weeks       SLP Goals Potential to Achieve Goals (ACUTE ONLY): Good   PMSV Trial  PMSV was placed for: intervals of 4-6 respiratory cycles Able to redirect subglottic air through upper airway: Yes Able to Attain Phonation: No Voice Quality: Aphonic;Wet Able to Expectorate Secretions: Yes Level of Secretion Expectoration with PMSV: Tracheal Breath Support for Phonation: Inadequate Intelligibility: Intelligibility reduced Word: 0-24% accurate Phrase: 0-24% accurate Respirations During Trial: 12 SpO2 During Trial: 93 % Pulse During Trial: 100 Behavior: Alert;Confused;Anxious   Tracheostomy Tube    #8 Shiley with cuff   Vent Dependency  FiO2 (%): 35 %    Cuff Deflation Trial Tolerated Cuff Deflation: Yes Length of Time for Cuff Deflation Trial: 15 Behavior: Alert;Confused   Juan Quam Laurice 02/06/2014, 11:10 AM Estill Bamberg L. Tivis Ringer, [place] CCC/SLP Pager [phone]

## 2014-02-06 NOTE — Progress Notes (Signed)
PULMONARY / CRITICAL CARE MEDICINE   Name: Allen Wood MRN: [id number] DOB: Jul 26, 1970    ADMISSION DATE:  01/15/2014 CONSULTATION DATE:  01/16/14  REFERRING MD :  Dr. Grandville Silos   CHIEF COMPLAINT:  Acute Respiratory Failure   INITIAL PRESENTATION: 44 y/o M, smoker / ETOH abuse, admitted 12/21 with ongoing fatigue, cough with productive yellow sputum, R posterior back pain, chills & sweats.  Work up found patient to have RLL PNA with positive strep antigen.  Developed worsening respiratory failure 12/22  STUDIES:  12/24 EEG >> neg 1/1 Echo >EF 60%, no wall abn 1/7 RUE Doppler>> neg for DVT 1/9 EGD>> Mild duodenitis in the duodenal bulb 1/10 L Thora>> 1.2 L taken off  SIGNIFICANT EVENTS: 12/21  Admit with RLL PNA 12/22  Decompensated, required intubation for respiratory failure in setting of PNA 12/23  Stable on vent, increased pleural effusion on CXR.  Levo @ 2 mcg, Fent 162mcg, Precedex 0.6.  Seizures overnight, Tmax 105 12/24  Concern for ileus with TF's coming out of mouth overnight.  Reglan dosing 12/26  Reintubated due to inability to clear secretions 12/30 Cr rising, UOP dropping. 12/31 Cr up to 1.9, UOP 250 ml over 24 hours, renal consult called 1/1 transfused 1 U pRBC;  1/1 Transferred to [facility] , CRRT per renal  1/2 Transfused 1 U pRBC 1/5 Trach placed 1/6 Hypotension, on Neo, fevers 1/9 EGD, PEG tube placed 1/10 Taken off CRRT per renal 1/10 Left thoracentesis, 1.2 L drained. Fluis c/w transudate  SUBJECTIVE:  Overnight, patient fell out of bed. No injuries. Patient denies any pain this morning. He is tolerating trach collar well.   Patient states he wishes to go home. I explained that he needs to take his recovery slowly and will likely need to go to inpatient rehab.    VITAL SIGNS: Temp:  [98 F (36.7 C)-99.4 F (37.4 C)] 99.4 F (37.4 C) (01/12 0330) Pulse Rate:  [90-116] 102 (01/12 0600) Resp:  [9-39] 22 (01/12 0600) BP: (97-158)/(51-94) 131/84 mmHg  (01/12 0600) SpO2:  [91 %-100 %] 96 % (01/12 0600) FiO2 (%):  [28 %] 28 % (01/12 0400)   HEMODYNAMICS:     VENTILATOR SETTINGS: Vent Mode:  [-]  FiO2 (%):  [28 %] 28 %   INTAKE / OUTPUT:  Intake/Output Summary (Last 24 hours) at 02/06/14 0631 Last data filed at 02/06/14 0600  Gross per 24 hour  Intake   1880 ml  Output    599 ml  Net   1281 ml   PHYSICAL EXAMINATION: General: RASS 0, alert, awake HEENT: NCAT, EOMI, mucus membranes moist PULM: CTA bilaterally, breaths non-labored CV: tachycardic, no m/g/r Ab: BS+, soft, nontender Ext: no edema, warm Neuro: no focal deficits, following commands  LABS: I have reviewed all of today's lab results. Relevant abnormalities are discussed in the A/P section  CXR: NNF. Prior films reviewed  ASSESSMENT / PLAN:  PULMONARY OETT 12/22 >> 12/26, 12/26>> 01/05 Trach 1/5 >>> A: Prolonged resp failure due to pneumococcal CAP R parapneumonic pleural effusion  Smoker Trach status L pleural effusion - transudate by thora 1/10 P:   Tolerating trach collar PT/OT Meets criteria for inpatient rehab>> consulted  CARDIOVASCULAR Lines reviewed A:  Septic shock, resolved Sinus tachycardia  Possible relative adrenal insuff P:  MAP goal > 65 mmHg Continue sched metoprolol 25 mg BID PRN IV metoprolol to maintain HR < 115/min  RENAL A:   AKI, oliguric - likely ATN Cr much improved but UOP still not  adequate  P:   Monitor BMET intermittently Monitor I/Os Correct electrolytes as indicated Reconsult Renal if additional HD needed, likely not needed   GASTROINTESTINAL A:   Protein Calorie Malnutrition Dysphagia due to trach tube Mild duodenitis- likely not source for bleeding PEG 1/9 P:   SUP: famotidine Feed per PEG Will need colonoscopy once out of ICU or as outpatient for +FOBT/anemia  HEMATOLOGIC A:   Macrocytic Anemia ICU associated anemia without overt blood loss Thrombocytosis, reactive P:  DVT px:  SCDs Monitor CBC intermittently Transfuse per usual ICU guidelines  INFECTIOUS A:   Severe sepsis Pneumococcal PNA, treated Pneumococcal bacteremia, treated  Fever, leukocytosis without obvious source P:   - BCx2 12/21 >>strep pneumo sens - UC  12/21 >> ng - Sputum 12/21 >> moderate strep pneumoniae - Blood 12/30>>> ng - Urine 12/30>>> ng - Sputum 12/30>>> abundant WBCs, no organisms - Pleural fluid culture 12/29>>> WBCs, no organisms - BC 1/6>>> ng  - Abx: Rocephin, start date 12/21>>>12/30. - Abx: Azithro, 12/21>>12/25 - Vancomycin 12/30>>>12/31, 1/6>>1/8 - Zosyn 12/30>>>1/4 - Ceftazidime 1/6>> 01/09  Afebrile, leukocytosis persists Continue to monitor off abx   ENDOCRINE A:   Rel AI P:   Solu-cortef d/c'd 1/11  NEUROLOGIC A:   Alcohol abuse Alcohol withdrawal seizures 12/23 Anxiety / Depression   P:   - RASS goal: 0 Cont Fentanyl patch Cont Thiamine  Continue quetiapine 200 mg BID Continue lorazepam 1 mg IV Q2H PRN Continue Clonazepam 2 mg BID Continue Clonidine 0.1 mg patch Qweekly. Can take off if BP low.  PT/OT consulted>> meets CIR criteria, CIR consulted     Albin Felling, MD Internal Medicine Resident, PGY-1   PCCM ATTENDING: I have reviewed pt's initial presentation, consultants notes and hospital database in detail.  The above assessment and plan was formulated under my direction.   Merton Border, MD;  PCCM service; Mobile [phone]

## 2014-02-06 NOTE — Care Management Note (Addendum)
    Page 1 of 2   02/08/2014     3:43:58 PM CARE MANAGEMENT NOTE 02/08/2014  Patient:  Allen Wood, Allen Wood   Account Number:  [id number]  Date Initiated:  01/29/2014  Documentation initiated by:  Elissa Hefty  Subjective/Objective Assessment:   adm w resp failure, vent     Action/Plan:   lives w fam, pcp [facility]   Anticipated DC Date:  02/08/2014   Anticipated DC Plan:  IP REHAB FACILITY  In-house referral  Clinical Social Worker      DC Forensic scientist  CM consult      Choice offered to / List presented to:             Status of service:  Completed, signed off Medicare Important Message given?   (If response is "NO", the following Medicare IM given date fields will be blank) Date Medicare IM given:   Medicare IM given by:   Date Additional Medicare IM given:   Additional Medicare IM given by:    Discharge Disposition:  IP REHAB FACILITY  Per UR Regulation:  Reviewed for med. necessity/level of care/duration of stay  If discussed at [place] of Stay Meetings, dates discussed:   01/30/2014  02/01/2014  02/06/2014    Comments:  ContactJamai, Dolce Spouse   [phone]  02/08/14- Metz RN, BSN [phone] Spoke with Genie with CIR- they have a bed ready for pt today, per bedside RN MD states pt ready for CIR- and will notify MD that CIR has bed for today- plan is for pt to discharge to CIR later today.  02-06-14 11:40am Luz Lex RNBSN [phone] On trach collar - sitting in chair.  Looks good.  Wife and and wife's sister at bedside.  Wife is finishing her NT training.  States can learn whatever she needs to to care for him at home.  Would like inhouse rehab prior to coming home but would prefer not to go to SNF for rehab if possible - would rather due at her home.  CM will continue to follow.  02-05-14  11:40am [place] [phone] Remains intubated - agitated.  Off CRRT - feel kidneys improving - Renal signing  off. Plan for intermediate, vent bed.  SW updated. referral placed.  02-01-14 8:40am [place] [phone] Continue on vent with trach - pressors and CRRT - Continues with agitiation.  01-30-14 8:45am [place] [phone] Continues on vent, failed wean.  Plan for trach - on CRRT. Trached today.  Confirmed Medicaid insurance.  Ltach referrals placed.  No Ltach benefits.  CM will continue to follow.

## 2014-02-06 NOTE — Progress Notes (Signed)
Patient transferred to 3S10. Patient in no signs of distress and no c/o pain. Pt VSS. Pt placed on monitor. Bedside nurse present and all questions answered.

## 2014-02-06 NOTE — Progress Notes (Signed)
Pt admitted to 3S10 from 57m01. Oriented to unit and room. Safety discussed and call bell with in reach. VSS. Trach and safety equipment in room. Safety sitter present. Will continue to monitor  Erick Blinks, RN

## 2014-02-06 NOTE — Progress Notes (Signed)
OT Cancellation Note  Patient Details Name: Allen Wood MRN: [id number] DOB: 03-Jun-1970   Cancelled Treatment:    Reason Eval/Treat Not Completed: Fatigue/lethargy limiting ability to participate (Will continue to follow.)  Malka So 02/06/2014, 12:27 PM

## 2014-02-06 NOTE — Progress Notes (Signed)
RT tried to change dressing under patients trach. Patient became aggitated and is refusing to let RT change out dressing.

## 2014-02-07 MED ORDER — VITAL HIGH PROTEIN PO LIQD
1000.0000 mL | ORAL | Status: DC
  Administered 2014-02-07 – 2014-02-08 (×2): 1000 mL
  Filled 2014-02-07 (×3): qty 1000

## 2014-02-07 MED ORDER — PANTOPRAZOLE SODIUM 40 MG PO PACK
40.0000 mg | PACK | Freq: Every day | ORAL | Status: DC
  Administered 2014-02-07 – 2014-02-08 (×2): 40 mg
  Filled 2014-02-07 (×2): qty 20

## 2014-02-07 NOTE — Progress Notes (Signed)
NUTRITION FOLLOW UP  Intervention:    Recommend resume TF via PEG, change to Osmolite 1.2 at 75 ml/h to provide 2160 kcals, 100 gm protein, 1476 ml free water daily.  Nutrition Dx:   Inadequate oral intake related to inability to eat as evidenced by NPO; ongoing  Goal:   Intake to meet >90% of estimated nutrition needs; met.  Monitor:   Weight trend, labs, TF tolerance, ability to start PO diet  Assessment:   Patient admitted 12/21 with ongoing fatigue, cough with productive yellow sputum, R posterior back pain, chills & sweats. Work up found patient to have RLL PNA with positive strep antigen.   S/P PEG placement on 1/9. Pt is receiving Vital AF 1.2 at 75 ml/hr, meeting nutrition goals. TF currently off, unsure why. ? Plans to change/downsize trach. S/P tracheostomy on 1/5. Patient is currently on trach collar. CRRT stopped on 1/10. Renal team has signed off. UOP improving.  Height: Ht Readings from Last 1 Encounters:  02/06/14 _0  (1.803 m)    Weight Status:   Wt Readings from Last 1 Encounters:  02/03/14 165 lb 12.6 oz (75.2 kg)  01/20/14 167 lb (76 kg)  Re-estimated needs:  Kcal: 2000-2200 Protein: 100-110 gm  Fluid: 2 L  Skin: Intact  Diet Order: Diet NPO time specified   Intake/Output Summary (Last 24 hours) at 02/07/14 1052 Last data filed at 02/07/14 0730  Gross per 24 hour  Intake   1350 ml  Output   1100 ml  Net    250 ml    Last BM: 1/13 (flexiseal in place)   Labs:   Recent Labs Lab 02/01/14 0605 02/01/14 1600 02/02/14 0330 02/02/14 1500 02/03/14 0428 02/04/14 0433 02/06/14 0445  NA 137 136 136 135  --  137 138  K 3.9 4.3 4.4 4.9  --  4.6 3.6  CL 107 106 103 101  --  104 104  CO2 _1 --  27 27  BUN _2 <5*  --  9 36*  CREATININE 1.41* 1.29 1.31 1.43*  --  1.66* 1.16  CALCIUM 7.9* 7.8* 8.0* 8.2*  --  8.7 9.5  MG  --   --  2.2  --  2.3 2.3  --   PHOS 3.4 3.4  --  3.3  --   --   --   GLUCOSE 104* 124* 88 94  --  74 103*     CBG (last 3)   Recent Labs  02/05/14 0017 02/05/14 0300 02/05/14 0748  GLUCAP 138* 102* 99    Scheduled Meds: . antiseptic oral rinse  7 mL Mouth Rinse QID  . chlorhexidine  15 mL Mouth Rinse BID  . clonazePAM  2 mg Per Tube BID  . cloNIDine  0.1 mg Transdermal Weekly  . fentaNYL  75 mcg Transdermal Q72H  . ferrous sulfate  300 mg Per Tube BID WC  . folic acid  1 mg Per Tube Daily  . ipratropium-albuterol  3 mL Nebulization Q6H  . metoprolol tartrate  25 mg Per Tube BID  . multivitamin  5 mL Per Tube Daily  . neomycin-bacitracin-polymyxin  1 application Topical Daily  . nicotine  21 mg Transdermal Daily  . pantoprazole sodium  40 mg Per Tube Daily  . thiamine  100 mg Per Tube Daily    Continuous Infusions: . sodium chloride 10 mL/hr at 01/31/14 1319  . feeding supplement (VITAL HIGH PROTEIN) 1,000 mL (02/06/14 0351)  Molli Barrows, RD, LDN, [place] Pager [phone] After Hours Pager [phone]

## 2014-02-07 NOTE — Progress Notes (Signed)
Speech Language Pathology Treatment: Nada Boozer Speaking valve  Patient Details Name: Allen Wood MRN: [id number] DOB: Feb 03, 1970 Today's Date: 02/07/2014 Time: 2025-4270 SLP Time Calculation (min) (ACUTE ONLY): 20 min  Assessment / Plan / Recommendation Clinical Impression  PMSV trial with pt  tolerating cuff deflation. SpO2 ranged 90%-93%, RR23, HR 104. Expectorating secretions from trach with moderate verbal cues for deep inhalation prior to cough and verbalization. Phonating in sentences with increased rate and poor respiratory support mitigated with cues/strategies to facilitate respiration in sentences. No air trapping present. Cuff reinflated at end of session, however goal is for cuff to remain deflated following next session if tolerates and possible initiation of swallow evaluation (dependent on PMSV tolerance).   HPI HPI: 44 y/o M, smoker / ETOH abuse, admitted 12/21 with ongoing fatigue, cough with productive yellow sputum, R posterior back pain, chills & sweats. Work up found patient to have RLL PNA with positive strep antigen. Developed worsening respiratory failure 12/22 with intubation, trach 1/5, thoracotomy 1/10, off CRRT 1/9.  Currently with #8 cuffed Shiley; trach collar.    Pertinent Vitals Pain Assessment: No/denies pain  SLP Plan  Continue with current plan of care    Recommendations Diet recommendations:  (N/A)      Patient may use Passy-Muir Speech Valve: with SLP only PMSV Supervision: Full MD: Please consider changing trach tube to : Smaller size;Cuffless       Oral Care Recommendations: Oral care BID Follow up Recommendations: Inpatient Rehab Plan: Continue with current plan of care    GO     Houston Siren 02/07/2014, 3:02 PM   Orbie Pyo Colvin Caroli[employer] [phone]

## 2014-02-07 NOTE — Progress Notes (Signed)
PULMONARY / CRITICAL CARE MEDICINE   Name: Allen Wood MRN: [id number] DOB: 13-Nov-1970    ADMISSION DATE:  01/15/2014 CONSULTATION DATE:  01/16/14  REFERRING MD :  Dr. Grandville Silos   CHIEF COMPLAINT:  Acute Respiratory Failure   INITIAL PRESENTATION: 44 y/o M, smoker / ETOH abuse, admitted 12/21 with ongoing fatigue, cough with productive yellow sputum, R posterior back pain, chills & sweats.  Work up found patient to have RLL PNA with positive strep antigen.  Developed worsening respiratory failure 12/22  STUDIES:  12/24 EEG >> neg 1/1 Echo >EF 60%, no wall abn 1/7 RUE Doppler>> neg for DVT 1/9 EGD>> Mild duodenitis in the duodenal bulb 1/10 L Thora>> 1.2 L taken off  SIGNIFICANT EVENTS: 12/21  Admit with RLL PNA 12/22  Decompensated, required intubation for respiratory failure in setting of PNA 12/23  Stable on vent, increased pleural effusion on CXR.  Levo @ 2 mcg, Fent 189mcg, Precedex 0.6.  Seizures overnight, Tmax 105 12/24  Concern for ileus with TF's coming out of mouth overnight.  Reglan dosing 12/26  Reintubated due to inability to clear secretions 12/30 Cr rising, UOP dropping. 12/31 Cr up to 1.9, UOP 250 ml over 24 hours, renal consult called 1/1 transfused 1 U pRBC;  1/1 Transferred to [facility] , CRRT per renal  1/2 Transfused 1 U pRBC 1/5 Trach placed 1/6 Hypotension, on Neo, fevers 1/9 EGD, PEG tube placed 1/10 Taken off CRRT per renal 1/10 Left thoracentesis, 1.2 L drained. Fluis c/w transudate  SUBJECTIVE:  Overnight, patient fell out of bed. No injuries. Patient denies any pain this morning. He is tolerating trach collar well.   Patient states he wishes to go home. I explained that he needs to take his recovery slowly and will likely need to go to inpatient rehab.    VITAL SIGNS: Temp:  [93.2 F (34 C)-100 F (37.8 C)] 99 F (37.2 C) (01/13 1207) Pulse Rate:  [96-110] 103 (01/13 1447) Resp:  [14-29] 27 (01/13 1447) BP: (99-130)/(56-91) 130/81 mmHg  (01/13 0702) SpO2:  [91 %-98 %] 94 % (01/13 1447) FiO2 (%):  [28 %] 28 % (01/13 1447)   HEMODYNAMICS:     VENTILATOR SETTINGS: Vent Mode:  [-]  FiO2 (%):  [28 %] 28 %   INTAKE / OUTPUT:  Intake/Output Summary (Last 24 hours) at 02/07/14 1522 Last data filed at 02/07/14 0730  Gross per 24 hour  Intake    975 ml  Output    900 ml  Net     75 ml   PHYSICAL EXAMINATION: General: RASS 0, alert, awake HEENT: NCAT, EOMI, mucus membranes moist PULM: CTA bilaterally, breaths non-labored CV: tachycardic, no m/g/r Ab: BS+, soft, nontender Ext: no edema, warm Neuro: no focal deficits, following commands  LABS: I have reviewed all of today's lab results. Relevant abnormalities are discussed in the A/P section  CXR: NNF. Prior films reviewed  ASSESSMENT / PLAN:  PULMONARY OETT 12/22 >> 12/26, 12/26>> 01/05 Trach 1/5 >>> A: Prolonged resp failure due to pneumococcal CAP R parapneumonic pleural effusion  Smoker Trach status L pleural effusion - transudate by thora 1/10 P:   Tolerating trach collar  PT/OT Meets criteria for inpatient rehab>> consulted  CARDIOVASCULAR Lines reviewed A:  Septic shock, resolved Sinus tachycardia  Possible relative adrenal insuff P:  MAP goal > 65 mmHg Continue sched metoprolol 25 mg BID PRN IV metoprolol to maintain HR < 115/min  RENAL A:   AKI, oliguric - likely ATN Cr much improved  but UOP still not adequate  P:   Monitor BMET intermittently Monitor I/Os Correct electrolytes as indicated Reconsult Renal if additional HD needed, likely not needed   GASTROINTESTINAL A:   Protein Calorie Malnutrition Dysphagia due to trach tube Mild duodenitis- likely not source for bleeding PEG 1/9 P:   SUP: famotidine Feed per PEG Will need colonoscopy as outpatient for +FOBT/anemia  HEMATOLOGIC A:   Macrocytic Anemia ICU associated anemia without overt blood loss Thrombocytosis, reactive P:  DVT px: SCDs Monitor CBC  intermittently Transfuse per usual ICU guidelines  INFECTIOUS A:   Severe sepsis Pneumococcal PNA, treated Pneumococcal bacteremia, treated  Fever, leukocytosis without obvious source P:   - BCx2 12/21 >>strep pneumo sens - UC  12/21 >> ng - Sputum 12/21 >> moderate strep pneumoniae - Blood 12/30>>> ng - Urine 12/30>>> ng - Sputum 12/30>>> abundant WBCs, no organisms - Pleural fluid culture 12/29>>> WBCs, no organisms - BC 1/6>>> ng  - Abx: Rocephin, start date 12/21>>>12/30. - Abx: Azithro, 12/21>>12/25 - Vancomycin 12/30>>>12/31, 1/6>>1/8 - Zosyn 12/30>>>1/4 - Ceftazidime 1/6>> 01/09  Afebrile, leukocytosis persists Continue to monitor off abx   ENDOCRINE A:   Rel AI P:   Solu-cortef d/c'd 1/11  NEUROLOGIC A:   Alcohol abuse Alcohol withdrawal seizures 12/23 Anxiety / Depression   P:   - RASS goal: 0 Cont Fentanyl patch Cont Thiamine  Continue quetiapine 200 mg BID Continue lorazepam 1 mg IV Q2H PRN Continue Clonazepam 2 mg BID Continue Clonidine 0.1 mg patch Qweekly. Can take off if BP low.  PT/OT consulted>> meets CIR criteria, CIR consulted     Baltazar Apo, MD, PhD 02/07/2014, 3:27 PM [place] [facility] [phone] or if no answer [phone]

## 2014-02-07 NOTE — Evaluation (Signed)
Occupational Therapy Evaluation Patient Details Name: Allen Wood MRN: [id number] DOB: 10-Feb-1970 Today's Date: 02/07/2014    History of Present Illness 44 y/o M, smoker / ETOH abuse, admitted 12/21 with ongoing fatigue, cough with productive yellow sputum, R posterior back pain, chills & sweats.  Work up found patient to have RLL PNA with positive strep antigen.  Developed worsening respiratory failure 12/22 with intubation, trach 1/5, thoracotomy 1/10, off CRRT 1/9   Clinical Impression   PTA pt lived at home and was independent with ADLs. Pt currently limited by generalized weakness and fatigues quickly with activity, however is motivated to return to independence. Pt would be excellent CIR candidate and currently requires min/mod (A) for ADLs and functional mobility due to decreased endurance. Pt will benefit from acute OT to address functional mobility and ADLs. HR max 125, RR max 28-29 during functional activity. Pt on FiO2 28% during session with SPO2 stable.     Follow Up Recommendations  CIR;Supervision/Assistance - 24 hour    Equipment Recommendations  Other (comment) (TBD)    Recommendations for Other Services       Precautions / Restrictions Precautions Precautions: Fall Precaution Comments: trach, peg, flexiseal Restrictions Weight Bearing Restrictions: No      Mobility Bed Mobility Overal bed mobility: Needs Assistance Bed Mobility: Supine to Sit     Supine to sit: Min guard     General bed mobility comments: VC's and min guard for safety. Pt slightly impulsive.   Transfers Overall transfer level: Needs assistance Equipment used: Rolling walker (2 wheeled) Transfers: Sit to/from Stand Sit to Stand: Mod assist         General transfer comment: Initially min (A), however fatigues quickly and requires Mod (A). VC's for hand placement and safety.     Balance Overall balance assessment: Needs assistance Sitting-balance support: No upper extremity  supported;Feet supported Sitting balance-Leahy Scale: Fair     Standing balance support: Bilateral upper extremity supported;During functional activity Standing balance-Leahy Scale: Fair Standing balance comment: Pt able to take UE off RW for support briefly, however fatigues quickly and relies on UE support.                             ADL Overall ADL's : Needs assistance/impaired Eating/Feeding: NPO Eating/Feeding Details (indicate cue type and reason): PEG tube Grooming: Minimal assistance;Standing Grooming Details (indicate cue type and reason): after ambulating to the sink, pt required to sit. he was able to stand at sink after brief rest and wash his face. Pt leaned his forearms on the sink and required VC's to stand up straight.  Upper Body Bathing: Set up;Sitting   Lower Body Bathing: Minimal assistance;Sit to/from stand   Upper Body Dressing : Set up;Sitting   Lower Body Dressing: Minimal assistance;Sit to/from stand   Toilet Transfer: Minimal assistance;Ambulation;RW;Moderate assistance Toilet Transfer Details (indicate cue type and reason): sit<>stand x 3 (once from bed, twice from armchair). Initially Min (A) however fatigues quickly and requires Mod (A).          Functional mobility during ADLs: Minimal assistance;Rolling walker General ADL Comments: Pt presents with decreased endurance and fatigues quickly during functional tasks. Pt able to ambulate to sink to wash his face before returning to chair to sit up. HR max 125, RR 28-29.      Vision  Pt reports no change from baseline.  Perception Perception Perception Tested?: No   Praxis Praxis Praxis tested?: Within functional limits    Pertinent Vitals/Pain Pain Assessment: Faces Faces Pain Scale: Hurts little more Pain Location: L posterior back, sternum Pain Descriptors / Indicators: Grimacing Pain Intervention(s): Monitored during session;Repositioned     Hand  Dominance     Extremity/Trunk Assessment Upper Extremity Assessment Upper Extremity Assessment: Generalized weakness   Lower Extremity Assessment Lower Extremity Assessment: Generalized weakness   Cervical / Trunk Assessment Cervical / Trunk Assessment: Normal   Communication Communication Communication: Tracheostomy   Cognition Arousal/Alertness: Awake/alert Behavior During Therapy: Impulsive Overall Cognitive Status: Difficult to assess                                Home Living Family/patient expects to be discharged to:: Private residence Living Arrangements: Spouse/significant other Available Help at Discharge: Family;Available 24 hours/day Type of Home: House Home Access: Stairs to enter [employer] of Steps: 3 Entrance Stairs-Rails: Left;Right Home Layout: One level               Home Equipment: None   Additional Comments: information gathered from PT note (per pt's spouse)      Prior Functioning/Environment Level of Independence: Independent        Comments: per PT note (spouse report)    OT Diagnosis: Generalized weakness;Acute pain   OT Problem List: Decreased strength;Decreased activity tolerance;Impaired balance (sitting and/or standing);Decreased safety awareness   OT Treatment/Interventions: Self-care/ADL training;Therapeutic exercise;Energy conservation;DME and/or AE instruction;Therapeutic activities;Patient/family education;Balance training    OT Goals(Current goals can be found in the care plan section) Acute Rehab OT Goals Patient Stated Goal: rehab then home OT Goal Formulation: With patient Time For Goal Achievement: 02/21/14 Potential to Achieve Goals: Good ADL Goals Pt Will Perform Grooming: with supervision;standing (for 5 minutes for ADLs) Pt Will Perform Lower Body Bathing: with supervision;sit to/from stand Pt Will Perform Lower Body Dressing: with supervision;sit to/from stand Pt Will Transfer to Toilet:  with supervision;ambulating  OT Frequency: Min 2X/week    End of Session Equipment Utilized During Treatment: Gait belt;Rolling walker;Other (comment) (trach collar, rectal tube) Nurse Communication: Mobility status  Activity Tolerance: Patient tolerated treatment well Patient left: in chair;with call bell/phone within reach   Time: 1452-1526 OT Time Calculation (min): 34 min Charges:  OT General Charges $OT Visit: 1 Procedure OT Evaluation $Initial OT Evaluation Tier I: 1 Procedure OT Treatments $Self Care/Home Management : 23-37 mins G-Codes:    Juluis Rainier Mar 03, 2014, 6:23 PM  Secundino Ginger Lynetta Mare, OTR/L Occupational Therapist [phone] (pager)

## 2014-02-08 ENCOUNTER — Inpatient Hospital Stay (HOSPITAL_COMMUNITY)
Admission: RE | Admit: 2014-02-08 | Discharge: 2014-02-12 | DRG: 091 | Disposition: A | Discharge status: Other Acute Inpatient Hospital | Payer: Medicaid Other | Source: Intra-hospital | Attending: Physical Medicine & Rehabilitation | Admitting: Physical Medicine & Rehabilitation

## 2014-02-08 DIAGNOSIS — R1312 Dysphagia, oropharyngeal phase: Secondary | ICD-10-CM | POA: Diagnosis present

## 2014-02-08 DIAGNOSIS — Z930 Tracheostomy status: Secondary | ICD-10-CM | POA: Diagnosis not present

## 2014-02-08 DIAGNOSIS — E860 Dehydration: Secondary | ICD-10-CM | POA: Diagnosis not present

## 2014-02-08 DIAGNOSIS — J90 Pleural effusion, not elsewhere classified: Secondary | ICD-10-CM

## 2014-02-08 DIAGNOSIS — R000 Tachycardia, unspecified: Secondary | ICD-10-CM | POA: Diagnosis not present

## 2014-02-08 DIAGNOSIS — R5381 Other malaise: Secondary | ICD-10-CM

## 2014-02-08 DIAGNOSIS — F1020 Alcohol dependence, uncomplicated: Secondary | ICD-10-CM | POA: Diagnosis present

## 2014-02-08 DIAGNOSIS — R059 Cough, unspecified: Secondary | ICD-10-CM

## 2014-02-08 DIAGNOSIS — F10231 Alcohol dependence with withdrawal delirium: Secondary | ICD-10-CM

## 2014-02-08 DIAGNOSIS — F329 Major depressive disorder, single episode, unspecified: Secondary | ICD-10-CM

## 2014-02-08 DIAGNOSIS — Z8701 Personal history of pneumonia (recurrent): Secondary | ICD-10-CM | POA: Diagnosis not present

## 2014-02-08 DIAGNOSIS — N170 Acute kidney failure with tubular necrosis: Secondary | ICD-10-CM

## 2014-02-08 DIAGNOSIS — Z79899 Other long term (current) drug therapy: Secondary | ICD-10-CM | POA: Diagnosis not present

## 2014-02-08 DIAGNOSIS — D649 Anemia, unspecified: Secondary | ICD-10-CM

## 2014-02-08 DIAGNOSIS — G7281 Critical illness myopathy: Secondary | ICD-10-CM

## 2014-02-08 DIAGNOSIS — R05 Cough: Secondary | ICD-10-CM

## 2014-02-08 DIAGNOSIS — F17210 Nicotine dependence, cigarettes, uncomplicated: Secondary | ICD-10-CM | POA: Diagnosis not present

## 2014-02-08 DIAGNOSIS — J9691 Respiratory failure, unspecified with hypoxia: Secondary | ICD-10-CM | POA: Diagnosis not present

## 2014-02-08 DIAGNOSIS — R109 Unspecified abdominal pain: Secondary | ICD-10-CM

## 2014-02-08 DIAGNOSIS — Z931 Gastrostomy status: Secondary | ICD-10-CM

## 2014-02-08 DIAGNOSIS — F419 Anxiety disorder, unspecified: Secondary | ICD-10-CM

## 2014-02-08 DIAGNOSIS — Z9889 Other specified postprocedural states: Secondary | ICD-10-CM

## 2014-02-08 DIAGNOSIS — R509 Fever, unspecified: Secondary | ICD-10-CM | POA: Diagnosis not present

## 2014-02-08 DIAGNOSIS — J9601 Acute respiratory failure with hypoxia: Secondary | ICD-10-CM

## 2014-02-08 LAB — BODY FLUID CULTURE: CULTURE: NO GROWTH

## 2014-02-08 MED ORDER — ONDANSETRON HCL 4 MG/2ML IJ SOLN: 4.0000 mg | Freq: Four times a day (QID) | INTRAMUSCULAR | Status: DC | PRN

## 2014-02-08 MED ORDER — ACETAMINOPHEN 325 MG PO TABS
325.0000 mg | ORAL_TABLET | ORAL | Status: DC | PRN
  Administered 2014-02-08 – 2014-02-10 (×2): 650 mg via ORAL
  Administered 2014-02-11: 325 mg via ORAL
  Administered 2014-02-11 (×2): 650 mg via ORAL
  Filled 2014-02-08 (×3): qty 2
  Filled 2014-02-08: qty 1
  Filled 2014-02-08: qty 2

## 2014-02-08 MED ORDER — GUAIFENESIN-DM 100-10 MG/5ML PO SYRP: 5.0000 mL | ORAL_SOLUTION | Freq: Four times a day (QID) | ORAL | Status: DC | PRN

## 2014-02-08 MED ORDER — ONDANSETRON HCL 4 MG PO TABS: 4.0000 mg | ORAL_TABLET | Freq: Four times a day (QID) | ORAL | Status: DC | PRN

## 2014-02-08 MED ORDER — IPRATROPIUM-ALBUTEROL 0.5-2.5 (3) MG/3ML IN SOLN
3.0000 mL | Freq: Four times a day (QID) | RESPIRATORY_TRACT | Status: DC
  Administered 2014-02-08 – 2014-02-12 (×12): 3 mL via RESPIRATORY_TRACT
  Filled 2014-02-08 (×13): qty 3

## 2014-02-08 MED ORDER — CLONIDINE HCL 0.1 MG/24HR TD PTWK
0.1000 mg | MEDICATED_PATCH | TRANSDERMAL | Status: DC
  Administered 2014-02-10: 0.1 mg via TRANSDERMAL
  Filled 2014-02-08: qty 1

## 2014-02-08 MED ORDER — BACITRACIN-NEOMYCIN-POLYMYXIN OINTMENT TUBE
1.0000 | TOPICAL_OINTMENT | Freq: Every day | CUTANEOUS | Status: AC
Start: 2014-02-09 — End: 2014-02-12
  Administered 2014-02-09 – 2014-02-12 (×4): 1 via TOPICAL
  Filled 2014-02-08: qty 15

## 2014-02-08 MED ORDER — DOCUSATE SODIUM 50 MG/5ML PO LIQD
100.0000 mg | Freq: Two times a day (BID) | ORAL | Status: DC | PRN
  Filled 2014-02-08: qty 10

## 2014-02-08 MED ORDER — CLONAZEPAM 0.5 MG PO TABS
2.0000 mg | ORAL_TABLET | Freq: Two times a day (BID) | ORAL | Status: DC
  Administered 2014-02-08 – 2014-02-12 (×8): 2 mg
  Filled 2014-02-08 (×9): qty 4

## 2014-02-08 MED ORDER — FENTANYL 25 MCG/HR TD PT72
75.0000 ug | MEDICATED_PATCH | TRANSDERMAL | Status: DC
  Administered 2014-02-10: 75 ug via TRANSDERMAL
  Filled 2014-02-08: qty 3

## 2014-02-08 MED ORDER — FREE WATER
150.0000 mL | Freq: Three times a day (TID) | Status: DC
  Administered 2014-02-08 – 2014-02-09 (×3): 150 mL

## 2014-02-08 MED ORDER — CETYLPYRIDINIUM CHLORIDE 0.05 % MT LIQD
7.0000 mL | Freq: Four times a day (QID) | OROMUCOSAL | Status: DC
  Administered 2014-02-09 – 2014-02-12 (×12): 7 mL via OROMUCOSAL

## 2014-02-08 MED ORDER — TRAZODONE HCL 50 MG PO TABS
25.0000 mg | ORAL_TABLET | Freq: Every evening | ORAL | Status: DC | PRN
  Administered 2014-02-09: 50 mg via ORAL
  Filled 2014-02-08: qty 1

## 2014-02-08 MED ORDER — BISACODYL 10 MG RE SUPP: 10.0000 mg | Freq: Every day | RECTAL | Status: DC | PRN

## 2014-02-08 MED ORDER — OSMOLITE 1.2 CAL PO LIQD
1000.0000 mL | ORAL | Status: DC
  Administered 2014-02-08: 1000 mL
  Filled 2014-02-08 (×6): qty 1000

## 2014-02-08 MED ORDER — CHLORHEXIDINE GLUCONATE 0.12 % MT SOLN
15.0000 mL | Freq: Two times a day (BID) | OROMUCOSAL | Status: DC
  Administered 2014-02-08 – 2014-02-12 (×8): 15 mL via OROMUCOSAL
  Filled 2014-02-08 (×10): qty 15

## 2014-02-08 MED ORDER — OSMOLITE 1.2 CAL PO LIQD
1000.0000 mL | ORAL | Status: DC
  Filled 2014-02-08 (×4): qty 1000

## 2014-02-08 MED ORDER — VITAMIN B-1 100 MG PO TABS
100.0000 mg | ORAL_TABLET | Freq: Every day | ORAL | Status: DC
Start: 2014-02-09 — End: 2014-02-12
  Administered 2014-02-09 – 2014-02-12 (×4): 100 mg
  Filled 2014-02-08 (×5): qty 1

## 2014-02-08 MED ORDER — FOLIC ACID 1 MG PO TABS
1.0000 mg | ORAL_TABLET | Freq: Every day | ORAL | Status: DC
  Administered 2014-02-09 – 2014-02-12 (×4): 1 mg
  Filled 2014-02-08 (×5): qty 1

## 2014-02-08 MED ORDER — NICOTINE 21 MG/24HR TD PT24
21.0000 mg | MEDICATED_PATCH | Freq: Every day | TRANSDERMAL | Status: DC
  Administered 2014-02-09 – 2014-02-12 (×4): 21 mg via TRANSDERMAL
  Filled 2014-02-08 (×5): qty 1

## 2014-02-08 MED ORDER — METOPROLOL TARTRATE 25 MG/10 ML ORAL SUSPENSION
25.0000 mg | Freq: Two times a day (BID) | ORAL | Status: DC
  Administered 2014-02-08 – 2014-02-12 (×8): 25 mg
  Filled 2014-02-08 (×10): qty 10

## 2014-02-08 MED ORDER — LORAZEPAM 2 MG/ML IJ SOLN: 1.0000 mg | INTRAMUSCULAR | Status: DC | PRN

## 2014-02-08 MED ORDER — FLEET ENEMA 7-19 GM/118ML RE ENEM: 1.0000 | ENEMA | Freq: Once | RECTAL | Status: AC | PRN

## 2014-02-08 MED ORDER — DIPHENHYDRAMINE HCL 12.5 MG/5ML PO ELIX: 12.5000 mg | ORAL_SOLUTION | Freq: Four times a day (QID) | ORAL | Status: DC | PRN

## 2014-02-08 MED ORDER — PANTOPRAZOLE SODIUM 40 MG PO PACK
40.0000 mg | PACK | Freq: Every day | ORAL | Status: DC
  Administered 2014-02-09 – 2014-02-12 (×4): 40 mg
  Filled 2014-02-08 (×5): qty 20

## 2014-02-08 MED ORDER — ADULT MULTIVITAMIN LIQUID CH
5.0000 mL | Freq: Every day | ORAL | Status: DC
  Administered 2014-02-09 – 2014-02-12 (×4): 5 mL
  Filled 2014-02-08 (×5): qty 5

## 2014-02-08 MED ORDER — ALUM & MAG HYDROXIDE-SIMETH 200-200-20 MG/5ML PO SUSP: 30.0000 mL | ORAL | Status: DC | PRN

## 2014-02-08 MED ORDER — FERROUS SULFATE 300 (60 FE) MG/5ML PO SYRP
300.0000 mg | ORAL_SOLUTION | Freq: Two times a day (BID) | ORAL | Status: DC
  Administered 2014-02-09 – 2014-02-12 (×7): 300 mg
  Filled 2014-02-08 (×9): qty 5

## 2014-02-08 NOTE — Progress Notes (Signed)
Report given to Ed, RN on [place]. Pt's VSS, no c/o pain, due meds given. Personal belongings with pt, family at the bedside. Notified elink and CCMD of transfer it IP rehab, monitor removed. Pt transferred to 4w16.

## 2014-02-08 NOTE — H&P (Signed)
Physical Medicine and Rehabilitation Admission H&P   Chief Complaint  Patient presents with  . Critical illness myopathy.    HPI: Allen Wood is a 44 y.o. Male with h/o alcohol & tobacco abuse, LLL PNA August 2015 who was admitted 01/15/2014 with productive cough, SOB, right back pain and fever due to RLL PNA with positive strep antigen and pneumococcal bacteremia. He was treated with broad spectrum antibiotics but progressive respiratory failure requiring intubation 01/16/14 . Neurology services consulted 01/18/2014 due to recurrent seizures and Cranial CT scan was negative. He was loaded with Keppra and EEG unremarkable. Seizures have resolved and Dr. Doy Mince felt seizure were related to alcohol withdrawal and no antiepileptic needed. Treated with high dose thiamine due to concerns of Wernicke's encephalopathy. He had difficulty with vent wean and trach placed on 01/30/13 by CCM. GI consulted for input on heme+ stools with drop in Hgb to 6.4 as well as high gastric residual with inability to tolerate tube feeds. EGD revealed mild duodenitis and PEG placed on 01/06 by Dr. Fuller Plan. He was transfused with 2 units PRBC. Hospital course complicated by acute renal failure due to ATN with decrease in UOP requiring CVVHD to help with fluid overload. Has required pressors due to hypotensive shock. Echocardiogram completed with ejection fraction of 65% no wall motion abnormalities.  He tolerated extubation and is tolerating ATC as well as well as PMSV trials with ST. Plans for trach to be downsized next week as secretions improve. Blood pressures have been stable off pressors. He required left thoracocentesis of 1.2 L transudative fluid on 02/04/14. UOP has improved with improvement in renal status and nephrology has signed off. He has completed his antibiotic regimen with persistent leucocytosis noted. He is afebrile and CCM recommends monitoring off antibiotics. Therapy initiated and patient  noted to be significantly deconditioned with critical illness myopathy. CIR recommended by MD and rehab team and patient subsequently admitted today.   Patient mild words, understand simple questions  Review of Systems  HENT: Negative for hearing loss.  Eyes: Negative for blurred vision and double vision.  Respiratory: Positive for cough. Negative for shortness of breath and wheezing.  Cardiovascular: Negative for chest pain and orthopnea.  Gastrointestinal: Positive for abdominal pain. Negative for heartburn.  Neurological: Negative for headaches.  Endo/Heme/Allergies: Bruises/bleeds easily.  Psychiatric/Behavioral: The patient is nervous/anxious.   Past Medical History  Diagnosis Date  . Alcohol abuse   . Depression   . Anxiety   . Pneumonia    Past Surgical History  Procedure Laterality Date  . Mouth surgery      teeth removed.  . Tracheostomy  01/30/14    feinstein  . Esophagogastroduodenoscopy N/A 02/03/2014    Procedure: ESOPHAGOGASTRODUODENOSCOPY (EGD); Surgeon: Ladene Artist, MD; Location: [facility] ENDOSCOPY; Service: Endoscopy; Laterality: N/A; bedside/trach/vent  . Peg placement N/A 02/03/2014    Procedure: PERCUTANEOUS ENDOSCOPIC GASTROSTOMY (PEG) PLACEMENT; Surgeon: Ladene Artist, MD; Location: [facility] ENDOSCOPY; Service: Endoscopy; Laterality: N/A;   Family History  Problem Relation Age of Onset  . Cancer Mother     lung  . Cancer Father     kidney    Social History: Married and lives with family (has 3 and 46 year old at home). Independent and was working as an Armed forces operational officer. He reports that he has been smoking Cigarettes---had cut down to 1/2 PPD. Used to chew tobacco as a child. He has a 10 pack-year smoking history. He reports that he drinks alcohol. He reports that he does not use illicit  drugs.    Allergies: No Known Allergies    Medications Prior to Admission  Medication Sig  Dispense Refill  . buPROPion (WELLBUTRIN XL) 150 MG 24 hr tablet Take 150 mg by mouth every morning.    . sertraline (ZOLOFT) 50 MG tablet Take 50 mg by mouth at bedtime.    Marland Kitchen azithromycin (ZITHROMAX) 500 MG tablet Take 1 tablet (500 mg total) by mouth daily. (Patient not taking: Reported on 01/15/2014) 5 tablet 0  . predniSONE (DELTASONE) 10 MG tablet Takes 6 tablets for 1 days, then 5 tablets for 1 days, then 4 tablets for 1 days, then 3 tablets for 1 days, then 2 tabs for 1 days, then 1 tab for 1 days, and then stop. (Patient not taking: Reported on 01/15/2014) 21 tablet 0    Home: [place] expects to be discharged to:: Private residence Living Arrangements: Spouse/significant other Available Help at Discharge: Family, Available 24 hours/day Type of Home: House Home Access: Stairs to enter [employer] of Steps: 3 Entrance Stairs-Rails: Left, Right Home Layout: One level Home Equipment: None Additional Comments: information gathered from PT note (per pt's spouse)  Functional History: Prior Function Level of Independence: Independent Comments: per PT note (spouse report)  Functional Status:  Mobility: Bed Mobility Overal bed mobility: Needs Assistance Bed Mobility: Supine to Sit Supine to sit: Min guard Sit to supine: Max assist General bed mobility comments: VC's and min guard for safety. Pt slightly impulsive.  Transfers Overall transfer level: Needs assistance Equipment used: Rolling walker (2 wheeled) Transfers: Sit to/from Stand Sit to Stand: Mod assist General transfer comment: Initially min (A), however fatigues quickly and requires Mod (A). VC's for hand placement and safety.  Ambulation/Gait Ambulation/Gait assistance: Min assist, +2 safety/equipment Ambulation Distance (Feet): 15 Feet Assistive device: Rolling walker (2 wheeled) Gait Pattern/deviations: Narrow base of support, Trunk flexed, Shuffle Gait  velocity interpretation: Below normal speed for age/gender General Gait Details: max cues for posture, position in RW, increased BOS and increased stride. Chair to follow. Pt HR up to 138 with gait with activity terminated due to tachycardia with sats 94% on 35% FiO2 trach collar    ADL: ADL Overall ADL's : Needs assistance/impaired Eating/Feeding: NPO Eating/Feeding Details (indicate cue type and reason): PEG tube Grooming: Minimal assistance, Standing Grooming Details (indicate cue type and reason): after ambulating to the sink, pt required to sit. he was able to stand at sink after brief rest and wash his face. Pt leaned his forearms on the sink and required VC's to stand up straight.  Upper Body Bathing: Set up, Sitting Lower Body Bathing: Minimal assistance, Sit to/from stand Upper Body Dressing : Set up, Sitting Lower Body Dressing: Minimal assistance, Sit to/from stand Toilet Transfer: Minimal assistance, Ambulation, RW, Moderate assistance Toilet Transfer Details (indicate cue type and reason): sit<>stand x 3 (once from bed, twice from armchair). Initially Min (A) however fatigues quickly and requires Mod (A).  Functional mobility during ADLs: Minimal assistance, Rolling walker General ADL Comments: Pt presents with decreased endurance and fatigues quickly during functional tasks. Pt able to ambulate to sink to wash his face before returning to chair to sit up. HR max 125, RR 28-29.   Cognition: Cognition Overall Cognitive Status: Difficult to assess Orientation Level: Oriented X4 Cognition Arousal/Alertness: Awake/alert Behavior During Therapy: Impulsive Overall Cognitive Status: Difficult to assess Area of Impairment: Orientation Orientation Level: Disoriented to, Place, Time Difficult to assess due to: Tracheostomy  Physical Exam: Blood pressure 123/87, pulse 101, temperature  99.1 F (37.3 C), temperature source Oral, resp. rate 15, height 5\' 11"  (1.803 m), weight 75.2  kg (165 lb 12.6 oz), SpO2 97 %. Physical Exam  Nursing note and vitals reviewed. Constitutional: He appears well-developed. He has a sickly appearance.  Sitter in room. Signing for something to drink.  HENT:  Head: Normocephalic and atraumatic.  Dry oral mucosa with dried yellowish gray secretions on teeth.  Eyes: Conjunctivae are normal. Pupils are equal, round, and reactive to light.  Neck:  # 8 cuffed trach in place with frequent thick yellow secretions. Diateck cath left neck.  Cardiovascular: Regular rhythm. Tachycardia present.  HR 105-110 at rest  Respiratory: Effort normal. He has no wheezes. He has rhonchi.  Occasional productive coughing GI: Normal appearance and bowel sounds are normal. Distention: mild distension. There is tenderness.  PEG site tender but clean and dry.  Genitourinary:  Foley and rectal tube in place.  Musculoskeletal: He exhibits no edema or tenderness.  Neurological: He is alert.  Able to phonate occasionally. Moves all four.  Skin: Skin is warm and dry.  Motor strength is 4 minus bilateral deltoid,, bicep tricep, 5 minus grip, 3 minus hip flexors 4 minus knee extensors 4+ ankle dorsiflexors Sensation cannot assess secondary to mental status and communication deficits   Lab Results Last 48 Hours    No results found for this or any previous visit (from the past 48 hour(s)).    Imaging Results (Last 48 hours)    No results found.       Medical Problem List and Plan: 1. Functional deficits secondary to Critical Illness myopathy.  2. DVT Prophylaxis/Anticoagulation: Mechanical: Sequential compression devices, below knee Bilateral lower extremities 3. Pain Management: Continue fentanyl 75 mcg/hr and wean over the next few days.  4. Anxiety disorder/Mood: Was on Wellbutrin and Zoloft at home. Continue Klonopin bid for now. May need to resume zoloft. LCSW to follow for evaluation and support.  5. Neuropsych: This patient is capable of  making decisions on his own behalf. 6. Skin/Wound Care: Routine pressure relief measures. Maintain adequate nutritional and hydration status.  7. Fluids/Electrolytes/Nutrition: Continue tube feeds. Add water boluses for hydration.  8. Dysphagia: Continue TF for now. Question FEES soon as able to tolerate PMSV with good breath support.  9. VDRF: Continue nebs every 6 hours to help mobilize thick secretions. 10. Resting Tachycardia: Due to deconditioning--on metoprolol 25 mg bid to help maintain HR < 115 bpm  11. Anemia: Will need colonoscopy on outpatient basis. Monitor H/H with transfusion prn drop in Hgb <7.0.  12. Acute renal failure: Resolving with steady downward trend in Cr. Now with dehydration with rise BUN - 36. Addition of water flushes should help with this.    Post Admission Physician Evaluation: 1. Functional deficits secondary to Critical illness myopathy. 2. Patient is admitted to receive collaborative, interdisciplinary care between the physiatrist, rehab nursing staff, and therapy team. 3. Patient's level of medical complexity and substantial therapy needs in context of that medical necessity cannot be provided at a lesser intensity of care such as a SNF. 4. Patient has experienced substantial functional loss from his/her baseline which was documented above under the "Functional History" and "Functional Status" headings. Judging by the patient's diagnosis, physical exam, and functional history, the patient has potential for functional progress which will result in measurable gains while on inpatient rehab. These gains will be of substantial and practical use upon discharge in facilitating mobility and self-care at the household level. 5. Physiatrist will  provide 24 hour management of medical needs as well as oversight of the therapy plan/treatment and provide guidance as appropriate regarding the interaction of the two. 6. 24 hour rehab nursing will assist with bladder  management, bowel management, safety, skin/wound care, disease management, medication administration, pain management and patient education and help integrate therapy concepts, techniques,education, etc. 7. PT will assess and treat for/with: pre gait, gait training, endurance , safety, equipment, neuromuscular re education. Goals are: Supervision. 8. OT will assess and treat for/with: ADLs, Cognitive perceptual skills, Neuromuscular re education, safety, endurance, equipment. Goals are: Supervision. Therapy May not proceed with showering this patient. 9. SLP will assess and treat for/with: Swallowing, communication, cognition. Goals are: Safe by mouth intake, communicate basic needs verbally using Passy-Muir valve. 10. Case Management and Social Worker will assess and treat for psychological issues and discharge planning. 11. Team conference will be held weekly to assess progress toward goals and to determine barriers to discharge. 12. Patient will receive at least 3 hours of therapy per day at least 5 days per week. 13. ELOS: 20-25 days  14. Prognosis:good     Charlett Blake M.D. [facility] FAAPM&R (Sports Med, Neuromuscular Med) Diplomate [place] Board of Electrodiagnostic Med  02/08/2014

## 2014-02-08 NOTE — Progress Notes (Signed)
Rehab admissions - I met with patient and his wife this am.  They are interested in inpatient rehab admission.  Bed available and will admit to acute inpatient rehab today.  Call me for questions.  #[phone]

## 2014-02-08 NOTE — Progress Notes (Signed)
Speech Language Pathology Treatment: Nada Boozer Speaking valve Patient Details Name: Allen Wood MRN: [id number] DOB: January 01, 1971 Today's Date: 02/08/2014 Time: 1345-1400 SLP Time Calculation (min) (ACUTE ONLY): 15  Assessment / Plan / Recommendation Clinical Impression  Pt with significant improvements today with PMV toleration, despite large size of trach (#8).  Cuff partially inflated upon entering room; deflated by clinician.  PMV placed for 45 minutes with Sp02 remaining mid-90% range, HR 100, RR stable.  Phonation clear and strong; pt required min cues to increase volume and measure pacing for most efficient speech.  Pt performed his own oral care with set-up.  Recommend using valve with full supervision from staff.        HPI HPI: 44 y/o M, smoker / ETOH abuse, admitted 12/21 with ongoing fatigue, cough with productive yellow sputum, R posterior back pain, chills & sweats. Work up found patient to have RLL PNA with positive strep antigen. Developed worsening respiratory failure 12/22 with intubation, trach 1/5, thoracotomy 1/10, off CRRT 1/9.  Currently with #8 cuffed Shiley; trach collar.    Pertinent Vitals Pain Assessment: No/denies pain  SLP Plan  Discharge SLP treatment due to (comment) (Pt being D/Cd to CIR)    Recommendations Diet recommendations: NPO (except occasional ice chips after oral care)      Patient may use Passy-Muir Speech Valve: Intermittently with supervision PMSV Supervision: Full MD: Please consider changing trach tube to : Smaller size;Cuffless       Oral Care Recommendations:  (QID) Follow up Recommendations: Inpatient Rehab Plan: Discharge SLP treatment due to (comment) (Pt being D/Cd to CIR)    Allen Wood L. Tivis Ringer, [place] CCC/SLP Pager [phone]      Allen Wood 02/08/2014, 4:21 PM

## 2014-02-08 NOTE — Progress Notes (Signed)
PULMONARY / CRITICAL CARE MEDICINE   Name: Allen Wood MRN: [id number] DOB: 27-Nov-1970    ADMISSION DATE:  01/15/2014 CONSULTATION DATE:  01/16/14  REFERRING MD :  Dr. Grandville Silos   CHIEF COMPLAINT:  Acute Respiratory Failure   INITIAL PRESENTATION: 44 y/o M, smoker / ETOH abuse, admitted 12/21 with ongoing fatigue, cough with productive yellow sputum, R posterior back pain, chills & sweats.  Work up found patient to have RLL PNA with positive strep antigen.  Developed worsening respiratory failure 12/22  STUDIES:  12/24 EEG >> neg 1/1 Echo >EF 60%, no wall abn 1/7 RUE Doppler>> neg for DVT 1/9 EGD>> Mild duodenitis in the duodenal bulb 1/10 L Thora>> 1.2 L taken off  SIGNIFICANT EVENTS: 12/21  Admit with RLL PNA 12/22  Decompensated, required intubation for respiratory failure in setting of PNA 12/23  Stable on vent, increased pleural effusion on CXR.  Levo @ 2 mcg, Fent 121mcg, Precedex 0.6.  Seizures overnight, Tmax 105 12/24  Concern for ileus with TF's coming out of mouth overnight.  Reglan dosing 12/26  Reintubated due to inability to clear secretions 12/30 Cr rising, UOP dropping. 12/31 Cr up to 1.9, UOP 250 ml over 24 hours, renal consult called 1/1 transfused 1 U pRBC;  1/1 Transferred to [facility] , CRRT per renal  1/2 Transfused 1 U pRBC 1/5 Trach placed 1/6 Hypotension, on Neo, fevers 1/9 EGD, PEG tube placed 1/10 Taken off CRRT per renal 1/10 Left thoracentesis, 1.2 L drained. Fluis c/w transudate  SUBJECTIVE:  Up to chair yesterday, tolerated PMV yesterday and today.  He is very thirsty, is supposed to have swallow eval today.  Good candidate for CIR  VITAL SIGNS: Temp:  [98.1 F (36.7 C)-99.9 F (37.7 C)] 98.1 F (36.7 C) (01/14 0727) Pulse Rate:  [93-108] 105 (01/14 0900) Resp:  [13-29] 16 (01/14 0900) BP: (97-128)/(52-85) 128/73 mmHg (01/14 0727) SpO2:  [90 %-98 %] 97 % (01/14 0900) FiO2 (%):  [28 %] 28 % (01/14 0900)   HEMODYNAMICS:     VENTILATOR  SETTINGS: Vent Mode:  [-]  FiO2 (%):  [28 %] 28 %   INTAKE / OUTPUT:  Intake/Output Summary (Last 24 hours) at 02/08/14 1108 Last data filed at 02/08/14 1019  Gross per 24 hour  Intake   1140 ml  Output   1150 ml  Net    -10 ml   PHYSICAL EXAMINATION: General: RASS 0, alert, awake HEENT: NCAT, EOMI, mucus membranes moist PULM: CTA bilaterally, breaths non-labored CV: tachycardic, no m/g/r Ab: BS+, soft, nontender Ext: no edema, warm Neuro: no focal deficits, following commands  LABS: I have reviewed all of today's lab results. Relevant abnormalities are discussed in the A/P section  CXR: NNF. Prior films reviewed  ASSESSMENT / PLAN:  PULMONARY OETT 12/22 >> 12/26, 12/26>> 01/05 Trach 1/5 >>>  A: Prolonged resp failure due to pneumococcal CAP R parapneumonic pleural effusion  Smoker Trach status L pleural effusion - transudate by thora 1/10 P:   Tolerating trach collar  PT/OT Meets criteria for inpatient rehab Will try to downsize trach next week, should help with speech and swallowing  CARDIOVASCULAR Lines reviewed A:  Septic shock, resolved Sinus tachycardia  Possible relative adrenal insuff P:  MAP goal > 65 mmHg Continue sched metoprolol 25 mg BID PRN IV metoprolol to maintain HR < 115/min  RENAL A:   AKI, oliguric - likely ATN Cr much improved but UOP still not adequate  P:   Monitor BMET intermittently Monitor I/Os  Correct electrolytes as indicated Reconsult Renal if additional HD needed, likely not needed   GASTROINTESTINAL A:   Protein Calorie Malnutrition Dysphagia due to trach tube Mild duodenitis- likely not source for bleeding PEG 1/9 P:   SUP: famotidine Feed per PEG Will need colonoscopy as outpatient for +FOBT/anemia  HEMATOLOGIC A:   Macrocytic Anemia ICU associated anemia without overt blood loss Thrombocytosis, reactive P:  DVT px: SCDs Monitor CBC intermittently Transfuse per usual ICU guidelines  INFECTIOUS A:    Severe sepsis Pneumococcal PNA, treated Pneumococcal bacteremia, treated  Fever, leukocytosis without obvious source P:   - BCx2 12/21 >>strep pneumo sens - UC  12/21 >> ng - Sputum 12/21 >> moderate strep pneumoniae - Blood 12/30>>> ng - Urine 12/30>>> ng - Sputum 12/30>>> abundant WBCs, no organisms - Pleural fluid culture 12/29>>> WBCs, no organisms - BC 1/6>>> ng  - Abx: Rocephin, start date 12/21>>>12/30. - Abx: Azithro, 12/21>>12/25 - Vancomycin 12/30>>>12/31, 1/6>>1/8 - Zosyn 12/30>>>1/4 - Ceftazidime 1/6>> 01/09  Afebrile, leukocytosis persists Continue to monitor off abx   ENDOCRINE A:   Rel AI P:   Solu-cortef d/c'd 1/11  NEUROLOGIC A:   Alcohol abuse Alcohol withdrawal seizures 12/23 Anxiety / Depression   P:   - RASS goal: 0 Cont Fentanyl patch Cont Thiamine  Continue quetiapine 200 mg BID Continue lorazepam 1 mg IV Q2H PRN Continue Clonazepam 2 mg BID Continue Clonidine 0.1 mg patch Qweekly. Can take off if BP low.  PT/OT consulted>> meets CIR criteria    Suspect he will be able to go to CIR either 1/15 or next week  Baltazar Apo, MD, PhD 02/08/2014, 11:08 AM [facility] [phone] or if no answer [phone]

## 2014-02-08 NOTE — Trach Care Team (Signed)
[facility] Progression Note   Patient Details Name: Allen Wood MRN: [id number] DOB: 11/02/1970 Today's Date: 02/08/2014   Tracheostomy Assessment    Tracheostomy Shiley 8 mm Cuffed (Active)  Status Passy Muir Speaking valve 02/08/2014  2:51 PM  Site Assessment Dry;Crusty 02/08/2014 12:46 PM  Site Care Dried 02/07/2014  3:42 PM  Inner Cannula Care Changed/new 02/08/2014  2:51 PM  Ties Assessment Secure 02/08/2014  2:51 PM  Cuff pressure (cm) 24 cm 02/06/2014  8:32 PM  Emergency Equipment at bedside Yes 02/08/2014  2:51 PM     Care Needs Suture removal post-op day #7 : 02/06/14 (Sutures are due to be removed - Lilia Pro bedside RT made aware)   Respiratory Therapy Tracheostomy: Chronic trach O2 Device: Tracheostomy Collar FiO2 (%): 28 % SpO2: 94 % Education:  (not needed at this time) Follow up recommendations:  (plan is for pt to be transferred to inpt rehab today ) Respiratory barriers to progression:  (rehab today)    Speech Language Pathology  SLP chart review complete: Patient currently receiving at SLP services Patient may use Passy-Muir Speech Valve: Intermittently with supervision PMSV Supervision: Full MD: Please consider changing trach tube to : Smaller size, Cuffless Follow up Recommendations: Inpatient Rehab   Physical Therapy Ambulation/Gait assistance: Min assist, +2 safety/equipment PT Recommendation/Assessment: Patient needs continued PT services Follow Up Recommendations: CIR PT equipment: Rolling walker with 5" wheels    Occupational Therapy OT Recommendation/Assessment: Patient needs continued OT Services Follow Up Recommendations: CIR, Supervision/Assistance - 24 hour OT Equipment: Other (comment) (TBD)    Nutritional Patient's Current Diet: NPO, Tube feeding Tube Feeding: Vital High Protein Tube Feeding Frequency: Continuous Tube Feeding Strength: Full strength Diet Recommendations: Ice chips PRN after oral care (with PMV in place)    Case  Management/Social Work Level of patient care prior to hospitalization: Home-Self care Living status: Family (document relation) Insurance payer: Medicaid Barriers to progression:  Anticipated discharge disposition: CIR    Provider [place] Team/Provider Recommendations [place] Team Members Present-  Ciro Backer, RT & Molli Barrows, RD   None. For discharge to inpatient rehab.          Bradlee Heitman, Jaci Carrel ((scribe for team) 02/08/2014, 5:13 PM

## 2014-02-08 NOTE — PMR Pre-admission (Signed)
PMR Admission Coordinator Pre-Admission Assessment  Patient: Allen Wood is an 44 y.o., male MRN: [id number] DOB: 1970-09-25 Height: 5\' 11"  (180.3 cm) Weight: 75.2 kg (165 lb 12.6 oz)              Insurance Information HMO:      PPO:       PCP:       IPA:       80/20:       OTHER:   PRIMARY: Medicaid [name]      Policy#: [id number]      Subscriber: Allen Wood CM Name:        Phone#:       Fax#:   Pre-Cert#:        Employer: [employer] Benefits:  Phone #: [phone]     Name: Automated Eff. Date: Eligible 02/08/14     Deduct:        Out of Pocket Max:        Life Max:   CIR:        SNF:   Outpatient:       Co-Pay:   Home Health:        Co-Pay:   DME:       Co-Pay:   Providers:    Medicaid Application Date:        Case Manager:   Disability Application Date:        Case Worker:    Emergency Contact Information Contact Information    Name Relation Home Work Mobile   [place] Spouse   [phone]     Current Medical History  Patient Admitting Diagnosis:  Critical illness myopathy, sepsis, PNA  History of Present Illness: A 44 y.o. right handed male with history of alcohol and tobacco abuse and recently treated in August 2015 for left lower lobe pneumonia. By report patient lives with his wife and was independent prior to admission. Admitted 01/15/2014 with productive cough and shortness of breath as well as low-grade fever with chills. Chest x-ray with consolidation of much of the right lower lobe along with suspected right pleural effusion. White blood cell count 10,500. Alcohol level negative. Patient with positive strep antigen. Maintained on broad-spectrum antibiotics. Patient with progressive respiratory failure requiring intubation. Neurology services consulted 01/18/2014 with reported seizure. Cranial CT scan negative. He was loaded with Keppra. EEG was unremarkable and Keppra discontinued. Question seizure related to alcohol withdrawal. Underwent  thoracentesis of left-sided pleural effusion with 1.2 L of serous fluid removed 02/04/2014. Renal service is consulted 01/25/2014 for progressive increasing creatinine of 1.47-1.91. Renal ultrasound was unremarkable. Echocardiogram completed with ejection fraction of 65% no wall motion abnormalities. Patient with prolonged ventilatory support necessitating tracheostomy tube placement 01/30/2013 per Dr. Titus Mould. Patient with poor nutritional support as well as decrease in hemoglobin 6.7 positive Hemoccult stools. Gastroenterology services consulted Dr. Fuller Plan underwent EGD 02/03/2014 with findings of mild duodenitis no clear source for heme positive stools patient also with PEG tube placement at that time due to decreased nutritional support as well as questionable dysphagia. Patient currently remains nothing by mouth. Hemoglobin has stabilized at 7.8 after recent transfusion. Renal function has slowly improved placed creatinine 1.16. Reported fall, found on the floor of bedside 02/05/2014 without reported injury and a sitter was provided for safety. Physical and occupational therapy evaluations completed 02/05/2014 with recommendations of physical medicine rehabilitation consult. Patient to be admitted for comprehensive inpatient rehabilitation program.    Past Medical History  Past Medical History  Diagnosis Date  .  Alcohol abuse   . Depression   . Anxiety   . Pneumonia     Family History  family history includes Cancer in his father and mother.  Prior Rehab/Hospitalizations:  Has been sick since 08/15 with PNA.  No previous rehab admissions.   Current Medications   Current facility-administered medications:  .  0.9 %  sodium chloride infusion, , Intravenous, Continuous, Juanito Doom, MD, Last Rate: 10 mL/hr at 01/31/14 1319 .  acetaminophen (TYLENOL) solution 650 mg, 650 mg, Per Tube, Q6H PRN, Kara Mead V, MD, 650 mg at 01/30/14 1644 .  antiseptic oral rinse (CPC / CETYLPYRIDINIUM  CHLORIDE 0.05%) solution 7 mL, 7 mL, Mouth Rinse, QID, Brandi L Ollis, NP, 7 mL at 02/08/14 1120 .  bisacodyl (DULCOLAX) suppository 10 mg, 10 mg, Rectal, Daily PRN, Juanito Doom, MD, 10 mg at 01/18/14 1040 .  chlorhexidine (PERIDEX) 0.12 % solution 15 mL, 15 mL, Mouth Rinse, BID, Donita Brooks, NP, 15 mL at 02/08/14 0803 .  clonazePAM (KLONOPIN) tablet 2 mg, 2 mg, Per Tube, BID, Wilhelmina Mcardle, MD, 2 mg at 02/08/14 1119 .  cloNIDine (CATAPRES - Dosed in mg/24 hr) patch 0.1 mg, 0.1 mg, Transdermal, Weekly, Wilhelmina Mcardle, MD, 0.1 mg at 02/03/14 1000 .  docusate (COLACE) 50 MG/5ML liquid 100 mg, 100 mg, Oral, BID PRN, 100 mg at 01/22/14 0954 **OR** docusate (COLACE) 50 MG/5ML liquid 100 mg, 100 mg, Per Tube, BID PRN, Kara Mead V, MD, 100 mg at 01/24/14 1428 .  feeding supplement (OSMOLITE 1.2 CAL) liquid 1,000 mL, 1,000 mL, Per Tube, Continuous, Dalene Carrow, RD .  fentaNYL ([place] - dosed mcg/hr) 75 mcg, 75 mcg, Transdermal, Q72H, Wilber Oliphant, MD, 75 mcg at 02/07/14 0953 .  ferrous sulfate 300 (60 FE) MG/5ML syrup 300 mg, 300 mg, Per Tube, BID WC, Juanito Doom, MD, 300 mg at 02/08/14 1118 .  folic acid (FOLVITE) tablet 1 mg, 1 mg, Per Tube, Daily, Wilhelmina Mcardle, MD, 1 mg at 02/08/14 1118 .  hydrALAZINE (APRESOLINE) injection 10-40 mg, 10-40 mg, Intravenous, Q4H PRN, Wilhelmina Mcardle, MD .  ipratropium-albuterol (DUONEB) 0.5-2.5 (3) MG/3ML nebulizer solution 3 mL, 3 mL, Nebulization, Q6H, Wilber Oliphant, MD, 3 mL at 02/08/14 0908 .  LORazepam (ATIVAN) injection 1 mg, 1 mg, Intravenous, Q2H PRN, Wilhelmina Mcardle, MD .  metoprolol (LOPRESSOR) injection 2.5-5 mg, 2.5-5 mg, Intravenous, Q3H PRN, Wilhelmina Mcardle, MD .  metoprolol tartrate (LOPRESSOR) 25 mg/10 mL oral suspension 25 mg, 25 mg, Per Tube, BID, Wilhelmina Mcardle, MD, 25 mg at 02/08/14 1118 .  multivitamin liquid 5 mL, 5 mL, Per Tube, Daily, Irine Seal V, MD, 5 mL at 02/08/14 1118 .   neomycin-bacitracin-polymyxin (NEOSPORIN) ointment 1 application, 1 application, Topical, Daily, Vena Rua, PA-C, 1 application at 76/19/50 1117 .  nicotine (NICODERM CQ - dosed in mg/24 hours) patch 21 mg, 21 mg, Transdermal, Daily, Jacques Earthly, MD, 21 mg at 02/08/14 1118 .  pantoprazole sodium (PROTONIX) 40 mg/20 mL oral suspension 40 mg, 40 mg, Per Tube, Daily, Juanito Doom, MD, 40 mg at 02/08/14 1119 .  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   Home Assistive Devices / Equipment Home Assistive Devices/Equipment: None Home Equipment: None  Prior Functional Level Prior Function Level of Independence: Independent Comments: per PT note (spouse report)  Current Functional Level Cognition  Overall Cognitive Status: Difficult to assess Difficult to assess due to: Tracheostomy Orientation Level: Oriented X4    Extremity Assessment (includes Sensation/Coordination)  Upper Extremity Assessment: Generalized weakness Lower Extremity Assessment: Generalized weakness   ADLs  Overall ADL's : Needs assistance/impaired Eating/Feeding: NPO Eating/Feeding Details (indicate cue type and reason): PEG tube Grooming: Minimal assistance, Standing Grooming Details (indicate cue type and reason): after ambulating to the sink, pt required to sit. he was able to stand at sink after brief rest and wash his Wood. Pt leaned his forearms on the sink and required VC's to stand up straight.  Upper Body Bathing: Set up,  Sitting Lower Body Bathing: Minimal assistance, Sit to/from stand Upper Body Dressing : Set up, Sitting Lower Body Dressing: Minimal assistance, Sit to/from stand Toilet Transfer: Minimal assistance, Ambulation, RW, Moderate assistance Toilet Transfer Details (indicate cue type and reason): sit<>stand x 3 (once from bed, twice from armchair). Initially Min (A) however fatigues quickly and requires Mod (A).  Functional mobility during ADLs: Minimal assistance, Rolling walker General ADL Comments: Pt presents with decreased endurance and fatigues quickly during functional tasks. Pt able to ambulate to sink to wash his Wood before returning to chair to sit up. HR max 125, RR 28-29.     Mobility  Overal bed mobility: Needs Assistance Bed Mobility: Supine to Sit Supine to sit: Min guard Sit to supine: Max assist General bed mobility comments: VC's and min guard for safety. Pt slightly impulsive.     Transfers  Overall transfer level: Needs assistance Equipment used: Rolling walker (2 wheeled) Transfers: Sit to/from Stand Sit to Stand: Mod assist General transfer comment: Initially min (A), however fatigues quickly and requires Mod (A). VC's for hand placement and safety.     Ambulation / Gait / Stairs / Wheelchair Mobility  Ambulation/Gait Ambulation/Gait assistance: Min assist, +2 safety/equipment Ambulation Distance (Feet): 15 Feet Assistive device: Rolling walker (2 wheeled) Gait Pattern/deviations: Narrow base of support, Trunk flexed, Shuffle Gait velocity interpretation: Below normal speed for age/gender General Gait Details: max cues for posture, position in RW, increased BOS and increased stride. Chair to follow. Pt HR up to 138 with gait with activity terminated due to tachycardia with sats 94% on 35% FiO2 trach collar    Posture / Balance Dynamic Sitting Balance Sitting balance - Comments: Pt initially able to maintain trunk control EOB but within 4 sec excessive flexion with  max assist to control. 2 min EOB then pt had to return to supine due to lethargy    Special needs/care consideration BiPAP/CPAP Has sleep apnea, but not on CPAP or BIPAP at home CPM No Continuous Drip IV KVO Dialysis No       Life Vest No Oxygen Currently on 28% trach collar/5 L O2 Special Bed No Trach Size Yes, #8 with PMV when with speech therapy Wound Vac (area) No     Skin No.  Does have a calcium nodule on his right calf area.                               Bowel mgmt: Currently with rectal tube and loose stools Bladder  mgmt: Foley catheter in place Diabetic mgmt No    Previous Home Environment Living Arrangements: Spouse/significant other Available Help at Discharge: Family, Available 24 hours/day Type of Home: House Home Layout: One level Home Access: Stairs to enter Entrance Stairs-Rails: Left, Right Entrance Stairs-Number of Steps: 3 Home Care Services: No Additional Comments: information gathered from PT note (per pt's spouse)  Discharge Living Setting Plans for Discharge Living Setting: Patient's home, House, Lives with (comment) (Lives with wife, 73 yo son with Yehuda [place], 8yo son.) Type of Home at Discharge: House Discharge Home Layout: One level Discharge Home Access: Stairs to enter Entrance Stairs-Number of Steps: 4-5 steps at front entry Does the patient have any problems obtaining your medications?: No  Social/Family/Support Systems Patient Roles: Spouse, Parent (Has a wife, 71yo son, 73 yo son with Cherlyn Cushing syndrome,18 yo Dtr) Contact Information: Gionni Vaca - wife Anticipated Caregiver: Kennyth Lose - wife Anticipated Caregiver's Contact Information: Kennyth Lose [phone] Ability/Limitations of Caregiver: Wife currently not working and is finishing up her CNA class Caregiver Availability: 24/7 Discharge Plan Discussed with Primary Caregiver: Yes Is Caregiver In Agreement with Plan?: Yes Does Caregiver/Family have Issues with Lodging/Transportation while Pt is  in Rehab?: No  Goals/Additional Needs Patient/Family Goal for Rehab: PT/OT Supervision, ST Supervision/min assist goals Expected length of stay: 17-20 days Cultural Considerations: [employer] Dietary Needs: NPO with PEG tube for feedings Equipment Needs: TBD Additional Information: Patient is very thirsty and verbalized this frequently. Pt/Family Agrees to Admission and willing to participate: Yes Program Orientation Provided & Reviewed with Pt/Caregiver Including Roles  & Responsibilities: Yes  Decrease burden of Care through IP rehab admission: N/A  Possible need for SNF placement upon discharge: Not planned  Patient Condition: This patient's medical and functional status has changed since the consult dated: 02/06/14 in which the Rehabilitation Physician determined and documented that the patient's condition is appropriate for intensive rehabilitative care in an inpatient rehabilitation facility. See "History of Present Illness" (above) for medical update. Functional changes are: Currently requiring mod assist for transfers and ambulated 15 ft RW. Patient's medical and functional status update has been discussed with the Rehabilitation physician and patient remains appropriate for inpatient rehabilitation. Will admit to inpatient rehab today.  Preadmission Screen Completed By:  Retta Diones, 02/08/2014 12:53 PM ______________________________________________________________________   Discussed status with Dr. Letta Pate on 02/08/14 at 1254 and received telephone approval for admission today.  Admission Coordinator:  Retta Diones, time1254/Date01/14/16

## 2014-02-08 NOTE — Discharge Summary (Signed)
Physician Discharge Summary  Patient ID: Allen Wood MRN: [id number] DOB/AGE: [birth year] 46 y.o.  Admit date: 01/15/2014 Discharge date: 02/08/2014  Problem List Principal Problem:   Acute respiratory failure Active Problems:   CAP (community acquired pneumonia)   Nicotine abuse   EtOH dependence   Fatigue   Sepsis   Dehydration   Anemia   Tachycardia   Solitary pulmonary nodule   Pleural effusion   Bacteremia   Acute respiratory failure with hypoxia   Seizure   Alcohol withdrawal delirium   Encephalopathy acute   Acute respiratory distress   Abdominal distention   Anemia due to chronic blood loss   Occult blood in stools   Feeding difficulties   Dysphagia, oropharyngeal phase  HPI: 44 y/o M, smoker / ETOH abuse, admitted 12/21 with ongoing fatigue, cough with productive yellow sputum, R posterior back pain, chills & sweats. Work up found patient to have RLL PNA with positive strep antigen. Developed worsening respiratory failure 12/22 Hospital Course: STUDIES:  12/24 EEG >> neg 1/1 Echo >EF 60%, no wall abn 1/7 RUE Doppler>> neg for DVT 1/9 EGD>> Mild duodenitis in the duodenal bulb 1/10 L Thora>> 1.2 L taken off  SIGNIFICANT EVENTS: 12/21 Admit with RLL PNA 12/22 Decompensated, required intubation for respiratory failure in setting of PNA 12/23 Stable on vent, increased pleural effusion on CXR. Levo @ 2 mcg, Fent 134mcg, Precedex 0.6. Seizures overnight, Tmax 105 12/24 Concern for ileus with TF's coming out of mouth overnight. Reglan dosing 12/26 Reintubated due to inability to clear secretions 12/30 Cr rising, UOP dropping. 12/31 Cr up to 1.9, UOP 250 ml over 24 hours, renal consult called 1/1 transfused 1 U pRBC;  1/1 Transferred to [facility] , CRRT per renal  1/2 Transfused 1 U pRBC 1/5 Trach placed 1/6 Hypotension, on Neo, fevers 1/9 EGD, PEG tube placed 1/10 Taken off CRRT per renal 1/10 Left thoracentesis, 1.2 L drained. Fluis c/w  transudate   ASSESSMENT / PLAN:  PULMONARY OETT 12/22 >> 12/26, 12/26>> 01/05 Trach 1/5 >>>  A: Prolonged resp failure due to pneumococcal CAP R parapneumonic pleural effusion  Smoker Trach status L pleural effusion - transudate by thora 1/10 P:  Tolerating trach collar  PT/OT Meets criteria for inpatient rehab Will try to downsize trach next week, should help with speech and swallowing  CARDIOVASCULAR Lines reviewed A:  Septic shock, resolved Sinus tachycardia  Possible relative adrenal insuff P:  MAP goal > 65 mmHg Continue sched metoprolol 25 mg BID PRN IV metoprolol to maintain HR < 115/min  RENAL A:  AKI, oliguric - likely ATN Cr much improved but UOP still not adequate  P:  Monitor BMET intermittently Monitor I/Os Correct electrolytes as indicated Reconsult Renal if additional HD needed, likely not needed   GASTROINTESTINAL A:  Protein Calorie Malnutrition Dysphagia due to trach tube Mild duodenitis- likely not source for bleeding PEG 1/9 P:  SUP: famotidine Feed per PEG Will need colonoscopy as outpatient for +FOBT/anemia  HEMATOLOGIC A:  Macrocytic Anemia ICU associated anemia without overt blood loss Thrombocytosis, reactive P:  DVT px: SCDs Monitor CBC intermittently Transfuse per usual ICU guidelines  INFECTIOUS A:  Severe sepsis Pneumococcal PNA, treated Pneumococcal bacteremia, treated  Fever, leukocytosis without obvious source P:  - BCx2 12/21 >>strep pneumo sens - UC 12/21 >> ng - Sputum 12/21 >> moderate strep pneumoniae - Blood 12/30>>> ng - Urine 12/30>>> ng - Sputum 12/30>>> abundant WBCs, no organisms - Pleural fluid culture 12/29>>> WBCs, no organisms -  BC 1/6>>> ng  - Abx: Rocephin, start date 12/21>>>12/30. - Abx: Azithro, 12/21>>12/25 - Vancomycin 12/30>>>12/31, 1/6>>1/8 - Zosyn 12/30>>>1/4 - Ceftazidime 1/6>> 01/09  Afebrile, leukocytosis persists Continue to monitor off  abx   ENDOCRINE A:  Rel AI P:  Solu-cortef d/c'd 1/11  NEUROLOGIC A:  Alcohol abuse Alcohol withdrawal seizures 12/23 Anxiety / Depression   P:  - RASS goal: 0 Cont Fentanyl patch Cont Thiamine  Continue quetiapine 200 mg BID Continue Clonazepam 2 mg BID Continue Clonidine 0.1 mg patch Qweekly. Can take off if BP low.  PT/OT consulted>> meets CIR criteria   Labs at discharge Lab Results  Component Value Date   CREATININE 1.16 02/06/2014   BUN 36* 02/06/2014   NA 138 02/06/2014   K 3.6 02/06/2014   CL 104 02/06/2014   CO2 27 02/06/2014   Lab Results  Component Value Date   WBC 16.3* 02/06/2014   HGB 7.8* 02/06/2014   HCT 23.3* 02/06/2014   MCV 100.0 02/06/2014   PLT 686* 02/06/2014   Lab Results  Component Value Date   ALT 12 02/04/2014   AST 14 02/04/2014   ALKPHOS 58 02/04/2014   BILITOT 0.5 02/04/2014   Lab Results  Component Value Date   INR 1.21 01/29/2014   INR 1.29 01/26/2014    Current radiology studies No results found.  Disposition: CIR     Medication List    TAKE these medications        azithromycin 500 MG tablet  Commonly known as:  ZITHROMAX  Take 1 tablet (500 mg total) by mouth daily.     buPROPion 150 MG 24 hr tablet  Commonly known as:  WELLBUTRIN XL  Take 150 mg by mouth every morning.     predniSONE 10 MG tablet  Commonly known as:  DELTASONE  Takes 6 tablets for 1 days, then 5 tablets for 1 days, then 4 tablets for 1 days, then 3 tablets for 1 days, then 2 tabs for 1 days, then 1 tab for 1 days, and then stop.     sertraline 50 MG tablet  Commonly known as:  ZOLOFT  Take 50 mg by mouth at bedtime.          Discharged Condition: fair  Time spent on discharge greater than 40 minutes.  Vital signs at Discharge. Temp:  [98.1 F (36.7 C)-99.9 F (37.7 C)] 99.1 F (37.3 C) (01/14 1120) Pulse Rate:  [93-108] 98 (01/14 1246) Resp:  [13-29] 19 (01/14 1246) BP: (97-128)/(52-87) 123/87 mmHg (01/14  1120) SpO2:  [90 %-97 %] 97 % (01/14 1316) FiO2 (%):  [28 %] 28 % (01/14 1316) Office follow up Special Information or instructions. Per CIR. Signed: Richardson Landry Minor ACNP Maryanna Shape PCCM Pager [phone] till 3 pm If no answer page [phone] 02/08/2014, 1:42 PM

## 2014-02-08 NOTE — Evaluation (Signed)
Clinical/Bedside Swallow Evaluation Patient Details  Name: Allen Wood MRN: [id number] Date of Birth: 05/09/1970  Today's Date: 02/08/2014 Time: 1400-1430 SLP Time Calculation (min) (ACUTE ONLY): 30 min  Past Medical History:  Past Medical History  Diagnosis Date  . Alcohol abuse   . Depression   . Anxiety   . Pneumonia    Past Surgical History:  Past Surgical History  Procedure Laterality Date  . Mouth surgery      teeth removed.  . Tracheostomy  01/30/14    feinstein  . Esophagogastroduodenoscopy N/A 02/03/2014    Procedure: ESOPHAGOGASTRODUODENOSCOPY (EGD);  Surgeon: Ladene Artist, MD;  Location: [facility] ENDOSCOPY;  Service: Endoscopy;  Laterality: N/A;  bedside/trach/vent  . Peg placement N/A 02/03/2014    Procedure: PERCUTANEOUS ENDOSCOPIC GASTROSTOMY (PEG) PLACEMENT;  Surgeon: Ladene Artist, MD;  Location: [facility] ENDOSCOPY;  Service: Endoscopy;  Laterality: N/A;   HPI:  44 y/o M, smoker / ETOH abuse, admitted 12/21 with ongoing fatigue, cough with productive yellow sputum, R posterior back pain, chills & sweats. Work up found patient to have RLL PNA with positive strep antigen. Developed worsening respiratory failure 12/22 with intubation, trach 1/5, thoracotomy 1/10, off CRRT 1/9.  Currently with #8 cuffed Shiley; trach collar.    Assessment / Plan / Recommendation Clinical Impression  Pt participated in limited clinical swallow evaluation.  PMV in place for assessment; pt expressing great desire to drink water.  Demonstrated adequate mastication, palpable swallow response, and RR within range of safe swallow/ventilatory coordination.   Ice chips tolerated well; small sips water elicited immediate cough response.  Given length of initial oral intubation and presence of trach, recommend proceeding with instrumental swallow study prior to initiating diet.  Spoke with pt and SLP who will be following pt on CIR re: results and recs.  Pt may have limited ice chips with PMV in place  pending MBS.      Aspiration Risk  Moderate    Diet Recommendation Ice chips PRN after oral care (with PMV in place)   Medication Administration: Via alternative means    Other  Recommendations Recommended Consults: MBS Oral Care Recommendations: Oral care Q4 per protocol   Follow Up Recommendations  Inpatient Rehab      Swallow Study Prior Functional Status       General HPI: 44 y/o M, smoker / ETOH abuse, admitted 12/21 with ongoing fatigue, cough with productive yellow sputum, R posterior back pain, chills & sweats. Work up found patient to have RLL PNA with positive strep antigen. Developed worsening respiratory failure 12/22 with intubation, trach 1/5, thoracotomy 1/10, off CRRT 1/9.  Currently with #8 cuffed Shiley; trach collar.  Type of Study: Bedside swallow evaluation Previous Swallow Assessment: none per records Diet Prior to this Study: NPO;PEG tube Temperature Spikes Noted: No Respiratory Status: Trach Trach Size and Type: #8;Cuff History of Recent Intubation: Yes Length of Intubations (days): 13 days Date extubated: 01/29/14 (trach 1/4) Behavior/Cognition: Alert;Cooperative;Pleasant mood;Requires cueing Oral Cavity - Dentition: Adequate natural dentition Self-Feeding Abilities: Able to feed self Patient Positioning: Upright in bed Baseline Vocal Quality: Clear Volitional Cough: Strong Volitional Swallow: Able to elicit    Oral/Motor/Sensory Function Overall Oral Motor/Sensory Function: Appears within functional limits for tasks assessed   Ice Chips Ice chips: Within functional limits Presentation: Spoon   Thin Liquid Thin Liquid: Impaired Presentation: Spoon Pharyngeal  Phase Impairments: Suspected delayed Swallow;Cough - Immediate    Nectar Thick Nectar Thick Liquid: Not tested   Honey Thick Honey Thick Liquid: Not  tested   Puree Puree: Not tested   Solid Shaunak Kreis L. [place], [place] CCC/SLP Pager [phone]     Solid: Not tested       Juan Quam  Laurice 02/08/2014,4:49 PM

## 2014-02-08 NOTE — Progress Notes (Signed)
Trach sutures removed.  Pt tolerated well.  Site cleaned and new dressing sponge applied.  ATC changed out.  VSS, sats 93% on 5L/28%.  Pt tolerating PMV well.  Plan for trach change next week per MD note.

## 2014-02-08 NOTE — Progress Notes (Signed)
NUTRITION FOLLOW UP  Intervention:    Continue TF via PEG, change to Osmolite 1.2 at 75 ml/h to provide 2160 kcals (100% of re-estimated needs), 100 gm protein (100% of re-estimated needs), 1476 ml free water daily.  Nutrition Dx:   Inadequate oral intake related to inability to eat as evidenced by NPO; ongoing  Goal:   Intake to meet >90% of estimated nutrition needs; met.  Monitor:   Weight trend, labs, TF tolerance, ability to start PO diet  Assessment:   Patient admitted 12/21 with ongoing fatigue, cough with productive yellow sputum, R posterior back pain, chills & sweats. Work up found patient to have RLL PNA with positive strep antigen.   S/P PEG placement on 1/9. Pt is receiving Vital AF 1.2 at 75 ml/hr, meeting nutrition goals.  S/P tracheostomy on 1/5. Patient is currently on trach collar.  SLP following for ability to complete swallow evaluation.  Height: Ht Readings from Last 1 Encounters:  02/06/14 5' 11"  (1.803 m)    Weight Status:   Wt Readings from Last 1 Encounters:  02/03/14 165 lb 12.6 oz (75.2 kg)  01/20/14 167 lb (76 kg)  Re-estimated needs:  Kcal: 2000-2200 Protein: 100-110 gm  Fluid: 2 L  Skin: Intact  Diet Order: Diet NPO time specified   Intake/Output Summary (Last 24 hours) at 02/08/14 1131 Last data filed at 02/08/14 1019  Gross per 24 hour  Intake   1140 ml  Output   1150 ml  Net    -10 ml    Last BM: 1/14 (flexiseal in place)   Labs:   Recent Labs Lab 02/01/14 1600 02/02/14 0330 02/02/14 1500 02/03/14 0428 02/04/14 0433 02/06/14 0445  NA 136 136 135  --  137 138  K 4.3 4.4 4.9  --  4.6 3.6  CL 106 103 101  --  104 104  CO2 22 25 24   --  27 27  BUN 7 6 <5*  --  9 36*  CREATININE 1.29 1.31 1.43*  --  1.66* 1.16  CALCIUM 7.8* 8.0* 8.2*  --  8.7 9.5  MG  --  2.2  --  2.3 2.3  --   PHOS 3.4  --  3.3  --   --   --   GLUCOSE 124* 88 94  --  74 103*    CBG (last 3)  No results for input(s): GLUCAP in the last 72  hours.  Scheduled Meds: . antiseptic oral rinse  7 mL Mouth Rinse QID  . chlorhexidine  15 mL Mouth Rinse BID  . clonazePAM  2 mg Per Tube BID  . cloNIDine  0.1 mg Transdermal Weekly  . fentaNYL  75 mcg Transdermal Q72H  . ferrous sulfate  300 mg Per Tube BID WC  . folic acid  1 mg Per Tube Daily  . ipratropium-albuterol  3 mL Nebulization Q6H  . metoprolol tartrate  25 mg Per Tube BID  . multivitamin  5 mL Per Tube Daily  . neomycin-bacitracin-polymyxin  1 application Topical Daily  . nicotine  21 mg Transdermal Daily  . pantoprazole sodium  40 mg Per Tube Daily  . thiamine  100 mg Per Tube Daily    Continuous Infusions: . sodium chloride 10 mL/hr at 01/31/14 1319  . feeding supplement (OSMOLITE 1.2 CAL)      Molli Barrows, RD, LDN, [place] Pager [phone] After Hours Pager [phone]

## 2014-02-09 ENCOUNTER — Inpatient Hospital Stay (HOSPITAL_COMMUNITY): Payer: Medicaid Other

## 2014-02-09 ENCOUNTER — Inpatient Hospital Stay (HOSPITAL_COMMUNITY): Payer: Medicaid Other | Admitting: *Deleted

## 2014-02-09 ENCOUNTER — Inpatient Hospital Stay (HOSPITAL_COMMUNITY): Payer: Medicaid Other | Admitting: Occupational Therapy

## 2014-02-09 ENCOUNTER — Inpatient Hospital Stay (HOSPITAL_COMMUNITY): Payer: Medicaid Other | Admitting: Speech Pathology

## 2014-02-09 DIAGNOSIS — F1020 Alcohol dependence, uncomplicated: Secondary | ICD-10-CM

## 2014-02-09 DIAGNOSIS — R509 Fever, unspecified: Secondary | ICD-10-CM

## 2014-02-09 DIAGNOSIS — G7281 Critical illness myopathy: Principal | ICD-10-CM

## 2014-02-09 DIAGNOSIS — R1312 Dysphagia, oropharyngeal phase: Secondary | ICD-10-CM

## 2014-02-09 DIAGNOSIS — Z931 Gastrostomy status: Secondary | ICD-10-CM

## 2014-02-09 LAB — URINALYSIS, ROUTINE W REFLEX MICROSCOPIC
BILIRUBIN URINE: NEGATIVE
Glucose, UA: NEGATIVE mg/dL
Ketones, ur: NEGATIVE mg/dL
Leukocytes, UA: NEGATIVE
Nitrite: NEGATIVE
PH: 5.5 (ref 5.0–8.0)
PROTEIN: 30 mg/dL — AB
Specific Gravity, Urine: 1.021 (ref 1.005–1.030)
UROBILINOGEN UA: 0.2 mg/dL (ref 0.0–1.0)

## 2014-02-09 LAB — COMPREHENSIVE METABOLIC PANEL
ALT: 13 U/L (ref 0–53)
AST: 18 U/L (ref 0–37)
Albumin: 1.8 g/dL — ABNORMAL LOW (ref 3.5–5.2)
Alkaline Phosphatase: 46 U/L (ref 39–117)
Anion gap: 12 (ref 5–15)
BUN: 28 mg/dL — ABNORMAL HIGH (ref 6–23)
CO2: 30 mmol/L (ref 19–32)
Calcium: 9.1 mg/dL (ref 8.4–10.5)
Chloride: 103 mEq/L (ref 96–112)
Creatinine, Ser: 0.81 mg/dL (ref 0.50–1.35)
GFR calc Af Amer: >90 mL/min (ref >90)
GFR calc non Af Amer: >90 mL/min (ref >90)
GLUCOSE: 99 mg/dL (ref 70–99)
Potassium: 4.1 mmol/L (ref 3.5–5.1)
SODIUM: 145 mmol/L (ref 135–145)
TOTAL PROTEIN: 5.4 g/dL — AB (ref 6.0–8.3)
Total Bilirubin: 0.4 mg/dL (ref 0.3–1.2)

## 2014-02-09 LAB — CBC WITH DIFFERENTIAL/PLATELET
Basophils Absolute: 0.1 10*3/uL (ref 0.0–0.1)
Basophils Relative: 1 % (ref 0–1)
EOS ABS: 0.9 10*3/uL — AB (ref 0.0–0.7)
Eosinophils Relative: 5 % (ref 0–5)
HCT: 22.6 % — ABNORMAL LOW (ref 39.0–52.0)
HEMOGLOBIN: 7.2 g/dL — AB (ref 13.0–17.0)
LYMPHS ABS: 2.3 10*3/uL (ref 0.7–4.0)
Lymphocytes Relative: 12 % (ref 12–46)
MCH: 33 pg (ref 26.0–34.0)
MCHC: 31.9 g/dL (ref 30.0–36.0)
MCV: 103.7 fL — ABNORMAL HIGH (ref 78.0–100.0)
MONO ABS: 2.3 10*3/uL — AB (ref 0.1–1.0)
Monocytes Relative: 12 % (ref 3–12)
NEUTROS ABS: 14.1 10*3/uL — AB (ref 1.7–7.7)
NEUTROS PCT: 72 % (ref 43–77)
Platelets: 498 10*3/uL — ABNORMAL HIGH (ref 150–400)
RBC: 2.18 MIL/uL — ABNORMAL LOW (ref 4.22–5.81)
RDW: 15.2 % (ref 11.5–15.5)
WBC: 19.7 10*3/uL — AB (ref 4.0–10.5)

## 2014-02-09 LAB — URINE MICROSCOPIC-ADD ON

## 2014-02-09 LAB — VITAMIN B12: VITAMIN B 12: 871 pg/mL (ref 211–911)

## 2014-02-09 MED ORDER — FREE WATER
200.0000 mL | Freq: Every day | Status: DC
  Administered 2014-02-09 – 2014-02-12 (×14): 200 mL

## 2014-02-09 MED ORDER — OSMOLITE 1.5 CAL PO LIQD
350.0000 mL | Freq: Four times a day (QID) | ORAL | Status: DC
  Administered 2014-02-09 – 2014-02-12 (×11): 350 mL
  Filled 2014-02-09 (×17): qty 474

## 2014-02-09 MED ORDER — PRO-STAT SUGAR FREE PO LIQD
30.0000 mL | Freq: Every day | ORAL | Status: DC
  Administered 2014-02-09 – 2014-02-11 (×3): 30 mL
  Filled 2014-02-09 (×4): qty 30

## 2014-02-09 MED ORDER — OSMOLITE 1.5 CAL PO LIQD
1000.0000 mL | ORAL | Status: DC
  Filled 2014-02-09 (×8): qty 1000

## 2014-02-09 MED ORDER — SODIUM CHLORIDE 0.9 % IJ SOLN
10.0000 mL | INTRAMUSCULAR | Status: DC | PRN
  Administered 2014-02-09 – 2014-02-10 (×3): 10 mL
  Filled 2014-02-09 (×2): qty 40

## 2014-02-09 MED ORDER — SODIUM CHLORIDE 0.9 % IJ SOLN: 10.0000 mL | Freq: Two times a day (BID) | INTRAMUSCULAR | Status: DC

## 2014-02-09 NOTE — Evaluation (Signed)
Physical Therapy Assessment and Plan  Patient Details  Name: Fowler Antos MRN: [id number] Date of Birth: 1970/02/28  PT Diagnosis: Difficulty walking, Impaired cognition and Muscle weakness Rehab Potential: Good ELOS: 12-14   Today's Date: 02/09/2014 PT Individual Time: 1310-1416 PT Individual Time Calculation (min): 66 min    Problem List:  Patient Active Problem List   Diagnosis Date Noted  . 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 Status post gastrostomy 02/08/2014  . Anemia due to chronic blood loss   . Occult blood in stools   . Feeding difficulties   . Dysphagia, oropharyngeal phase   . Abdominal distention   . Acute respiratory distress   . Encephalopathy acute 01/27/2014  . Alcohol withdrawal delirium   . Seizure   . Acute respiratory failure with hypoxia   . Anemia 01/16/2014  . Tachycardia 01/16/2014  . Solitary pulmonary nodule 01/16/2014  . Pleural effusion 01/16/2014  . Bacteremia 01/16/2014  . Acute respiratory failure 01/15/2014  . Fatigue 01/15/2014  . Sepsis 01/15/2014  . Dehydration 01/15/2014  . CAP (community acquired pneumonia) 09/14/2013  . Nicotine abuse 09/14/2013  . EtOH dependence 09/14/2013    Past Medical History:  Past Medical History  Diagnosis Date  . Alcohol abuse   . Depression   . Anxiety   . Pneumonia    Past Surgical History:  Past Surgical History  Procedure Laterality Date  . Mouth surgery      teeth removed.  . Tracheostomy  01/30/14    feinstein  . Esophagogastroduodenoscopy N/A 02/03/2014    Procedure: ESOPHAGOGASTRODUODENOSCOPY (EGD);  Surgeon: Ladene Artist, MD;  Location: [facility] ENDOSCOPY;  Service: Endoscopy;  Laterality: N/A;  bedside/trach/vent  . Peg placement N/A 02/03/2014    Procedure: PERCUTANEOUS ENDOSCOPIC GASTROSTOMY (PEG) PLACEMENT;  Surgeon: Ladene Artist, MD;  Location: [facility] ENDOSCOPY;  Service: Endoscopy;  Laterality: N/A;    Assessment & Plan Clinical  Impression:Timon Noxon is a 44 y.o. Male with h/o alcohol & tobacco abuse, LLL PNA August 2015 who was admitted 01/15/2014 with productive cough, SOB, right back pain and fever due to RLL PNA with positive strep antigen and pneumococcal bacteremia. He was treated with broad spectrum antibiotics but progressive respiratory failure requiring intubation 01/16/14 . Neurology services consulted 01/18/2014 due to recurrent seizures and Cranial CT scan was negative. He was loaded with Keppra and EEG unremarkable. Seizures have resolved and Dr. Doy Mince felt seizure were related to alcohol withdrawal and no antiepileptic needed. Treated with high dose thiamine due to concerns of Wernicke's encephalopathy. He had difficulty with vent wean and trach placed on 01/30/13 by CCM. GI consulted for input on heme+ stools with drop in Hgb to 6.4 as well as high gastric residual with inability to tolerate tube feeds. EGD revealed mild duodenitis and PEG placed on 01/06 by Dr. Fuller Plan. He was transfused with 2 units PRBC. Hospital course complicated by acute renal failure due to ATN with decrease in UOP requiring CVVHD to help with fluid overload. Has required pressors due to hypotensive shock. Echocardiogram completed with ejection fraction of 65% no wall motion abnormalities.  He tolerated extubation and is tolerating ATC as well as well as PMSV trials with ST. Plans for trach to be downsized next week as secretions improve. Blood pressures have been stable off pressors. He required left thoracocentesis of 1.2 L transudative fluid on 02/04/14. UOP has improved with improvement in renal status and nephrology has signed off. He has completed his antibiotic regimen with  persistent leucocytosis noted. He is afebrile and CCM recommends monitoring off antibiotics. 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 CIR recommended by MD and rehab team and patient subsequently  admitted today  Patient transferred to CIR on 02/08/2014 .   Patient currently requires min with mobility secondary to muscle weakness and muscle joint tightness, decreased cardiorespiratoy endurance and decreased oxygen support, decreased problem solving, decreased safety awareness, decreased memory and delayed processing and decreased standing balance and decreased balance strategies.  Prior to hospitalization, patient was independent  with mobility and lived with Spouse, Family in a House home.  Home access is 5Stairs to enter.  Patient will benefit from skilled PT intervention to maximize safe functional mobility, minimize fall risk and decrease caregiver burden for planned discharge home with 24 hour supervision.  Anticipate patient will benefit from follow up [place] at discharge.  PT - End of Session Activity Tolerance: Tolerates < 10 min activity, no significant change in vital signs Endurance Deficit: Yes Endurance Deficit Description: Pt needed seated rest following 1-2 min standing task and between each activity. 02 sats >90% at 40% FiO2 PT Assessment Rehab Potential (ACUTE/IP ONLY): Good Barriers to Discharge: Other (comment) (TBD) PT Patient demonstrates impairments in the following area(s): Balance;Behavior;Endurance;Nutrition;Safety PT Transfers Functional Problem(s): Bed Mobility;Bed to Chair;Car;Furniture PT Locomotion Functional Problem(s): Ambulation;Stairs PT Plan PT Intensity: Minimum of 1-2 x/day ,45 to 90 minutes PT Frequency: 5 out of 7 days PT Duration Estimated Length of Stay: 12-14 PT Treatment/Interventions: Ambulation/gait training;Balance/vestibular training;Cognitive remediation/compensation;Community reintegration;Discharge planning;Disease management/prevention;DME/adaptive equipment instruction;Functional mobility training;Neuromuscular re-education;Pain management;Patient/family education;Psychosocial support;Skin care/wound management;Stair training;Therapeutic  Activities;Therapeutic Exercise;UE/LE Strength taining/ROM;Wheelchair propulsion/positioning PT Transfers Anticipated Outcome(s): Mod I PT Locomotion Anticipated Outcome(s): Supervision with LRAD PT Recommendation Follow Up Recommendations: Home health PT;24 hour supervision/assistance Patient destination: Home Equipment Recommended: To be determined Equipment Details: TBD if pt will need RW at [place] PT tx initiated upon eval for activity tolerance and therex for stretching/strengthening. Pt oriented to PT POC and goals. Assisted pt with sips of nectar thick juice with max cues for safety. Pt with PMSV on 40% FiO2 with VSS throughout, except HR >100 bpm. Pt instructed on functional ambulation with RW x40' with Min A overall, distance limited by fatigue. Pt provided with instruction and cues for RW management. Performed seated HS and heel cord stretching as well as unsupported sitting edge of mat for increased activity tolerance. Pt left up in bed >30deg with bed alarm on and all needs in reach.   PT Evaluation Precautions/Restrictions Precautions Precautions: Other (comment);Fall (Trach collar) Precaution Comments: trach, peg, flexiseal Restrictions Weight Bearing Restrictions: No General   Vital SignsTherapy Vitals Temp: 99.6 F (37.6 C) Temp Source: Oral Pulse Rate: (!) 109 Resp: 18 BP: 126/81 mmHg Patient Position (if appropriate): Sitting Oxygen Therapy SpO2: 95 % O2 Device: Tracheostomy Collar O2 Flow Rate (L/min): 8 L/min FiO2 (%): 40 % Pain none   Home Living/Prior Functioning Home Living Living Arrangements: Spouse/significant other;Children Available Help at Discharge: Family;Available 24 hours/day Type of Home: House Home Access: Stairs to enter [employer] of Steps: 5 Entrance Stairs-Rails: Can reach both Home Layout: One level  Lives With: Spouse;Family Prior Function Level of Independence: Independent with basic  ADLs;Independent with gait  Able to Take Stairs?: Yes Vocation: On disability (Per chart; was working FT until 8/15) Vision/Perception  Vision - History Baseline Vision: Wears glasses only for reading  Cognition Overall Cognitive Status: Impaired/Different from baseline Arousal/Alertness: Awake/alert Orientation Level:  Oriented X4 Memory: Impaired Memory Impairment: Decreased recall of new information Awareness: Impaired Awareness Impairment: Emergent impairment Problem Solving: Impaired Problem Solving Impairment: Functional basic Comments: Pt asked for water minutes after explaining need for ordering thickener. Began gulping nectar thin following instruction for small sips. Sensation Sensation Light Touch: Appears Intact Stereognosis: Not tested Hot/Cold: Appears Intact Proprioception: Appears Intact Coordination Gross Motor Movements are Fluid and Coordinated: Yes Fine Motor Movements are Fluid and Coordinated: Yes Motor  Motor Motor: Within Functional Limits Motor - Skilled Clinical Observations: Severely deconditioned and decreased muscle weakness  Mobility Bed Mobility Bed Mobility: Supine to Sit Supine to Sit: 5: Supervision Supine to Sit Details (indicate cue type and reason): Safety S due to trach/02 line and PEG Transfers Transfers: Yes Locomotion  Ambulation Ambulation: Yes Ambulation/Gait Assistance: 4: Min assist Ambulation Distance (Feet): 15 Feet Assistive device: 2 person hand held assist Ambulation/Gait Assistance Details: Distance limited by fatigue Stairs / Additional Locomotion Stairs: Yes Stairs Assistance: 4: Min assist Stairs Assistance Details (indicate cue type and reason): Steadyign assist and safety cues Stair Management Technique: Two rails;Step to pattern;Forwards Number of Stairs: 5 Height of Stairs: 6 Wheelchair Mobility Wheelchair Mobility: Yes Wheelchair Assistance: 4: Energy manager: Both upper  extremities Wheelchair Parts Management: Needs assistance Distance: 96  Trunk/Postural Assessment  Cervical Assessment Cervical Assessment: Exceptions to [facility] (Forward head posture) Thoracic Assessment Thoracic Assessment: Within Functional Limits Lumbar Assessment Lumbar Assessment: Within Functional Limits Postural Control Postural Control: Deficits on evaluation Trunk Control: mild ataxia due to deconditioning, weakness. A/P sway in standing  Balance Balance Balance Assessed: Yes Static Sitting Balance Static Sitting - Level of Assistance: 5: Stand by assistance Dynamic Sitting Balance Dynamic Sitting - Level of Assistance: 5: Stand by assistance Static Standing Balance Static Standing - Level of Assistance: 5: Stand by assistance Dynamic Standing Balance Dynamic Standing - Level of Assistance: 4: Min assist Extremity Assessment  RUE Assessment RUE Assessment: Within Functional Limits (3+/5 per OT note) RUE Strength RUE Overall Strength: Deficits;Other (Comment) (3+/5) LUE Assessment LUE Assessment: Exceptions to [facility] (3+/5 per OT note) LUE Strength LUE Overall Strength: Deficits;Other (Comment) (3+/5) RLE Assessment RLE Assessment: Exceptions to [facility] RLE Strength RLE Overall Strength Comments: 4-/5 throughout except hip flexor 4/5; decreased muscular endurance LLE Assessment LLE Assessment: Exceptions to [facility] LLE Strength LLE Overall Strength Comments: 4-/5 throughout except hip flexor 4/5; decreased muscular endurance  FIM:  FIM - Bed/Chair Transfer Bed/Chair Transfer Assistive Devices: Arm rests;Walker Bed/Chair Transfer: 5: Supine > Sit: Supervision (verbal cues/safety issues);4: Sit > Supine: Min A (steadying pt. > 75%/lift 1 leg);4: Bed > Chair or W/C: Min A (steadying Pt. > 75%) FIM - Locomotion: Wheelchair Distance: 96 Locomotion: Wheelchair: 2: Travels 50 - 149 ft with minimal assistance (Pt.>75%) FIM - Locomotion: Ambulation Locomotion: Ambulation Assistive  Devices: Administrator Ambulation/Gait Assistance: 4: Min assist Locomotion: Ambulation: 1: Travels less than 50 ft with minimal assistance (Pt.>75%) FIM - Locomotion: Stairs Locomotion: Scientist, physiological: Hand rail - 2 Locomotion: Stairs: 2: Up and Down 4 - 11 stairs with minimal assistance (Pt.>75%)   Refer to Care Plan for Long Term Goals  Recommendations for other services: None  Discharge Criteria: Patient will be discharged from PT if patient refuses treatment 3 consecutive times without medical reason, if treatment goals not met, if there is a change in medical status, if patient makes no progress towards goals or if patient is discharged from hospital.  The above assessment, treatment plan, treatment alternatives and goals  were discussed and mutually agreed upon: by patient   Kennieth Rad, PT, DPT  02/09/2014, 4:42 PM

## 2014-02-09 NOTE — Progress Notes (Signed)
Patient information reviewed and entered into eRehab system by Mikal Blasdell, RN, CRRN, PPS Coordinator.  Information including medical coding and functional independence measure will be reviewed and updated through discharge.    

## 2014-02-09 NOTE — Progress Notes (Signed)
Large left pleural effusion on cxr. CCS aware. Have asked IR to tap this. Ok for them to do so tomorrow. Pt is stable clinically. Tolerated therapies today.  Meredith Staggers, MD, [employer] 02/09/2014

## 2014-02-09 NOTE — Progress Notes (Signed)
[facility]ICAL [facility]IC[facility] & REHABILITATION     PROGRESS NOTE    Subjective/Complaints: Had fever last night of 101.3.  Pt denies having chills. Remainder of ROS limited by trach and cognitive status  Objective: Vital Signs: Blood pressure 125/80, pulse 111, temperature 98.8 F (37.1 C), temperature source Oral, resp. rate 18, height 5\' 11"  (1.803 m), weight 73.5 kg (162 lb 0.6 oz), SpO2 95 %. No results found. No results for input(s): WBC, HGB, HCT, PLT in the last 72 hours. No results for input(s): NA, K, CL, GLUCOSE, BUN, CREATININE, CALCIUM in the last 72 hours.  Invalid input(s): CO CBG (last 3)  No results for input(s): GLUCAP in the last 72 hours.  Wt Readings from Last 3 Encounters:  02/09/14 73.5 kg (162 lb 0.6 oz)  02/03/14 75.2 kg (165 lb 12.6 oz)  09/14/13 65.726 kg (144 lb 14.4 oz)    Physical Exam:  Constitutional: He appears well-developed. He has a sickly appearance.  Sitter in room. Signing for something to drink.  HENT:  Head: Normocephalic and atraumatic.  Dry oral mucosa with dried yellowish secretions on teeth.  Eyes: Conjunctivae are normal. Pupils are equal, round, and reactive to light.  Neck:  # 8 cuffed trach in place with frequent   secretions. Diateck cath left neck.  Cardiovascular: Regular rhythm. Tachycardia present.  HR 100's  Respiratory: Effort normal. He has diffuse rhonchi. Very wet sounding especially in upper airways Occasional productive coughing GI: Normal appearance and bowel sounds are normal. Distention: mild distension. There is tenderness.  PEG site tender but clean and dry.  Genitourinary:  Foley  in place.  Musculoskeletal: He exhibits no edema or tenderness.  Neurological: He is alert.  Able to phonate occasionally. Moves all four.distracted.  Motor strength is 3+ to 4-/5 bilateral deltoid,, bicep tricep, 5 minus grip, 3/5 hip flexors 4 minus knee extensors 4+ ankle dorsiflexors Withdraws to pain on all  4's Psych: restless  Assessment/Plan: 1. Functional deficits secondary to critical illness myopathy which require 3+ hours per day of interdisciplinary therapy in a comprehensive inpatient rehab setting. Physiatrist is providing close team supervision and 24 hour management of active medical problems listed below. Physiatrist and rehab team continue to assess barriers to discharge/monitor patient progress toward functional and medical goals. FIM:                   Comprehension Comprehension Mode: Auditory Comprehension: 5-Understands basic 90% of the time/requires cueing < 10% of the time  Expression Expression Mode: Verbal Expression Assistive Devices: 6-Talk trach valve Expression: 4-Expresses basic 75 - 89% of the time/requires cueing 10 - 24% of the time. Needs helper to occlude trach/needs to repeat words.  Social Interaction Social Interaction: 2-Interacts appropriately 25 - 49% of time - Needs frequent redirection.  Problem Solving Problem Solving: 2-Solves basic 25 - 49% of the time - needs direction more than half the time to initiate, plan or complete simple activities  Memory Memory: 5-Recognizes or recalls 90% of the time/requires cueing < 10% of the time  Medical Problem List and Plan: 1. Functional deficits secondary to Critical Illness myopathy.  2. DVT Prophylaxis/Anticoagulation: Mechanical: Sequential compression devices, below knee Bilateral lower extremities 3. Pain Management: Continue fentanyl 75 mcg/hr and wean over the next few days.  4. Anxiety disorder/Mood: Was on Wellbutrin and Zoloft at home. Continue Klonopin bid for now. May need to resume zoloft. LCSW to follow for evaluation and support.  5. Neuropsych: This patient is capable of  making decisions on his own behalf. 6. Skin/Wound Care: Routine pressure relief measures. Maintain adequate nutritional and hydration status.  7. Fluids/Electrolytes/Nutrition: Continue tube feeds. Add  water boluses for hydration.  8. Dysphagia: Continue TF for now. Question FEES soon as able to tolerate PMSV with good breath support.  9. VDRF: Continue nebs every 6 hours to help mobilize thick secretions. 10. Resting Tachycardia: Due to deconditioning--on metoprolol 25 mg bid to help maintain HR < 115 bpm  11. Anemia: Will need colonoscopy on outpatient basis. Monitor H/H with transfusion prn drop in Hgb <7.0.  12. Acute renal failure: Resolving with steady downward trend in Cr. Now with dehydration with rise BUN - 36. Addition of water flushes should help with this.  13. Fever: re-check cxr today, ua and ucx  -peg site looks fine  - no areas of skin breakdown  LOS (Days) 1 A FACE TO FACE EVALUATION WAS PERFORMED  SWARTZ,ZACHARY T 02/09/2014 8:13 AM

## 2014-02-09 NOTE — Progress Notes (Signed)
Allen Blake, MD Physician Signed Physical Medicine and Rehabilitation Consult Note 02/06/2014 7:09 AM  Related encounter: ED to Hosp-Admission (Discharged) from 01/15/2014 in [place] Collapse All        Physical Medicine and Rehabilitation Consult Reason for Consult: Severe debilitation/acute respiratory failure Referring Physician: Critical care   HPI: Allen Wood is a 44 y.o. right handed male with history of alcohol and tobacco abuse and recently treated in August 2015 for left lower lobe pneumonia. By report patient lives with his wife and was independent prior to admission. Admitted 01/15/2014 with productive cough and shortness of breath as well as low-grade fever with chills. Chest x-ray with consolidation of much of the right lower lobe along with suspected right pleural effusion. White blood cell count 10,500. Alcohol level negative. Patient with positive strep antigen. Maintained on broad-spectrum antibiotics. Patient with progressive respiratory failure requiring intubation. Neurology services consulted 01/18/2014 with reported seizure. Cranial CT scan negative. He was loaded with Keppra. EEG was unremarkable. Question seizure related to alcohol withdrawal. Underwent thoracentesis of right-sided pleural effusion with 1000 mL of pleural fluid obtained and later with findings of left pleural effusion with 1.2 L obtained after thoracentesis 02/04/2014. Renal service is consulted 01/25/2014 for progressive increasing creatinine of 1.47-1.91. Renal ultrasound was unremarkable. Echocardiogram completed with ejection fraction of 65% no wall motion abnormalities. Patient with prolonged ventilatory support necessitating tracheostomy tube placement 01/30/2013 per Dr. Titus Mould as well as PEG tube placement for nutritional support 01/31/2014 per Dr. Lucio Edward.. Patient currently remains nothing by mouth. Renal function has slowly improved  placed creatinine 1.16. Reported fall found on the floor of bedside 02/05/2014 without reported injury and a sitter was provided for safety. Physical therapy evaluation completed 02/05/2014 with recommendations of physical medicine rehabilitation consult.  Patient up for the first time with physical therapy today. Rectal tube, Foley, trach collar. O2 turned up to 35% to maintain sats during therapy. Heart rate 130s with sit to stand. Two-person assist. Patient able to march in place for short period of time holding onto the walker and with therapy support Good sitting balance in recliner  Review of Systems  Unable to perform ROS: mental acuity   Past Medical History  Diagnosis Date  . Alcohol abuse   . Depression   . Anxiety   . Pneumonia    Past Surgical History  Procedure Laterality Date  . Mouth surgery      teeth removed.  . Tracheostomy  01/30/14    feinstein  . Esophagogastroduodenoscopy N/A 02/03/2014    Procedure: ESOPHAGOGASTRODUODENOSCOPY (EGD); Surgeon: Ladene Artist, MD; Location: [facility] ENDOSCOPY; Service: Endoscopy; Laterality: N/A; bedside/trach/vent  . Peg placement N/A 02/03/2014    Procedure: PERCUTANEOUS ENDOSCOPIC GASTROSTOMY (PEG) PLACEMENT; Surgeon: Ladene Artist, MD; Location: [facility] ENDOSCOPY; Service: Endoscopy; Laterality: N/A;   Family History  Problem Relation Age of Onset  . Cancer Mother     lung  . Cancer Father     kidney   Social History:  reports that he has been smoking Cigarettes. He has a 10 pack-year smoking history. He has never used smokeless tobacco. He reports that he drinks alcohol. He reports that he does not use illicit drugs. Allergies: No Known Allergies Medications Prior to Admission  Medication Sig Dispense Refill  . buPROPion (WELLBUTRIN XL) 150 MG 24 hr tablet Take 150 mg by mouth every morning.    . sertraline (ZOLOFT) 50 MG tablet Take 50 mg by  mouth at bedtime.    Marland Kitchen azithromycin (ZITHROMAX) 500 MG tablet Take 1 tablet (500 mg total) by mouth daily. (Patient not taking: Reported on 01/15/2014) 5 tablet 0  . predniSONE (DELTASONE) 10 MG tablet Takes 6 tablets for 1 days, then 5 tablets for 1 days, then 4 tablets for 1 days, then 3 tablets for 1 days, then 2 tabs for 1 days, then 1 tab for 1 days, and then stop. (Patient not taking: Reported on 01/15/2014) 21 tablet 0    Home: [place] expects to be discharged to:: Private residence Living Arrangements: Spouse/significant other Available Help at Discharge: Family, Available 24 hours/day Type of Home: House Home Access: Stairs to enter Technical brewer of Steps: 3 Entrance Stairs-Rails: Left, Right Home Layout: One level Home Equipment: None  Functional History: Prior Function Level of Independence: Independent Comments: PLOF provided by spouse Functional Status:  Mobility: Bed Mobility Overal bed mobility: Needs Assistance Bed Mobility: Supine to Sit, Sit to Supine Supine to sit: Max assist, HOB elevated Sit to supine: Max assist General bed mobility comments: Pt lethargic and only able to open his eyes grossly 15 sec at at time. Max assist for lower body out of the bed with pt able to assist pushing his trunk up. With return to bed assist to control trunk and min assist to fully elevate legs onto surface. 2 person total assist to scoot to [place] transfer comment: unable at this time due to lethargy      ADL:    Cognition: Cognition Overall Cognitive Status: Difficult to assess Orientation Level: Intubated/Tracheostomy - Unable to assess Cognition Arousal/Alertness: Lethargic Behavior During Therapy: Flat affect Overall Cognitive Status: Difficult to assess Difficult to assess due to: Tracheostomy, Level of arousal  Blood pressure 134/88, pulse 107, temperature 99.4 F (37.4 C), temperature source Oral, resp.  rate 20, height 5\' 11"  (1.803 m), weight 75.2 kg (165 lb 12.6 oz), SpO2 94 %. Physical Exam  HENT:  Head: Normocephalic.  Eyes: EOM are normal.  No nystagmus  Neck:  Tracheostomy tube in place  Cardiovascular: Normal rate and regular rhythm.  Respiratory:  Decreased breath sounds at the bases with limited inspiratory effort  GI: Soft. Bowel sounds are normal.  PEG tube intact  Neurological: He is alert.  Patient appears a bit restless. He does make eye contact with examiner. He is appropriate for yes and no head nods for general questions. He does follow simple commands but was easily distracted  Motor strength is 3/5 bilateral deltoid 4 at bilateral biceps triceps and grip 3+ bilateral hip abduction and hip adduction, 4 minus bilateral hip flexion, 4 at knee extensors ankle dorsiflexor and plantar flexor Sensation appears intact to light touch in the upper limbs.   Lab Results Last 24 Hours    Results for orders placed or performed during the hospital encounter of 01/15/14 (from the past 24 hour(s))  Glucose, capillary Status: None   Collection Time: 02/05/14 7:48 AM  Result Value Ref Range   Glucose-Capillary 99 70 - 99 mg/dL  Basic metabolic panel Status: Abnormal   Collection Time: 02/06/14 4:45 AM  Result Value Ref Range   Sodium 138 135 - 145 mmol/L   Potassium 3.6 3.5 - 5.1 mmol/L   Chloride 104 96 - 112 mEq/L   CO2 27 19 - 32 mmol/L   Glucose, Bld 103 (H) 70 - 99 mg/dL   BUN 36 (H) 6 - 23 mg/dL   Creatinine, Ser 1.16 0.50 - 1.35  mg/dL   Calcium 9.5 8.4 - 10.5 mg/dL   GFR calc non Af Amer 75 (L) >90 mL/min   GFR calc Af Amer 86 (L) >90 mL/min   Anion gap 7 5 - 15  CBC Status: Abnormal   Collection Time: 02/06/14 4:45 AM  Result Value Ref Range   WBC 16.3 (H) 4.0 - 10.5 K/uL   RBC 2.33 (L) 4.22 - 5.81 MIL/uL   Hemoglobin 7.8 (L) 13.0 - 17.0 g/dL   HCT 23.3 (L) 39.0 -  52.0 %   MCV 100.0 78.0 - 100.0 fL   MCH 33.5 26.0 - 34.0 pg   MCHC 33.5 30.0 - 36.0 g/dL   RDW 14.8 11.5 - 15.5 %   Platelets 686 (H) 150 - 400 K/uL      Imaging Results (Last 48 hours)    Dg Chest Port 1 View  02/04/2014 CLINICAL DATA: Post left-sided thoracentesis. EXAM: PORTABLE CHEST - 1 VIEW COMPARISON: 02/04/2014; 01/31/2014 FINDINGS: Grossly unchanged cardiac silhouette and mediastinal contours. Stable positioning of support apparatus. Interval reduction / near resolution of left-sided pleural effusion post thoracentesis. No pneumothorax. Unchanged small to moderate sized partially layering right-sided pleural effusion. Improved aeration of the left lower lung with persistent right mid and bibasilar heterogeneous/consolidative opacities. No new focal airspace opacities. Unchanged bones. IMPRESSION: 1. Interval reduction/near resolution of left-sided pleural effusion post thoracentesis. No pneumothorax. 2. Stable position of support apparatus. 3. Unchanged small to moderate-sized partially layering right-sided effusion. 4. Improved aeration of the left lung with persistent right mid and bibasilar opacities, atelectasis versus infiltrate Electronically Signed By: Sandi Mariscal M.D. On: 02/04/2014 13:14   US Thoracentesis Asp Pleural Space W/img Guide  02/04/2014 INDICATION: Symptomatic left sided pleural effusion EXAM: US THORACENTESIS ASP PLEURAL SPACE W/IMG GUIDE COMPARISON: Thoracentesis done by Pulmonology bedside 01/23/14 right sided 1 liter removed. MEDICATIONS: None COMPLICATIONS: None immediate TECHNIQUE: Informed written consent was obtained from the patient's wife after a discussion of the risks, benefits and alternatives to treatment. A timeout was performed prior to the initiation of the procedure. Initial ultrasound scanning demonstrates a left pleural effusion that is larger in size than a small right pleural effusion that has a loculated  component. The lower chest was prepped and draped in the usual sterile fashion. 1% lidocaine was used for local anesthesia. Under direct ultrasound guidance, a 19 gauge, 7-cm, Yueh catheter was introduced. An ultrasound image was saved for documentation purposes. The thoracentesis was performed. The catheter was removed and a dressing was applied. The patient tolerated the procedure well without immediate post procedural complication. Post procedure CXR has been ordered. FINDINGS: A total of approximately 1.2 liters of serous fluid was removed. Requested samples were sent to the laboratory. IMPRESSION: Successful ultrasound-guided left sided thoracentesis yielding 1.2 liters of pleural fluid. Read By: Tsosie Billing PA-C Electronically Signed By: Sandi Mariscal M.D. 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 Does the need for close, 24 hr/day medical supervision in concert with the patient's rehab needs make it unreasonable for this patient to be served in a less intensive setting? Yes 2. Co-Morbidities requiring supervision/potential complications: Status post tracheostomy, dysphagia, status post gastrostomy, alcohol abuse with post-withdrawal encephalopathy 3. Due to bladder management, bowel management, safety, skin/wound care, disease management, medication administration, pain management and patient education, does the patient require 24 hr/day rehab nursing? Yes 4. Does the patient require coordinated care of a physician, rehab nurse, PT (1-2 hrs/day, 55 days/week),  OT (1-2 hrs/day, 5 days/week) and SLP (0.5-1 hrs/day, 5 days/week) to address physical and functional deficits in the context of the above medical diagnosis(es)? Yes Addressing deficits in the following areas: balance, endurance, locomotion, strength, transferring, bowel/bladder control, bathing, dressing, feeding, grooming, toileting,  cognition, speech, language, swallowing and psychosocial support 5. Can the patient actively participate in an intensive therapy program of at least 3 hrs of therapy per day at least 5 days per week? No 6. The potential for patient to make measurable gains while on inpatient rehab is should be good once able to tolerate intensive program 7. Anticipated functional outcomes upon discharge from inpatient rehab are supervision with PT, supervision with OT, supervision with SLP. 8. Estimated rehab length of stay to reach the above functional goals is: 17-20 days 9. Does the patient have adequate social supports and living environment to accommodate these discharge functional goals? Yes 10. Anticipated D/C setting: Home 11. Anticipated post D/C treatments: [place] therapy 12. Overall Rehab/Functional Prognosis: good  RECOMMENDATIONS: This patient's condition is appropriate for continued rehabilitative care in the following setting: CIR Patient has agreed to participate in recommended program. Potentially Note that insurance prior authorization may be required for reimbursement for recommended care.  Comment: Will need rectal tube out, heart rate will need stay less than 120 with therapy, will need to tolerate up in chair 3 hours per day. Should be ready in 3-6 days    02/06/2014       Revision History     Date/Time User Provider Type Action   02/06/2014 9:03 AM Allen Blake, MD Physician Sign   02/06/2014 7:29 AM Cathlyn Parsons, PA-C Physician Assistant [place]   View Details Report       Routing History

## 2014-02-09 NOTE — Progress Notes (Signed)
Retta Diones, RN Rehab Admission Coordinator Signed Physical Medicine and Rehabilitation PMR Pre-admission 02/08/2014 12:39 PM  Related encounter: ED to Hosp-Admission (Discharged) from 01/15/2014 in Time Collapse All   PMR Admission Coordinator Pre-Admission Assessment  Patient: Allen Wood is an 44 y.o., male MRN: [id number] DOB: Oct 01, 1970 Height: 5\' 11"  (180.3 cm) Weight: 75.2 kg (165 lb 12.6 oz)  Insurance Information HMO: PPO: PCP: IPA: 80/20: OTHER:  PRIMARY: Medicaid [place] access Policy#: [id number] Subscriber: [name] CM Name: Phone#: Fax#:  Pre-Cert#: Employer: FT electrician Benefits: Phone #: [phone] Name: Automated Eff. Date: Eligible 02/08/14 Deduct: Out of Pocket Max: Life Max:  CIR: SNF:  Outpatient: Co-Pay:  Home Health: Co-Pay:  DME: Co-Pay:  Providers:   Medicaid Application Date: Case Manager:  Disability Application Date: Case Worker:   Emergency Contact Information Contact Information    Name Relation Home Work Mobile   [place] Spouse   [phone]     Current Medical History  Patient Admitting Diagnosis: Critical illness myopathy, sepsis, PNA  History of Present Illness: A 44 y.o. right handed male with history of alcohol and tobacco abuse and recently treated in August 2015 for left lower lobe pneumonia. By report patient lives with his wife and was independent prior to admission. Admitted 01/15/2014 with productive cough and shortness of breath as well as low-grade fever with chills. Chest x-ray with consolidation of much of the right lower lobe along with suspected right pleural  effusion. White blood cell count 10,500. Alcohol level negative. Patient with positive strep antigen. Maintained on broad-spectrum antibiotics. Patient with progressive respiratory failure requiring intubation. Neurology services consulted 01/18/2014 with reported seizure. Cranial CT scan negative. He was loaded with Keppra. EEG was unremarkable and Keppra discontinued. Question seizure related to alcohol withdrawal. Underwent thoracentesis of left-sided pleural effusion with 1.2 L of serous fluid removed 02/04/2014. Renal service is consulted 01/25/2014 for progressive increasing creatinine of 1.47-1.91. Renal ultrasound was unremarkable. Echocardiogram completed with ejection fraction of 65% no wall motion abnormalities. Patient with prolonged ventilatory support necessitating tracheostomy tube placement 01/30/2013 per Dr. Titus Mould. Patient with poor nutritional support as well as decrease in hemoglobin 6.7 positive Hemoccult stools. Gastroenterology services consulted Dr. Fuller Plan underwent EGD 02/03/2014 with findings of mild duodenitis no clear source for heme positive stools patient also with PEG tube placement at that time due to decreased nutritional support as well as questionable dysphagia. Patient currently remains nothing by mouth. Hemoglobin has stabilized at 7.8 after recent transfusion. Renal function has slowly improved placed creatinine 1.16. Reported fall, found on the floor of bedside 02/05/2014 without reported injury and a sitter was provided for safety. Physical and occupational therapy evaluations completed 02/05/2014 with recommendations of physical medicine rehabilitation consult. Patient to be admitted for comprehensive inpatient rehabilitation program.   Past Medical History  Past Medical History  Diagnosis Date  . Alcohol abuse   . Depression   . Anxiety   . Pneumonia     Family History  family history includes Cancer in his father and mother.  Prior  Rehab/Hospitalizations: Has been sick since 08/15 with PNA. No previous rehab admissions.  Current Medications   Current facility-administered medications:  . 0.9 % sodium chloride infusion, , Intravenous, Continuous, Juanito Doom, MD, Last Rate: 10 mL/hr at 01/31/14 1319 . acetaminophen (TYLENOL) solution 650 mg, 650 mg, Per Tube, Q6H PRN, Kara Mead V, MD, 650 mg at 01/30/14 1644 . antiseptic oral rinse (CPC / CETYLPYRIDINIUM CHLORIDE 0.05%)  solution 7 mL, 7 mL, Mouth Rinse, QID, Brandi L Ollis, NP, 7 mL at 02/08/14 1120 . bisacodyl (DULCOLAX) suppository 10 mg, 10 mg, Rectal, Daily PRN, Juanito Doom, MD, 10 mg at 01/18/14 1040 . chlorhexidine (PERIDEX) 0.12 % solution 15 mL, 15 mL, Mouth Rinse, BID, Donita Brooks, NP, 15 mL at 02/08/14 0803 . clonazePAM (KLONOPIN) tablet 2 mg, 2 mg, Per Tube, BID, Wilhelmina Mcardle, MD, 2 mg at 02/08/14 1119 . cloNIDine (CATAPRES - Dosed in mg/24 hr) patch 0.1 mg, 0.1 mg, Transdermal, Weekly, Wilhelmina Mcardle, MD, 0.1 mg at 02/03/14 1000 . docusate (COLACE) 50 MG/5ML liquid 100 mg, 100 mg, Oral, BID PRN, 100 mg at 01/22/14 0954 **OR** docusate (COLACE) 50 MG/5ML liquid 100 mg, 100 mg, Per Tube, BID PRN, Kara Mead V, MD, 100 mg at 01/24/14 1428 . feeding supplement (OSMOLITE 1.2 CAL) liquid 1,000 mL, 1,000 mL, Per Tube, Continuous, Dalene Carrow, RD . fentaNYL ([place] - dosed mcg/hr) 75 mcg, 75 mcg, Transdermal, Q72H, Wilber Oliphant, MD, 75 mcg at 02/07/14 0953 . ferrous sulfate 300 (60 FE) MG/5ML syrup 300 mg, 300 mg, Per Tube, BID WC, Juanito Doom, MD, 300 mg at 02/08/14 1118 . folic acid (FOLVITE) tablet 1 mg, 1 mg, Per Tube, Daily, Wilhelmina Mcardle, MD, 1 mg at 02/08/14 1118 . hydrALAZINE (APRESOLINE) injection 10-40 mg, 10-40 mg, Intravenous, Q4H PRN, Wilhelmina Mcardle, MD . ipratropium-albuterol (DUONEB) 0.5-2.5 (3) MG/3ML nebulizer solution 3 mL, 3 mL, Nebulization, Q6H, Wilber Oliphant, MD, 3 mL at  02/08/14 0908 . LORazepam (ATIVAN) injection 1 mg, 1 mg, Intravenous, Q2H PRN, Wilhelmina Mcardle, MD . metoprolol (LOPRESSOR) injection 2.5-5 mg, 2.5-5 mg, Intravenous, Q3H PRN, Wilhelmina Mcardle, MD . metoprolol tartrate (LOPRESSOR) 25 mg/10 mL oral suspension 25 mg, 25 mg, Per Tube, BID, Wilhelmina Mcardle, MD, 25 mg at 02/08/14 1118 . multivitamin liquid 5 mL, 5 mL, Per Tube, Daily, Irine Seal V, MD, 5 mL at 02/08/14 1118 . neomycin-bacitracin-polymyxin (NEOSPORIN) ointment 1 application, 1 application, Topical, Daily, Vena Rua, PA-C, 1 application at 33/82/50 1117 . nicotine (NICODERM CQ - dosed in mg/24 hours) patch 21 mg, 21 mg, Transdermal, Daily, Jacques Earthly, MD, 21 mg at 02/08/14 1118 . pantoprazole sodium (PROTONIX) 40 mg/20 mL oral suspension 40 mg, 40 mg, Per Tube, Daily, Juanito Doom, MD, 40 mg at 02/08/14 1119 . 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   Home Assistive Devices / Equipment Home Assistive Devices/Equipment: None Home Equipment: None  Prior Functional Level Prior Function Level of Independence: Independent Comments: per PT note (spouse report)  Current Functional Level Cognition  Overall Cognitive Status: Difficult to assess Difficult to assess due to: Tracheostomy Orientation Level: Oriented X4   Extremity Assessment (includes Sensation/Coordination)  Upper Extremity Assessment: Generalized weakness Lower Extremity Assessment: Generalized weakness   ADLs   Overall ADL's : Needs assistance/impaired Eating/Feeding: NPO Eating/Feeding Details (indicate cue type and reason): PEG tube Grooming: Minimal assistance, Standing Grooming Details (indicate cue type and reason): after ambulating to the sink, pt required to sit. he was able  to stand at sink after brief rest and wash his face. Pt leaned his forearms on the sink and required VC's to stand up straight.  Upper Body Bathing: Set up, Sitting Lower Body Bathing: Minimal assistance, Sit to/from stand Upper Body Dressing : Set up, Sitting Lower Body Dressing: Minimal assistance, Sit to/from stand Toilet Transfer: Minimal assistance, Ambulation, RW, Moderate assistance Toilet Transfer Details (indicate cue type and reason): sit<>stand x 3 (once from bed, twice from armchair). Initially Min (A) however fatigues quickly and requires Mod (A).  Functional mobility during ADLs: Minimal assistance, Rolling walker General ADL Comments: Pt presents with decreased endurance and fatigues quickly during functional tasks. Pt able to ambulate to sink to wash his face before returning to chair to sit up. HR max 125, RR 28-29.     Mobility  Overal bed mobility: Needs Assistance Bed Mobility: Supine to Sit Supine to sit: Min guard Sit to supine: Max assist General bed mobility comments: VC's and min guard for safety. Pt slightly impulsive.     Transfers  Overall transfer level: Needs assistance Equipment used: Rolling walker (2 wheeled) Transfers: Sit to/from Stand Sit to Stand: Mod assist General transfer comment: Initially min (A), however fatigues quickly and requires Mod (A). VC's for hand placement and safety.     Ambulation / Gait / Stairs / Wheelchair Mobility  Ambulation/Gait Ambulation/Gait assistance: Min assist, +2 safety/equipment Ambulation Distance (Feet): 15 Feet Assistive device: Rolling walker (2 wheeled) Gait Pattern/deviations: Narrow base of support, Trunk flexed, Shuffle Gait  velocity interpretation: Below normal speed for age/gender General Gait Details: max cues for posture, position in RW, increased BOS and increased stride. Chair to follow. Pt HR up to 138 with gait with activity terminated due to tachycardia with sats 94% on 35% FiO2 trach collar    Posture / Balance Dynamic Sitting Balance Sitting balance - Comments: Pt initially able to maintain trunk control EOB but within 4 sec excessive flexion with max assist to control. 2 min EOB then pt had to return to supine due to lethargy    Special needs/care consideration BiPAP/CPAP Has sleep apnea, but not on CPAP or BIPAP at home CPM No Continuous Drip IV KVO Dialysis No  Life Vest No Oxygen Currently on 28% trach collar/5 L O2 Special Bed No Trach Size Yes, #8 with PMV when with speech therapy Wound Vac (area) No  Skin No. Does have a calcium nodule on his right calf area.  Bowel mgmt: Currently with rectal tube and loose stools Bladder mgmt: Foley catheter in place Diabetic mgmt No    Previous Home Environment Living Arrangements: Spouse/significant other Available Help at Discharge: Family, Available 24 hours/day Type of Home: House Home Layout: One level Home Access: Stairs to enter Entrance Stairs-Rails: Left, Right Entrance Stairs-Number of Steps: 3 Home Care Services: No Additional Comments: information gathered from PT note (per pt's spouse)  Discharge Living Setting Plans for Discharge Living Setting: Patient's home, House, Lives with (comment) (Lives with wife, 90 yo son with Yehuda [place], 8yo son.) Type of Home at Discharge: House Discharge Home Layout: One level Discharge Home Access: Stairs to enter Entrance Stairs-Number of Steps: 4-5 steps at front entry Does the patient have any problems obtaining your medications?: No  Social/Family/Support Systems Patient Roles: Spouse, Parent (Has a wife, 2yo son, 89 yo son with Cherlyn Cushing syndrome,18  yo Dtr) Contact Information: Govind Furey - wife Anticipated Caregiver: Kennyth Lose - wife Anticipated Caregiver's Contact Information: Kennyth Lose [phone] Ability/Limitations of Caregiver: Wife currently not working  and is finishing up her CNA class Caregiver Availability: 24/7 Discharge Plan Discussed with Primary Caregiver: Yes Is Caregiver In Agreement with Plan?: Yes Does Caregiver/Family have Issues with Lodging/Transportation while Pt is in Rehab?: No  Goals/Additional Needs Patient/Family Goal for Rehab: PT/OT Supervision, ST Supervision/min assist goals Expected length of stay: 17-20 days Cultural Considerations: [employer] Dietary Needs: NPO with PEG tube for feedings Equipment Needs: TBD Additional Information: Patient is very thirsty and verbalized this frequently. Pt/Family Agrees to Admission and willing to participate: Yes Program Orientation Provided & Reviewed with Pt/Caregiver Including Roles & Responsibilities: Yes  Decrease burden of Care through IP rehab admission: N/A  Possible need for SNF placement upon discharge: Not planned  Patient Condition: This patient's medical and functional status has changed since the consult dated: 02/06/14 in which the Rehabilitation Physician determined and documented that the patient's condition is appropriate for intensive rehabilitative care in an inpatient rehabilitation facility. See "History of Present Illness" (above) for medical update. Functional changes are: Currently requiring mod assist for transfers and ambulated 15 ft RW. Patient's medical and functional status update has been discussed with the Rehabilitation physician and patient remains appropriate for inpatient rehabilitation. Will admit to inpatient rehab today.  Preadmission Screen Completed By: Retta Diones, 02/08/2014 12:53 PM ______________________________________________________________________  Discussed status with Dr. Letta Pate on 02/08/14 at 1254 and  received telephone approval for admission today.  Admission Coordinator: Retta Diones, time1254/Date01/14/16          Cosigned by: Charlett Blake, MD at 02/08/2014 1:10 PM  Revision History

## 2014-02-09 NOTE — Evaluation (Signed)
Speech Language Pathology Assessment and Plan  Patient Details  Name: Allen Wood MRN: [id number] Date of Birth: 03-31-1970  SLP Diagnosis: Dysphagia;Cognitive Impairments;Voice disorder  Rehab Potential: Good ELOS: 14-16 days    Today's Date: 02/09/2014 SLP Individual Time: 1030-1130 SLP Individual Time Calculation (min): 60 min   Problem List:  Patient Active Problem List   Diagnosis Date Noted  . 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 Status post gastrostomy 02/08/2014  . Anemia due to chronic blood loss   . Occult blood in stools   . Feeding difficulties   . Dysphagia, oropharyngeal phase   . Abdominal distention   . Acute respiratory distress   . Encephalopathy acute 01/27/2014  . Alcohol withdrawal delirium   . Seizure   . Acute respiratory failure with hypoxia   . Anemia 01/16/2014  . Tachycardia 01/16/2014  . Solitary pulmonary nodule 01/16/2014  . Pleural effusion 01/16/2014  . Bacteremia 01/16/2014  . Acute respiratory failure 01/15/2014  . Fatigue 01/15/2014  . Sepsis 01/15/2014  . Dehydration 01/15/2014  . CAP (community acquired pneumonia) 09/14/2013  . Nicotine abuse 09/14/2013  . EtOH dependence 09/14/2013   Past Medical History:  Past Medical History  Diagnosis Date  . Alcohol abuse   . Depression   . Anxiety   . Pneumonia    Past Surgical History:  Past Surgical History  Procedure Laterality Date  . Mouth surgery      teeth removed.  . Tracheostomy  01/30/14    feinstein  . Esophagogastroduodenoscopy N/A 02/03/2014    Procedure: ESOPHAGOGASTRODUODENOSCOPY (EGD);  Surgeon: Ladene Artist, MD;  Location[facility] END[facility]COPY;  Service: Endoscopy;  Laterality: N/A;  bedside/trach/vent  . Peg placement N/A 02/03/2014    Procedure: PERCUTANEOUS ENDOSCOPIC GASTROSTOMY (PEG) PLACEMENT;  Surgeon: Ladene Artist, MD;  Location: [facility] ENDOSCOPY;  Service: Endoscopy;  Laterality: N/A;    Assessment / Plan /  Recommendation Clinical Impression Allen Wood is a 44 year old male with history of alcohol & tobacco abuse and left lower lobe PNA in August 2015 who was admitted 01/15/2014 with productive cough, SOB, right back pain and fever due to right lower lobe PNA with positive strep antigen and pneumococcal bacteremia. He was treated with broad spectrum antibiotics but had progressive respiratory failure requiring intubation 01/16/14. Neurology services consulted 01/18/2014 due to recurrent seizures due to alcohol withdrawal and no antiepileptic needed. Patient treated with high dose thiamine due to concerns of Wernicke's encephalopathy. He had difficulty with vent wean and trach placed 01/30/14.  GI consulted for input on heme+ stools with drop in Hgb to 6.4 as well as high gastric residual with inability to tolerate tube feeds. EGD revealed mild duodenitis and PEG placed on 01/31/14 by Dr. Fuller Plan.  Patient tolerated extubation and is tolerating ATC as well as well as PMSV trials with SLP with recommendations for trach downsize.  He required left thoracocentesis of 1.2 L transudative fluid on 02/04/14. 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 CIR recommended by MD and rehab team and patient subsequently admitted 02/08/14.  Orders received; Cognitive-linguistic, PMSV and Bedside swallow evaluations completed.  Patient demonstrates moderately severe cognitive deficits characterized by poor awareness of deficits, recall and problem solving with impulsivity and perseveration.  Patient also exhibits impairments in expressive verbal communication due to #8 cuffed Shiley trach.  Patient is currently able to tolerate PMSV for 45-60 minutes with vitals remaining at patient's baseline; patient's vocal quality is breathy  with low intensity.  Trials of ice chips were WFL and sips of water resulted in intermittent over s/s of aspiration; as a result, an objective  assessment is warranted to evaluation swallow function.  Additionally, patient requires skilled SLP services to address deficits and maximize overall functional independence prior to discharge home with 24/7 supervision.          Skilled Therapeutic Interventions          Cognitive-linguistic, PMSV and Bedside Swallow evaluations completed with results and recommendations reviewed with patient and family.     SLP Assessment  Patient will need skilled Speech Lanaguage Pathology Services during CIR admission    Recommendations  Patient may use Passy-Muir Speech Valve: During all therapies with supervision;Caregiver trained to provide supervision;During PO intake/meals PMSV Supervision: Full MD: Please consider changing trach tube to : Smaller size;Cuffless Recommended Consults: MBS Diet Recommendations: Ice chips PRN after oral care Medication Administration: Via alternative means Oral Care Recommendations: Oral care Q4 per protocol Patient destination: Home Follow up Recommendations: 24 hour supervision/assistance;Home Health SLP;Outpatient SLP Equipment Recommended: To be determined    SLP Frequency 5 out of 7 days   SLP Treatment/Interventions Cognitive remediation/compensation;Cueing hierarchy;Dysphagia/aspiration precaution training;Environmental controls;Functional tasks;Internal/external aids;Patient/family education;Speech/Language facilitation;Therapeutic Activities    Pain Pain Assessment Pain Assessment: No/denies pain Prior Functioning Cognitive/Linguistic Baseline: Within functional limits Type of Home: House  Lives With: Spouse;Family Available Help at Discharge: Family;Available 24 hours/day Vocation: On disability  Short Term Goals: Week 1: SLP Short Term Goal 1 (Week 1): Pt will consume Dys.2 textures and nectar-thick liquids with Mod cues to recall and utilize safe swallow strategies to minimze overt s/s of aspiration SLP Short Term Goal 2 (Week 1): Pt will  demonstrate ability to donn and doff PMSV with Mod I  SLP Short Term Goal 3 (Week 1): Pt will tolerate PMSV during all waking hours with with intermittent supervision  SLP Short Term Goal 4 (Week 1): Pt will request help as needed with Mod question cues  SLP Short Term Goal 5 (Week 1): Pt will solve basic problems realted to safe self-care with Min verbal cues  See FIM for current functional status Refer to Care Plan for Long Term Goals  Recommendations for other services: None  Discharge Criteria: Patient will be discharged from SLP if patient refuses treatment 3 consecutive times without medical reason, if treatment goals not met, if there is a change in medical status, if patient makes no progress towards goals or if patient is discharged from hospital.  The above assessment, treatment plan, treatment alternatives and goals were discussed and mutually agreed upon: by patient and by family  Carmelia Roller., [place]  [place] 02/09/2014, 8:35 PM

## 2014-02-09 NOTE — Progress Notes (Signed)
Social Work Assessment and Plan Social Work Assessment and Plan  Patient Details  Name: Allen Wood MRN: [id number] Date of Birth: 1970-02-24  Today's Date: 02/09/2014  Problem List:  Patient Active Problem List   Diagnosis Date Noted  . 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 Status post gastrostomy 02/08/2014  . Anemia due to chronic blood loss   . Occult blood in stools   . Feeding difficulties   . Dysphagia, oropharyngeal phase   . Abdominal distention   . Acute respiratory distress   . Encephalopathy acute 01/27/2014  . Alcohol withdrawal delirium   . Seizure   . Acute respiratory failure with hypoxia   . Anemia 01/16/2014  . Tachycardia 01/16/2014  . Solitary pulmonary nodule 01/16/2014  . Pleural effusion 01/16/2014  . Bacteremia 01/16/2014  . Acute respiratory failure 01/15/2014  . Fatigue 01/15/2014  . Sepsis 01/15/2014  . Dehydration 01/15/2014  . CAP (community acquired pneumonia) 09/14/2013  . Nicotine abuse 09/14/2013  . EtOH dependence 09/14/2013   Past Medical History:  Past Medical History  Diagnosis Date  . Alcohol abuse   . Depression   . Anxiety   . Pneumonia    Past Surgical History:  Past Surgical History  Procedure Laterality Date  . Mouth surgery      teeth removed.  . Tracheostomy  01/30/14    feinstein  . Esophagogastroduodenoscopy N/A 02/03/2014    Procedure: ESOPHAGOGASTRODUODENOSCOPY (EGD);  Surgeon: Ladene Artist, MD;  Location: [facility] ENDOSCOPY;  Service: Endoscopy;  Laterality: N/A;  bedside/trach/vent  . Peg placement N/A 02/03/2014    Procedure: PERCUTANEOUS ENDOSCOPIC GASTROSTOMY (PEG) PLACEMENT;  Surgeon: Ladene Artist, MD;  Location: [facility] ENDOSCOPY;  Service: Endoscopy;  Laterality: N/A;   Social History:  reports that he has been smoking Cigarettes.  He has a 10 pack-year smoking history. He has never used smokeless tobacco. He reports that he drinks alcohol. He reports that he does not use  illicit drugs.  Family / Support Systems Marital Status: Married Patient Roles: Spouse, Parent, Other (Comment) (Employee) Spouse/Significant Other: Kennyth Lose  [phone]-cell Children: Three children Other Supports: Siblings Anticipated Caregiver: Wife  Ability/Limitations of Caregiver: Wife is finsihing up her CNA certification Caregiver Availability: 24/7 Family Dynamics: Close knit small family who are there for one another.  They both have sister's who are supportive and involved.  Wife has been the caregiver of many family members and feels good experiecne with this. Provide support to wife under much stress and many obligations wiht children.  Social History Preferred language: English Religion: Unknown Cultural Background: No issues Education: Scientist, clinical (histocompatibility and immunogenetics) Read: Yes Write: Yes Employment Status: Unemployed Date Retired/Disabled/Unemployed: needs to apply for disability Legal Hisotry/Current Legal Issues: No issues Guardian/Conservator: None-according to MD pt is capable of making his own decisions while here, will make sure wife is here if any are needed to be made   Abuse/Neglect Physical Abuse: Denies Verbal Abuse: Denies Sexual Abuse: Denies Exploitation of patient/patient's resources: Denies Self-Neglect: Denies  Emotional Status Pt's affect, behavior adn adjustment status: Pt has always been independent and taken care of himself and financially provided for his family.  He wants to get to this level again.  Wife reports: " He takes pride in taking care of [place]." She is very supportive of him and will do what is needed.  He really hopes he can drink something after his swallow test he really misses this. Recent Psychosocial Issues: Has been dealing with illness for months-08/2013.  Wife reports treated him for depression which she felt was wrong and misdiagnosis.  Pyschiatric History: Was being treated for depression and on meds, wife feels pneumonia all along and  it continued to get worse. Would benefit from Neuro-psych eval during his stay.  Not at a level can screen for depression at this time and communication needs to get better. Substance Abuse History: ETOH & Tobacco plans to quit both since hasn;t had any since early Dec.  Will address more thoroughout his stay, too early right now  Patient / Family Perceptions, Expectations & Goals Pt/Family understanding of illness & functional limitations: Wife can explain his treatment, surgeries and talks with MD when here to have her questions answered.  She seems to have a good understanding of all that has gone on while in the hospital.  Pt unsure how much he understands, aware of swallow test today and anxious to be able to drink some fluids if he passes. Premorbid pt/family roles/activities: Husband, father, Clinical biochemist, sibling, etc Anticipated changes in roles/activities/participation: resume Pt/family expectations/goals: Pt states: " I want some water."  Wife states: " I hope he does well here and recovers, but will be here to see."  [employer]: Other (Comment) ([facility]) Premorbid Home Care/DME Agencies: None Transportation available at discharge: Wife and daughter Resource referrals recommended: Neuropsychology, Support group (specify)  Discharge Planning Living Arrangements: Spouse/significant other, Children Support Systems: Spouse/significant other, Children, Other relatives, Friends/neighbors, Church/faith community Type of Residence: Private residence Insurance Resources: Medicaid (specify county) ([place]) Museum/gallery curator Resources: Employment Museum/gallery curator Screen Referred: Yes Living Expenses: Lives with family Money Management: Spouse, Patient Does the patient have any problems obtaining your medications?: No Home Management: Wife Patient/Family Preliminary Plans: Return home with wife and three children-24 yo son with Downs Syndrome, 19 yo  daughter-senior in [place] and 78 yo son.  Wife is finishing her CNA certification, but if he requires care she will not be able to work.  Will have apply for SSD and if eligible for any other services. Will await teams' evaluations and assist wiht dishcarge plan. Social Work Anticipated Follow Up Needs: HH/OP, Support Group  Clinical Impression Gentleman who is very restless in bed pulling on gown and tube to his trach.  Aware having swallowing test this afternoon and wants to drink some water. Wife is very attentive and caring of pt. She re-directs him and is calming to him.  She has much on her plate with school, children and pt in the hospital.  Will provide support to her and find any eligible resources for them.  Await team's evaluations.  Elease Hashimoto 02/09/2014, 1:59 PM

## 2014-02-09 NOTE — Evaluation (Signed)
Occupational Therapy Assessment and Plan  Patient Details  Name: Allen Wood MRN: [id number] Date of Birth: 11-30-1970  OT Diagnosis: acute pain, cognitive deficits, muscular wasting and disuse atrophy and muscle weakness (generalized) Rehab Potential: Rehab Potential (ACUTE ONLY): Good ELOS: 12-14 days   Today's Date: 02/09/2014 OT Individual Time: 9937-1696 OT Individual Time Calculation (min): 71 min     Problem List:  Patient Active Problem List   Diagnosis Date Noted  . 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 Status post gastrostomy 02/08/2014  . Anemia due to chronic blood loss   . Occult blood in stools   . Feeding difficulties   . Dysphagia, oropharyngeal phase   . Abdominal distention   . Acute respiratory distress   . Encephalopathy acute 01/27/2014  . Alcohol withdrawal delirium   . Seizure   . Acute respiratory failure with hypoxia   . Anemia 01/16/2014  . Tachycardia 01/16/2014  . Solitary pulmonary nodule 01/16/2014  . Pleural effusion 01/16/2014  . Bacteremia 01/16/2014  . Acute respiratory failure 01/15/2014  . Fatigue 01/15/2014  . Sepsis 01/15/2014  . Dehydration 01/15/2014  . CAP (community acquired pneumonia) 09/14/2013  . Nicotine abuse 09/14/2013  . EtOH dependence 09/14/2013    Past Medical History:  Past Medical History  Diagnosis Date  . Alcohol abuse   . Depression   . Anxiety   . Pneumonia    Past Surgical History:  Past Surgical History  Procedure Laterality Date  . Mouth surgery      teeth removed.  . Tracheostomy  01/30/14    feinstein  . Esophagogastroduodenoscopy N/A 02/03/2014    Procedure: ESOPHAGOGASTRODUODENOSCOPY (EGD);  Surgeon: Ladene Artist, MD;  Location[facility] ENDOSCOPY;  Service: Endoscopy;  Laterality: N/A;  bedside/trach/vent  . Peg placement N/A 02/03/2014    Procedure: PERCUTANEOUS ENDOSCOPIC GASTROSTOMY (PEG) PLACEMENT;  Surgeon: Ladene Artist, MD;  Location: [facility] ENDOSCOPY;   Service: Endoscopy;  Laterality: N/A;    Assessment & Plan Clinical Impression:Allen Wood is a 44 y.o. Male with h/o alcohol & tobacco abuse, LLL PNA August 2015 who was admitted 01/15/2014 with productive cough, SOB, right back pain and fever due to RLL PNA with positive strep antigen and pneumococcal bacteremia. He was treated with broad spectrum antibiotics but progressive respiratory failure requiring intubation 01/16/14 . Neurology services consulted 01/18/2014 due to recurrent seizures and Cranial CT scan was negative. He was loaded with Keppra and EEG unremarkable. Seizures have resolved and  Dr. Doy Mince felt seizure were related to alcohol withdrawal and no antiepileptic needed. Treated with high dose thiamine due to concerns of Wernicke's encephalopathy.   He had difficulty with vent wean and trach placed on 01/30/13 by CCM.  GI consulted for input on heme+ stools with drop in Hgb to 6.4 as well as high gastric residual with inability to tolerate tube feeds. EGD revealed mild duodenitis and  PEG placed on 01/06 by Dr. Fuller Plan. He was transfused with 2 units PRBC.  Hospital course complicated by acute renal failure due to ATN with decrease in UOP requiring CVVHD to help with fluid overload. Has required pressors due to hypotensive shock. Echocardiogram completed with ejection fraction of 65% no wall motion abnormalities.  He tolerated extubation and is tolerating ATC as well as well as PMSV trials with ST. Plans for trach to be downsized next week as secretions improve. Blood pressures have been stable off pressors. He required left thoracocentesis of 1.2 L transudative fluid on 02/04/14. UOP has  improved with improvement in renal status and nephrology has signed off. He has completed his antibiotic regimen with persistent leucocytosis noted. He is afebrile and CCM recommends monitoring off antibiotics. Therapy initiated and patient noted to be significantly deconditioned with critical illness  myopathy. CIR recommended by MD and rehab team and patient subsequently admitted today Patient transferred to CIR on 02/08/2014 .    Patient currently requires max with basic self-care skills secondary to muscle weakness, decreased cardiorespiratoy endurance and decreased oxygen support, decreased awareness, decreased problem solving, decreased memory and delayed processing and decreased sitting balance, decreased standing balance and decreased postural control.  Prior to hospitalization, patient could complete basic self care and transfers independently (per pt report).  Patient will benefit from skilled intervention to increase independence with basic self-care skills prior to discharge home with care partner.  Anticipate patient will require 24 hour supervision and follow up home health.  OT - End of Session Activity Tolerance: Tolerates < 10 min activity with changes in vital signs Endurance Deficit: Yes Endurance Deficit Description: Pt's O2 levels drop to 88 after 1 minute of activity OT Assessment Rehab Potential (ACUTE ONLY): Good OT Patient demonstrates impairments in the following area(s): Balance;Cognition;Endurance;Motor;Pain;Safety OT Basic ADL's Functional Problem(s): Grooming;Bathing;Dressing;Toileting OT Transfers Functional Problem(s): Toilet;Tub/Shower OT Additional Impairment(s): None OT Plan OT Intensity: Minimum of 1-2 x/day, 45 to 90 minutes OT Frequency: 5 out of 7 days OT Duration/Estimated Length of Stay: 12-14 days OT Treatment/Interventions: Cognitive remediation/compensation;Discharge planning;DME/adaptive equipment instruction;Functional mobility training;Patient/family education;Self Care/advanced ADL retraining;Therapeutic Activities;Therapeutic Exercise;UE/LE Strength taining/ROM;Psychosocial support;Pain management;Balance/vestibular training OT Self Feeding Anticipated Outcome(s): To be determined based on swallowing status OT Basic Self-Care Anticipated  Outcome(s): set up with dressing, supervision with bathing OT Toileting Anticipated Outcome(s): mod I OT Bathroom Transfers Anticipated Outcome(s): mod I to toilet, supervision with tub bench transfer OT Recommendation Patient destination: Home Follow Up Recommendations: Home health OT Equipment Recommended: 3 in 1 bedside comode;Tub/shower bench   Skilled Therapeutic Intervention Pt seen for initial evaluation and ADL retraining of bathing and dressing with a focus on activity tolerance and standing balance. Completing the eval and self care routine took extra time today as pt needed A with suctioning and has multiple lines and leads that needed to be adjusted frequently. It was challenging getting an accurate cognitive assessment as pt was not able to tolerate using his passy muir valve the entire session and it was difficult to understand him without it. Pt did sit to EOB with close S, stood with min A and transferred to w/c with min A. His O2 levels dropped to 88% and were only able to resume to 91% with a long rest break. He needs max cues to breath through his nose.  Pt bathed with min A for feet and was able to stand with min A for LB bathing and to pull pants over his hips.  Pt became very fatigued. He is extremely deconditioned. Pt set up in w/c. As this clinician was leaving the room, pt wanted to get in bed. Nurse came in room to assist pt.  OT Evaluation Precautions/Restrictions  Precautions Precautions: Fall Precaution Comments: trach, peg, flexiseal Restrictions Weight Bearing Restrictions: No   Vital Signs Therapy Vitals Pulse Rate: 98 Resp: 20 Patient Position (if appropriate): Sitting Oxygen Therapy SpO2: 92 % O2 Device: Tracheostomy Collar O2 Flow Rate (L/min): 5 L/min FiO2 (%): 28 % Pain  no c/o pain during this session Home Living/Prior Functioning Home Living Available Help at Discharge: Family, Available 24 hours/day  Type of Home: House Home Access: Stairs to  enter [employer] of Steps: 5 Home Layout: One level  Lives With: Spouse, Family Prior Function Level of Independence: Independent with basic ADLs, Independent with gait  Able to Take Stairs?: Yes Vocation: On disability ADL ADL ADL Comments: Refer to FIM Vision/Perception  Vision- History Patient Visual Report: No change from baseline Vision- Assessment Vision Assessment?: No apparent visual deficits  Cognition Overall Cognitive Status: Impaired/Different from baseline Arousal/Alertness: Awake/alert Orientation Level: Oriented X4 Memory: Impaired Memory Impairment: Decreased recall of new information Awareness: Impaired Awareness Impairment: Emergent impairment Problem Solving: Impaired Problem Solving Impairment: Functional basic Sensation Sensation Light Touch: Appears Intact Stereognosis: Not tested Hot/Cold: Appears Intact Proprioception: Appears Intact Coordination Gross Motor Movements are Fluid and Coordinated: Yes Fine Motor Movements are Fluid and Coordinated: Yes Motor  Motor Motor - Skilled Clinical Observations: Severely deconditioned and decreased muscle weakness Mobility    min A for basic transfer Trunk/Postural Assessment  Cervical Assessment Cervical Assessment: Within Functional Limits Thoracic Assessment Thoracic Assessment: Within Functional Limits Lumbar Assessment Lumbar Assessment: Within Functional Limits Postural Control Postural Control: Deficits on evaluation Trunk Control: mild ataxia due to deconditioning, weakness  Balance Static Sitting Balance Static Sitting - Level of Assistance: 5: Stand by assistance Dynamic Sitting Balance Dynamic Sitting - Level of Assistance: 4: Min assist Static Standing Balance Static Standing - Level of Assistance: 4: Min assist Dynamic Standing Balance Dynamic Standing - Level of Assistance: 2: Max assist Extremity/Trunk Assessment RUE Assessment RUE Assessment: Exceptions to [facility] RUE  Strength RUE Overall Strength: Deficits;Other (Comment) (3+/5) LUE Assessment LUE Assessment: Exceptions to [facility] LUE Strength LUE Overall Strength: Deficits;Other (Comment) (3+/5)  FIM:  FIM - Eating Eating Activity: 1: Helper performs IV, parenteral, or tube feeding FIM - Grooming Grooming Steps: Wash, rinse, dry face;Wash, rinse, dry hands Grooming: 2: Patient completes 1 of 4 or 2 of 5 steps FIM - Bathing Bathing Steps Patient Completed: Chest;Right Arm;Left Arm;Abdomen;Front perineal area;Buttocks;Right upper leg;Left upper leg Bathing: 4: Min-Patient completes 8-9 [id number] parts or 75+ percent FIM - Upper Body Dressing/Undressing Upper body dressing/undressing: 0: Wears gown/pajamas-no public clothing FIM - Lower Body Dressing/Undressing Lower body dressing/undressing steps patient completed: Pull pants up/down Lower body dressing/undressing: 2: Max-Patient completed 25-49% of tasks FIM - Toileting Toileting: 0: Activity did not occur FIM - BControl and instrumentation engineerDevices: HOB elevated Bed/Chair Transfer: 5: Supine > Sit: Supervision (verbal cues/safety issues);4: Bed > Chair or W/C: Min A (steadying Pt. > 75%) FIM - TAir cabin crewTransfers: 0-Activity did not occur FIM - TCamera operatorTransfers: 0-Activity did not occur or was simulated   Refer to Care Plan for Long Term Goals  Recommendations for other services: None  Discharge Criteria: Patient will be discharged from OT if patient refuses treatment 3 consecutive times without medical reason, if treatment goals not met, if there is a change in medical status, if patient makes no progress towards goals or if patient is discharged from hospital.  The above assessment, treatment plan, treatment alternatives and goals were discussed and mutually agreed upon: by patient  [facility]1/15/2016, 12:58 PM

## 2014-02-09 NOTE — Progress Notes (Signed)
*  Preliminary Results* Bilateral lower extremity venous duplex completed. Bilateral lower extremities are negative for deep vein thrombosis. There is no evidence of Baker's cyst bilaterally.  12/01/2014  Nahiara Kretzschmar, RVT, RDCS, RDMS  

## 2014-02-09 NOTE — Progress Notes (Signed)
Obtained sputum specimen sent to Lab

## 2014-02-09 NOTE — Care Management Note (Signed)
Inpatient [facility] Individual Statement of Services  Patient Name:  Allen Wood  Date:  02/09/2014  Welcome to the [facility].  Our goal is to provide you with an individualized program based on your diagnosis and situation, designed to meet your specific needs.  With this comprehensive rehabilitation program, you will be expected to participate in at least 3 hours of rehabilitation therapies Monday-Friday, with modified therapy programming on the weekends.  Your rehabilitation program will include the following services:  Physical Therapy (PT), Occupational Therapy (OT), Speech Therapy (ST), 24 hour per day rehabilitation nursing, Therapeutic Recreaction (TR), Neuropsychology, Case Management (Social Worker), Rehabilitation Medicine and Nutrition Services  Weekly team conferences will be held on Wednesday to discuss your progress.  Your Social Worker will talk with you frequently to get your input and to update you on team discussions.  Team conferences with you and your family in attendance may also be held.  Expected length of stay: 12-14 days   Overall anticipated outcome: supervision level  Depending on your progress and recovery, your program may change. Your Social Worker will coordinate services and will keep you informed of any changes. Your Social Worker's name and contact numbers are listed  below.  The following services may also be recommended but are not provided by the [facility] will be made to provide these services after discharge if needed.  Arrangements include referral to agencies that provide these services.  Your insurance has been verified to be:  Medicaid Your primary doctor is:  [facility]  Pertinent information will be shared with your doctor and your  insurance company.  Social Worker:  Ovidio Kin, [place] or ([phone]  Information discussed with and copy given to patient by: Elease Hashimoto, 02/09/2014, 2:07 PM

## 2014-02-09 NOTE — Discharge Instructions (Signed)
Inpatient Rehab Discharge Instructions  [place] [place] Discharge date and time:    Activities/Precautions/ Functional Status: Activity: activity as tolerated Diet:  Wound Care:   Functional status:  ___ No restrictions     ___ Walk up steps independently ___ 24/7 supervision/assistance   ___ Walk up steps with assistance ___ Intermittent supervision/assistance  ___ Bathe/dress independently ___ Walk with walker     ___ Bathe/dress with assistance ___ Walk Independently    ___ Shower independently ___ Walk with assistance    ___ Shower with assistance _X__ No alcohol     ___ Return to work/school ________  Special Instructions:    My questions have been answered and I understand these instructions. I will adhere to these goals and the provided educational materials after my discharge from the hospital.  Patient/Caregiver Signature _______________________________ Date __________  Clinician Signature _______________________________________ Date __________  Please bring this form and your medication list with you to all your follow-up doctor's appointments.

## 2014-02-09 NOTE — Progress Notes (Signed)
Dr Naaman Plummer informed in person of pts temp 2000 last evening of 101 po. Informed tylenol given and recheck temp 99.5po. km

## 2014-02-09 NOTE — Procedures (Signed)
Objective Swallowing Evaluation: Modified Barium Swallowing Study  Patient Details  Name: Allen Wood MRN: [id number] Date of Birth: 13-May-1970  Today's Date: 02/09/2014 Time: 90-1545 30 minutes   Past Medical History:  Past Medical History  Diagnosis Date  . Alcohol abuse   . Depression   . Anxiety   . Pneumonia    Past Surgical History:  Past Surgical History  Procedure Laterality Date  . Mouth surgery      teeth removed.  . Tracheostomy  01/30/14    feinstein  . Esophagogastroduodenoscopy N/A 02/03/2014    Procedure: ESOPHAGOGASTRODUODENOSCOPY (EGD);  Surgeon: Ladene Artist, MD;  Location: [facility] ENDOSCOPY;  Service: Endoscopy;  Laterality: N/A;  bedside/trach/vent  . Peg placement N/A 02/03/2014    Procedure: PERCUTANEOUS ENDOSCOPIC GASTROSTOMY (PEG) PLACEMENT;  Surgeon: Ladene Artist, MD;  Location: [facility] ENDOSCOPY;  Service: Endoscopy;  Laterality: N/A;   HPI:  44 y/o M, smoker / ETOH abuse, admitted 12/21 with ongoing fatigue, cough with productive yellow sputum, R posterior back pain, chills & sweats. Work up found patient to have RLL PNA with positive strep antigen. Developed worsening respiratory failure 12/22 with intubation, trach 1/5, thoracotomy 1/10, off CRRT 1/9.  Currently with #8 cuffed Shiley; trach collar.      Recommendation/Prognosis  Clinical Impression:   Dysphagia Diagnosis: Mild oral phase dysphagia;Mild pharyngeal phase dysphagia Clinical impression: Patient demonstrates a mild oral and pharyngeal phase sensory-motor dysphagia complicated by respiratory status.  Oral phase deficits are characterized by mild weakness resulting in prolonged mastication of regular textures. Pharyngeal phase deficits are characterized by decreased base of tongue retraction, laryngeal elevation and epiglottic inversion resulting in silent penetration with large boluses or consecutive sips of thin consistencies.  Patient also demonstrates moderate vallecular residue with all  consistencies that was reduced with multiple swallows. However, due to patient's poor recall and impulsivity he requires Max-Total assist multimodal cues to recall and utilize safe swallow strategies.  Recommend patient initiate a more conservative diet given respiratory status; Dys.2 textures and nectar-thick liquids with full staff assist and skilled SLP follow up.    Swallow Evaluation Recommendations:  Recommended Consults: MBS Diet Recommendations: Dysphagia 2 (Fine chop);Nectar-thick liquid Liquid Administration via: Cup;No straw Medication Administration: Whole meds with puree (or via PEG) Supervision: Patient able to self feed;Full supervision/cueing for compensatory strategies Compensations: Slow rate;Small sips/bites;Multiple dry swallows after each bite/sip (slow pace) Postural Changes and/or Swallow Maneuvers: Out of bed for meals;Seated upright 90 degrees Oral Care Recommendations: Oral care prior to ice chips;Oral care BID Other Recommendations: Order thickener from pharmacy;Prohibited food (jello, ice cream, thin soups);Have oral suction available;Remove water pitcher Follow up Recommendations: 24 hour supervision/assistance;Home health SLP;Outpatient SLP    Prognosis:  Prognosis for Safe Diet Advancement: Good Barriers to Reach Goals: Cognitive deficits   Individuals Consulted: Consulted and Agree with Results and Recommendations: Patient;PA;RN      SLP Assessment/Plan  Plan:  Potential to Achieve Goals (ACUTE ONLY): Good Potential Considerations (ACUTE ONLY): Ability to learn/carryover information;Co-morbidities   Short Term Goals: Week 1: SLP Short Term Goal 1 (Week 1): Pt will consume Dys.2 textures and nectar-thick liquids with Mod cues to recall and utilize safe swallow strategies to minimze overt s/s of aspiration SLP Short Term Goal 2 (Week 1): Pt will demonstrate ability to donn and doff PMSV with Mod I  SLP Short Term Goal 3 (Week 1): Pt will tolerate PMSV  during all waking hours with with intermittent supervision  SLP Short Term Goal 4 (Week 1): Pt  will request help as needed with Mod question cues  SLP Short Term Goal 5 (Week 1): Pt will solve basic problems realted to safe self-care with Min verbal cues    General: Type of Study: Modified Barium Swallowing Study Reason for Referral: Objectively evaluate swallowing function Previous Swallow Assessment: BSE 02/08/14 Diet Prior to this Study: NPO;PEG tube;Other (Comment) (ice chips afer oral care) Temperature Spikes Noted: No Respiratory Status: Trach Trach Size and Type: #8;Cuff;Deflated;With PMSV in place History of Recent Intubation: Yes Length of Intubations (days): 13 days Date extubated: 01/29/14 Behavior/Cognition: Alert;Cooperative;Pleasant mood;Requires cueing;Doesn't follow directions Oral Cavity - Dentition: Adequate natural dentition Oral Motor / Sensory Function: Within functional limits Self-Feeding Abilities: Able to feed self;Needs assist Patient Positioning: Upright in chair Baseline Vocal Quality: Clear;Low vocal intensity;Breathy Volitional Cough: Strong Volitional Swallow: Able to elicit Anatomy: Within functional limits Pharyngeal Secretions:  (coated with barium in pharynx)   Reason for Referral:   Objectively evaluate swallowing function    Oral Phase: Oral Preparation/Oral Phase Oral Phase: Impaired Oral - Solids Oral - Puree: Within functional limits Oral - Regular: Other (Comment) (prolonged mastication and due to breath support recommend softer solids)   Pharyngeal Phase:  Pharyngeal Phase Pharyngeal Phase: Impaired Pharyngeal - Nectar Pharyngeal - Nectar Cup: Compensatory strategies attempted (Comment);Reduced tongue base retraction;Pharyngeal residue - valleculae;Reduced epiglottic inversion (cues for small sips and a slow pace due to work of breathing ) Penetration/Aspiration details (nectar cup): Material does not enter airway Pharyngeal -  Thin Pharyngeal - Thin Cup: Reduced tongue base retraction;Pharyngeal residue - valleculae;Penetration/Aspiration after swallow;Compensatory strategies attempted (Comment);Reduced epiglottic inversion (cues for cough cleared penetrates) Penetration/Aspiration details (thin cup): Material enters airway, remains ABOVE vocal cords and not ejected out Pharyngeal - Thin Straw: Reduced epiglottic inversion;Reduced tongue base retraction;Penetration/Aspiration after swallow;Pharyngeal residue - valleculae;Compensatory strategies attempted (Comment) (cues for cough cleared penetrates) Penetration/Aspiration details (thin straw): Material enters airway, remains ABOVE vocal cords and not ejected out Pharyngeal - Solids Pharyngeal - Puree: Reduced epiglottic inversion;Reduced tongue base retraction;Pharyngeal residue - valleculae;Compensatory strategies attempted (Comment) (cues for multiple swallows cleared residue ) Pharyngeal - Regular: Reduced epiglottic inversion;Reduced tongue base retraction;Pharyngeal residue - valleculae;Compensatory strategies attempted (Comment) (cues for multiple swallows cleared residue )   Cervical Esophageal Phase  Cervical Esophageal Phase Cervical Esophageal Phase: WFL   GN         Gunnar Fusi, M.A., CCC-SLP [phone]  Khia Dieterich 02/09/2014, 9:16 PM

## 2014-02-09 NOTE — Progress Notes (Signed)
INITIAL NUTRITION ASSESSMENT  DOCUMENTATION CODES Per approved criteria  -Not Applicable   INTERVENTION: Continue increasing rate of bolus tube feedings using Osmolite 1.5 via PEG to new goal rate of 350 ml, 4 times daily with 30 ml Prostat once daily to provide 2200 kcal, 103 grams of protein, and 1065 ml of free water.  Bolus tube feeds will provide 100% of estimated nutrition needs.  Increase free water flushes to 200 ml, 5 times daily. Total water daily: 2065 ml  Will continue to monitor.  NUTRITION DIAGNOSIS: Inadequate oral intake related to inability to eat as evidenced by NPO status.  Goal: Pt to meet >/= 90% of their estimated nutrition needs   Monitor:  TF tolerance, weight trends, labs, I/O's  Reason for Assessment: MD consult for assessment of nutrition requirements/status  44 y.o. male  Admitting Dx: Critical illness myopathy  ASSESSMENT: Pt with h/o alcohol & tobacco abuse, LLL PNA August 2015 who was admitted 01/15/2014 with productive cough, SOB, right back pain and fever due to RLL PNA with positive strep antigen and pneumococcal bacteremia.  S/P PEG placement on 1/9. Pt is currently on trach collar. Pt has been tolerating his tube feeding well with no other difficulties. Pt is now transition over to bolus tube feeding. Pt reports his usual body weight is 141 lbs, which he weighed before admission to the hospital. RD will continue to monitor.  Nutrition Focused Physical Exam:  Subcutaneous Fat:  Orbital Region: N/A Upper Arm Region: WNL Thoracic and Lumbar Region: N/A  Muscle:  Temple Region: N/A Clavicle Bone Region: Moderate depletion Clavicle and Acromion Bone Region: Moderate depletion Scapular Bone Region: N/A Dorsal Hand: N/A Patellar Region: Moderate depletion Anterior Thigh Region: Moderate depletion Posterior Calf Region: N/A  Edema: non-pitting UE  Labs: High BUN  Height: Ht Readings from Last 1 Encounters:  02/08/14 5\' 11"  (1.803  m)    Weight: Wt Readings from Last 1 Encounters:  02/09/14 162 lb 0.6 oz (73.5 kg)    Ideal Body Weight: 172 lbs  % Ideal Body Weight: 94%  Wt Readings from Last 10 Encounters:  02/09/14 162 lb 0.6 oz (73.5 kg)  02/03/14 165 lb 12.6 oz (75.2 kg)  09/14/13 144 lb 14.4 oz (65.726 kg)    Usual Body Weight: 141 lbs (per pt report)  % Usual Body Weight: 115%  BMI:  Body mass index is 22.61 kg/(m^2).  Estimated Nutritional Needs: Kcal: 2000-2300 Protein: 95-110 grams Fluid: 2 - 2.3L/day  Skin: non-pitting UE edema   Diet Order: Diet NPO time specified  EDUCATION NEEDS: -No education needs identified at this time   Intake/Output Summary (Last 24 hours) at 02/09/14 0952 Last data filed at 02/09/14 0600  Gross per 24 hour  Intake   1355 ml  Output    325 ml  Net   1030 ml    Last BM: 1/14  Labs:   Recent Labs Lab 02/02/14 1500 02/03/14 0428 02/04/14 0433 02/06/14 0445 02/09/14 0728  NA 135  --  137 138 145  K 4.9  --  4.6 3.6 4.1  CL 101  --  104 104 103  CO2 24  --  27 27 30   BUN <5*  --  9 36* 28*  CREATININE 1.43*  --  1.66* 1.16 0.81  CALCIUM 8.2*  --  8.7 9.5 9.1  MG  --  2.3 2.3  --   --   PHOS 3.3  --   --   --   --  GLUCOSE 94  --  74 103* 99    CBG (last 3)  No results for input(s): GLUCAP in the last 72 hours.  Scheduled Meds: . antiseptic oral rinse  7 mL Mouth Rinse QID  . chlorhexidine  15 mL Mouth Rinse BID  . clonazePAM  2 mg Per Tube BID  . [START ON 02/10/2014] cloNIDine  0.1 mg Transdermal Weekly  . [START ON 02/10/2014] fentaNYL  75 mcg Transdermal Q72H  . ferrous sulfate  300 mg Per Tube BID WC  . folic acid  1 mg Per Tube Daily  . free water  150 mL Per Tube TID WC & HS  . ipratropium-albuterol  3 mL Nebulization Q6H  . metoprolol tartrate  25 mg Per Tube BID  . multivitamin  5 mL Per Tube Daily  . neomycin-bacitracin-polymyxin  1 application Topical Daily  . nicotine  21 mg Transdermal Daily  . pantoprazole sodium  40  mg Per Tube Daily  . sodium chloride  10-40 mL Intracatheter Q12H  . thiamine  100 mg Per Tube Daily    Continuous Infusions: . feeding supplement (OSMOLITE 1.2 CAL) 1,000 mL (02/08/14 2100)    Past Medical History  Diagnosis Date  . Alcohol abuse   . Depression   . Anxiety   . Pneumonia     Past Surgical History  Procedure Laterality Date  . Mouth surgery      teeth removed.  . Tracheostomy  01/30/14    feinstein  . Esophagogastroduodenoscopy N/A 02/03/2014    Procedure: ESOPHAGOGASTRODUODENOSCOPY (EGD);  Surgeon: Ladene Artist, MD;  Location: [facility] ENDOSCOPY;  Service: Endoscopy;  Laterality: N/A;  bedside/trach/vent  . Peg placement N/A 02/03/2014    Procedure: PERCUTANEOUS ENDOSCOPIC GASTROSTOMY (PEG) PLACEMENT;  Surgeon: Ladene Artist, MD;  Location: [facility] ENDOSCOPY;  Service: Endoscopy;  Laterality: N/A;    Kallie Locks, MS, RD, LDN Pager # [phone] After hours/ weekend pager # [phone]

## 2014-02-10 ENCOUNTER — Inpatient Hospital Stay (HOSPITAL_COMMUNITY): Payer: Medicaid Other | Admitting: *Deleted

## 2014-02-10 ENCOUNTER — Encounter (HOSPITAL_COMMUNITY): Payer: Medicaid Other | Admitting: Occupational Therapy

## 2014-02-10 ENCOUNTER — Inpatient Hospital Stay (HOSPITAL_COMMUNITY): Payer: Medicaid Other

## 2014-02-10 ENCOUNTER — Inpatient Hospital Stay (HOSPITAL_COMMUNITY): Payer: Medicaid Other | Admitting: Speech Pathology

## 2014-02-10 ENCOUNTER — Inpatient Hospital Stay (HOSPITAL_COMMUNITY): Payer: Medicaid Other | Admitting: Occupational Therapy

## 2014-02-10 LAB — BASIC METABOLIC PANEL
Anion gap: 6 (ref 5–15)
BUN: 23 mg/dL (ref 6–23)
CO2: 33 mmol/L — ABNORMAL HIGH (ref 19–32)
Calcium: 9 mg/dL (ref 8.4–10.5)
Chloride: 105 mEq/L (ref 96–112)
Creatinine, Ser: 0.85 mg/dL (ref 0.50–1.35)
GFR calc Af Amer: >90 mL/min (ref >90)
GLUCOSE: 123 mg/dL — AB (ref 70–99)
POTASSIUM: 4.3 mmol/L (ref 3.5–5.1)
Sodium: 144 mmol/L (ref 135–145)

## 2014-02-10 LAB — CBC WITH DIFFERENTIAL/PLATELET
Basophils Absolute: 0.1 K/uL (ref 0.0–0.1)
Basophils Relative: 0 % (ref 0–1)
Eosinophils Absolute: 1 K/uL — ABNORMAL HIGH (ref 0.0–0.7)
Eosinophils Relative: 6 % — ABNORMAL HIGH (ref 0–5)
HCT: 22.9 % — ABNORMAL LOW (ref 39.0–52.0)
Hemoglobin: 7.3 g/dL — ABNORMAL LOW (ref 13.0–17.0)
Lymphocytes Relative: 14 % (ref 12–46)
Lymphs Abs: 2.4 K/uL (ref 0.7–4.0)
MCH: 32.7 pg (ref 26.0–34.0)
MCHC: 31.9 g/dL (ref 30.0–36.0)
MCV: 102.7 fL — ABNORMAL HIGH (ref 78.0–100.0)
Monocytes Absolute: 1.5 K/uL — ABNORMAL HIGH (ref 0.1–1.0)
Monocytes Relative: 9 % (ref 3–12)
Neutro Abs: 12.4 K/uL — ABNORMAL HIGH (ref 1.7–7.7)
Neutrophils Relative %: 71 % (ref 43–77)
Platelets: 488 K/uL — ABNORMAL HIGH (ref 150–400)
RBC: 2.23 MIL/uL — ABNORMAL LOW (ref 4.22–5.81)
RDW: 15.1 % (ref 11.5–15.5)
WBC: 17.3 K/uL — ABNORMAL HIGH (ref 4.0–10.5)

## 2014-02-10 MED ORDER — LIDOCAINE HCL (PF) 1 % IJ SOLN
INTRAMUSCULAR | Status: AC
  Filled 2014-02-10: qty 10

## 2014-02-10 MED ORDER — SODIUM CHLORIDE 0.9 % IJ SOLN: 10.0000 mL | INTRAMUSCULAR | Status: DC | PRN

## 2014-02-10 NOTE — Progress Notes (Signed)
Patient ID: Allen Wood, male   DOB: 11-26-1970, 44 y.o.   MRN: [id number]    [place]     PROGRESS NOTE   02/10/14.  44 y/o admit for CIR with functional deficits secondary to Critical Illness myopathy.    Subjective/Complaints:  Comfortable night  Past Medical History  Diagnosis Date  . Alcohol abuse   . Depression   . Anxiety   . Pneumonia     Patient Vitals for the past 24 hrs:  BP Temp Temp src Pulse Resp SpO2 Weight  02/10/14 0611 112/74 mmHg 99.6 F (37.6 C) Oral (!) 102 18 94 % -  02/10/14 0500 - - - - - - 73.3 kg (161 lb 9.6 oz)  02/10/14 0450 - - - - - 92 % -  02/10/14 0405 - - - (!) 107 18 90 % -  02/10/14 0034 - - - (!) 101 20 94 % -  02/09/14 2123 - - - (!) 101 18 91 % -  02/09/14 2114 - - - (!) 103 20 92 % -  02/09/14 1658 - - - (!) 111 20 93 % -  02/09/14 1543 - - - (!) 109 - 95 % -  02/09/14 1514 126/81 mmHg 99.6 F (37.6 C) Oral (!) 101 18 97 % -  02/09/14 1229 - - - 98 20 92 % -     Intake/Output Summary (Last 24 hours) at 02/10/14 1040 Last data filed at 02/10/14 0546  Gross per 24 hour  Intake     20 ml  Output    200 ml  Net   -180 ml     Objective: Vital Signs: Blood pressure 112/74, pulse 102, temperature 99.6 F (37.6 C), temperature source Oral, resp. rate 18, height 5\' 11"  (1.803 m), weight 73.3 kg (161 lb 9.6 oz), SpO2 94 %. Dg Chest 2 View  02/10/2014   CLINICAL DATA:  Initial encounter for left-sided pleural effusion.  EXAM: CHEST  2 VIEW  COMPARISON:  1 day prior  FINDINGS: Left internal jugular line terminates at the low SVC. Tracheostomy appropriately positioned.  Moderate left and small right pleural effusions are not significantly changed. No pneumothorax. Interstitial edema is mild and slightly decreased. Left greater than right lower lobe predominant airspace disease is similar.  IMPRESSION: Minimal improvement in congestive heart failure.  Otherwise, similar appearance of left greater  than right pleural effusions and adjacent airspace disease. This could represent atelectasis or concurrent infection.   Electronically Signed   By: Abigail Miyamoto M.D.   On: 02/10/2014 09:10   Dg Chest 2 View  02/09/2014   CLINICAL DATA:  Cough and congestion.  Tracheostomy.  EXAM: CHEST  2 VIEW  COMPARISON:  02/04/2014.  FINDINGS: Tracheostomy tube and left IJ line in stable position. Interval removal of PICC line. Cardiomegaly with mild pulmonary venous congestion. Bilateral pulmonary alveolar infiltrates. Bilateral pleural effusions are noted. Large pleural effusion on the left is new from prior exam. Right pleural effusion is slightly smaller on today's exam . These findings all may be secondary to congestive heart failure. Bilateral pneumonia cannot be excluded. No pneumothorax.  IMPRESSION: 1. Interim removal of PICC line. Tracheostomy tube and left IJ line in stable position. 2. Cardiomegaly with pulmonary venous congestion bilateral pulmonary alveolar infiltrates and pleural effusions suggesting congestive heart failure. Bilateral pneumonia cannot be excluded. 3. The left pleural effusion is a new finding on today's exam. Left pleural effusion is large. Right pleural effusion persists. The  right pleural effusion is slightly smaller on today's exam.   Electronically Signed   By: [place]   On: 02/09/2014 14:03   Dg Swallowing Func-speech Pathology  02/09/2014   Mora Appl, CCC-SLP     02/09/2014  9:17 PM   Objective Swallowing Evaluation: Modified Barium Swallowing Study   Patient Details  Name: Allen Wood MRN: [id number] Date of Birth: 23-Oct-1970  Today's Date: 02/09/2014 Time: 10-1543 30 minutes   Past Medical History:  Past Medical History  Diagnosis Date  . Alcohol abuse   . Depression   . Anxiety   . Pneumonia    Past Surgical History:  Past Surgical History  Procedure Laterality Date  . Mouth surgery      teeth removed.  . Tracheostomy  01/30/14    feinstein  .  Esophagogastroduodenoscopy N/A 02/03/2014    Procedure: ESOPHAGOGASTRODUODENOSCOPY (EGD);  Surgeon: Ladene Artist, MD;  Location: [facility] ENDOSCOPY;  Service: Endoscopy;   Laterality: N/A;  bedside/trach/vent  . Peg placement N/A 02/03/2014    Procedure: PERCUTANEOUS ENDOSCOPIC GASTROSTOMY (PEG) PLACEMENT;   Surgeon: Ladene Artist, MD;  Location: [facility] ENDOSCOPY;  Service:  Endoscopy;  Laterality: N/A;   HPI:  44 y/o M, smoker / ETOH abuse, admitted 12/21 with ongoing  fatigue, cough with productive yellow sputum, R posterior back  pain, chills & sweats. Work up found patient to have RLL PNA  with positive strep antigen. Developed worsening respiratory  failure 12/22 with intubation, trach 1/5, thoracotomy 1/10, off  CRRT 1/9.  Currently with #8 cuffed Shiley; trach collar.      Recommendation/Prognosis  Clinical Impression:   Dysphagia Diagnosis: Mild oral phase dysphagia;Mild pharyngeal  phase dysphagia Clinical impression: Patient demonstrates a mild oral and  pharyngeal phase sensory-motor dysphagia complicated by  respiratory status.  Oral phase deficits are characterized by  mild weakness resulting in prolonged mastication of regular  textures. Pharyngeal phase deficits are characterized by  decreased base of tongue retraction, laryngeal elevation and  epiglottic inversion resulting in silent penetration with large  boluses or consecutive sips of thin consistencies.  Patient also  demonstrates moderate vallecular residue with all consistencies  that was reduced with multiple swallows. However, due to  patient's poor recall and impulsivity he requires Max-Total  assist multimodal cues to recall and utilize safe swallow  strategies.  Recommend patient initiate a more conservative diet  given respiratory status; Dys.2 textures and nectar-thick liquids  with full staff assist and skilled SLP follow up.    Swallow Evaluation Recommendations:  Recommended Consults: MBS Diet Recommendations: Dysphagia 2 (Fine  chop);Nectar-thick liquid Liquid Administration via: Cup;No straw Medication Administration: Whole meds with puree (or via PEG) Supervision: Patient able to self feed;Full supervision/cueing  for compensatory strategies Compensations: Slow rate;Small sips/bites;Multiple dry swallows  after each bite/sip (slow pace) Postural Changes and/or Swallow Maneuvers: Out of bed for  meals;Seated upright 90 degrees Oral Care Recommendations: Oral care prior to ice chips;Oral care  BID Other Recommendations: Order thickener from pharmacy;Prohibited  food (jello, ice cream, thin soups);Have oral suction  available;Remove water pitcher Follow up Recommendations: 24 hour supervision/assistance;Home  health SLP;Outpatient SLP    Prognosis:  Prognosis for Safe Diet Advancement: Good Barriers to Reach Goals: Cognitive deficits   Individuals Consulted: Consulted and Agree with Results and  Recommendations: Patient;PA;RN      SLP Assessment/Plan  Plan:  Potential to Achieve Goals (ACUTE ONLY): Good Potential Considerations (ACUTE ONLY): Ability to learn/carryover  information;Co-morbidities   Short Term Goals:  Week 1: SLP Short Term Goal 1 (Week 1): Pt will  consume Dys.2 textures and nectar-thick liquids with Mod cues to  recall and utilize safe swallow strategies to minimze overt s/s  of aspiration SLP Short Term Goal 2 (Week 1): Pt will demonstrate ability to  donn and doff PMSV with Mod I  SLP Short Term Goal 3 (Week 1): Pt will tolerate PMSV during all  waking hours with with intermittent supervision  SLP Short Term Goal 4 (Week 1): Pt will request help as needed  with Mod question cues  SLP Short Term Goal 5 (Week 1): Pt will solve basic problems  realted to safe self-care with Min verbal cues    General: Type of Study: Modified Barium Swallowing Study Reason for Referral: Objectively evaluate swallowing function Previous Swallow Assessment: BSE 02/08/14 Diet Prior to this Study: NPO;PEG tube;Other (Comment) (ice chips  afer oral  care) Temperature Spikes Noted: No Respiratory Status: Trach Trach Size and Type: #8;Cuff;Deflated;With PMSV in place History of Recent Intubation: Yes Length of Intubations (days): 13 days Date extubated: 01/29/14 Behavior/Cognition: Alert;Cooperative;Pleasant mood;Requires  cueing;Doesn't follow directions Oral Cavity - Dentition: Adequate natural dentition Oral Motor / Sensory Function: Within functional limits Self-Feeding Abilities: Able to feed self;Needs assist Patient Positioning: Upright in chair Baseline Vocal Quality: Clear;Low vocal intensity;Breathy Volitional Cough: Strong Volitional Swallow: Able to elicit Anatomy: Within functional limits Pharyngeal Secretions:  (coated with barium in pharynx)   Reason for Referral:   Objectively evaluate swallowing function    Oral Phase: Oral Preparation/Oral Phase Oral Phase: Impaired Oral - Solids Oral - Puree: Within functional limits Oral - Regular: Other (Comment) (prolonged mastication and due to  breath support recommend softer solids)   Pharyngeal Phase:  Pharyngeal Phase Pharyngeal Phase: Impaired Pharyngeal - Nectar Pharyngeal - Nectar Cup: Compensatory strategies attempted  (Comment);Reduced tongue base retraction;Pharyngeal residue -  valleculae;Reduced epiglottic inversion (cues for small sips and  a slow pace due to work of breathing ) Penetration/Aspiration details (nectar cup): Material does not  enter airway Pharyngeal - Thin Pharyngeal - Thin Cup: Reduced tongue base retraction;Pharyngeal  residue - valleculae;Penetration/Aspiration after  swallow;Compensatory strategies attempted (Comment);Reduced  epiglottic inversion (cues for cough cleared penetrates) Penetration/Aspiration details (thin cup): Material enters  airway, remains ABOVE vocal cords and not ejected out Pharyngeal - Thin Straw: Reduced epiglottic inversion;Reduced  tongue base retraction;Penetration/Aspiration after  swallow;Pharyngeal residue - valleculae;Compensatory strategies   attempted (Comment) (cues for cough cleared penetrates) Penetration/Aspiration details (thin straw): Material enters  airway, remains ABOVE vocal cords and not ejected out Pharyngeal - Solids Pharyngeal - Puree: Reduced epiglottic inversion;Reduced tongue  base retraction;Pharyngeal residue - valleculae;Compensatory  strategies attempted (Comment) (cues for multiple swallows  cleared residue ) Pharyngeal - Regular: Reduced epiglottic inversion;Reduced tongue  base retraction;Pharyngeal residue - valleculae;Compensatory  strategies attempted (Comment) (cues for multiple swallows  cleared residue )   Cervical Esophageal Phase  Cervical Esophageal Phase Cervical Esophageal Phase: [facility]   GN         Gunnar Fusi, M.A., CCC-SLP [phone]  BOWIE,MELISSA 02/09/2014, 9:16 PM                     Recent Labs  02/09/14 0728 02/10/14 0902  WBC 19.7* 17.3*  HGB 7.2* 7.3*  HCT 22.6* 22.9*  PLT 498* 488*    Recent Labs  02/09/14 0728 02/10/14 0902  NA 145 144  K 4.1 4.3  CL 103 105  GLUCOSE 99 123*  BUN 28* 23  CREATININE 0.81 0.85  CALCIUM 9.1 9.0   CBG (last 3)  No results for input(s): GLUCAP in the last 72 hours.  Wt Readings from Last 3 Encounters:  02/10/14 73.3 kg (161 lb 9.6 oz)  02/03/14 75.2 kg (165 lb 12.6 oz)  09/14/13 65.726 kg (144 lb 14.4 oz)    Physical Exam:  Constitutional: He appears well-developed. He has a sickly appearance.   HENT:  Head: Normocephalic and atraumatic.    Eyes: Conjunctivae are normal. Pupils are equal, round, and reactive to light.  Neck:  # 8 cuffed trach in place with frequent   secretions. Diateck cath left neck.  Cardiovascular: Regular rhythm. Tachycardia present.  HR 100's  Respiratory: Effort normal. He has diffuse rhonchi.  Occasional productive coughing  GI: Normal appearance and bowel sounds are normal. Distention: mild distension. There is tenderness.  PEG site tender but clean and dry.  Genitourinary:  Foley  in place.   Musculoskeletal: He exhibits no edema or tenderness.  Neurological: He is alert.  Able to phonate occasionally. Moves all four.distracted.  Motor strength is 3+ to 4-/5 bilateral deltoid,, bicep tricep, 5 minus grip, 3/5 hip flexors 4 minus knee extensors 4+ ankle dorsiflexors Withdraws to pain on all 4's Psych: restless   Medical Problem List and Plan: 1. Functional deficits secondary to Critical Illness myopathy.  2. DVT Prophylaxis/Anticoagulation: Mechanical: Sequential compression devices, below knee Bilateral lower extremities 3. Pain Management: Continue fentanyl 75 mcg/hr and wean over the next few days.  4. Anxiety disorder/Mood: Was on Wellbutrin and Zoloft at home. Continue Klonopin bid for now. May need to resume zoloft. LCSW to follow for evaluation and support.  5. Neuropsych: This patient is capable of making decisions on his own behalf. 6. Skin/Wound Care: Routine pressure relief measures. Maintain adequate nutritional and hydration status.  7. Fluids/Electrolytes/Nutrition: Continue tube feeds. Add water boluses for hydration.  8. Dysphagia: Continue TF for now. Question FEES soon as able to tolerate PMSV with good breath support.  9. VDRF: Continue nebs every 6 hours to help mobilize thick secretions.  Unchanged pleural effusions 10. Resting Tachycardia: Due to deconditioning--on metoprolol 25 mg bid to help maintain HR < 115 bpm  11. Anemia: Will need colonoscopy on outpatient basis. Monitor H/H with transfusion prn drop in Hgb <7.0.  12. Acute renal failure: Resolving with stable BUN/Cr  13. Fever: re-check cxr today- little change in bilateral  effusions c/t 02/09/14 LOS (Days) 2 A FACE TO FACE EVALUATION WAS PERFORMED  Nyoka Cowden 02/10/2014 10:39 AM

## 2014-02-10 NOTE — IPOC Note (Signed)
Overall Plan of Care [facility]) Patient Details Name: Allen Wood MRN: [id number] DOB: 1970-08-07  Admitting Diagnosis: critical illness myopathy  Hospital Problems: Principal Problem:   Critical illness myopathy Active Problems:   EtOH dependence   Dysphagia, oropharyngeal phase   Status post tracheostomy   Status post gastrostomy     Functional Problem List: Nursing Bladder, Bowel, Endurance, Medication Management, Nutrition, Safety  PT Balance, Behavior, Endurance, Nutrition, Safety  OT Balance, Cognition, Endurance, Motor, Pain, Safety  SLP Behavior, Cognition, Linguistic, Nutrition, Safety  TR         Basic ADL's: OT Grooming, Bathing, Dressing, Toileting     Advanced  ADL's: OT       Transfers: PT Bed Mobility, Bed to Chair, Car, Manufacturing systems engineer, Metallurgist: PT Ambulation, Stairs     Additional Impairments: OT None  SLP Swallowing, Communication, Social Cognition expression Social Interaction, Problem Solving, Memory, Attention, Awareness  TR      Anticipated Outcomes Item Anticipated Outcome  Self Feeding To be determined based on swallowing status  Swallowing  Supervision    Basic self-care  set up with dressing, supervision with bathing  Toileting  mod I   Bathroom Transfers mod I to toilet, supervision with tub bench transfer  Bowel/Bladder  mod indep  Transfers  Mod I  Locomotion  Supervision with LRAD  Communication  Supervision   Cognition  Min assist   Pain  pain < #2  Safety/Judgment  mod I   Therapy Plan: PT Intensity: Minimum of 1-2 x/day ,45 to 90 minutes PT Frequency: 5 out of 7 days PT Duration Estimated Length of Stay: 12-14 OT Intensity: Minimum of 1-2 x/day, 45 to 90 minutes OT Frequency: 5 out of 7 days OT Duration/Estimated Length of Stay: 12-14 days SLP Intensity: Minumum of 1-2 x/day, 30 to 90 minutes SLP Frequency: 5 out of 7 days SLP Duration/Estimated Length of Stay: 14-16 days    Team Interventions: Nursing Interventions Patient/Family Education, Bowel Management, Bladder Management, Disease Management/Prevention, Pain Management, Medication Management, Skin Care/Wound Management, Dysphagia/Aspiration Precaution Training, Discharge Planning, Psychosocial Support  PT interventions Ambulation/gait training, Training and development officer, Cognitive remediation/compensation, Community reintegration, Discharge planning, Disease management/prevention, DME/adaptive equipment instruction, Functional mobility training, Neuromuscular re-education, Pain management, Patient/family education, Psychosocial support, Skin care/wound management, Stair training, Therapeutic Activities, Therapeutic Exercise, UE/LE Strength taining/ROM, Wheelchair propulsion/positioning  OT Interventions Cognitive remediation/compensation, Discharge planning, DME/adaptive equipment instruction, Functional mobility training, Patient/family education, Self Care/advanced ADL retraining, Therapeutic Activities, Therapeutic Exercise, UE/LE Strength taining/ROM, Psychosocial support, Pain management, Balance/vestibular training  SLP Interventions Cognitive remediation/compensation, Cueing hierarchy, Dysphagia/aspiration precaution training, Environmental controls, Functional tasks, Internal/external aids, Patient/family education, Speech/Language facilitation, Therapeutic Activities  TR Interventions    SW/CM Interventions Discharge Planning, Psychosocial Support, Patient/Family Education    Team Discharge Planning: Destination: PT-Home ,OT- Home , SLP-Home Projected Follow-up: PT-Home health PT, 24 hour supervision/assistance, OT-  Home health OT, SLP-24 hour supervision/assistance, Home Health SLP, Outpatient SLP Projected Equipment Needs: PT-To be determined, OT- 3 in 1 bedside comode, Tub/shower bench, SLP-To be determined Equipment Details: PT-TBD if pt will need RW at DC, OT-  Patient/family involved in discharge  planning: PT- Family member/caregiver,  OT-Patient, SLP-Patient, Family member/caregiver  MD ELOS: 14-15 days Medical Rehab Prognosis:  Excellent Assessment: The patient has been admitted for CIR therapies with the diagnosis of debility due to respiratory failure and multiple medical issues. The team will be addressing functional mobility, strength, stamina, balance, safety, adaptive techniques and equipment, self-care, bowel and bladder  mgt, patient and caregiver education, breathing techniques, trach care, speech, swallowing, communication, cognition, activity tolerance. Goals have been set at supervision to mod I with mobility, self-care, and supervision swallowing/ min assist with cognition.    Meredith Staggers, MD, FAAPMR      See Team Conference Notes for weekly updates to the plan of care

## 2014-02-10 NOTE — Progress Notes (Signed)
Occupational Therapy Session Note  Patient Details  Name: Allen Wood MRN: [id number] Date of Birth: May 24, 1970  Today's Date: 02/10/2014 OT Individual Time: 0900-1000 OT Individual Time Calculation (min): 60 min    Short Term Goals: Week 1:  OT Short Term Goal 1 (Week 1): Pt will be able to transfer to toilet with close S. OT Short Term Goal 2 (Week 1): Pt will complete LB dressing with steady A. OT Short Term Goal 3 (Week 1): Pt will be able to stand at sink to groom for at least 2 min with no drop in O2 status. OT Short Term Goal 4 (Week 1): Pt will be able to bathe with steady A.  Skilled Therapeutic Interventions/Progress Updates:    1:1 self care retraining sitting EOB. When walked into the room pt trying to open bottle of Saline - reporting he was hungry.  Focus on activity tolerance. Pt declined to bathe and dress but did participate in eating breakfast, grooming and changing his shirt. Focus on sitting tolerance unsupported and PMSV tolerance with light activity. Pt required therapist to remove PMSV 2x in session due to desating to 30s; but once doffed pt able to return to mid to high 90s with trach collar on. The rest of the time he was able to tolerate the PMSV but required more than reasonable amt of time to rest in between activities. Pt required mod cuing to follow swallow precautions. Left in bed with PMSV off.  Therapy Documentation Precautions:  Precautions Precautions: Other (comment), Fall (Trach collar) Precaution Comments: trach, peg, flexiseal Restrictions Weight Bearing Restrictions: No    Vital Signs: Therapy Vitals BP: 123/74 mmHg Pain: reported soreness in stomach region- relief with rest   ADL: ADL ADL Comments: Refer to FIM  See FIM for current functional status  Therapy/Group: Individual Therapy  [name] [facility] 02/10/2014, 12:36 PM

## 2014-02-10 NOTE — Plan of Care (Signed)
Problem: RH SAFETY Goal: RH STG ADHERE TO SAFETY PRECAUTIONS W/ASSISTANCE/DEVICE STG Adhere to Safety Precautions With Assistance/Device. Min assist  Outcome: Not Progressing Requires frequent cues to verbalize safety needs

## 2014-02-10 NOTE — Progress Notes (Signed)
Physical Therapy Session Note  Patient Details  Name: Allen Wood MRN: [id number] Date of Birth: [birth year]  Today's Date: 02/10/2014 PT Individual Time: 0755-0855 PT Individual Time Calculation (min): 60 min    Skilled Therapeutic Interventions/Progress Updates:  Patient in bed agrees to therapy no complains of pain, reports fatigue during session, increased rest breaks needed. In supine patient performed LE exercises; SLR 1 x 15 Hip/knee flexion against resistance 1 x10.  Training in bed mobility to assist in dressing, turning and coming to sit with min A, bridging with VC.  Dynamic sitting EOb to increase balance, transfer to w/c with min guard. Patient wanted to use the restroom and with min A able to transfer and complete his task, hand washing in standing with min A for balance,and cues for posture. W/C propulsion using B LE to and from gym with min VC for path following.  Gait training with FWW 2 x 60 feet with min A. Patient maintained good saturation through the session. Returned to bed with alarm on and all needs within reach.  Therapy Documentation Precautions:  Precautions Precautions: Other (comment), Fall (Trach collar) Precaution Comments: trach, peg, flexiseal Restrictions Weight Bearing Restrictions: No Vital Signs: Therapy Vitals Temp: 99.6 F (37.6 C) Temp Source: Oral Pulse Rate: (!) 102 Resp: 18 BP: 112/74 mmHg Patient Position (if appropriate): Lying Oxygen Therapy SpO2: 94 % O2 Device: Tracheostomy Collar O2 Flow Rate (L/min): 8 L/min FiO2 (%): 40 %   See FIM for current functional status  Therapy/Group: Individual Therapy  Guadlupe Spanish 02/10/2014, 8:55 AM

## 2014-02-10 NOTE — Procedures (Signed)
US guided therapeutic left thoracentesis performed yielding 1.6 liters slightly turbid, yellow fluid. F/u CXR pending. No immediate complications.

## 2014-02-11 ENCOUNTER — Encounter (HOSPITAL_COMMUNITY): Payer: Self-pay

## 2014-02-11 ENCOUNTER — Inpatient Hospital Stay (HOSPITAL_COMMUNITY): Payer: Medicaid Other

## 2014-02-11 DIAGNOSIS — Z930 Tracheostomy status: Secondary | ICD-10-CM

## 2014-02-11 DIAGNOSIS — J90 Pleural effusion, not elsewhere classified: Secondary | ICD-10-CM

## 2014-02-11 LAB — CULTURE, RESPIRATORY W GRAM STAIN: Gram Stain: NONE SEEN

## 2014-02-11 LAB — URINE CULTURE
CULTURE: NO GROWTH
Colony Count: NO GROWTH

## 2014-02-11 LAB — CULTURE, RESPIRATORY

## 2014-02-11 NOTE — Consult Note (Addendum)
Name: Allen Wood MRN: [id number] DOB: [birth year]    ADMISSION DATE:  02/08/2014 CONSULTATION DATE:  02/11/2014  REFERRING MD :  [facility]  CHIEF COMPLAINT:   Pleural effusions, dyspnea  History of present illness: 44 year old male with alcohol and tobacco use with previous left lower lobe pneumonia August 2015 then admitted 01/15/2014 with right lower lobe pneumonia secondary to pneumococcal pneumonia with bacteremia. The patient had respiratory failure. The patient had significant seizures and was loaded with Keppra. The patient had significant critical illness myopathy and neuropathy. The patient was subsequently trached and had chronic vent support with chronic vent weaning. The patient is now on acute rehabilitation for his deconditioning and critical illness myopathy.  Recurrent pleural effusions have developed including a thoracentesis prior to transfer to rehabilitation on 02/04/2014 and then yet another thoracentesis both on the left 02/10/2014. The fluid is exudative in nature. The patient has completed his courses of antibiotics.  Note patient also had acute renal failure as part of his symptom complex  Pulmonary has been reconsult for pleural effusions and dyspnea.  SIGNIFICANT EVENTS  Repeat thoracentesis 02/10/2014 revealing 1.9 L of fluid from the left pleural space.  STUDIES CT chest 02/11/2014:  PAST MEDICAL HISTORY :   has a past medical history of Alcohol abuse; Depression; Anxiety; and Pneumonia.  has past surgical history that includes Mouth surgery; Tracheostomy (01/30/14); Esophagogastroduodenoscopy (N/A, 02/03/2014); and PEG placement (N/A, 02/03/2014). Prior to Admission medications   Medication Sig Start Date End Date Taking? Authorizing Provider  azithromycin (ZITHROMAX) 500 MG tablet Take 1 tablet (500 mg total) by mouth daily. 09/15/13  Yes [name], MD  buPROPion (WELLBUTRIN XL) 150 MG 24 hr tablet Take 150 mg by mouth every morning.   Yes  Historical Provider, MD  predniSONE (DELTASONE) 10 MG tablet Takes 6 tablets for 1 days, then 5 tablets for 1 days, then 4 tablets for 1 days, then 3 tablets for 1 days, then 2 tabs for 1 days, then 1 tab for 1 days, and then stop. 09/15/13  Yes [name], MD  sertraline (ZOLOFT) 50 MG tablet Take 50 mg by mouth at bedtime.   Yes Historical Provider, MD   No Known Allergies  FAMILY HISTORY:  family history includes Cancer in his father and mother. SOCIAL HISTORY:  reports that he has been smoking Cigarettes.  He has a 10 pack-year smoking history. He has never used smokeless tobacco. He reports that he drinks alcohol. He reports that he does not use illicit drugs.  REVIEW OF SYSTEMS:   Pos in BOLD Constitutional: Negative for fever, chills, weight loss, malaise/fatigue and diaphoresis.  HENT: Negative for hearing loss, ear pain, nosebleeds, congestion, sore throat, neck pain, tinnitus and ear discharge.   Eyes: Negative for blurred vision, double vision, photophobia, pain, discharge and redness.  Respiratory: Negative for cough, hemoptysis, sputum production, shortness of breath, wheezing and stridor.   Cardiovascular: Negative for chest pain, palpitations, orthopnea, claudication, leg swelling and PND.  Gastrointestinal: Negative for heartburn, nausea, vomiting, abdominal pain, diarrhea, constipation, blood in stool and melena.  Genitourinary: Negative for dysuria, urgency, frequency, hematuria and flank pain.  Musculoskeletal: Negative for myalgias, back pain, joint pain and falls.  Skin: Negative for itching and rash.  Neurological: Negative for dizziness, tingling, tremors, sensory change, speech change,  weakness, seizures, loss of consciousness,and headaches.  Endo/Heme/Allergies: Negative for environmental allergies and polydipsia. Does not bruise/bleed easily.  SUBJECTIVE:   VITAL SIGNS: Temp:  [98.8 F (37.1 C)-99.4 F (37.4  C)] 99.2 F (37.3 C) (01/17 0802) Pulse  Rate:  [93-104] 96 (01/17 1115) Resp:  [14-18] 14 (01/17 1115) BP: (110-120)/(77-78) 110/78 mmHg (01/17 0802) SpO2:  [91 %-100 %] 96 % (01/17 1115) FiO2 (%):  [35 %-40 %] 35 % (01/17 1115)  PHYSICAL EXAMINATION: General:  Ill-appearing white male in no distress tracheostomy in place Neuro:  Global weakness noted HEENT:  Tracheostomy in place Cardiovascular:  Regular rate and rhythm normal S1-S2 no S3 or S4 Lungs:  Bilateral rhonchi decreased breath sounds in the left one quarter the way up Abdomen:  PEG tube in place soft nontender Musculoskeletal:  Full range of motion no joint deformity Skin:  Clear   Recent Labs Lab 02/06/14 0445 02/09/14 0728 02/10/14 0902  NA 138 145 144  K 3.6 4.1 4.3  CL 104 103 105  CO2 27 30 33*  BUN 36* 28* 23  CREATININE 1.16 0.81 0.85  GLUCOSE 103* 99 123*    Recent Labs Lab 02/06/14 0445 02/09/14 0728 02/10/14 0902  HGB 7.8* 7.2* 7.3*  HCT 23.3* 22.6* 22.9*  WBC 16.3* 19.7* 17.3*  PLT 686* 498* 488*   Dg Chest 1 View  02/10/2014   CLINICAL DATA:  Status post left thoracentesis.  EXAM: CHEST - 1 VIEW  COMPARISON:  02/09/2014.  FINDINGS: Little change in a large left pleural effusion. No pneumothorax. More discrete nodular density in the right mid lung zone, measuring 3.7 x 2.0 cm on the frontal view that had a more linear appearance with lucency previously, without the appearance of a discrete mass yesterday. No significant change in a small right pleural effusion and linear density at the right lung base. Diffuse osteopenia. Tracheostomy tube in satisfactory position. Stable left jugular catheter.  IMPRESSION: 1. No pneumothorax following left thoracentesis. 2. No significant change in bilateral pleural effusions. 3. Interval 3.7 x 2.0 cm mass like density in the right mid lung zone. This may represent loculated fluid in a fissure. A previously partially obscured mass is also a possibility. 4. No significant change in mild right basilar  atelectasis.   Electronically Signed   By: Enrique Sack M.D.   On: 02/10/2014 13:43   Dg Chest 2 View  02/10/2014   CLINICAL DATA:  Initial encounter for left-sided pleural effusion.  EXAM: CHEST  2 VIEW  COMPARISON:  1 day prior  FINDINGS: Left internal jugular line terminates at the low SVC. Tracheostomy appropriately positioned.  Moderate left and small right pleural effusions are not significantly changed. No pneumothorax. Interstitial edema is mild and slightly decreased. Left greater than right lower lobe predominant airspace disease is similar.  IMPRESSION: Minimal improvement in congestive heart failure.  Otherwise, similar appearance of left greater than right pleural effusions and adjacent airspace disease. This could represent atelectasis or concurrent infection.   Electronically Signed   By: Abigail Miyamoto M.D.   On: 02/10/2014 09:10   Dg Chest 2 View  02/09/2014   CLINICAL DATA:  Cough and congestion.  Tracheostomy.  EXAM: CHEST  2 VIEW  COMPARISON:  02/04/2014.  FINDINGS: Tracheostomy tube and left IJ line in stable position. Interval removal of PICC line. Cardiomegaly with mild pulmonary venous congestion. Bilateral pulmonary alveolar infiltrates. Bilateral pleural effusions are noted. Large pleural effusion on the left is new from prior exam. Right pleural effusion is slightly smaller on today's exam . These findings all may be secondary to congestive heart failure. Bilateral pneumonia cannot be excluded. No pneumothorax.  IMPRESSION: 1. Interim removal of PICC line.  Tracheostomy tube and left IJ line in stable position. 2. Cardiomegaly with pulmonary venous congestion bilateral pulmonary alveolar infiltrates and pleural effusions suggesting congestive heart failure. Bilateral pneumonia cannot be excluded. 3. The left pleural effusion is a new finding on today's exam. Left pleural effusion is large. Right pleural effusion persists. The right pleural effusion is slightly smaller on today's exam.    Electronically Signed   By: [employer]   On: 02/09/2014 14:03   Dg Swallowing Func-speech Pathology  02/09/2014   Mora Appl, CCC-SLP     02/09/2014  9:17 PM   Objective Swallowing Evaluation: Modified Barium Swallowing Study   Patient Details  Name: Allen Wood MRN: [id number] Date of Birth: 11-20-1970  Today's Date: 02/09/2014 Time: 54-1545 30 minutes   Past Medical History:  Past Medical History  Diagnosis Date  . Alcohol abuse   . Depression   . Anxiety   . Pneumonia    Past Surgical History:  Past Surgical History  Procedure Laterality Date  . Mouth surgery      teeth removed.  . Tracheostomy  01/30/14    feinstein  . Esophagogastroduodenoscopy N/A 02/03/2014    Procedure: ESOPHAGOGASTRODUODENOSCOPY (EGD);  Surgeon: Ladene Artist, MD;  Location: [facility] ENDOSCOPY;  Service: Endoscopy;   Laterality: N/A;  bedside/trach/vent  . Peg placement N/A 02/03/2014    Procedure: PERCUTANEOUS ENDOSCOPIC GASTROSTOMY (PEG) PLACEMENT;   Surgeon: Ladene Artist, MD;  Location: [facility] ENDOSCOPY;  Service:  Endoscopy;  Laterality: N/A;   HPI:  44 y/o M, smoker / ETOH abuse, admitted 12/21 with ongoing  fatigue, cough with productive yellow sputum, R posterior back  pain, chills & sweats. Work up found patient to have RLL PNA  with positive strep antigen. Developed worsening respiratory  failure 12/22 with intubation, trach 1/5, thoracotomy 1/10, off  CRRT 1/9.  Currently with #8 cuffed Shiley; trach collar.      Recommendation/Prognosis  Clinical Impression:   Dysphagia Diagnosis: Mild oral phase dysphagia;Mild pharyngeal  phase dysphagia Clinical impression: Patient demonstrates a mild oral and  pharyngeal phase sensory-motor dysphagia complicated by  respiratory status.  Oral phase deficits are characterized by  mild weakness resulting in prolonged mastication of regular  textures. Pharyngeal phase deficits are characterized by  decreased base of tongue retraction, laryngeal elevation and  epiglottic inversion resulting  in silent penetration with large  boluses or consecutive sips of thin consistencies.  Patient also  demonstrates moderate vallecular residue with all consistencies  that was reduced with multiple swallows. However, due to  patient's poor recall and impulsivity he requires Max-Total  assist multimodal cues to recall and utilize safe swallow  strategies.  Recommend patient initiate a more conservative diet  given respiratory status; Dys.2 textures and nectar-thick liquids  with full staff assist and skilled SLP follow up.    Swallow Evaluation Recommendations:  Recommended Consults: MBS Diet Recommendations: Dysphagia 2 (Fine chop);Nectar-thick liquid Liquid Administration via: Cup;No straw Medication Administration: Whole meds with puree (or via PEG) Supervision: Patient able to self feed;Full supervision/cueing  for compensatory strategies Compensations: Slow rate;Small sips/bites;Multiple dry swallows  after each bite/sip (slow pace) Postural Changes and/or Swallow Maneuvers: Out of bed for  meals;Seated upright 90 degrees Oral Care Recommendations: Oral care prior to ice chips;Oral care  BID Other Recommendations: Order thickener from pharmacy;Prohibited  food (jello, ice cream, thin soups);Have oral suction  available;Remove water pitcher Follow up Recommendations: 24 hour supervision/assistance;Home  health SLP;Outpatient SLP    Prognosis:  Prognosis for Safe  Diet Advancement: Good Barriers to Reach Goals: Cognitive deficits   Individuals Consulted: Consulted and Agree with Results and  Recommendations: Patient;PA;RN      SLP Assessment/Plan  Plan:  Potential to Achieve Goals (ACUTE ONLY): Good Potential Considerations (ACUTE ONLY): Ability to learn/carryover  information;Co-morbidities   Short Term Goals: Week 1: SLP Short Term Goal 1 (Week 1): Pt will  consume Dys.2 textures and nectar-thick liquids with Mod cues to  recall and utilize safe swallow strategies to minimze overt s/s  of aspiration SLP Short Term  Goal 2 (Week 1): Pt will demonstrate ability to  donn and doff PMSV with Mod I  SLP Short Term Goal 3 (Week 1): Pt will tolerate PMSV during all  waking hours with with intermittent supervision  SLP Short Term Goal 4 (Week 1): Pt will request help as needed  with Mod question cues  SLP Short Term Goal 5 (Week 1): Pt will solve basic problems  realted to safe self-care with Min verbal cues    General: Type of Study: Modified Barium Swallowing Study Reason for Referral: Objectively evaluate swallowing function Previous Swallow Assessment: BSE 02/08/14 Diet Prior to this Study: NPO;PEG tube;Other (Comment) (ice chips  afer oral care) Temperature Spikes Noted: No Respiratory Status: Trach Trach Size and Type: #8;Cuff;Deflated;With PMSV in place History of Recent Intubation: Yes Length of Intubations (days): 13 days Date extubated: 01/29/14 Behavior/Cognition: Alert;Cooperative;Pleasant mood;Requires  cueing;Doesn't follow directions Oral Cavity - Dentition: Adequate natural dentition Oral Motor / Sensory Function: Within functional limits Self-Feeding Abilities: Able to feed self;Needs assist Patient Positioning: Upright in chair Baseline Vocal Quality: Clear;Low vocal intensity;Breathy Volitional Cough: Strong Volitional Swallow: Able to elicit Anatomy: Within functional limits Pharyngeal Secretions:  (coated with barium in pharynx)   Reason for Referral:   Objectively evaluate swallowing function    Oral Phase: Oral Preparation/Oral Phase Oral Phase: Impaired Oral - Solids Oral - Puree: Within functional limits Oral - Regular: Other (Comment) (prolonged mastication and due to  breath support recommend softer solids)   Pharyngeal Phase:  Pharyngeal Phase Pharyngeal Phase: Impaired Pharyngeal - Nectar Pharyngeal - Nectar Cup: Compensatory strategies attempted  (Comment);Reduced tongue base retraction;Pharyngeal residue -  valleculae;Reduced epiglottic inversion (cues for small sips and  a slow pace due to work of  breathing ) Penetration/Aspiration details (nectar cup): Material does not  enter airway Pharyngeal - Thin Pharyngeal - Thin Cup: Reduced tongue base retraction;Pharyngeal  residue - valleculae;Penetration/Aspiration after  swallow;Compensatory strategies attempted (Comment);Reduced  epiglottic inversion (cues for cough cleared penetrates) Penetration/Aspiration details (thin cup): Material enters  airway, remains ABOVE vocal cords and not ejected out Pharyngeal - Thin Straw: Reduced epiglottic inversion;Reduced  tongue base retraction;Penetration/Aspiration after  swallow;Pharyngeal residue - valleculae;Compensatory strategies  attempted (Comment) (cues for cough cleared penetrates) Penetration/Aspiration details (thin straw): Material enters  airway, remains ABOVE vocal cords and not ejected out Pharyngeal - Solids Pharyngeal - Puree: Reduced epiglottic inversion;Reduced tongue  base retraction;Pharyngeal residue - valleculae;Compensatory  strategies attempted (Comment) (cues for multiple swallows  cleared residue ) Pharyngeal - Regular: Reduced epiglottic inversion;Reduced tongue  base retraction;Pharyngeal residue - valleculae;Compensatory  strategies attempted (Comment) (cues for multiple swallows  cleared residue )   Cervical Esophageal Phase  Cervical Esophageal Phase Cervical Esophageal Phase: [facility]   GN         Gunnar Fusi, M.A., CCC-SLP [phone]  BOWIE,MELISSA 02/09/2014, 9:16 PM                    US Thoracentesis Asp Pleural Space  W/img Guide  02/10/2014   INDICATION: Patient with prior history of pneumonia and bilateral pleural effusions, left greater than right. Request is made for therapeutic left thoracentesis.  EXAM: ULTRASOUND GUIDED THERAPEUTIC LEFT THORACENTESIS.  COMPARISON:  Prior thoracentesis on 02/04/2014  MEDICATIONS: None  COMPLICATIONS: None immediate  TECHNIQUE: Informed written consent was obtained from the patient after a discussion of the risks, benefits and alternatives to  treatment. A timeout was performed prior to the initiation of the procedure.  Initial ultrasound scanning demonstrates a moderate to large left pleural effusion. The lower chest was prepped and draped in the usual sterile fashion. 1% lidocaine was used for local anesthesia.  An ultrasound image was saved for documentation purposes. An 8 Fr Safe-T-Centesis catheter was introduced. The thoracentesis was performed. The catheter was removed and a dressing was applied. The patient tolerated the procedure well without immediate post procedural complication. The patient was escorted to have an upright chest radiograph.  FINDINGS: A total of approximately 1.6 liters of slightly turbid, yellow fluid was removed.  IMPRESSION: Successful ultrasound-guided therapeutic left sided thoracentesis yielding 1.6 liters of pleural fluid.  Read by: Rowe Robert, PA-C   Electronically Signed   By: Markus Daft M.D.   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 In this situation need to rule out chronic empyema state with persistent leukocytosis and low-grade fever. Also need to rule out hospital-acquired infection as well. No respiratory culture is pending at the time of this dictation   Plan   Obtain CT scan the chest to further delineate left pleural space   May yet need left pleural drainage catheter inserted for drainage   No antibiotics for now   Continue pulmonary toilet and nebulized therapy  Glyn Ade  [phone]  Cell  [phone]  If no response or cell goes to voicemail, call beeper [phone]  Pulmonary and [place] Pager: [phone]  02/11/2014, 12:48  PM

## 2014-02-11 NOTE — Progress Notes (Signed)
Patient ID: Allen Wood, male   DOB: 1970/08/27, 44 y.o.   MRN: [id number]  Patient ID: Allen Wood, male   DOB: 09-May-1970, 44 y.o.   MRN: [id number]    [place]     PROGRESS NOTE   02/11/14.  44 y/o admit for CIR with functional deficits secondary to Critical Illness myopathy. Underwent repeat L thoracentesis with removal of 1.6 L of fluid; post procedure CXR reveals RML density. WBC yesterday slightly improved but remains elevated at 17.3   Subjective/Complaints:  Sitting in chair-comfortable P 94; O2 sat 94%. Attempted to call wife for update and left message on VM  Past Medical History  Diagnosis Date  . Alcohol abuse   . Depression   . Anxiety   . Pneumonia     Patient Vitals for the past 24 hrs:  BP Temp Temp src Pulse Resp SpO2  02/11/14 0811 - - - (!) 102 16 94 %  02/11/14 0802 110/78 mmHg 99.2 F (37.3 C) Axillary (!) 103 - -  02/11/14 0541 - 99.4 F (37.4 C) Axillary 93 18 94 %  02/11/14 0329 - - - 94 16 92 %  02/11/14 0051 - - - 93 16 93 %  02/10/14 2014 - - - 94 16 95 %  02/10/14 1503 - - - (!) 104 18 91 %  02/10/14 1454 - - - - - 91 %  02/10/14 1436 120/77 mmHg 98.8 F (37.1 C) Oral 97 18 100 %  02/10/14 1243 99/63 mmHg - - - - 100 %  02/10/14 1238 101/64 mmHg - - - - 98 %  02/10/14 1233 106/66 mmHg - - - - 95 %  02/10/14 1208 128/87 mmHg - - - - 94 %  02/10/14 1108 123/74 mmHg - - - - -     Intake/Output Summary (Last 24 hours) at 02/11/14 0938 Last data filed at 02/11/14 0800  Gross per 24 hour  Intake    550 ml  Output    560 ml  Net    -10 ml     Objective: Vital Signs: Blood pressure 110/78, pulse 102, temperature 99.2 F (37.3 C), temperature source Axillary, resp. rate 16, height 5\' 11"  (1.803 m), weight 73.3 kg (161 lb 9.6 oz), SpO2 94 %. Dg Chest 1 View  02/10/2014   CLINICAL DATA:  Status post left thoracentesis.  EXAM: CHEST - 1 VIEW  COMPARISON:  02/09/2014.  FINDINGS: Little change in  a large left pleural effusion. No pneumothorax. More discrete nodular density in the right mid lung zone, measuring 3.7 x 2.0 cm on the frontal view that had a more linear appearance with lucency previously, without the appearance of a discrete mass yesterday. No significant change in a small right pleural effusion and linear density at the right lung base. Diffuse osteopenia. Tracheostomy tube in satisfactory position. Stable left jugular catheter.  IMPRESSION: 1. No pneumothorax following left thoracentesis. 2. No significant change in bilateral pleural effusions. 3. Interval 3.7 x 2.0 cm mass like density in the right mid lung zone. This may represent loculated fluid in a fissure. A previously partially obscured mass is also a possibility. 4. No significant change in mild right basilar atelectasis.   Electronically Signed   By: Enrique Sack M.D.   On: 02/10/2014 13:43   Dg Chest 2 View  02/10/2014   CLINICAL DATA:  Initial encounter for left-sided pleural effusion.  EXAM: CHEST  2 VIEW  COMPARISON:  1 day prior  FINDINGS: Left internal jugular line terminates at the low SVC. Tracheostomy appropriately positioned.  Moderate left and small right pleural effusions are not significantly changed. No pneumothorax. Interstitial edema is mild and slightly decreased. Left greater than right lower lobe predominant airspace disease is similar.  IMPRESSION: Minimal improvement in congestive heart failure.  Otherwise, similar appearance of left greater than right pleural effusions and adjacent airspace disease. This could represent atelectasis or concurrent infection.   Electronically Signed   By: Abigail Miyamoto M.D.   On: 02/10/2014 09:10   Dg Chest 2 View  02/09/2014   CLINICAL DATA:  Cough and congestion.  Tracheostomy.  EXAM: CHEST  2 VIEW  COMPARISON:  02/04/2014.  FINDINGS: Tracheostomy tube and left IJ line in stable position. Interval removal of PICC line. Cardiomegaly with mild pulmonary venous congestion.  Bilateral pulmonary alveolar infiltrates. Bilateral pleural effusions are noted. Large pleural effusion on the left is new from prior exam. Right pleural effusion is slightly smaller on today's exam . These findings all may be secondary to congestive heart failure. Bilateral pneumonia cannot be excluded. No pneumothorax.  IMPRESSION: 1. Interim removal of PICC line. Tracheostomy tube and left IJ line in stable position. 2. Cardiomegaly with pulmonary venous congestion bilateral pulmonary alveolar infiltrates and pleural effusions suggesting congestive heart failure. Bilateral pneumonia cannot be excluded. 3. The left pleural effusion is a new finding on today's exam. Left pleural effusion is large. Right pleural effusion persists. The right pleural effusion is slightly smaller on today's exam.   Electronically Signed   By: [place]   On: 02/09/2014 14:03   Dg Swallowing Func-speech Pathology  02/09/2014   Mora Appl, CCC-SLP     02/09/2014  9:17 PM   Objective Swallowing Evaluation: Modified Barium Swallowing Study   Patient Details  Name: Allen Wood MRN: [id number] Date of Birth: 09/05/1970  Today's Date: 02/09/2014 Time: 24-1545 30 minutes   Past Medical History:  Past Medical History  Diagnosis Date  . Alcohol abuse   . Depression   . Anxiety   . Pneumonia    Past Surgical History:  Past Surgical History  Procedure Laterality Date  . Mouth surgery      teeth removed.  . Tracheostomy  01/30/14    feinstein  . Esophagogastroduodenoscopy N/A 02/03/2014    Procedure: ESOPHAGOGASTRODUODENOSCOPY (EGD);  Surgeon: Ladene Artist, MD;  Location: [facility] ENDOSCOPY;  Service: Endoscopy;   Laterality: N/A;  bedside/trach/vent  . Peg placement N/A 02/03/2014    Procedure: PERCUTANEOUS ENDOSCOPIC GASTROSTOMY (PEG) PLACEMENT;   Surgeon: Ladene Artist, MD;  Location: [facility] ENDOSCOPY;  Service:  Endoscopy;  Laterality: N/A;   HPI:  44 y/o M, smoker / ETOH abuse, admitted 12/21 with ongoing  fatigue, cough with productive  yellow sputum, R posterior back  pain, chills & sweats. Work up found patient to have RLL PNA  with positive strep antigen. Developed worsening respiratory  failure 12/22 with intubation, trach 1/5, thoracotomy 1/10, off  CRRT 1/9.  Currently with #8 cuffed Shiley; trach collar.      Recommendation/Prognosis  Clinical Impression:   Dysphagia Diagnosis: Mild oral phase dysphagia;Mild pharyngeal  phase dysphagia Clinical impression: Patient demonstrates a mild oral and  pharyngeal phase sensory-motor dysphagia complicated by  respiratory status.  Oral phase deficits are characterized by  mild weakness resulting in prolonged mastication of regular  textures. Pharyngeal phase deficits are characterized by  decreased base of tongue retraction, laryngeal elevation and  epiglottic inversion resulting in silent penetration  with large  boluses or consecutive sips of thin consistencies.  Patient also  demonstrates moderate vallecular residue with all consistencies  that was reduced with multiple swallows. However, due to  patient's poor recall and impulsivity he requires Max-Total  assist multimodal cues to recall and utilize safe swallow  strategies.  Recommend patient initiate a more conservative diet  given respiratory status; Dys.2 textures and nectar-thick liquids  with full staff assist and skilled SLP follow up.    Swallow Evaluation Recommendations:  Recommended Consults: MBS Diet Recommendations: Dysphagia 2 (Fine chop);Nectar-thick liquid Liquid Administration via: Cup;No straw Medication Administration: Whole meds with puree (or via PEG) Supervision: Patient able to self feed;Full supervision/cueing  for compensatory strategies Compensations: Slow rate;Small sips/bites;Multiple dry swallows  after each bite/sip (slow pace) Postural Changes and/or Swallow Maneuvers: Out of bed for  meals;Seated upright 90 degrees Oral Care Recommendations: Oral care prior to ice chips;Oral care  BID Other Recommendations: Order  thickener from pharmacy;Prohibited  food (jello, ice cream, thin soups);Have oral suction  available;Remove water pitcher Follow up Recommendations: 24 hour supervision/assistance;Home  health SLP;Outpatient SLP    Prognosis:  Prognosis for Safe Diet Advancement: Good Barriers to Reach Goals: Cognitive deficits   Individuals Consulted: Consulted and Agree with Results and  Recommendations: Patient;PA;RN      SLP Assessment/Plan  Plan:  Potential to Achieve Goals (ACUTE ONLY): Good Potential Considerations (ACUTE ONLY): Ability to learn/carryover  information;Co-morbidities   Short Term Goals: Week 1: SLP Short Term Goal 1 (Week 1): Pt will  consume Dys.2 textures and nectar-thick liquids with Mod cues to  recall and utilize safe swallow strategies to minimze overt s/s  of aspiration SLP Short Term Goal 2 (Week 1): Pt will demonstrate ability to  donn and doff PMSV with Mod I  SLP Short Term Goal 3 (Week 1): Pt will tolerate PMSV during all  waking hours with with intermittent supervision  SLP Short Term Goal 4 (Week 1): Pt will request help as needed  with Mod question cues  SLP Short Term Goal 5 (Week 1): Pt will solve basic problems  realted to safe self-care with Min verbal cues    General: Type of Study: Modified Barium Swallowing Study Reason for Referral: Objectively evaluate swallowing function Previous Swallow Assessment: BSE 02/08/14 Diet Prior to this Study: NPO;PEG tube;Other (Comment) (ice chips  afer oral care) Temperature Spikes Noted: No Respiratory Status: Trach Trach Size and Type: #8;Cuff;Deflated;With PMSV in place History of Recent Intubation: Yes Length of Intubations (days): 13 days Date extubated: 01/29/14 Behavior/Cognition: Alert;Cooperative;Pleasant mood;Requires  cueing;Doesn't follow directions Oral Cavity - Dentition: Adequate natural dentition Oral Motor / Sensory Function: Within functional limits Self-Feeding Abilities: Able to feed self;Needs assist Patient Positioning: Upright in  chair Baseline Vocal Quality: Clear;Low vocal intensity;Breathy Volitional Cough: Strong Volitional Swallow: Able to elicit Anatomy: Within functional limits Pharyngeal Secretions:  (coated with barium in pharynx)   Reason for Referral:   Objectively evaluate swallowing function    Oral Phase: Oral Preparation/Oral Phase Oral Phase: Impaired Oral - Solids Oral - Puree: Within functional limits Oral - Regular: Other (Comment) (prolonged mastication and due to  breath support recommend softer solids)   Pharyngeal Phase:  Pharyngeal Phase Pharyngeal Phase: Impaired Pharyngeal - Nectar Pharyngeal - Nectar Cup: Compensatory strategies attempted  (Comment);Reduced tongue base retraction;Pharyngeal residue -  valleculae;Reduced epiglottic inversion (cues for small sips and  a slow pace due to work of breathing ) Penetration/Aspiration details (nectar cup): Material does not  enter airway Pharyngeal -  Thin Pharyngeal - Thin Cup: Reduced tongue base retraction;Pharyngeal  residue - valleculae;Penetration/Aspiration after  swallow;Compensatory strategies attempted (Comment);Reduced  epiglottic inversion (cues for cough cleared penetrates) Penetration/Aspiration details (thin cup): Material enters  airway, remains ABOVE vocal cords and not ejected out Pharyngeal - Thin Straw: Reduced epiglottic inversion;Reduced  tongue base retraction;Penetration/Aspiration after  swallow;Pharyngeal residue - valleculae;Compensatory strategies  attempted (Comment) (cues for cough cleared penetrates) Penetration/Aspiration details (thin straw): Material enters  airway, remains ABOVE vocal cords and not ejected out Pharyngeal - Solids Pharyngeal - Puree: Reduced epiglottic inversion;Reduced tongue  base retraction;Pharyngeal residue - valleculae;Compensatory  strategies attempted (Comment) (cues for multiple swallows  cleared residue ) Pharyngeal - Regular: Reduced epiglottic inversion;Reduced tongue  base retraction;Pharyngeal residue -  valleculae;Compensatory  strategies attempted (Comment) (cues for multiple swallows  cleared residue )   Cervical Esophageal Phase  Cervical Esophageal Phase Cervical Esophageal Phase: [facility]   GN         Gunnar Fusi, M.A., CCC-SLP [phone]  BOWIE,MELISSA 02/09/2014, 9:16 PM                    US Thoracentesis Asp Pleural Space W/img Guide  02/10/2014   INDICATION: Patient with prior history of pneumonia and bilateral pleural effusions, left greater than right. Request is made for therapeutic left thoracentesis.  EXAM: ULTRASOUND GUIDED THERAPEUTIC LEFT THORACENTESIS.  COMPARISON:  Prior thoracentesis on 02/04/2014  MEDICATIONS: None  COMPLICATIONS: None immediate  TECHNIQUE: Informed written consent was obtained from the patient after a discussion of the risks, benefits and alternatives to treatment. A timeout was performed prior to the initiation of the procedure.  Initial ultrasound scanning demonstrates a moderate to large left pleural effusion. The lower chest was prepped and draped in the usual sterile fashion. 1% lidocaine was used for local anesthesia.  An ultrasound image was saved for documentation purposes. An 8 Fr Safe-T-Centesis catheter was introduced. The thoracentesis was performed. The catheter was removed and a dressing was applied. The patient tolerated the procedure well without immediate post procedural complication. The patient was escorted to have an upright chest radiograph.  FINDINGS: A total of approximately 1.6 liters of slightly turbid, yellow fluid was removed.  IMPRESSION: Successful ultrasound-guided therapeutic left sided thoracentesis yielding 1.6 liters of pleural fluid.  Read by: Rowe Robert, PA-C   Electronically Signed   By: Markus Daft M.D.   On: 02/10/2014 13:00    Recent Labs  02/09/14 0728 02/10/14 0902  WBC 19.7* 17.3*  HGB 7.2* 7.3*  HCT 22.6* 22.9*  PLT 498* 488*    Recent Labs  02/09/14 0728 02/10/14 0902  NA 145 144  K 4.1 4.3  CL 103 105  GLUCOSE 99  123*  BUN 28* 23  CREATININE 0.81 0.85  CALCIUM 9.1 9.0   CBG (last 3)  No results for input(s): GLUCAP in the last 72 hours.  Wt Readings from Last 3 Encounters:  02/10/14 73.3 kg (161 lb 9.6 oz)  02/03/14 75.2 kg (165 lb 12.6 oz)  09/14/13 65.726 kg (144 lb 14.4 oz)    Physical Exam:  Constitutional: He appears well-developed. He has a sickly appearance.   HENT:  Head: Normocephalic and atraumatic.    Eyes: Conjunctivae are normal. Pupils are equal, round, and reactive to light.  Neck:  # 8 cuffed trach in place with frequent   secretions. Diateck cath left neck.  Cardiovascular: Regular rhythm. Tachycardia present.  HR 100's  Respiratory: Effort normal.  Marked decrease BS on left but clear;  Rales lower 2/3 R chest posteriorly GI: Normal appearance and bowel sounds are normal. Distention: mild distension. There is tenderness.  PEG site tender but clean and dry.  Genitourinary:  Foley  in place.  Musculoskeletal: He exhibits no edema or tenderness.  Neurological: He is alert.  Able to phonate occasionally. Moves all four.distracted.  Motor strength is 3+ to 4-/5 bilateral deltoid,, bicep tricep, 5 minus grip, 3/5 hip flexors 4 minus knee extensors 4+ ankle dorsiflexors Withdraws to pain on all 4's Psych: restless   Medical Problem List and Plan: 1. Functional deficits secondary to Critical Illness myopathy.  2. DVT Prophylaxis/Anticoagulation: Mechanical: Sequential compression devices, below knee Bilateral lower extremities 3. Pain Management: Continue fentanyl 75 mcg/hr and wean over the next few days.  4. Anxiety disorder/Mood: Was on Wellbutrin and Zoloft at home. Continue Klonopin bid for now. May need to resume zoloft. LCSW to follow for evaluation and support.  5. Neuropsych: This patient is capable of making decisions on his own behalf. 6. Skin/Wound Care: Routine pressure relief measures. Maintain adequate nutritional and hydration status.   7. Fluids/Electrolytes/Nutrition: Continue tube feeds. Add water boluses for hydration.  8. Dysphagia: Continue TF for now. Question FEES soon as able to tolerate PMSV with good breath support.  9. VDRF: Continue nebs every 6 hours to help mobilize thick secretions.  Unchanged pleural effusions.                   Will ask CCM to follow 10. Resting Tachycardia: Due to deconditioning--on metoprolol 25 mg bid to help maintain HR < 115 bpm  11. Anemia: Will need colonoscopy on outpatient basis. Monitor H/H with transfusion prn drop in Hgb <7.0.  12. Acute renal failure: Resolving with stable BUN/Cr  13. Fever: re-check cxr today- little change in bilateral  effusions c/t 02/09/14 LOS (Days) 3 A FACE TO FACE EVALUATION WAS PERFORMED  Nyoka Cowden 02/11/2014 9:38 AM

## 2014-02-11 NOTE — Progress Notes (Signed)
NT Tanya reported temperature of 99.3 axillary. Nurse administered tylenol at [phone]. Will continue to monitor temperature. Nurse encouraged deep breathing and coughing.

## 2014-02-11 NOTE — Progress Notes (Signed)
Physical Therapy Session Note  Patient Details  Name: Allen Wood MRN: [id number] Date of Birth: December 06, 1970  Today's Date: 02/11/2014 PT Individual Time: 1030-1100 PT Individual Time Calculation (min): 30 min   Short Term Goals: Week 1:  PT Short Term Goal 1 (Week 1): pt will perform basic transfers with S and safe hand placement 5/5 trials PT Short Term Goal 2 (Week 1): Pt will ambulate 100' with RW and Min A PT Short Term Goal 3 (Week 1): Pt will perform 10 stairs with 2 rails and min A PT Short Term Goal 4 (Week 1): Pt will propel WC x150' with S PT Short Term Goal 5 (Week 1): Pt will reach 10" outside BOS for functional reaching  Skilled Therapeutic Interventions/Progress Updates:    Pt received seated EOB w/ wife present, agreeable to participate in therapy. Session focused on balance evaluation w/ Berg Balance Test. Pt transferred bed>w/c w/ stand pivot to L and MinA. Instructed pt in [employer] in rehab gym, see details below. Pt scored 26/56, placing pt into high risk fall category. Educated pt and wife on falls risk while in hospital and potential areas of improvement before discharge. Pt and wife agreed with interpretation of test. Session ended in pt's room, where pt was left seated in w/c w/ all needs within reach.    Therapy Documentation Precautions:  Precautions Precautions: Other (comment), Fall (Trach collar) Precaution Comments: trach, peg, flexiseal Restrictions Weight Bearing Restrictions: No Pain:  Complained of mild muscular pain in ribs only when coughing. Balance: Balance Balance Assessed: Yes Standardized Balance Assessment Standardized Balance Assessment: Berg Balance Test Berg Balance Test Sit to Stand: Needs minimal aid to stand or to stabilize Standing Unsupported: Able to stand 2 minutes with supervision Sitting with Back Unsupported but Feet Supported on Floor or Stool: Able to sit safely and securely 2 minutes Stand to Sit: Controls  descent by using hands Transfers: Needs one person to assist Standing Unsupported with Eyes Closed: Able to stand 10 seconds with supervision Standing Ubsupported with Feet Together: Able to place feet together independently and stand for 1 minute with supervision From Standing, Reach Forward with Outstretched Arm: Reaches forward but needs supervision From Standing Position, Pick up Object from Floor: Able to pick up shoe, needs supervision From Standing Position, Turn to Look Behind Over each Shoulder: Turn sideways only but maintains balance (Head turning limited by trach collar and PICC line) Turn 360 Degrees: Needs assistance while turning Standing Unsupported, Alternately Place Feet on Step/Stool: Able to complete >2 steps/needs minimal assist Standing Unsupported, One Foot in Front: Loses balance while stepping or standing Standing on One Leg: Tries to lift leg/unable to hold 3 seconds but remains standing independently Total Score: 26  See FIM for current functional status  Therapy/Group: Individual Therapy  Rada Hay  Rada Hay, PT, DPT 02/11/2014, 7:38 AM

## 2014-02-11 NOTE — Plan of Care (Signed)
Problem: RH SAFETY Goal: RH STG ADHERE TO SAFETY PRECAUTIONS W/ASSISTANCE/DEVICE STG Adhere to Safety Precautions With Assistance/Device. Min assist  Outcome: Not Progressing Pt continues to attempt to get out of bed at night alone.

## 2014-02-11 NOTE — Progress Notes (Signed)
Patient is refusing to be suctioned at this time.

## 2014-02-12 ENCOUNTER — Inpatient Hospital Stay (HOSPITAL_COMMUNITY)
Admission: AD | Admit: 2014-02-12 | Discharge: 2014-02-22 | DRG: 871 | Disposition: A | Discharge status: Home Health | Payer: Medicaid Other | Source: Ambulatory Visit | Attending: Internal Medicine | Admitting: Internal Medicine

## 2014-02-12 ENCOUNTER — Inpatient Hospital Stay (HOSPITAL_COMMUNITY): Payer: Medicaid Other

## 2014-02-12 ENCOUNTER — Encounter (HOSPITAL_COMMUNITY): Payer: Medicaid Other

## 2014-02-12 ENCOUNTER — Inpatient Hospital Stay (HOSPITAL_COMMUNITY): Payer: Medicaid Other | Admitting: Speech Pathology

## 2014-02-12 DIAGNOSIS — Y95 Nosocomial condition: Secondary | ICD-10-CM | POA: Diagnosis present

## 2014-02-12 DIAGNOSIS — K2980 Duodenitis without bleeding: Secondary | ICD-10-CM | POA: Diagnosis present

## 2014-02-12 DIAGNOSIS — R0781 Pleurodynia: Secondary | ICD-10-CM | POA: Diagnosis present

## 2014-02-12 DIAGNOSIS — R197 Diarrhea, unspecified: Secondary | ICD-10-CM | POA: Diagnosis present

## 2014-02-12 DIAGNOSIS — Z930 Tracheostomy status: Secondary | ICD-10-CM

## 2014-02-12 DIAGNOSIS — J189 Pneumonia, unspecified organism: Secondary | ICD-10-CM | POA: Diagnosis present

## 2014-02-12 DIAGNOSIS — I10 Essential (primary) hypertension: Secondary | ICD-10-CM | POA: Diagnosis present

## 2014-02-12 DIAGNOSIS — J90 Pleural effusion, not elsewhere classified: Secondary | ICD-10-CM | POA: Diagnosis present

## 2014-02-12 DIAGNOSIS — R1310 Dysphagia, unspecified: Secondary | ICD-10-CM | POA: Diagnosis present

## 2014-02-12 DIAGNOSIS — F17210 Nicotine dependence, cigarettes, uncomplicated: Secondary | ICD-10-CM | POA: Diagnosis present

## 2014-02-12 DIAGNOSIS — F418 Other specified anxiety disorders: Secondary | ICD-10-CM | POA: Diagnosis present

## 2014-02-12 DIAGNOSIS — R6521 Severe sepsis with septic shock: Secondary | ICD-10-CM | POA: Diagnosis present

## 2014-02-12 DIAGNOSIS — D649 Anemia, unspecified: Secondary | ICD-10-CM | POA: Diagnosis present

## 2014-02-12 DIAGNOSIS — A419 Sepsis, unspecified organism: Secondary | ICD-10-CM | POA: Insufficient documentation

## 2014-02-12 DIAGNOSIS — J918 Pleural effusion in other conditions classified elsewhere: Secondary | ICD-10-CM

## 2014-02-12 DIAGNOSIS — Z79899 Other long term (current) drug therapy: Secondary | ICD-10-CM | POA: Diagnosis not present

## 2014-02-12 DIAGNOSIS — D638 Anemia in other chronic diseases classified elsewhere: Secondary | ICD-10-CM | POA: Diagnosis present

## 2014-02-12 DIAGNOSIS — E46 Unspecified protein-calorie malnutrition: Secondary | ICD-10-CM | POA: Diagnosis present

## 2014-02-12 DIAGNOSIS — J9690 Respiratory failure, unspecified, unspecified whether with hypoxia or hypercapnia: Secondary | ICD-10-CM | POA: Diagnosis present

## 2014-02-12 DIAGNOSIS — F1027 Alcohol dependence with alcohol-induced persisting dementia: Secondary | ICD-10-CM

## 2014-02-12 DIAGNOSIS — E871 Hypo-osmolality and hyponatremia: Secondary | ICD-10-CM | POA: Diagnosis not present

## 2014-02-12 DIAGNOSIS — Z9689 Presence of other specified functional implants: Secondary | ICD-10-CM

## 2014-02-12 DIAGNOSIS — E870 Hyperosmolality and hypernatremia: Secondary | ICD-10-CM | POA: Diagnosis present

## 2014-02-12 DIAGNOSIS — J9621 Acute and chronic respiratory failure with hypoxia: Secondary | ICD-10-CM | POA: Diagnosis present

## 2014-02-12 DIAGNOSIS — F10239 Alcohol dependence with withdrawal, unspecified: Secondary | ICD-10-CM | POA: Diagnosis present

## 2014-02-12 DIAGNOSIS — R6520 Severe sepsis without septic shock: Secondary | ICD-10-CM

## 2014-02-12 LAB — CBC
HCT: 24.8 % — ABNORMAL LOW (ref 39.0–52.0)
HCT: 24.4 % — ABNORMAL LOW (ref 39.0–52.0)
HEMATOCRIT: 21.3 % — AB (ref 39.0–52.0)
HEMOGLOBIN: 6.8 g/dL — AB (ref 13.0–17.0)
Hemoglobin: 8.3 g/dL — ABNORMAL LOW (ref 13.0–17.0)
Hemoglobin: 8.2 g/dL — ABNORMAL LOW (ref 13.0–17.0)
MCH: 33.2 pg (ref 26.0–34.0)
MCH: 32.4 pg (ref 26.0–34.0)
MCH: 32.4 pg (ref 26.0–34.0)
MCHC: 31.9 g/dL (ref 30.0–36.0)
MCHC: 33.5 g/dL (ref 30.0–36.0)
MCHC: 33.6 g/dL (ref 30.0–36.0)
MCV: 103.9 fL — ABNORMAL HIGH (ref 78.0–100.0)
MCV: 96.9 fL (ref 78.0–100.0)
MCV: 96.4 fL (ref 78.0–100.0)
PLATELETS: 389 10*3/uL (ref 150–400)
PLATELETS: 429 10*3/uL — AB (ref 150–400)
Platelets: 416 10*3/uL — ABNORMAL HIGH (ref 150–400)
RBC: 2.05 MIL/uL — AB (ref 4.22–5.81)
RBC: 2.56 MIL/uL — AB (ref 4.22–5.81)
RBC: 2.53 MIL/uL — ABNORMAL LOW (ref 4.22–5.81)
RDW: 14.9 % (ref 11.5–15.5)
RDW: 16.6 % — ABNORMAL HIGH (ref 11.5–15.5)
RDW: 16.5 % — AB (ref 11.5–15.5)
WBC: 14.3 10*3/uL — ABNORMAL HIGH (ref 4.0–10.5)
WBC: 18.3 10*3/uL — ABNORMAL HIGH (ref 4.0–10.5)
WBC: 18.2 10*3/uL — ABNORMAL HIGH (ref 4.0–10.5)

## 2014-02-12 LAB — BASIC METABOLIC PANEL
ANION GAP: 4 — AB (ref 5–15)
BUN: 19 mg/dL (ref 6–23)
CO2: 30 mmol/L (ref 19–32)
Calcium: 8.5 mg/dL (ref 8.4–10.5)
Chloride: 102 mEq/L (ref 96–112)
Creatinine, Ser: 0.78 mg/dL (ref 0.50–1.35)
GFR calc non Af Amer: >90 mL/min (ref >90)
Glucose, Bld: 96 mg/dL (ref 70–99)
Potassium: 5 mmol/L (ref 3.5–5.1)
Sodium: 136 mmol/L (ref 135–145)

## 2014-02-12 LAB — COMPREHENSIVE METABOLIC PANEL
ALBUMIN: 1.6 g/dL — AB (ref 3.5–5.2)
ALK PHOS: 46 U/L (ref 39–117)
ALT: 17 U/L (ref 0–53)
AST: 20 U/L (ref 0–37)
Anion gap: 7 (ref 5–15)
BILIRUBIN TOTAL: 0.3 mg/dL (ref 0.3–1.2)
BUN: 19 mg/dL (ref 6–23)
CO2: 31 mmol/L (ref 19–32)
Calcium: 8.5 mg/dL (ref 8.4–10.5)
Chloride: 103 mEq/L (ref 96–112)
Creatinine, Ser: 0.83 mg/dL (ref 0.50–1.35)
GLUCOSE: 110 mg/dL — AB (ref 70–99)
Potassium: 4.8 mmol/L (ref 3.5–5.1)
Sodium: 141 mmol/L (ref 135–145)
Total Protein: 5 g/dL — ABNORMAL LOW (ref 6.0–8.3)

## 2014-02-12 LAB — CREATININE, SERUM
CREATININE: 0.86 mg/dL (ref 0.50–1.35)
GFR calc Af Amer: >90 mL/min (ref >90)

## 2014-02-12 LAB — MRSA PCR SCREENING: MRSA BY PCR: NEGATIVE

## 2014-02-12 LAB — PREPARE RBC (CROSSMATCH)

## 2014-02-12 LAB — LACTIC ACID, PLASMA: Lactic Acid, Venous: 1 mmol/L (ref 0.5–2.2)

## 2014-02-12 LAB — PROCALCITONIN: Procalcitonin: 0.17 ng/mL

## 2014-02-12 MED ORDER — SODIUM CHLORIDE 0.9 % IV SOLN: 250.0000 mL | INTRAVENOUS | Status: DC | PRN

## 2014-02-12 MED ORDER — SODIUM CHLORIDE 0.9 % IV SOLN
Freq: Once | INTRAVENOUS | Status: AC
  Administered 2014-02-12: 14:00:00 via INTRAVENOUS

## 2014-02-12 MED ORDER — FREE WATER
200.0000 mL | Freq: Every day | Status: DC
  Administered 2014-02-12: 200 mL

## 2014-02-12 MED ORDER — SODIUM CHLORIDE 0.9 % IV SOLN
INTRAVENOUS | Status: DC
  Administered 2014-02-12 – 2014-02-14 (×2): via INTRAVENOUS

## 2014-02-12 MED ORDER — SODIUM CHLORIDE 0.9 % IJ SOLN
10.0000 mL | Freq: Two times a day (BID) | INTRAMUSCULAR | Status: DC
  Administered 2014-02-12 – 2014-02-16 (×8): 10 mL
  Administered 2014-02-16: 20 mL
  Administered 2014-02-17 – 2014-02-20 (×5): 10 mL
  Administered 2014-02-21: 3 mL

## 2014-02-12 MED ORDER — CLONIDINE HCL 0.1 MG/24HR TD PTWK: 0.1000 mg | MEDICATED_PATCH | TRANSDERMAL | Status: DC

## 2014-02-12 MED ORDER — ONDANSETRON HCL 4 MG/2ML IJ SOLN: 4.0000 mg | Freq: Four times a day (QID) | INTRAMUSCULAR | Status: DC | PRN

## 2014-02-12 MED ORDER — GUAIFENESIN-DM 100-10 MG/5ML PO SYRP: 5.0000 mL | ORAL_SOLUTION | Freq: Four times a day (QID) | ORAL | Status: DC | PRN

## 2014-02-12 MED ORDER — FERROUS SULFATE 300 (60 FE) MG/5ML PO SYRP
300.0000 mg | ORAL_SOLUTION | Freq: Two times a day (BID) | ORAL | Status: DC
  Administered 2014-02-12 – 2014-02-22 (×20): 300 mg
  Filled 2014-02-12 (×25): qty 5

## 2014-02-12 MED ORDER — PIPERACILLIN-TAZOBACTAM 3.375 G IVPB 30 MIN
3.3750 g | Freq: Once | INTRAVENOUS | Status: AC
  Administered 2014-02-12: 3.375 g via INTRAVENOUS
  Filled 2014-02-12: qty 50

## 2014-02-12 MED ORDER — FREE WATER
250.0000 mL | Freq: Four times a day (QID) | Status: DC
Start: 2014-02-12 — End: 2014-02-15
  Administered 2014-02-12 – 2014-02-15 (×10): 250 mL

## 2014-02-12 MED ORDER — PIPERACILLIN-TAZOBACTAM 3.375 G IVPB
3.3750 g | Freq: Three times a day (TID) | INTRAVENOUS | Status: AC
  Administered 2014-02-12 – 2014-02-17 (×16): 3.375 g via INTRAVENOUS
  Filled 2014-02-12 (×17): qty 50

## 2014-02-12 MED ORDER — FENTANYL CITRATE 0.05 MG/ML IJ SOLN
INTRAMUSCULAR | Status: AC
  Filled 2014-02-12: qty 2

## 2014-02-12 MED ORDER — SODIUM CHLORIDE 0.9 % IV SOLN
1250.0000 mg | Freq: Once | INTRAVENOUS | Status: AC
  Administered 2014-02-12: 1250 mg via INTRAVENOUS
  Filled 2014-02-12: qty 1250

## 2014-02-12 MED ORDER — METOPROLOL TARTRATE 25 MG/10 ML ORAL SUSPENSION
25.0000 mg | Freq: Two times a day (BID) | ORAL | Status: DC
  Administered 2014-02-12 – 2014-02-22 (×20): 25 mg
  Filled 2014-02-12 (×23): qty 10

## 2014-02-12 MED ORDER — FOLIC ACID 1 MG PO TABS
1.0000 mg | ORAL_TABLET | Freq: Every day | ORAL | Status: DC
  Administered 2014-02-13 – 2014-02-22 (×10): 1 mg
  Filled 2014-02-12 (×10): qty 1

## 2014-02-12 MED ORDER — MIDAZOLAM HCL 2 MG/2ML IJ SOLN
INTRAMUSCULAR | Status: AC
  Filled 2014-02-12: qty 2

## 2014-02-12 MED ORDER — VANCOMYCIN HCL IN DEXTROSE 750-5 MG/150ML-% IV SOLN
750.0000 mg | Freq: Three times a day (TID) | INTRAVENOUS | Status: DC
  Administered 2014-02-12 – 2014-02-15 (×10): 750 mg via INTRAVENOUS
  Filled 2014-02-12 (×11): qty 150

## 2014-02-12 MED ORDER — CLONAZEPAM 1 MG PO TABS: 2.0000 mg | ORAL_TABLET | Freq: Two times a day (BID) | ORAL | Status: DC

## 2014-02-12 MED ORDER — CHLORHEXIDINE GLUCONATE 0.12 % MT SOLN
15.0000 mL | Freq: Two times a day (BID) | OROMUCOSAL | Status: DC
  Administered 2014-02-12 – 2014-02-22 (×20): 15 mL via OROMUCOSAL
  Filled 2014-02-12 (×20): qty 15

## 2014-02-12 MED ORDER — CLONAZEPAM 1 MG PO TABS
2.0000 mg | ORAL_TABLET | Freq: Two times a day (BID) | ORAL | Status: DC
  Administered 2014-02-12 – 2014-02-14 (×4): 2 mg
  Filled 2014-02-12 (×4): qty 2

## 2014-02-12 MED ORDER — OSMOLITE 1.5 CAL PO LIQD
350.0000 mL | Freq: Four times a day (QID) | ORAL | Status: DC
  Filled 2014-02-12 (×6): qty 474

## 2014-02-12 MED ORDER — LORAZEPAM 2 MG/ML IJ SOLN
1.0000 mg | INTRAMUSCULAR | Status: DC | PRN
  Administered 2014-02-12: 1 mg via INTRAVENOUS

## 2014-02-12 MED ORDER — CHLORHEXIDINE GLUCONATE 0.12 % MT SOLN
OROMUCOSAL | Status: AC
  Filled 2014-02-12: qty 15

## 2014-02-12 MED ORDER — HEPARIN SODIUM (PORCINE) 5000 UNIT/ML IJ SOLN
5000.0000 [IU] | Freq: Three times a day (TID) | INTRAMUSCULAR | Status: DC
  Administered 2014-02-12 – 2014-02-14 (×6): 5000 [IU] via SUBCUTANEOUS
  Filled 2014-02-12 (×7): qty 1

## 2014-02-12 MED ORDER — IPRATROPIUM-ALBUTEROL 0.5-2.5 (3) MG/3ML IN SOLN
RESPIRATORY_TRACT | Status: AC
  Filled 2014-02-12: qty 3

## 2014-02-12 MED ORDER — FENTANYL CITRATE 0.05 MG/ML IJ SOLN
50.0000 ug | Freq: Once | INTRAMUSCULAR | Status: AC
  Administered 2014-02-12: 50 ug via INTRAVENOUS

## 2014-02-12 MED ORDER — SODIUM CHLORIDE 0.9 % IV SOLN
Freq: Once | INTRAVENOUS | Status: AC
  Administered 2014-02-12: 11:00:00 via INTRAVENOUS

## 2014-02-12 MED ORDER — CETYLPYRIDINIUM CHLORIDE 0.05 % MT LIQD
7.0000 mL | Freq: Four times a day (QID) | OROMUCOSAL | Status: DC
  Administered 2014-02-12 – 2014-02-22 (×32): 7 mL via OROMUCOSAL

## 2014-02-12 MED ORDER — DOCUSATE SODIUM 50 MG/5ML PO LIQD
100.0000 mg | Freq: Two times a day (BID) | ORAL | Status: DC | PRN
  Filled 2014-02-12: qty 10

## 2014-02-12 MED ORDER — FUROSEMIDE 10 MG/ML IJ SOLN
20.0000 mg | Freq: Once | INTRAMUSCULAR | Status: DC
  Filled 2014-02-12: qty 2

## 2014-02-12 MED ORDER — VITAMIN B-1 100 MG PO TABS
100.0000 mg | ORAL_TABLET | Freq: Every day | ORAL | Status: DC
  Administered 2014-02-13 – 2014-02-22 (×10): 100 mg
  Filled 2014-02-12 (×10): qty 1

## 2014-02-12 MED ORDER — FENTANYL CITRATE 0.05 MG/ML IJ SOLN
100.0000 ug | Freq: Once | INTRAMUSCULAR | Status: AC
  Administered 2014-02-12: 100 ug via INTRAVENOUS

## 2014-02-12 MED ORDER — MIDAZOLAM HCL 2 MG/2ML IJ SOLN
1.0000 mg | Freq: Once | INTRAMUSCULAR | Status: AC
  Administered 2014-02-12: 1 mg via INTRAVENOUS

## 2014-02-12 MED ORDER — TRAZODONE 25 MG HALF TABLET
25.0000 mg | ORAL_TABLET | Freq: Every evening | ORAL | Status: DC | PRN
  Administered 2014-02-13: 50 mg via ORAL
  Administered 2014-02-18 (×2): 25 mg via ORAL
  Filled 2014-02-12 (×5): qty 2

## 2014-02-12 MED ORDER — LORAZEPAM 2 MG/ML IJ SOLN
INTRAMUSCULAR | Status: AC
  Filled 2014-02-12: qty 1

## 2014-02-12 MED ORDER — PRO-STAT SUGAR FREE PO LIQD
30.0000 mL | Freq: Every day | ORAL | Status: DC
  Administered 2014-02-12 – 2014-02-18 (×7): 30 mL
  Filled 2014-02-12 (×7): qty 30

## 2014-02-12 MED ORDER — MIDAZOLAM HCL 2 MG/2ML IJ SOLN
2.0000 mg | Freq: Once | INTRAMUSCULAR | Status: AC
  Administered 2014-02-12: 2 mg via INTRAVENOUS

## 2014-02-12 MED ORDER — NICOTINE 21 MG/24HR TD PT24: 21.0000 mg | MEDICATED_PATCH | Freq: Every day | TRANSDERMAL | Status: DC

## 2014-02-12 MED ORDER — OSMOLITE 1.5 CAL PO LIQD
350.0000 mL | Freq: Four times a day (QID) | ORAL | Status: DC
  Administered 2014-02-12 – 2014-02-14 (×7): 350 mL
  Administered 2014-02-14: 20:00:00
  Administered 2014-02-14 – 2014-02-18 (×10): 350 mL
  Filled 2014-02-12 (×30): qty 474

## 2014-02-12 MED ORDER — FENTANYL 25 MCG/HR TD PT72: 75.0000 ug | MEDICATED_PATCH | TRANSDERMAL | Status: DC

## 2014-02-12 MED ORDER — IPRATROPIUM-ALBUTEROL 0.5-2.5 (3) MG/3ML IN SOLN
3.0000 mL | Freq: Four times a day (QID) | RESPIRATORY_TRACT | Status: DC
  Administered 2014-02-13: 3 mL via RESPIRATORY_TRACT
  Filled 2014-02-12: qty 3

## 2014-02-12 MED ORDER — ONDANSETRON HCL 4 MG PO TABS: 4.0000 mg | ORAL_TABLET | Freq: Four times a day (QID) | ORAL | Status: DC | PRN

## 2014-02-12 MED ORDER — PANTOPRAZOLE SODIUM 40 MG PO PACK
40.0000 mg | PACK | Freq: Every day | ORAL | Status: DC
  Administered 2014-02-13 – 2014-02-22 (×10): 40 mg
  Filled 2014-02-12 (×10): qty 20

## 2014-02-12 MED ORDER — ADULT MULTIVITAMIN LIQUID CH
5.0000 mL | Freq: Every day | ORAL | Status: DC
  Administered 2014-02-13 – 2014-02-22 (×10): 5 mL
  Filled 2014-02-12 (×10): qty 5

## 2014-02-12 MED ORDER — ACETAMINOPHEN 325 MG PO TABS: 650.0000 mg | ORAL_TABLET | Freq: Once | ORAL | Status: DC

## 2014-02-12 NOTE — Progress Notes (Addendum)
Speech Language Pathology Daily Session Note  Patient Details  Name: Levin Dagostino MRN: [id number] Date of Birth: [birth year]  Today's Date: 02/12/2014 SLP Individual Time: 4010-2725 SLP Individual Time Calculation (min): 15 min  Short Term Goals: Week 1: SLP Short Term Goal 1 (Week 1): Pt will consume Dys.2 textures and nectar-thick liquids with Mod cues to recall and utilize safe swallow strategies to minimze overt s/s of aspiration SLP Short Term Goal 2 (Week 1): Pt will demonstrate ability to donn and doff PMSV with Mod I  SLP Short Term Goal 3 (Week 1): Pt will tolerate PMSV during all waking hours with with intermittent supervision  SLP Short Term Goal 4 (Week 1): Pt will request help as needed with Mod question cues  SLP Short Term Goal 5 (Week 1): Pt will solve basic problems realted to safe self-care with Min verbal cues  Skilled Therapeutic Interventions: Skilled treatment session focused on addressing patient and family education regarding implications of medical change in status. Despite SpO2 staying in the mid 90s, considering patient's increased work of breathing SLP recommends holding PMSV and therefore PO at this time.  Patient was provided with white board and marker for expressive communication of wants and needs and was informed that SLP will follow up on other unit after transfer.  Given patient's pending transfer and overall fatigue session was ended early and patient missed 30 minutes of skilled SLP services.     Pain Pain Assessment Pain Assessment: No/denies pain  Therapy/Group: Individual Therapy  Carmelia Roller., [place] [phone]  [place] 02/12/2014, 9:12 AM

## 2014-02-12 NOTE — Procedures (Signed)
Chest Tube Placement  Left sided chest tube inserted due to pleural effusion.  Consent from patient, patient positioned and cleaned.  Lidocaine injected, skin incision done followed by blunt dissection, chest cavity entered, size 32 chest tube placed and sutured and bandaged.  Suction at 20 cm of water.  CXR ordered and pending.  Rush Farmer, M.D. [facility] Pulmonary/Critical Care Medicine. Pager: [phone]. After hours pager: [phone].

## 2014-02-12 NOTE — Progress Notes (Signed)
Received critical Hemoglobin value of 6.8 from lab, this information conveyed to Dr Read Drivers

## 2014-02-12 NOTE — Progress Notes (Signed)
Patient's lung status hasn't improved and he is unable to participate in rehabilitative therapy at this time. Decision was made to move patient to 2 Heart [facility] for more intense medical treatment.

## 2014-02-12 NOTE — Progress Notes (Signed)
Assisted with transporting patient from 4W to rm 2H13.  Patient transported with zoll heart monitor, and O2 via trach. Rn at bedside to receive patient.

## 2014-02-12 NOTE — Discharge Summary (Signed)
Physician Discharge Summary  Patient ID: Allen Wood MRN: [id number] DOB/AGE: [birth year] 46 y.o.  Admit date: 02/08/2014 Discharge date: 02/12/2014  Discharge Diagnoses:  Principal Problem:   Critical illness myopathy Active Problems:   EtOH dependence   Pleural effusion   Dysphagia, oropharyngeal phase   Status post tracheostomy   Status post gastrostomy   Discharged Condition: Stable  Significant Diagnostic Studies: Dg Chest 1 View  02/10/2014   CLINICAL DATA:  Status post left thoracentesis.  EXAM: CHEST - 1 VIEW  COMPARISON:  02/09/2014.  FINDINGS: Little change in a large left pleural effusion. No pneumothorax. More discrete nodular density in the right mid lung zone, measuring 3.7 x 2.0 cm on the frontal view that had a more linear appearance with lucency previously, without the appearance of a discrete mass yesterday. No significant change in a small right pleural effusion and linear density at the right lung base. Diffuse osteopenia. Tracheostomy tube in satisfactory position. Stable left jugular catheter.  IMPRESSION: 1. No pneumothorax following left thoracentesis. 2. No significant change in bilateral pleural effusions. 3. Interval 3.7 x 2.0 cm mass like density in the right mid lung zone. This may represent loculated fluid in a fissure. A previously partially obscured mass is also a possibility. 4. No significant change in mild right basilar atelectasis.   Electronically Signed   By: Enrique Sack M.D.   On: 02/10/2014 13:43   Dg Chest 2 View  02/10/2014   CLINICAL DATA:  Initial encounter for left-sided pleural effusion.  EXAM: CHEST  2 VIEW  COMPARISON:  1 day prior  FINDINGS: Left internal jugular line terminates at the low SVC. Tracheostomy appropriately positioned.  Moderate left and small right pleural effusions are not significantly changed. No pneumothorax. Interstitial edema is mild and slightly decreased. Left greater than right lower lobe predominant airspace disease  is similar.  IMPRESSION: Minimal improvement in congestive heart failure.  Otherwise, similar appearance of left greater than right pleural effusions and adjacent airspace disease. This could represent atelectasis or concurrent infection.   Electronically Signed   By: Abigail Miyamoto M.D.   On: 02/10/2014 09:10   Dg Chest 2 View  02/09/2014   CLINICAL DATA:  Cough and congestion.  Tracheostomy.  EXAM: CHEST  2 VIEW  COMPARISON:  02/04/2014.  FINDINGS: Tracheostomy tube and left IJ line in stable position. Interval removal of PICC line. Cardiomegaly with mild pulmonary venous congestion. Bilateral pulmonary alveolar infiltrates. Bilateral pleural effusions are noted. Large pleural effusion on the left is new from prior exam. Right pleural effusion is slightly smaller on today's exam . These findings all may be secondary to congestive heart failure. Bilateral pneumonia cannot be excluded. No pneumothorax.  IMPRESSION: 1. Interim removal of PICC line. Tracheostomy tube and left IJ line in stable position. 2. Cardiomegaly with pulmonary venous congestion bilateral pulmonary alveolar infiltrates and pleural effusions suggesting congestive heart failure. Bilateral pneumonia cannot be excluded. 3. The left pleural effusion is a new finding on today's exam. Left pleural effusion is large. Right pleural effusion persists. The right pleural effusion is slightly smaller on today's exam.   Electronically Signed   By: Armour   On: 02/09/2014 14:03     Ct Chest Wo Contrast  02/11/2014   CLINICAL DATA:  Exudative pleural effusion.  EXAM: CT CHEST WITHOUT CONTRAST  TECHNIQUE: Multidetector CT imaging of the chest was performed following the standard protocol without IV contrast.  COMPARISON:  Chest x-ray 02/10/2014.  Chest CT 09/14/2013  FINDINGS: There  is a large left pleural effusion and moderate right pleural effusion. Compressive atelectasis in the left lower lobe. Consolidation with air bronchograms noted in the  right lower lobe. Cannot exclude pneumonia. Patchy ground-glass airspace opacities throughout the remaining aerated lungs bilaterally, most pronounced in the right upper lobe.  Tracheostomy tube is in place. Borderline sized mediastinal lymph nodes, likely reactive.  Heart is normal size. Aorta is normal caliber. No acute findings in the upper abdomen.  No acute bony abnormality.  IMPRESSION: Large left pleural effusion and moderate right pleural effusion. Dense consolidation with air bronchograms in the right lower lobe concerning for pneumonia. Compressive atelectasis in the left lower lobe.  Patchy ground-glass opacities bilaterally, likely alveolitis.   Electronically Signed   By: Rolm Baptise M.D.   On: 02/11/2014 15:07    US Thoracentesis Asp Pleural Space W/img Guide  02/10/2014   INDICATION: Patient with prior history of pneumonia and bilateral pleural effusions, left greater than right. Request is made for therapeutic left thoracentesis.  EXAM: ULTRASOUND GUIDED THERAPEUTIC LEFT THORACENTESIS.  COMPARISON:  Prior thoracentesis on 02/04/2014  MEDICATIONS: None  COMPLICATIONS: None immediate  TECHNIQUE: Informed written consent was obtained from the patient after a discussion of the risks, benefits and alternatives to treatment. A timeout was performed prior to the initiation of the procedure.  Initial ultrasound scanning demonstrates a moderate to large left pleural effusion. The lower chest was prepped and draped in the usual sterile fashion. 1% lidocaine was used for local anesthesia.  An ultrasound image was saved for documentation purposes. An 8 Fr Safe-T-Centesis catheter was introduced. The thoracentesis was performed. The catheter was removed and a dressing was applied. The patient tolerated the procedure well without immediate post procedural complication. The patient was escorted to have an upright chest radiograph.  FINDINGS: A total of approximately 1.6 liters of slightly turbid, yellow fluid  was removed.  IMPRESSION: Successful ultrasound-guided therapeutic left sided thoracentesis yielding 1.6 liters of pleural fluid.  Read by: Rowe Robert, PA-C   Electronically Signed   By: Markus Daft M.D.   On: 02/10/2014 13:00    Labs:  Basic Metabolic Panel:  Recent Labs Lab 02/06/14 0445 02/09/14 0728 02/10/14 0902 02/12/14 0700 02/12/14 0959 02/12/14 1316  NA 138 145 144 136 141  --   K 3.6 4.1 4.3 5.0 4.8  --   CL 104 103 105 102 103  --   CO2 27 30 33* 30 31  --   GLUCOSE 103* 99 123* 96 110*  --   BUN 36* 28* 23 19 19   --   CREATININE 1.16 0.81 0.85 0.78 0.83 0.86  CALCIUM 9.5 9.1 9.0 8.5 8.5  --     CBC:  Recent Labs Lab 02/09/14 0728 02/10/14 0902 02/12/14 0700  WBC 19.7* 17.3* 14.3*  NEUTROABS 14.1* 12.4*  --   HGB 7.2* 7.3* 6.8*  HCT 22.6* 22.9* 21.3*  MCV 103.7* 102.7* 103.9*  PLT 498* 488* 389    CBG: No results for input(s): GLUCAP in the last 168 hours.  Brief HPI:   Allen Wood is a 44 y.o. Male with h/o alcohol & tobacco abuse, LLL PNA August 2015 who was admitted 01/15/2014 with productive cough, SOB, right back pain and fever due to RLL PNA with positive strep antigen and pneumococcal bacteremia. He was treated with broad spectrum antibiotics but progressive respiratory failure requiring intubation 01/16/14 . Hospital course complicated by seizures due to alcohol withdrawal and no antiepileptic needed. He was treated with high  dose thiamine due to concerns of Wernicke's encephalopathy. He had difficulty with vent wean and trach placed on 01/30/14 by CCM. GI consulted for input on heme+ stools with drop in Hgb to 6.4 as well as high gastric residual with inability to tolerate tube feeds. EGD revealed mild duodenitis and PEG was placed on 01/31/14  by Dr. Fuller Plan. He was transfused with 2 units PRBC. Acute renal failure has resolved and he has required bilateral thoracocentesis for treatment of pleural effusions. He was extubated to ATC with plans for  trach downsizing next week.  He has completed his antibiotic regimen but continued to have persistent leucocytosis.  He was afebrile and CCM recommended monitoring off antibiotics. Patient was noted to be significantly deconditioned with critical illness myopathy and CIR was recommended for follow up therapy.    Hospital Course: Allen Wood was admitted to rehab 02/08/2014 for inpatient therapies to consist of PT, ST and OT at least three hours five days a week. Past admission physiatrist, therapy team and rehab RN have worked together to provide customized collaborative inpatient rehab. He did spike temp of 101.3 on evening of admission and UA/UCS, sputum culture as well as CXR were ordered for work up.  PEG site was noted to be clean and dry without signs of infection. UA was negative but CXR revealed large left pleural effusion therefore IVR was consulted for thoracocentesis. Admission labs revealed rise in WBC to 19.7 but patient had defervesced and was stable clinically.  BLE dopplers were negative for DVT.  He was tolerating PMSV and FEES was done to evaluate swallow on 01/15. He was started on dysphagia 2, nectar liquids with full supervision for safety with meals.   Patient underwent U/S guided thoracocentesis of 1.6L  yellow exudative fluid on 02/10/14. He continued to have persistent low grade fever with leucocytosis and CT chest was done to rule out empyema. This revealed large left pleural effusion and moderate right pleural effusion in addition to RLE air bronchograms concerning for PNA. He develop hypoxia with minimal activity as well as lethargy on 02/12/14 am. CBC showed drop in  H/H to 6.8/21.3 and he was type/crossed for 2 units PRBC in anticipation of transfusion. CCM was contacted for assistance and patient was transferred to ICU for treatment of recurrent Left pleural effusion question empyema as well as fever due to HCAP.    Disposition:  Intensive Care Unit  Diet: NPO  Current  Medications:  . sodium chloride   Intravenous Once  . acetaminophen  650 mg Oral Once  . antiseptic oral rinse  7 mL Mouth Rinse QID  . chlorhexidine  15 mL Mouth Rinse BID  . clonazePAM  2 mg Per Tube BID  . cloNIDine  0.1 mg Transdermal Weekly  . feeding supplement (OSMOLITE 1.5 CAL)  350 mL Per Tube QID  . feeding supplement (PRO-STAT SUGAR FREE 64)  30 mL Per Tube Q1200  . fentaNYL  75 mcg Transdermal Q72H  . ferrous sulfate  300 mg Per Tube BID WC  . folic acid  1 mg Per Tube Daily  . free water  200 mL Per Tube 5 X Daily  . furosemide  20 mg Intravenous Once  . ipratropium-albuterol  3 mL Nebulization Q6H  . metoprolol tartrate  25 mg Per Tube BID  . multivitamin  5 mL Per Tube Daily  . neomycin-bacitracin-polymyxin  1 application Topical Daily  . nicotine  21 mg Transdermal Daily  . pantoprazole sodium  40 mg Per Tube  Daily  . sodium chloride  10-40 mL Intracatheter Q12H  . thiamine  100 mg Per Tube Daily    Follow-up Information    Follow up with Charlett Blake, MD.   Specialty:  Physical Medicine and Rehabilitation   Why:  As needed   Contact information:   [place] [address] [phone]       Signed: Bary Leriche 02/12/2014, 3:48 PM

## 2014-02-12 NOTE — Care Management Note (Addendum)
    Page 1 of 2   02/21/2014     4:17:32 PM CARE MANAGEMENT NOTE 02/21/2014  Patient:  Allen Wood, Allen Wood   Account Number:  [id number]  Date Initiated:  02/12/2014  Documentation initiated by:  Elissa Hefty  Subjective/Objective Assessment:   adm from cir w resp distress. has trach     Action/Plan:   was living at home prior to last hosp adm but was in cir between these hosp stays.  pt eval- rec CIR, CIR recs hh or snf. patient refusing snf wants [place] services.   Anticipated DC Date:  02/20/2014   Anticipated DC Plan:  IP REHAB FACILITY      DC Planning Services  CM consult      [facility] Choice  HOME HEALTH   Choice offered to / List presented to:  C-3 Spouse        HH arranged  HH-1 RN  [place]  HH-7 RESPIRATORY THERAPY      [place].   Status of service:  Completed, signed off Medicare Important Message given?  NO (If response is "NO", the following Medicare IM given date fields will be blank) Date Medicare IM given:   Medicare IM given by:   Date Additional Medicare IM given:   Additional Medicare IM given by:    Discharge Disposition:  [place]  Per UR Regulation:  Reviewed for med. necessity/level of care/duration of stay  If discussed at [place] of Stay Meetings, dates discussed:   02/20/2014    Comments:  02/21/14 [place], BSN [phone] NCM spoke with patient and wife, patient states he does not want to go to a snf he wants to go home with hh services and his wife states she wants him to go home with hh services.  They chose [facility] for [facility], PT, aide , social work ,and resp care.  NCM made referral to [facility], since patient has medicaid patient can have RN, aide , social work and resp care and the RN will be able to do a pt eval with patient one time.  This was explained to patient and wife. Soc will begin 24-48 hrs post dc.    Plan is for patient to go home tomrrow by car.  NCM asked patient if he needed a walker , he states he does not need a walker.  02/19/14-1700Marvetta Gibbons RN, BSN [phone] Pt had CT removed today, and decannuled from [facility], will need to check with CIR to see if they plan to re-admit pt vs pt returning home. Pt tx to 5w26.

## 2014-02-12 NOTE — Progress Notes (Signed)
SLP Cancellation Note  Patient Details Name: Allen Wood MRN: [id number] DOB: 09/06/1970   Late entry for Saturday 02/10/14  Cancelled treatment:        Patient missed 60 minutes of skilled SLP therapy ue to being off unit for a procedure.    Dictated by : Gunnar Fusi 02/12/2014, 1:09 PM  For : Adele Dan

## 2014-02-12 NOTE — H&P (Signed)
PULMONARY / CRITICAL CARE MEDICINE   Name: Allen Wood MRN: [id number] DOB: 1970-05-07    ADMISSION DATE:  (Not on file)  REFERRING MD :  Kirsteins (Rehab)   CHIEF COMPLAINT:  PNA, pleural effusion   INITIAL PRESENTATION: 44 yo male with hx ETOH initially admitted 12/21 with PNA and pneumococcal bacteremia.  He had progressive resp failure requiring intubation 12/22.  Course was c/b septic shock, seizures, ETOH w/d, anemia, pleural effusion and acute renal failure requiring CVVH.  Had difficulty with vent weaning and underwent trach placement 1/5.  He was ultimately weaned to [facility] and tx to inpt rehab 1/14.  Despite multiple thoracentesis he continues to have recurrent large L pleural effusion, now with small right effusion and probable HCAP with fever and leukocytosis and is tx back to ICU 1/18.    STUDIES:  CT chest 1/17>>>  Large left pleural effusion and moderate right pleural effusion. Dense consolidation with air bronchograms in the right lower lobe concerning for pneumonia. Compressive atelectasis in the left lower lobe.  Patchy ground-glass opacities bilaterally, likely alveolitis.  SIGNIFICANT EVENTS: 1/6 PEG placement (stark) 1/10 L thoracentesis >>1.2L 1/16 L thoracentesis >>> 1.6L   HISTORY OF PRESENT ILLNESS: 44 yo male with hx ETOH initially admitted 12/21 with PNA and pneumococcal bacteremia.  He had progressive resp failure requiring intubation 12/22.  Course was c/b septic shock, seizures, ETOH w/d, anemia, pleural effusion and acute renal failure requiring CVVH.  Had difficulty with vent weaning and underwent trach placement 1/5.  He was ultimately weaned to [facility] and tx to inpt rehab 1/14.  Despite multiple thoracentesis he continues to have recurrent large L pleural effusion, now with small right effusion and probable HCAP with fever and leukocytosis and is tx back to ICU 1/18.   Pt is awake and alert.  States he feels "ok right now" but intermittent malaise, fatigue, SOB.   Productive cough but difficult to expectorate out of trach.  Denies chest pain, hemoptysis, orthopnea, bloody stools.     PAST MEDICAL HISTORY :   has a past medical history of Alcohol abuse; Depression; Anxiety; and Pneumonia.  has past surgical history that includes Mouth surgery; Tracheostomy (01/30/14); Esophagogastroduodenoscopy (N/A, 02/03/2014); and PEG placement (N/A, 02/03/2014). Prior to Admission medications   Medication Sig Start Date End Date Taking? Authorizing Provider  azithromycin (ZITHROMAX) 500 MG tablet Take 1 tablet (500 mg total) by mouth daily. 09/15/13   [name], MD  buPROPion (WELLBUTRIN XL) 150 MG 24 hr tablet Take 150 mg by mouth every morning.    Historical Provider, MD  predniSONE (DELTASONE) 10 MG tablet Takes 6 tablets for 1 days, then 5 tablets for 1 days, then 4 tablets for 1 days, then 3 tablets for 1 days, then 2 tabs for 1 days, then 1 tab for 1 days, and then stop. 09/15/13   [name], MD  sertraline (ZOLOFT) 50 MG tablet Take 50 mg by mouth at bedtime.    Historical Provider, MD   No Known Allergies  FAMILY HISTORY:  has no family status information on file.  SOCIAL HISTORY:  reports that he has been smoking Cigarettes.  He has a 10 pack-year smoking history. He has never used smokeless tobacco. He reports that he drinks alcohol. He reports that he does not use illicit drugs.  REVIEW OF SYSTEMS:  As per HPI - All other systems reviewed and were neg.    SUBJECTIVE:   VITAL SIGNS: Temp:  [98.3 F (36.8 C)-100.8 F (38.2  C)] 99.5 F (37.5 C) (01/18 0500) Pulse Rate:  [50-102] 101 (01/18 0500) Resp:  [14-20] 18 (01/18 0500) BP: (110-120)/(66-75) 110/68 mmHg (01/18 0500) SpO2:  [90 %-97 %] 90 % (01/18 0800) FiO2 (%):  [35 %-40 %] 40 % (01/18 0811) Weight:  [156 lb 9.6 oz (71.033 kg)] 156 lb 9.6 oz (71.033 kg) (01/18 7035) HEMODYNAMICS:   VENTILATOR SETTINGS: Vent Mode:  [-]  FiO2 (%):  [35 %-40 %] 40 % INTAKE / OUTPUT: No  intake or output data in the 24 hours ending 02/12/14 1202  PHYSICAL EXAMINATION: General:  Chronically ill appearing young male, NAD in bed  Neuro: drowsy but easily arousable, MAE, gen weakness HEENT:  Mm dry, no JVD, trach c/d  Cardiovascular:  s1s2 rrr Lungs:  resps even, mildly labored intermittently on ATC 40%, diminished L, scattered rhonchi R Abdomen:  Soft, PEG Musculoskeletal:  Warm and dry, pale, no sig edema   LABS:  CBC  Recent Labs Lab 02/09/14 0728 02/10/14 0902 02/12/14 0700  WBC 19.7* 17.3* 14.3*  HGB 7.2* 7.3* 6.8*  HCT 22.6* 22.9* 21.3*  PLT 498* 488* 389   Coag's No results for input(s): APTT, INR in the last 168 hours. BMET  Recent Labs Lab 02/10/14 0902 02/12/14 0700 02/12/14 0959  NA 144 136 141  K 4.3 5.0 4.8  CL 105 102 103  CO2 33* 30 31  BUN 23 19 19   CREATININE 0.85 0.78 0.83  GLUCOSE 123* 96 110*   Electrolytes  Recent Labs Lab 02/10/14 0902 02/12/14 0700 02/12/14 0959  CALCIUM 9.0 8.5 8.5   Sepsis Markers  Recent Labs Lab 02/06/14 0424  LATICACIDVEN 0.40*   ABG No results for input(s): PHART, PCO2ART, PO2ART in the last 168 hours. Liver Enzymes  Recent Labs Lab 02/09/14 0728 02/12/14 0959  AST 18 20  ALT 13 17  ALKPHOS 46 46  BILITOT 0.4 0.3  ALBUMIN 1.8* 1.6*   Cardiac Enzymes No results for input(s): TROPONINI, PROBNP in the last 168 hours. Glucose No results for input(s): GLUCAP in the last 168 hours.  Imaging Ct Chest Wo Contrast  02/11/2014   CLINICAL DATA:  Exudative pleural effusion.  EXAM: CT CHEST WITHOUT CONTRAST  TECHNIQUE: Multidetector CT imaging of the chest was performed following the standard protocol without IV contrast.  COMPARISON:  Chest x-ray 02/10/2014.  Chest CT 09/14/2013  FINDINGS: There is a large left pleural effusion and moderate right pleural effusion. Compressive atelectasis in the left lower lobe. Consolidation with air bronchograms noted in the right lower lobe. Cannot exclude  pneumonia. Patchy ground-glass airspace opacities throughout the remaining aerated lungs bilaterally, most pronounced in the right upper lobe.  Tracheostomy tube is in place. Borderline sized mediastinal lymph nodes, likely reactive.  Heart is normal size. Aorta is normal caliber. No acute findings in the upper abdomen.  No acute bony abnormality.  IMPRESSION: Large left pleural effusion and moderate right pleural effusion. Dense consolidation with air bronchograms in the right lower lobe concerning for pneumonia. Compressive atelectasis in the left lower lobe.  Patchy ground-glass opacities bilaterally, likely alveolitis.   Electronically Signed   By: Rolm Baptise M.D.   On: 02/11/2014 15:07     ASSESSMENT / PLAN:  PULMONARY Trach (CCM) 1/5>>> Acute on chronic respiratory failure s/p trach - initially r/t pneumococcal CAP, bacteremia, septic shock c/b ETOH w/d and seizures.  Now with recurrent pleural effusion and ?HCAP.  Pleural effusion, ?empyema  HCAP  P:   Supplemental O2 via ATC  F/u  CXR  Will plan chest tube placement at bedside  abx as below  BD's  Smoking cessation  D/c nicotine patch for now   CARDIOVASCULAR Sepsis Tachycardia  HTN  P:  Cont po metoprolol  Hold clonidine for now with borderline BP  Check lactate, pct   RENAL Hypernatremia - improved  P:   Cont free water  F/u chem    GASTROINTESTINAL Dysphagia  Heme pos stools/ anemia - previously seen by GI.  EGD with only mild duodenitis.  Protein calorie malnutrition  P:   Cont feeds per PEG  PPI  Will need further outpt GI workup   HEMATOLOGIC Anemia  P:  F/u cbc  Transfuse for hgb <7 - 1 unit PRBC pending from rehab 1/18  INFECTIOUS HCAP with parapneumonia pleural effusion. P:   BCx2 1/18>>> UC 1/18>>> Sputum 1/18>>> Pleural fluid 1/18>>>  Vancomycin 1/18>>> Zosyn 1/18>>>  ENDOCRINE No active issue  P:   F/u glucose on chem   NEUROLOGIC ETOH withdrawal  Seizures - r/t above P:    Cont fentanyl duragesic  Cont clonazepam  Consider resume clonidine patch once BP improved  Cont thiamine, folate, mvi    FAMILY  - Updates:  Wife updated at bedside 1/18  - Inter-disciplinary family meet or Palliative Care meeting due by:  1/25    Nickolas Madrid, NP 02/12/2014  12:02 PM Pager: [phone] or [phone]  Reviewed above, examined.  He has progressive dyspnea and is requiring oxygen.  He has Lt > Rt pleuritic chest pain.  He has decreased breath sounds with rales on exam.  He has recurrent exudate pleural effusion, CT chest shows PNA.  He will need to transfer to ICU, start Abx for HCAP.  He will need chest tube placement for drainage of effusion.    Updated pts wife about plan.  Chesley Mires, MD [facility] Pulmonary/Critical Care 02/12/2014, 12:26 PM Pager:  [phone] After 3pm call: [phone]

## 2014-02-12 NOTE — Progress Notes (Signed)
44 y.o. Male with h/o alcohol & tobacco abuse, LLL PNA August 2015 who was admitted 01/15/2014 with productive cough, SOB, right back pain and fever due to RLL PNA with positive strep antigen and pneumococcal bacteremia. He was treated with broad spectrum antibiotics but progressive respiratory failure requiring intubation 01/16/14 . Neurology services consulted 01/18/2014 due to recurrent seizures and Cranial CT scan was negative. He was loaded with Keppra and EEG unremarkable. Seizures have resolved and  Dr. Doy Mince felt seizure were related to alcohol withdrawal and no antiepileptic needed. Treated with high dose thiamine due to concerns of Wernicke's encephalopathy.   He had difficulty with vent wean and trach placed on 01/30/13 by CCM.  GI consulted for input on heme+ stools with drop in Hgb to 6.4 as well as high gastric residual with inability to tolerate tube feeds. EGD revealed mild duodenitis and  PEG placed on 01/06 by Dr. Fuller Plan. He was transfused with 2 units PRBC.  Hospital course complicated by acute renal failure due to ATN with decrease in UOP requiring CVVHD to help with fluid overload. Has required pressors due to hypotensive shock. Echocardiogram completed with ejection fraction of 65% no wall motion abnormalities.  He tolerated extubation and is tolerating ATC as well as well as PMSV trials with ST. Plans for trach to be downsized next week as secretions improve. Blood pressures have been stable off pressors. He required left thoracocentesis of 1.2 L transudative fluid on 02/04/14. UOP has improved with improvement in renal status and nephrology has signed off. He has completed his antibiotic regimen with persistent leucocytosis noted. He is afebrile and CCM recommends monitoring off antibiotics. 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  Subjective/Complaints: CCM note appreciate Pt having abd pain  Resting comfortably in bed   HR to 110s and sats drop to <85% when going from sup to sitting at EOB  Review of Systems - cannot obtain, secondary to communication and mental status, very long delay in processing Objective: Vital Signs: Blood pressure 110/68, pulse 101, temperature 99.5 F (37.5 C), temperature source Oral, resp. rate 18, height 5' 11"  (1.803 m), weight 71.033 kg (156 lb 9.6 oz), SpO2 92 %. Dg Chest 1 View  02/10/2014   CLINICAL DATA:  Status post left thoracentesis.  EXAM: CHEST - 1 VIEW  COMPARISON:  02/09/2014.  FINDINGS: Little change in a large left pleural effusion. No pneumothorax. More discrete nodular density in the right mid lung zone, measuring 3.7 x 2.0 cm on the frontal view that had a more linear appearance with lucency previously, without the appearance of a discrete mass yesterday. No significant change in a small right pleural effusion and linear density at the right lung base. Diffuse osteopenia. Tracheostomy tube in satisfactory position. Stable left jugular catheter.  IMPRESSION: 1. No pneumothorax following left thoracentesis. 2. No significant change in bilateral pleural effusions. 3. Interval 3.7 x 2.0 cm mass like density in the right mid lung zone. This may represent loculated fluid in a fissure. A previously partially obscured mass is also a possibility. 4. No significant change in mild right basilar atelectasis.   Electronically Signed   By: Enrique Sack M.D.   On: 02/10/2014 13:43   Ct Chest Wo Contrast  02/11/2014   CLINICAL DATA:  Exudative pleural effusion.  EXAM: CT CHEST WITHOUT CONTRAST  TECHNIQUE: Multidetector CT imaging of the chest was performed following the standard protocol without IV contrast.  COMPARISON:  Chest x-ray 02/10/2014.  Chest  CT 09/14/2013  FINDINGS: There is a large left pleural effusion and moderate right pleural effusion. Compressive atelectasis in the left lower lobe. Consolidation with air bronchograms noted in the right lower lobe. Cannot exclude pneumonia.  Patchy ground-glass airspace opacities throughout the remaining aerated lungs bilaterally, most pronounced in the right upper lobe.  Tracheostomy tube is in place. Borderline sized mediastinal lymph nodes, likely reactive.  Heart is normal size. Aorta is normal caliber. No acute findings in the upper abdomen.  No acute bony abnormality.  IMPRESSION: Large left pleural effusion and moderate right pleural effusion. Dense consolidation with air bronchograms in the right lower lobe concerning for pneumonia. Compressive atelectasis in the left lower lobe.  Patchy ground-glass opacities bilaterally, likely alveolitis.   Electronically Signed   By: Rolm Baptise M.D.   On: 02/11/2014 15:07   US Thoracentesis Asp Pleural Space W/img Guide  02/10/2014   INDICATION: Patient with prior history of pneumonia and bilateral pleural effusions, left greater than right. Request is made for therapeutic left thoracentesis.  EXAM: ULTRASOUND GUIDED THERAPEUTIC LEFT THORACENTESIS.  COMPARISON:  Prior thoracentesis on 02/04/2014  MEDICATIONS: None  COMPLICATIONS: None immediate  TECHNIQUE: Informed written consent was obtained from the patient after a discussion of the risks, benefits and alternatives to treatment. A timeout was performed prior to the initiation of the procedure.  Initial ultrasound scanning demonstrates a moderate to large left pleural effusion. The lower chest was prepped and draped in the usual sterile fashion. 1% lidocaine was used for local anesthesia.  An ultrasound image was saved for documentation purposes. An 8 Fr Safe-T-Centesis catheter was introduced. The thoracentesis was performed. The catheter was removed and a dressing was applied. The patient tolerated the procedure well without immediate post procedural complication. The patient was escorted to have an upright chest radiograph.  FINDINGS: A total of approximately 1.6 liters of slightly turbid, yellow fluid was removed.  IMPRESSION: Successful  ultrasound-guided therapeutic left sided thoracentesis yielding 1.6 liters of pleural fluid.  Read by: Rowe Robert, PA-C   Electronically Signed   By: Markus Daft M.D.   On: 02/10/2014 13:00   Results for orders placed or performed during the hospital encounter of 02/08/14 (from the past 72 hour(s))  Culture, respiratory (NON-Expectorated)     Status: None   Collection Time: 02/09/14 10:17 AM  Result Value Ref Range   Specimen Description TRACHEAL ASPIRATE    Special Requests NONE    Gram Stain      NO WBC SEEN NO SQUAMOUS EPITHELIAL CELLS SEEN NO ORGANISMS SEEN Performed at [place] Performed at [facility]    Report Status 02/11/2014 FINAL   Vitamin B12     Status: None   Collection Time: 02/09/14 10:33 AM  Result Value Ref Range   Vitamin B-12 871 211 - 911 pg/mL    Comment: Performed at [facility], Urine     Status: None   Collection Time: 02/09/14 10:52 AM  Result Value Ref Range   Specimen Description URINE, RANDOM    Special Requests NONE    Colony Count NO GROWTH Performed at [facility]     Culture NO GROWTH Performed at [facility]     Report Status 02/11/2014 FINAL   Urinalysis, Routine w reflex microscopic     Status: Abnormal   Collection Time: 02/09/14 10:52 AM  Result Value Ref Range   Color, Urine  YELLOW YELLOW   APPearance CLEAR CLEAR   Specific Gravity, Urine 1.021 1.005 - 1.030   pH 5.5 5.0 - 8.0   Glucose, UA NEGATIVE NEGATIVE mg/dL   Hgb urine dipstick MODERATE (A) NEGATIVE   Bilirubin Urine NEGATIVE NEGATIVE   Ketones, ur NEGATIVE NEGATIVE mg/dL   Protein, ur 30 (A) NEGATIVE mg/dL   Urobilinogen, UA 0.2 0.0 - 1.0 mg/dL   Nitrite NEGATIVE NEGATIVE   Leukocytes, UA NEGATIVE NEGATIVE  Urine microscopic-add on     Status: Abnormal   Collection Time: 02/09/14 10:52 AM  Result Value Ref Range   Squamous Epithelial / LPF FEW (A) RARE   WBC, UA 3-6 <3  WBC/hpf   RBC / HPF 7-10 <3 RBC/hpf   Bacteria, UA FEW (A) RARE   Casts GRANULAR CAST (A) NEGATIVE  CBC with Differential     Status: Abnormal   Collection Time: 02/10/14  9:02 AM  Result Value Ref Range   WBC 17.3 (H) 4.0 - 10.5 K/uL   RBC 2.23 (L) 4.22 - 5.81 MIL/uL   Hemoglobin 7.3 (L) 13.0 - 17.0 g/dL   HCT 22.9 (L) 39.0 - 52.0 %   MCV 102.7 (H) 78.0 - 100.0 fL   MCH 32.7 26.0 - 34.0 pg   MCHC 31.9 30.0 - 36.0 g/dL   RDW 15.1 11.5 - 15.5 %   Platelets 488 (H) 150 - 400 K/uL   Neutrophils Relative % 71 43 - 77 %   Neutro Abs 12.4 (H) 1.7 - 7.7 K/uL   Lymphocytes Relative 14 12 - 46 %   Lymphs Abs 2.4 0.7 - 4.0 K/uL   Monocytes Relative 9 3 - 12 %   Monocytes Absolute 1.5 (H) 0.1 - 1.0 K/uL   Eosinophils Relative 6 (H) 0 - 5 %   Eosinophils Absolute 1.0 (H) 0.0 - 0.7 K/uL   Basophils Relative 0 0 - 1 %   Basophils Absolute 0.1 0.0 - 0.1 K/uL  Basic metabolic panel     Status: Abnormal   Collection Time: 02/10/14  9:02 AM  Result Value Ref Range   Sodium 144 135 - 145 mmol/L    Comment: Please note change in reference range.   Potassium 4.3 3.5 - 5.1 mmol/L    Comment: Please note change in reference range.   Chloride 105 96 - 112 mEq/L   CO2 33 (H) 19 - 32 mmol/L   Glucose, Bld 123 (H) 70 - 99 mg/dL   BUN 23 6 - 23 mg/dL   Creatinine, Ser 0.85 0.50 - 1.35 mg/dL   Calcium 9.0 8.4 - 10.5 mg/dL   GFR calc non Af Amer >90 >90 mL/min   GFR calc Af Amer >90 >90 mL/min    Comment: (NOTE) The eGFR has been calculated using the CKD EPI equation. This calculation has not been validated in all clinical situations. eGFR's persistently <90 mL/min signify possible Chronic Kidney Disease.    Anion gap 6 5 - 15     HEENT: trach with sputum Cardio: RRR and tachy Resp: diffuse rhonchi and decreased breath sounds at bases GI: BS negative, Tender and Distention Extremity:  No Edema Skin:   Wound C/D/I and PEG site Neuro: Lethargic and Abnormal Motor 4 minus/5 bilateral deltoids,  biceps, triceps, grip, hip extremities except ankle dorsal flexor plantar flexor Musc/Skel:  Other no joint effusions noted Gen NAD   Assessment/Plan: 1. Functional deficits secondary to critical illness myopathy which require 3+ hours per day of interdisciplinary therapy in a  comprehensive inpatient rehab setting. Physiatrist is providing close team supervision and 24 hour management of active medical problems listed below. Physiatrist and rehab team continue to assess barriers to discharge/monitor patient progress toward functional and medical goals. Not able to tolerate CIR due to pulmonary effusion plus minus empyema, also with symptomatic anemia requiring repeat transfusion. CCM reevaluating for transfer FIM: FIM - Bathing Bathing Steps Patient Completed: Chest, Right Arm, Left Arm, Abdomen, Front perineal area, Buttocks, Right upper leg, Left upper leg Bathing: 4: Min-Patient completes 8-9 46f10 parts or 75+ percent  FIM - Upper Body Dressing/Undressing Upper body dressing/undressing steps patient completed: Thread/unthread right sleeve of pullover shirt/dresss, Put head through opening of pull over shirt/dress, Thread/unthread left sleeve of pullover shirt/dress Upper body dressing/undressing: 4: Min-Patient completed 75 plus % of tasks FIM - Lower Body Dressing/Undressing Lower body dressing/undressing steps patient completed: Pull pants up/down Lower body dressing/undressing: 2: Max-Patient completed 25-49% of tasks  FIM - Toileting Toileting steps completed by patient: Adjust clothing prior to toileting, Adjust clothing after toileting Toileting Assistive Devices: Grab bar or rail for support Toileting: 3: Mod-Patient completed 2 of 3 steps  FIM - TRadio producerDevices: Grab bars Toilet Transfers: 4-To toilet/BSC: Min A (steadying Pt. > 75%)  FIM - BControl and instrumentation engineerDevices: Arm rests, WCopy 4:  Bed > Chair or W/C: Min A (steadying Pt. > 75%), 4: Chair or W/C > Bed: Min A (steadying Pt. > 75%)  FIM - Locomotion: Wheelchair Distance: 96 Locomotion: Wheelchair: 1: Total Assistance/staff pushes wheelchair (Pt<25%) FIM - Locomotion: Ambulation Locomotion: Ambulation Assistive Devices: WAdministratorAmbulation/Gait Assistance: 4: Min assist Locomotion: Ambulation: 0: Activity did not occur  Comprehension Comprehension Mode: Auditory Comprehension: 5-Understands basic 90% of the time/requires cueing < 10% of the time  Expression Expression Mode: Verbal Expression Assistive Devices: 6-Talk trach valve Expression: 5-Expresses basic 90% of the time/requires cueing < 10% of the time.  Social Interaction Social Interaction: 4-Interacts appropriately 75 - 89% of the time - Needs redirection for appropriate language or to initiate interaction.  Problem Solving Problem Solving: 5-Solves basic 90% of the time/requires cueing < 10% of the time  Memory Memory: 4-Recognizes or recalls 75 - 89% of the time/requires cueing 10 - 24% of the time  Medical Problem List and Plan: 1. Functional deficits secondary to Critical  Illness myopathy.   2.  DVT Prophylaxis/Anticoagulation: Mechanical: Sequential compression devices, below knee Bilateral lower extremities 3. Pain Management: Continue fentanyl 75 mcg/hr and wean over the next few days.   4. Anxiety disorder/Mood: Was on Wellbutrin and Zoloft at home. Continue Klonopin bid for now. May need to resume zoloft. LCSW to follow for evaluation and support.   5. Neuropsych: This patient is capable of making decisions on his own behalf. 6. Skin/Wound Care:  Routine pressure relief measures. Maintain adequate nutritional and hydration status.   7. Fluids/Electrolytes/Nutrition:  Continue tube feeds. Add water boluses for hydration.   8. Dysphagia: Continue TF for now. Question FEES soon as able to tolerate PMSV with good breath support.   9.   VDRF: Continue nebs every 6 hours to help mobilize thick secretions. 10. Resting Tachycardia: Due to deconditioning--on metoprolol 25 mg bid to help maintain HR < 115 bpm   11. Anemia: Will need colonoscopy on outpatient basis. Monitor H/H with transfusion prn drop in Hgb <7.0.   12. Acute renal failure: Resolving with steady downward trend in Cr. Now with dehydration with rise  BUN - 36. Addition of water flushes should help with this.  LOS (Days) 4 A FACE TO FACE EVALUATION WAS PERFORMED  Naoma Boxell E 02/12/2014, 7:59 AM

## 2014-02-12 NOTE — Progress Notes (Signed)
Patient ID: Allen Wood, male   DOB: Apr 21, 1970, 44 y.o.   MRN: [id number]  [place] [facility]     PROGRESS NOTE    Subjective/Complaints: Sleeping, awakens to voice. Desat to 75% when doing sup/sit  Abd pain . Remainder of ROS limited by trach and cognitive status  Objective: Vital Signs: Blood pressure 110/68, pulse 101, temperature 99.5 F (37.5 C), temperature source Oral, resp. rate 18, height 5\' 11"  (1.803 m), weight 71.033 kg (156 lb 9.6 oz), SpO2 92 %. Dg Chest 1 View  02/10/2014   CLINICAL DATA:  Status post left thoracentesis.  EXAM: CHEST - 1 VIEW  COMPARISON:  02/09/2014.  FINDINGS: Little change in a large left pleural effusion. No pneumothorax. More discrete nodular density in the right mid lung zone, measuring 3.7 x 2.0 cm on the frontal view that had a more linear appearance with lucency previously, without the appearance of a discrete mass yesterday. No significant change in a small right pleural effusion and linear density at the right lung base. Diffuse osteopenia. Tracheostomy tube in satisfactory position. Stable left jugular catheter.  IMPRESSION: 1. No pneumothorax following left thoracentesis. 2. No significant change in bilateral pleural effusions. 3. Interval 3.7 x 2.0 cm mass like density in the right mid lung zone. This may represent loculated fluid in a fissure. A previously partially obscured mass is also a possibility. 4. No significant change in mild right basilar atelectasis.   Electronically Signed   By: Enrique Sack M.D.   On: 02/10/2014 13:43   Ct Chest Wo Contrast  02/11/2014   CLINICAL DATA:  Exudative pleural effusion.  EXAM: CT CHEST WITHOUT CONTRAST  TECHNIQUE: Multidetector CT imaging of the chest was performed following the standard protocol without IV contrast.  COMPARISON:  Chest x-ray 02/10/2014.  Chest CT 09/14/2013  FINDINGS: There is a large left pleural effusion and moderate right pleural effusion. Compressive  atelectasis in the left lower lobe. Consolidation with air bronchograms noted in the right lower lobe. Cannot exclude pneumonia. Patchy ground-glass airspace opacities throughout the remaining aerated lungs bilaterally, most pronounced in the right upper lobe.  Tracheostomy tube is in place. Borderline sized mediastinal lymph nodes, likely reactive.  Heart is normal size. Aorta is normal caliber. No acute findings in the upper abdomen.  No acute bony abnormality.  IMPRESSION: Large left pleural effusion and moderate right pleural effusion. Dense consolidation with air bronchograms in the right lower lobe concerning for pneumonia. Compressive atelectasis in the left lower lobe.  Patchy ground-glass opacities bilaterally, likely alveolitis.   Electronically Signed   By: Rolm Baptise M.D.   On: 02/11/2014 15:07   US Thoracentesis Asp Pleural Space W/img Guide  02/10/2014   INDICATION: Patient with prior history of pneumonia and bilateral pleural effusions, left greater than right. Request is made for therapeutic left thoracentesis.  EXAM: ULTRASOUND GUIDED THERAPEUTIC LEFT THORACENTESIS.  COMPARISON:  Prior thoracentesis on 02/04/2014  MEDICATIONS: None  COMPLICATIONS: None immediate  TECHNIQUE: Informed written consent was obtained from the patient after a discussion of the risks, benefits and alternatives to treatment. A timeout was performed prior to the initiation of the procedure.  Initial ultrasound scanning demonstrates a moderate to large left pleural effusion. The lower chest was prepped and draped in the usual sterile fashion. 1% lidocaine was used for local anesthesia.  An ultrasound image was saved for documentation purposes. An 8 Fr Safe-T-Centesis catheter was introduced. The thoracentesis was performed. The catheter was removed and a dressing was  applied. The patient tolerated the procedure well without immediate post procedural complication. The patient was escorted to have an upright chest  radiograph.  FINDINGS: A total of approximately 1.6 liters of slightly turbid, yellow fluid was removed.  IMPRESSION: Successful ultrasound-guided therapeutic left sided thoracentesis yielding 1.6 liters of pleural fluid.  Read by: Rowe Robert, PA-C   Electronically Signed   By: Markus Daft M.D.   On: 02/10/2014 13:00    Recent Labs  02/10/14 0902  WBC 17.3*  HGB 7.3*  HCT 22.9*  PLT 488*    Recent Labs  02/10/14 0902  NA 144  K 4.3  CL 105  GLUCOSE 123*  BUN 23  CREATININE 0.85  CALCIUM 9.0   CBG (last 3)  No results for input(s): GLUCAP in the last 72 hours.  Wt Readings from Last 3 Encounters:  02/12/14 71.033 kg (156 lb 9.6 oz)  02/03/14 75.2 kg (165 lb 12.6 oz)  09/14/13 65.726 kg (144 lb 14.4 oz)    Physical Exam:  Constitutional: He appears well-developed. He has a sickly appearance.   HENT:  Head: Normocephalic and atraumatic.   Eyes: Conjunctivae are normal. Pupils are equal, round, and reactive to light.  Neck:  # 8 cuffed trach in place with frequent   secretions. Diateck cath left neck.  Cardiovascular: Regular rhythm. Tachycardia present.  HR 100's  Respiratory: Effort normal. He has diffuse rhonchi. Very wet sounding especially in upper airways Occasional productive coughing GI: Normal appearance and bowel sounds are normal. Distention: mild distension. There is tenderness.  PEG site tender but clean and dry.  Genitourinary:  Foley  in place.  Musculoskeletal: He exhibits no edema or tenderness.  Neurological: He is alert.  Able to phonate occasionally. Moves all four.distracted.  Motor strength is 4-/5 bilateral deltoid,, bicep tricep, 5 minus grip, 3/5 hip flexors 4 minus knee extensors 4+ ankle dorsiflexors Withdraws to pain on all 4's Psych: restless  Assessment/Plan: 1. Functional deficits secondary to critical illness myopathy  Not able to tolerate CIR at this time secondary to recurrent large effusions with probable recurrent  HCAP, also drop in Hgb requiring transfusion, suspect recurrent GI bleed , at minimum will need Step down unit .  Discussed situation with wife   FIM: FIM - Bathing Bathing Steps Patient Completed: Chest, Right Arm, Left Arm, Abdomen, Front perineal area, Buttocks, Right upper leg, Left upper leg Bathing: 4: Min-Patient completes 8-9 69f 10 parts or 75+ percent  FIM - Upper Body Dressing/Undressing Upper body dressing/undressing steps patient completed: Thread/unthread right sleeve of pullover shirt/dresss, Put head through opening of pull over shirt/dress, Thread/unthread left sleeve of pullover shirt/dress Upper body dressing/undressing: 4: Min-Patient completed 75 plus % of tasks FIM - Lower Body Dressing/Undressing Lower body dressing/undressing steps patient completed: Pull pants up/down Lower body dressing/undressing: 2: Max-Patient completed 25-49% of tasks  FIM - Toileting Toileting steps completed by patient: Adjust clothing prior to toileting, Adjust clothing after toileting Toileting Assistive Devices: Grab bar or rail for support Toileting: 3: Mod-Patient completed 2 of 3 steps  FIM - Radio producer Devices: Grab bars Toilet Transfers: 4-To toilet/BSC: Min A (steadying Pt. > 75%)  FIM - Control and instrumentation engineer Devices: Arm rests, Copy: 4: Bed > Chair or W/C: Min A (steadying Pt. > 75%), 4: Chair or W/C > Bed: Min A (steadying Pt. > 75%)  FIM - Locomotion: Wheelchair Distance: 96 Locomotion: Wheelchair: 1: Total Assistance/staff pushes wheelchair (Pt<25%) FIM - Locomotion:  Ambulation Locomotion: Ambulation Assistive Devices: Administrator Ambulation/Gait Assistance: 4: Min assist Locomotion: Ambulation: 0: Activity did not occur  Comprehension Comprehension Mode: Auditory Comprehension: 5-Understands basic 90% of the time/requires cueing < 10% of the time  Expression Expression Mode:  Verbal Expression Assistive Devices: 6-Talk trach valve Expression: 5-Expresses basic 90% of the time/requires cueing < 10% of the time.  Social Interaction Social Interaction: 4-Interacts appropriately 75 - 89% of the time - Needs redirection for appropriate language or to initiate interaction.  Problem Solving Problem Solving: 5-Solves basic 90% of the time/requires cueing < 10% of the time  Memory Memory: 4-Recognizes or recalls 75 - 89% of the time/requires cueing 10 - 24% of the time  Medical Problem List and Plan: 1. Functional deficits secondary to Critical Illness myopathy.  2. DVT Prophylaxis/Anticoagulation: Mechanical: Sequential compression devices, below knee Bilateral lower extremities 3. Pain Management: Continue fentanyl 75 mcg/hr and wean over the next few days.  4. Anxiety disorder/Mood: Was on Wellbutrin and Zoloft at home. Continue Klonopin bid for now. May need to resume zoloft. LCSW to follow for evaluation and support.  5. Neuropsych: This patient is capable of making decisions on his own behalf. 6. Skin/Wound Care: Routine pressure relief measures. Maintain adequate nutritional and hydration status.  7. Fluids/Electrolytes/Nutrition: Continue tube feeds. Add water boluses for hydration.  8. Dysphagia: Continue TF for now.D/C po feeds given medical status Question FEES soon as able to tolerate PMSV with good breath support.  9. VDRF: Continue nebs every 6 hours to help mobilize thick secretions. 10. Resting Tachycardia: Due to deconditioning--on metoprolol 25 mg bid to help maintain HR < 115 bpm  11. Anemia: Will need colonoscopy on outpatient basis.transfusion for drop in Hgb <7.0.  12. Acute renal failure: Resolving with steady downward trend in Cr. Now with dehydration with rise BUN - 36. Addition of water flushes should help with this.  13. Fever: elevated WBC prob recurrent PNA, hold po feed cont TF - no areas of skin breakdown  LOS (Days) 4 A  FACE TO FACE EVALUATION WAS PERFORMED  Charlett Blake 02/12/2014 7:48 AM

## 2014-02-12 NOTE — Progress Notes (Signed)
ANTIBIOTIC CONSULT NOTE - INITIAL  Pharmacy Consult for vancomycin/zosyn Indication: PNA  No Known Allergies  Patient Measurements: Wt= 71kg  Vital Signs: Temp: 99.5 F (37.5 C) (01/18 0500) Temp Source: Oral (01/18 0500) BP: 110/68 mmHg (01/18 0500) Pulse Rate: 101 (01/18 0500) Intake/Output from previous day:   Intake/Output from this shift:    Labs:  Recent Labs  02/10/14 0902 02/12/14 0700 02/12/14 0959  WBC 17.3* 14.3*  --   HGB 7.3* 6.8*  --   PLT 488* 389  --   CREATININE 0.85 0.78 0.83   Estimated Creatinine Clearance: 112.9 mL/min (by C-G formula based on Cr of 0.83). No results for input(s): VANCOTROUGH, VANCOPEAK, VANCORANDOM, GENTTROUGH, GENTPEAK, GENTRANDOM, TOBRATROUGH, TOBRAPEAK, TOBRARND, AMIKACINPEAK, AMIKACINTROU, AMIKACIN in the last 72 hours.   Microbiology: Recent Results (from the past 720 hour(s))  Blood culture (routine x 2)     Status: None   Collection Time: 01/15/14  4:09 PM  Result Value Ref Range Status   Specimen Description BLOOD RIGHT ANTECUBITAL  Final   Special Requests BOTTLES DRAWN AEROBIC AND ANAEROBIC 3 ML  Final   Culture  Setup Time   Final    01/16/2014 01:05 Performed at [facility]    Culture   Final    STREPTOCOCCUS PNEUMONIAE Note: Gram Stain Report Called to,Read Back By and Verified With: ANGELA ASHLEY 01/16/14 1550 BY SMITHERSJ Performed at [facility]    Report Status 01/30/2014 FINAL  Final   Organism ID, Bacteria STREPTOCOCCUS PNEUMONIAE  Final      Susceptibility   Streptococcus pneumoniae - MIC (ETEST)*    CEFTRIAXONE 0.023 SENSITIVE Sensitive     LEVOFLOXACIN 0.75 SENSITIVE Sensitive     PENICILLIN 0.023 SENSITIVE Sensitive     * STREPTOCOCCUS PNEUMONIAE  Blood culture (routine x 2)     Status: None   Collection Time: 01/15/14  4:09 PM  Result Value Ref Range Status   Specimen Description BLOOD BLOOD LEFT FOREARM  Final   Special Requests BOTTLES DRAWN AEROBIC AND ANAEROBIC 5 ML   Final   Culture  Setup Time   Final    01/16/2014 01:05 Performed at [facility]    Culture   Final    STREPTOCOCCUS PNEUMONIAE Note: SUSCEPTIBILITIES PERFORMED ON PREVIOUS CULTURE WITHIN THE LAST 5 DAYS. Note: Gram Stain Report Called to,Read Back By and Verified With: ANGELA ASHLEY 01/16/14 1550 BY SMITHERSJ Performed at [facility]    Report Status 01/30/2014 FINAL  Final  MRSA PCR Screening     Status: None   Collection Time: 01/15/14  8:24 PM  Result Value Ref Range Status   MRSA by PCR NEGATIVE NEGATIVE Final    Comment:        The GeneXpert MRSA Assay (FDA approved for NASAL specimens only), is one component of a comprehensive MRSA colonization surveillance program. It is not intended to diagnose MRSA infection nor to guide or monitor treatment for MRSA infections.   Culture, sputum-assessment     Status: None   Collection Time: 01/15/14  8:40 PM  Result Value Ref Range Status   Specimen Description SPUTUM  Final   Special Requests NONE  Final   Sputum evaluation   Final    THIS SPECIMEN IS ACCEPTABLE. RESPIRATORY CULTURE REPORT TO FOLLOW.   Report Status 01/15/2014 FINAL  Final  Culture, respiratory (NON-Expectorated)     Status: None   Collection Time: 01/15/14  8:40 PM  Result Value Ref Range Status   Specimen Description  SPUTUM  Final   Special Requests NONE  Final   Gram Stain   Final    FEW WBC PRESENT,BOTH PMN AND MONONUCLEAR RARE SQUAMOUS EPITHELIAL CELLS PRESENT FEW GRAM POSITIVE COCCI IN PAIRS Performed at [facility]    Culture   Final    MODERATE STREPTOCOCCUS PNEUMONIAE Performed at [facility]    Report Status 01/19/2014 FINAL  Final   Organism ID, Bacteria STREPTOCOCCUS PNEUMONIAE  Final      Susceptibility   Streptococcus pneumoniae - MIC (ETEST)*    CEFTRIAXONE 0.016 SENSITIVE Sensitive     LEVOFLOXACIN 0.75 SENSITIVE Sensitive     PENICILLIN 0.023 SENSITIVE Sensitive     * MODERATE STREPTOCOCCUS  PNEUMONIAE  Culture, respiratory (NON-Expectorated)     Status: None   Collection Time: 01/16/14 10:11 AM  Result Value Ref Range Status   Specimen Description TRACHEAL ASPIRATE  Final   Special Requests Normal  Final   Gram Stain   Final    MODERATE WBC PRESENT, PREDOMINANTLY PMN NO SQUAMOUS EPITHELIAL CELLS SEEN FEW GRAM NEGATIVE COCCOBACILLI Performed at [facility]    Culture   Final    MODERATE STREPTOCOCCUS PNEUMONIAE Performed at [facility]    Report Status 01/20/2014 FINAL  Final   Organism ID, Bacteria STREPTOCOCCUS PNEUMONIAE  Final      Susceptibility   Streptococcus pneumoniae - MIC (ETEST)*    CEFTRIAXONE 0.023 SENSITIVE Sensitive     LEVOFLOXACIN 1.0 SENSITIVE Sensitive     PENICILLIN 0.023 SENSITIVE Sensitive     * MODERATE STREPTOCOCCUS PNEUMONIAE  Culture, Urine     Status: None   Collection Time: 01/16/14 10:48 PM  Result Value Ref Range Status   Specimen Description URINE, CATHETERIZED  Final   Special Requests NONE  Final   Culture  Setup Time   Final    01/17/2014 11:37 Performed at [place] Performed at [facility]   Final   Culture NO GROWTH Performed at [facility]   Final   Report Status 01/18/2014 FINAL  Final  Body fluid culture     Status: None   Collection Time: 01/23/14 12:14 PM  Result Value Ref Range Status   Specimen Description PLEURAL RIGHT  Final   Special Requests NONE  Final   Gram Stain   Final    CYTOSPIN SLIDE WBC PRESENT,BOTH PMN AND MONONUCLEAR NO ORGANISMS SEEN Performed at [facility]    Culture   Final    NO GROWTH 3 DAYS Performed at [facility]    Report Status 01/28/2014 FINAL  Final  AFB culture with smear     Status: None (Preliminary result)   Collection Time: 01/23/14 12:14 PM  Result Value Ref Range Status   Specimen Description PLEURAL RIGHT  Final   Special Requests NONE  Final   Acid Fast Smear   Final    NO  ACID FAST BACILLI SEEN Performed at [facility]    Culture   Final    CULTURE WILL BE EXAMINED FOR 6 WEEKS BEFORE ISSUING A FINAL REPORT Performed at [facility]    Report Status PENDING  Incomplete  Fungus Culture with Smear     Status: None (Preliminary result)   Collection Time: 01/23/14 12:14 PM  Result Value Ref Range Status   Specimen Description PLEURAL RIGHT  Final   Special Requests NONE  Final   Fungal Smear   Final  NO YEAST OR FUNGAL ELEMENTS SEEN Performed at [facility]    Culture   Final    CULTURE IN PROGRESS FOR FOUR WEEKS Performed at [facility]    Report Status PENDING  Incomplete  Culture, blood (routine x 2)     Status: None   Collection Time: 01/24/14 10:00 AM  Result Value Ref Range Status   Specimen Description BLOOD LEFT WRIST  Final   Special Requests BOTTLES DRAWN AEROBIC ONLY 6CC  Final   Culture   Final    NO GROWTH 5 DAYS Performed at [facility]    Report Status 01/30/2014 FINAL  Final  Culture, blood (routine x 2)     Status: None   Collection Time: 01/24/14 10:10 AM  Result Value Ref Range Status   Specimen Description BLOOD RIGHT ARM  Final   Special Requests BOTTLES DRAWN AEROBIC AND ANAEROBIC 10CC  Final   Culture   Final    NO GROWTH 5 DAYS Note: Culture results may be compromised due to an inadequate volume of blood received in culture bottles. Performed at [facility]    Report Status 01/30/2014 FINAL  Final  Urine culture     Status: None   Collection Time: 01/24/14  5:04 PM  Result Value Ref Range Status   Specimen Description URINE, CATHETERIZED  Final   Special Requests NONE  Final   Colony Count NO GROWTH Performed at [facility]   Final   Culture NO GROWTH Performed at [facility]   Final   Report Status 01/28/2014 FINAL  Final  Culture, respiratory (NON-Expectorated)     Status: None   Collection Time: 01/25/14 12:11 AM  Result Value Ref  Range Status   Specimen Description TRACHEAL ASPIRATE  Final   Special Requests NONE  Final   Gram Stain   Final    ABUNDANT WBC PRESENT,BOTH PMN AND MONONUCLEAR RARE SQUAMOUS EPITHELIAL CELLS PRESENT NO ORGANISMS SEEN Performed at [facility]    Culture   Final    Non-Pathogenic Oropharyngeal-type Flora Isolated. Performed at [facility]    Report Status 01/28/2014 FINAL  Final  Culture, blood (routine x 2)     Status: None   Collection Time: 01/31/14  9:26 AM  Result Value Ref Range Status   Specimen Description BLOOD RIGHT ARM  Final   Special Requests BOTTLES DRAWN AEROBIC ONLY 4CC  Final   Culture   Final    NO GROWTH 5 DAYS Performed at [facility]    Report Status 02/06/2014 FINAL  Final  Culture, blood (routine x 2)     Status: None   Collection Time: 01/31/14  9:44 AM  Result Value Ref Range Status   Specimen Description BLOOD RIGHT HAND  Final   Special Requests BOTTLES DRAWN AEROBIC ONLY 5CC  Final   Culture   Final    NO GROWTH 5 DAYS Performed at [facility]    Report Status 02/06/2014 FINAL  Final  Clostridium Difficile by PCR     Status: None   Collection Time: 01/31/14 11:27 AM  Result Value Ref Range Status   C difficile by pcr NEGATIVE NEGATIVE Final  Urine culture     Status: None   Collection Time: 02/01/14  9:32 AM  Result Value Ref Range Status   Specimen Description URINE, CATHETERIZED  Final   Special Requests NONE  Final   Colony Count NO GROWTH Performed at [facility]  Final   Culture NO GROWTH Performed at [facility]   Final   Report Status 02/02/2014 FINAL  Final  Body fluid culture     Status: None   Collection Time: 02/04/14 12:22 PM  Result Value Ref Range Status   Specimen Description PLEURAL FLUID LEFT  Final   Special Requests NONE  Final   Gram Stain   Final    FEW WBC PRESENT,BOTH PMN AND MONONUCLEAR NO ORGANISMS SEEN Performed at [facility]    Culture    Final    NO GROWTH 3 DAYS Performed at [facility]    Report Status 02/08/2014 FINAL  Final  Culture, respiratory (NON-Expectorated)     Status: None   Collection Time: 02/09/14 10:17 AM  Result Value Ref Range Status   Specimen Description TRACHEAL ASPIRATE  Final   Special Requests NONE  Final   Gram Stain   Final    NO WBC SEEN NO SQUAMOUS EPITHELIAL CELLS SEEN NO ORGANISMS SEEN Performed at [facility]    Culture   Final    RARE CANDIDA ALBICANS Performed at [facility]    Report Status 02/11/2014 FINAL  Final  Culture, Urine     Status: None   Collection Time: 02/09/14 10:52 AM  Result Value Ref Range Status   Specimen Description URINE, RANDOM  Final   Special Requests NONE  Final   Colony Count NO GROWTH Performed at [facility]   Final   Culture NO GROWTH Performed at [facility]   Final   Report Status 02/11/2014 FINAL  Final    Medical History: Past Medical History  Diagnosis Date  . Alcohol abuse   . Depression   . Anxiety   . Pneumonia     Medications:  Scheduled:  . antiseptic oral rinse  7 mL Mouth Rinse QID  . chlorhexidine  15 mL Mouth Rinse BID  . clonazePAM  2 mg Per Tube BID  . feeding supplement (OSMOLITE 1.5 CAL)  350 mL Per Tube QID  . feeding supplement (PRO-STAT SUGAR FREE 64)  30 mL Per Tube Q1200  . [START ON 02/13/2014] fentaNYL  75 mcg Transdermal Q72H  . ferrous sulfate  300 mg Per Tube BID WC  . [START ON 2/99/3716] folic acid  1 mg Per Tube Daily  . free water  200 mL Per Tube 5 X Daily  . heparin  5,000 Units Subcutaneous 3 times per day  . ipratropium-albuterol  3 mL Nebulization Q6H  . metoprolol tartrate  25 mg Per Tube BID  . [START ON 02/13/2014] multivitamin  5 mL Per Tube Daily  . [START ON 02/13/2014] pantoprazole sodium  40 mg Per Tube Daily  . piperacillin-tazobactam  3.375 g Intravenous Once  . piperacillin-tazobactam (ZOSYN)  IV  3.375 g Intravenous Q8H  . sodium  chloride  10-40 mL Intracatheter Q12H  . [START ON 02/13/2014] thiamine  100 mg Per Tube Daily   Assessment: 44 yo male probable HCAP and noted with recent Pneumococcal PNA (trach aspirate culture on 12/22 was pan-S). He is to begin vancomycin and zosyn. WBC= 100.8, tmax= 100.8, SCr= 0.83 and CrCl > 100 ( noted with recent ARF requiring CVVH).  1/18 zosyn>> 1/18 vanc>>  1/18 resp 1/18 blood x2  Antibiotic history -Rocephin12/21>>>12/30. -Azithro, 12/21>>12/25 - Vancomycin 12/30>>>12/31, 1/6>> - Zosyn 12/30>>>1/4 - Ceftazidime 1/6>> 01/09  Goal of Therapy:  Vancomycin trough level 15-20 mcg/ml  Plan:  -Zosyn 3.375gm IV q8h -Vancomycin  1250mg  IV x1 followed by 750mg  IV q8h  Hildred Laser, Pharm D 02/12/2014 12:12 PM

## 2014-02-13 ENCOUNTER — Inpatient Hospital Stay (HOSPITAL_COMMUNITY): Payer: Medicaid Other

## 2014-02-13 DIAGNOSIS — J948 Other specified pleural conditions: Secondary | ICD-10-CM

## 2014-02-13 LAB — CBC
HCT: 23.7 % — ABNORMAL LOW (ref 39.0–52.0)
Hemoglobin: 7.9 g/dL — ABNORMAL LOW (ref 13.0–17.0)
MCH: 32.6 pg (ref 26.0–34.0)
MCHC: 33.3 g/dL (ref 30.0–36.0)
MCV: 97.9 fL (ref 78.0–100.0)
Platelets: 375 10*3/uL (ref 150–400)
RBC: 2.42 MIL/uL — AB (ref 4.22–5.81)
RDW: 16 % — ABNORMAL HIGH (ref 11.5–15.5)
WBC: 14.8 10*3/uL — ABNORMAL HIGH (ref 4.0–10.5)

## 2014-02-13 LAB — BASIC METABOLIC PANEL
ANION GAP: 7 (ref 5–15)
BUN: 15 mg/dL (ref 6–23)
CALCIUM: 8.4 mg/dL (ref 8.4–10.5)
CO2: 24 mmol/L (ref 19–32)
Chloride: 105 mEq/L (ref 96–112)
Creatinine, Ser: 0.82 mg/dL (ref 0.50–1.35)
GFR calc Af Amer: >90 mL/min (ref >90)
GFR calc non Af Amer: >90 mL/min (ref >90)
Glucose, Bld: 90 mg/dL (ref 70–99)
Potassium: 4.8 mmol/L (ref 3.5–5.1)
Sodium: 136 mmol/L (ref 135–145)

## 2014-02-13 MED ORDER — FENTANYL 25 MCG/HR TD PT72
75.0000 ug | MEDICATED_PATCH | TRANSDERMAL | Status: DC
  Administered 2014-02-13 – 2014-02-16 (×2): 75 ug via TRANSDERMAL
  Filled 2014-02-13 (×2): qty 3

## 2014-02-13 MED ORDER — CHLORHEXIDINE GLUCONATE 0.12 % MT SOLN
OROMUCOSAL | Status: AC
  Administered 2014-02-13: 15 mL via OROMUCOSAL
  Filled 2014-02-13: qty 15

## 2014-02-13 MED ORDER — CHLORHEXIDINE GLUCONATE 0.12 % MT SOLN
OROMUCOSAL | Status: AC
  Filled 2014-02-13: qty 15

## 2014-02-13 MED ORDER — IPRATROPIUM-ALBUTEROL 0.5-2.5 (3) MG/3ML IN SOLN
3.0000 mL | Freq: Four times a day (QID) | RESPIRATORY_TRACT | Status: DC
  Administered 2014-02-13 – 2014-02-19 (×24): 3 mL via RESPIRATORY_TRACT
  Filled 2014-02-13 (×24): qty 3

## 2014-02-13 NOTE — Evaluation (Signed)
Passy-Muir Speaking Valve - Evaluation Patient Details  Name: Allen Wood MRN: [id number] Date of Birth: 02-Dec-1970  Today's Date: 02/13/2014 Time: 0900-0930 SLP Time Calculation (min) (ACUTE ONLY): 30 min  Past Medical History:  Past Medical History  Diagnosis Date  . Alcohol abuse   . Depression   . Anxiety   . Pneumonia    Past Surgical History:  Past Surgical History  Procedure Laterality Date  . Mouth surgery      teeth removed.  . Tracheostomy  01/30/14    feinstein  . Esophagogastroduodenoscopy N/A 02/03/2014    Procedure: ESOPHAGOGASTRODUODENOSCOPY (EGD);  Surgeon: Ladene Artist, MD;  Location: [facility] ENDOSCOPY;  Service: Endoscopy;  Laterality: N/A;  bedside/trach/vent  . Peg placement N/A 02/03/2014    Procedure: PERCUTANEOUS ENDOSCOPIC GASTROSTOMY (PEG) PLACEMENT;  Surgeon: Ladene Artist, MD;  Location: [facility] ENDOSCOPY;  Service: Endoscopy;  Laterality: N/A;   HPI:  44 yo male with hx ETOH initially admitted 12/21 with PNA and pneumococcal bacteremia. He had progressive resp failure requiring intubation 12/22. Course was c/b septic shock, seizures, ETOH w/d, anemia, pleural effusion and acute renal failure requiring CVVH. Had difficulty with vent weaning and underwent trach placement 1/5. He was ultimately weaned to [facility] and tx to inpt rehab 1/14. Pt was able to toelrate PMSV with Shiley #8 cuffed for 40-60 minute intervals. He was consuming dys 2 diet with nectar thick liquids Despite multiple thoracentesis he continues to have recurrent large L pleural effusion, now with small right effusion and probable HCAP with fever and leukocytosis and is tx back to ICU 1/18.Current CT shows Large left pleural effusion and moderate right pleural effusion. Dense consolidation with air bronchograms in the right lower lobe concerning for pneumonia. Compressive atelectasis in the left lower lobe. Patchy ground-glass opacities bilaterally, likely alveolitis.   Assessment / Plan /  Recommendation Clinical Impression  Pt demonstrates poor tolerance of PMSV with decreased breath support, minimal phonation, air trapping, desaturation and pt report of discomfort and shortness of breath. SLP was able to prolong placement to one minute by limiting activity, but as soon as pt attempted swallowing, speech or cough the valve had to be removed. Appears that due to increased pain and work of breathing pt no longer has appropriate breath support to redirect air around #8 Shiley with defplated cuff. Strongly recommend downsize of trach to facilitate tolerance and long term benefits of PMSV use (restored PEEP, communication, swallow function, expectoration). At time time, pt may only wear PMSV with SLP, wife and RN aware. Will discuss with MD.      SLP Assessment  Patient needs continued Speech Lanaguage Pathology Services    Follow Up Recommendations  Inpatient Rehab    Frequency and Duration min 3x week  2 weeks   Pertinent Vitals/Pain NA    SLP Goals Potential to Achieve Goals (ACUTE ONLY): Good Potential Considerations (ACUTE ONLY): Ability to learn/carryover information;Co-morbidities   PMSV Trial  PMSV was placed for: one minute Able to redirect subglottic air through upper airway: Yes Able to Attain Phonation: Yes Voice Quality: Breathy;Low vocal intensity;Hoarse Able to Expectorate Secretions: No Level of Secretion Expectoration with PMSV:  (unable to expectorate) Breath Support for Phonation: Severely decreased Intelligibility: Intelligibility reduced Word: 25-49% accurate Phrase: 0-24% accurate Sentence: 0-24% accurate Respirations During Trial: 25 SpO2 During Trial: (!) 87 % Behavior: Anxious   Tracheostomy Tube  Additional Tracheostomy Tube Assessment Fenestrated: No Trach Collar Period: 24/7 Secretion Description: thick, clear Frequency of Tracheal Suctioning: every 1-2 hours Level  of Secretion Expectoration: Tracheal    Vent Dependency  Vent  Dependent: No FiO2 (%): 28 %    Cuff Deflation Trial Tolerated Cuff Deflation: Yes Length of Time for Cuff Deflation Trial:  (baseline) Behavior: Alert;Anxious   Yaileen Hofferber, Katherene Ponto 02/13/2014, 9:50 AM

## 2014-02-13 NOTE — Evaluation (Signed)
Clinical/Bedside Swallow Evaluation Patient Details  Name: Allen Wood MRN: [id number] Date of Birth: January 13, 1971  Today's Date: 02/13/2014 Time: 0900-0930 SLP Time Calculation (min) (ACUTE ONLY): 30 min  Past Medical History:  Past Medical History  Diagnosis Date  . Alcohol abuse   . Depression   . Anxiety   . Pneumonia    Past Surgical History:  Past Surgical History  Procedure Laterality Date  . Mouth surgery      teeth removed.  . Tracheostomy  01/30/14    feinstein  . Esophagogastroduodenoscopy N/A 02/03/2014    Procedure: ESOPHAGOGASTRODUODENOSCOPY (EGD);  Surgeon: Ladene Artist, MD;  Location: [facility] ENDOSCOPY;  Service: Endoscopy;  Laterality: N/A;  bedside/trach/vent  . Peg placement N/A 02/03/2014    Procedure: PERCUTANEOUS ENDOSCOPIC GASTROSTOMY (PEG) PLACEMENT;  Surgeon: Ladene Artist, MD;  Location: [facility] ENDOSCOPY;  Service: Endoscopy;  Laterality: N/A;   HPI:  44 yo male with hx ETOH initially admitted 12/21 with PNA and pneumococcal bacteremia. He had progressive resp failure requiring intubation 12/22. Course was c/b septic shock, seizures, ETOH w/d, anemia, pleural effusion and acute renal failure requiring CVVH. Had difficulty with vent weaning and underwent trach placement 1/5. He was ultimately weaned to [facility] and tx to inpt rehab 1/14. Pt was able to toelrate PMSV with Shiley #8 cuffed for 40-60 minute intervals. He was consuming dys 2 diet with nectar thick liquids Despite multiple thoracentesis he continues to have recurrent large L pleural effusion, now with small right effusion and probable HCAP with fever and leukocytosis and is tx back to ICU 1/18.Current CT shows Large left pleural effusion and moderate right pleural effusion. Dense consolidation with air bronchograms in the right lower lobe concerning for pneumonia. Compressive atelectasis in the left lower lobe. Patchy ground-glass opacities bilaterally, likely alveolitis.   Assessment / Plan /  Recommendation Clinical Impression  Limited assessment due to poor tolerance of PMSV and poor appetite. Pt only accepted 2 pudding boluses and one sip of nectar thick liquids. Trials with PMSV in place immediately resulted in increased work of breathing and air trapping, requiring removal of PMSV. Given decreased respiratory function since last assessment and inability to wear valve with PO for improved airway protection and swallow function, recommend pt be NPO and rely on tube feeding until he is able to demonstrate progress with respiration and swallowing. RN and wife aware.    Aspiration Risk    Moderate   Diet Recommendation   NPO       Other  Recommendations   Downsize trach to 6 cuffless.   Follow Up Recommendations  Inpatient Rehab    Frequency and Duration min 3x week      Pertinent Vitals/Pain NA    SLP Swallow Goals     Swallow Study Prior Functional Status       General HPI: 44 yo male with hx ETOH initially admitted 12/21 with PNA and pneumococcal bacteremia. He had progressive resp failure requiring intubation 12/22. Course was c/b septic shock, seizures, ETOH w/d, anemia, pleural effusion and acute renal failure requiring CVVH. Had difficulty with vent weaning and underwent trach placement 1/5. He was ultimately weaned to [facility] and tx to inpt rehab 1/14. Pt was able to toelrate PMSV with Shiley #8 cuffed for 40-60 minute intervals. He was consuming dys 2 diet with nectar thick liquids Despite multiple thoracentesis he continues to have recurrent large L pleural effusion, now with small right effusion and probable HCAP with fever and leukocytosis and is tx back to  ICU 1/18.Current CT shows Large left pleural effusion and moderate right pleural effusion. Dense consolidation with air bronchograms in the right lower lobe concerning for pneumonia. Compressive atelectasis in the left lower lobe. Patchy ground-glass opacities bilaterally, likely alveolitis. Type of Study:  Bedside swallow evaluation Previous Swallow Assessment: BSE 02/08/14 Diet Prior to this Study: Dysphagia 2 (chopped);Nectar-thick liquids Temperature Spikes Noted: Yes Respiratory Status: Trach Trach Size and Type: #8;Cuff;Deflated;With PMSV in place History of Recent Intubation: Yes Length of Intubations (days): 13 days Date extubated: 01/29/14 Behavior/Cognition: Alert;Cooperative;Pleasant mood;Requires cueing;Doesn't follow directions Oral Cavity - Dentition: Adequate natural dentition Self-Feeding Abilities: Needs assist Patient Positioning: Upright in bed Baseline Vocal Quality: Aphonic Volitional Cough: Weak Volitional Swallow: Able to elicit    Oral/Motor/Sensory Function Overall Oral Motor/Sensory Function: Appears within functional limits for tasks assessed   Ice Chips     Thin Liquid Thin Liquid: Not tested    Nectar Thick Nectar Thick Liquid: Impaired Presentation: Cup;Self Fed Pharyngeal Phase Impairments: Change in Vital Signs   Honey Thick Honey Thick Liquid: Not tested   Puree Puree: Impaired Presentation: Spoon;Self Fed Pharyngeal Phase Impairments: Change in Vital Signs   Solid   GO    Solid: Not tested      Herbie Baltimore, MA CCC-SLP [phone]  Kayliegh Boyers, Katherene Ponto 02/13/2014,9:55 AM

## 2014-02-13 NOTE — Progress Notes (Signed)
RT assisted MD and resident in changing trach from a #8 cuffed shiley to a #6 cuffless shiley. Good color change present and bilateral breath sounds noted.

## 2014-02-13 NOTE — Progress Notes (Addendum)
PULMONARY / CRITICAL CARE MEDICINE   Name: Allen Wood MRN: [id number] DOB: 1970-10-20    ADMISSION DATE:  02/12/2014  REFERRING MD :  Letta Pate (Rehab)   CHIEF COMPLAINT:  PNA, pleural effusion Left > Right   INITIAL PRESENTATION: 44 yo male with hx ETOH initially admitted 12/21 with PNA and pneumococcal bacteremia.  He had progressive resp failure requiring intubation 12/22.  Course was c/b septic shock, seizures, ETOH w/d, anemia, pleural effusion and acute renal failure requiring CVVH.  Had difficulty with vent weaning and underwent trach placement 1/5.  He was ultimately weaned to [facility] and tx to inpt rehab 1/14.  Despite multiple thoracentesis he continues to have recurrent large L pleural effusion, now with small right effusion and probable HCAP with fever and leukocytosis and is tx back to ICU 1/18.    STUDIES:  CT chest 1/17>>>  Large left pleural effusion and moderate right pleural effusion. Dense consolidation with air bronchograms in the right lower lobe concerning for pneumonia. Compressive atelectasis in the left lower lobe.  Patchy ground-glass opacities bilaterally, likely alveolitis.  SIGNIFICANT EVENTS: 1/6 PEG placement (stark) 1/10 L thoracentesis >>1.2L 1/16 L thoracentesis >>> 1.6L 1/18 Transferred from rehab to ICU  1/18 Lt chest tube placement >>>  SUBJECTIVE:  Has some ongoing diarrhea No abdominal pain  No vomiting  Denies pain today  VITAL SIGNS: Temp:  [99.2 F (37.3 C)-101 F (38.3 C)] 99.2 F (37.3 C) (01/19 0400) Pulse Rate:  [82-110] 99 (01/19 0745) Resp:  [14-34] 24 (01/19 0745) BP: (91-144)/(50-90) 122/78 mmHg (01/19 0745) SpO2:  [85 %-99 %] 94 % (01/19 0745) FiO2 (%):  [28 %-80 %] 28 % (01/19 0745) Weight:  [159 lb 13.3 oz (72.5 kg)-160 lb 7.9 oz (72.8 kg)] 159 lb 13.3 oz (72.5 kg) (01/19 0214) HEMODYNAMICS:   VENTILATOR SETTINGS: Vent Mode:  [-]  FiO2 (%):  [28 %-80 %] 28 % INTAKE / OUTPUT:  Intake/Output Summary (Last 24 hours) at  02/13/14 0830 Last data filed at 02/13/14 5573  Gross per 24 hour  Intake 1527.5 ml  Output   3130 ml  Net -1602.5 ml    PHYSICAL EXAMINATION: General:  Chronically ill appearing young male, NAD in bed  Neuro: fully awake and obeys commands,  gen weakness HEENT:  Mm dry, no JVD, trach in place without apparent complications Cardiovascular:  s1s2 rrr Lungs:  resps even, mildly labored intermittently on ATC,  bilateral scattered rhonchi Chest tube in place. Dressing are clean and dry. Total of 2L of minimally bloody pleural fluid in container Abdomen:  Soft, PEG. Non-tender to palpation. Condom cath placed Musculoskeletal:  Warm and dry, pale, no sig edema   LABS:  CBC  Recent Labs Lab 02/12/14 2013 02/12/14 2200 02/13/14 0420  WBC 18.3* 18.2* 14.8*  HGB 8.3* 8.2* 7.9*  HCT 24.8* 24.4* 23.7*  PLT 416* 429* 375   Coag's No results for input(s): APTT, INR in the last 168 hours. BMET  Recent Labs Lab 02/12/14 0700 02/12/14 0959 02/12/14 1316 02/13/14 0420  NA 136 141  --  136  K 5.0 4.8  --  4.8  CL 102 103  --  105  CO2 30 31  --  24  BUN 19 19  --  15  CREATININE 0.78 0.83 0.86 0.82  GLUCOSE 96 110*  --  90   Electrolytes  Recent Labs Lab 02/12/14 0700 02/12/14 0959 02/13/14 0420  CALCIUM 8.5 8.5 8.4   Sepsis Markers  Recent Labs Lab 02/12/14 1316  LATICACIDVEN 1.0  PROCALCITON 0.17   ABG No results for input(s): PHART, PCO2ART, PO2ART in the last 168 hours. Liver Enzymes  Recent Labs Lab 02/09/14 0728 02/12/14 0959  AST 18 20  ALT 13 17  ALKPHOS 46 46  BILITOT 0.4 0.3  ALBUMIN 1.8* 1.6*   Cardiac Enzymes No results for input(s): TROPONINI, PROBNP in the last 168 hours. Glucose No results for input(s): GLUCAP in the last 168 hours.  Imaging Dg Chest Port 1 View  02/12/2014   CLINICAL DATA:  Left chest tube, pleural effusion  EXAM: PORTABLE CHEST - 1 VIEW  COMPARISON:  02/11/2014  FINDINGS: Tracheostomy tube in satisfactory position.  Dual-lumen left jugular central venous catheter with the tip projecting over the cavoatrial junction.  Small right pleural effusion. Moderate left pleural effusion with a left-sided chest tube. Bilateral lower lobe airspace disease. 3.8 cm masslike opacity in the right mid lung likely reflecting loculated fluid within the fissure. Left chest wall emphysema.  No pneumothorax.  Stable cardiomediastinal silhouette.  No acute osseous abnormality.  IMPRESSION: 1. Left-sided chest tube in satisfactory position. Moderate left pleural effusion. 2. Small right pleural effusion. 3. Bilateral lower lobe airspace disease which may reflect pneumonia versus atelectasis.   Electronically Signed   By: Kathreen Devoid   On: 02/12/2014 16:08   Dg Abd Portable 1v  02/12/2014   CLINICAL DATA:  Abdominal pain.  Initial evaluation  EXAM: PORTABLE ABDOMEN - 1 VIEW  COMPARISON:  None.  FINDINGS: Soft tissue structures are unremarkable. Gastrostomy tube noted projected in the stomach. No evidence of bowel or gastric distention. Calcific density noted over the left kidney consistent with left nephrolithiasis. No acute bony abnormality.  IMPRESSION: 1. Gastrostomy tube noted projected over stomach. No evidence of bowel or gastric distention. 2. Left nephrolithiasis.   Electronically Signed   By: Marcello Moores  Register   On: 02/12/2014 09:45     ASSESSMENT / PLAN:  PULMONARY Trach (CCM) 1/5>>> Acute on chronic respiratory failure s/p trach - initially r/t pneumococcal CAP, bacteremia, septic shock c/b ETOH w/d and seizures.  Now with recurrent pleural effusion and ?HCAP.  Pleural effusion, ?empyema  HCAP  1/18 Left chest tube pxcr 1/19 > chest tube slightly withdrawn. No Px. Interim increase in lt chest wall subq emphysema.  P:   Supplemental O2 via ATC  Chest tube may need adjustment today with repeat chest x ray  abx as below  BD's  Smoking cessation   CARDIOVASCULAR Sepsis Tachycardia  HTN  P:  Cont po metoprolol  Hold  clonidine for now with borderline BP  Lactate and pct are both nl  RENAL Hypernatremia - improved  P:   Cont free water  Intermittent renal function monitoring  GASTROINTESTINAL Dysphagia  Heme pos stools/ anemia - previously seen by GI.  EGD with only mild duodenitis.  Protein calorie malnutrition  1/6 PEG placement >> Diarrhea  P:   Cont feeds per PEG  PPI  Will need further outpt GI workup  Will check c. diff  HEMATOLOGIC Anemia. Hgb 6.8 >>7.9 after one unit of blood transfusion P:  F/u cbc  hgb goal > 7  INFECTIOUS HCAP with parapneumonia pleural effusion.\ Diarrhea. R/o c.diff colitis P:   BCx2 1/18>>> pending UC 1/18>>> Sputum 1/18>>> pending Pleural fluid 1/18>>>pending  Vancomycin 1/18>>> Zosyn 1/18>>>  ENDOCRINE No active issue  P:   cbg monitoring  NEUROLOGIC ETOH withdrawal  Seizures - r/t above P:   Cont fentanyl duragesic  Cont clonazepam  Consider resume  clonidine patch once BP improved  Cont thiamine, folate, mvi    FAMILY  - Updates:  Wife updated at bedside 1/18. No family at bedside this a.m.   Case discussed with Dr Halford Chessman.   Signed:  Jessee Avers, MD PGY-3 Internal Medicine Teaching Service Pager: [phone] 02/13/2014, 8:31 AM    Reviewed above, examined.  He continues to have significant drainage from chest tube. Breathing improved, but still has productive cough.  Has decreased BS on Lt > Rt.  Will continue Abx, f/u pleural fluid cx results, f/u CXR.  Will have trach changed to #6.  Keep in ICU for now.  Chesley Mires, MD [facility] Pulmonary/Critical Care 02/13/2014, 11:09 AM Pager:  [phone] After 3pm call: [phone]

## 2014-02-13 NOTE — Progress Notes (Signed)
INITIAL NUTRITION ASSESSMENT  DOCUMENTATION CODES Per approved criteria  -Not Applicable   INTERVENTION: Continue bolus feeds of Osmolite 1.5 via PEG to new goal rate of 350 ml, 4 times daily with 30 ml Prostat once daily to provide 2200 kcal, 103 grams of protein, and 1065 ml of free water.  Bolus tube feeds will provide 100% of estimated nutrition needs.  Increase free water flushes to 200 ml, 5 times daily. Total water daily: 2065 ml  Will continue to monitor.  NUTRITION DIAGNOSIS: Inadequate oral intake related to inability to eat as evidenced by NPO status.  Goal: Pt to meet >/= 90% of their estimated nutrition needs   Monitor:  TF tolerance, weight trends, labs, I/O's, NPO  Reason for Assessment: New TF  44 y.o. male  Admitting Dx: <principal problem not specified>  ASSESSMENT: 44 yo male with hx ETOH initially admitted 12/21 with PNA and pneumococcal bacteremia. He had progressive resp failure requiring intubation 12/22. Course was c/b septic shock, seizures, ETOH w/d, anemia, pleural effusion and acute renal failure requiring CVVH. Had difficulty with vent weaning and underwent trach placement 1/5. He was ultimately weaned to [facility] and tx to inpt rehab 1/14. Despite multiple thoracentesis he continues to have recurrent large L pleural effusion, now with small right effusion and probable HCAP with fever and leukocytosis and is tx back to ICU 1/18.   - S/P PEG placement on 1/6.  - Underwent thoracentesis 1/10 and 1/16 - Pt transferred from rehab to ICU 1/18 - Chest tube placed 1/18. - Pt's diet changed to NPO 1/19 per SLP recommendation.  - Pt is currently on trach collar. Per RN, pt is tolerating TF without residuals.  - Per RD note from 1/15, pt reports usual body weight as 141 lbs prior to hospital admission.  -Moderate muscle depletion of clavicle, acromion, patella and thigh.   Labs: Na and K WNL  Height: Ht Readings from Last 1 Encounters:  02/12/14 5'  11" (1.803 m)    Weight: Wt Readings from Last 1 Encounters:  02/13/14 159 lb 13.3 oz (72.5 kg)    Ideal Body Weight: 172 lbs  % Ideal Body Weight: 92%  Wt Readings from Last 10 Encounters:  02/13/14 159 lb 13.3 oz (72.5 kg)  02/12/14 156 lb 9.6 oz (71.033 kg)  02/03/14 165 lb 12.6 oz (75.2 kg)  09/14/13 144 lb 14.4 oz (65.726 kg)    Usual Body Weight: 141 lbs (per pt report)  % Usual Body Weight: 113%  BMI:  Body mass index is 22.3 kg/(m^2).  Estimated Nutritional Needs: Kcal: 2000-2200 Protein: 95-110 grams Fluid: 2 - 2.2 L/day  Skin: non-pitting UE edema   Diet Order: Diet NPO time specified  EDUCATION NEEDS: -No education needs identified at this time   Intake/Output Summary (Last 24 hours) at 02/13/14 1105 Last data filed at 02/13/14 0605  Gross per 24 hour  Intake 1527.5 ml  Output   3130 ml  Net -1602.5 ml    Last BM: 1/19  Labs:   Recent Labs Lab 02/12/14 0700 02/12/14 0959 02/12/14 1316 02/13/14 0420  NA 136 141  --  136  K 5.0 4.8  --  4.8  CL 102 103  --  105  CO2 30 31  --  24  BUN 19 19  --  15  CREATININE 0.78 0.83 0.86 0.82  CALCIUM 8.5 8.5  --  8.4  GLUCOSE 96 110*  --  90    CBG (last 3)  No  results for input(s): GLUCAP in the last 72 hours.  Scheduled Meds: . antiseptic oral rinse  7 mL Mouth Rinse QID  . chlorhexidine  15 mL Mouth Rinse BID  . clonazePAM  2 mg Per Tube BID  . feeding supplement (OSMOLITE 1.5 CAL)  350 mL Per Tube QID  . feeding supplement (PRO-STAT SUGAR FREE 64)  30 mL Per Tube Q1200  . fentaNYL  75 mcg Transdermal Q72H  . ferrous sulfate  300 mg Per Tube BID WC  . folic acid  1 mg Per Tube Daily  . free water  250 mL Per Tube 4 times per day  . heparin  5,000 Units Subcutaneous 3 times per day  . ipratropium-albuterol  3 mL Nebulization Q6H  . metoprolol tartrate  25 mg Per Tube BID  . multivitamin  5 mL Per Tube Daily  . pantoprazole sodium  40 mg Per Tube Daily  . piperacillin-tazobactam  (ZOSYN)  IV  3.375 g Intravenous Q8H  . sodium chloride  10-40 mL Intracatheter Q12H  . thiamine  100 mg Per Tube Daily  . vancomycin  750 mg Intravenous Q8H    Continuous Infusions: . sodium chloride 10 mL/hr at 02/12/14 1240    Past Medical History  Diagnosis Date  . Alcohol abuse   . Depression   . Anxiety   . Pneumonia     Past Surgical History  Procedure Laterality Date  . Mouth surgery      teeth removed.  . Tracheostomy  01/30/14    feinstein  . Esophagogastroduodenoscopy N/A 02/03/2014    Procedure: ESOPHAGOGASTRODUODENOSCOPY (EGD);  Surgeon: Ladene Artist, MD;  Location: [facility] ENDOSCOPY;  Service: Endoscopy;  Laterality: N/A;  bedside/trach/vent  . Peg placement N/A 02/03/2014    Procedure: PERCUTANEOUS ENDOSCOPIC GASTROSTOMY (PEG) PLACEMENT;  Surgeon: Ladene Artist, MD;  Location: [facility] ENDOSCOPY;  Service: Endoscopy;  Laterality: N/A;    Laurette Schimke MS, RD, LDN

## 2014-02-14 ENCOUNTER — Inpatient Hospital Stay (HOSPITAL_COMMUNITY): Payer: Medicaid Other

## 2014-02-14 DIAGNOSIS — J9620 Acute and chronic respiratory failure, unspecified whether with hypoxia or hypercapnia: Secondary | ICD-10-CM

## 2014-02-14 LAB — BASIC METABOLIC PANEL
ANION GAP: 8 (ref 5–15)
BUN: 15 mg/dL (ref 6–23)
CALCIUM: 8.2 mg/dL — AB (ref 8.4–10.5)
CHLORIDE: 102 meq/L (ref 96–112)
CO2: 26 mmol/L (ref 19–32)
CREATININE: 0.91 mg/dL (ref 0.50–1.35)
GFR calc Af Amer: >90 mL/min (ref >90)
GFR calc non Af Amer: >90 mL/min (ref >90)
GLUCOSE: 99 mg/dL (ref 70–99)
POTASSIUM: 4.9 mmol/L (ref 3.5–5.1)
Sodium: 136 mmol/L (ref 135–145)

## 2014-02-14 LAB — CBC
HCT: 22.4 % — ABNORMAL LOW (ref 39.0–52.0)
Hemoglobin: 7.5 g/dL — ABNORMAL LOW (ref 13.0–17.0)
MCH: 32.6 pg (ref 26.0–34.0)
MCHC: 33.5 g/dL (ref 30.0–36.0)
MCV: 97.4 fL (ref 78.0–100.0)
Platelets: 394 10*3/uL (ref 150–400)
RBC: 2.3 MIL/uL — AB (ref 4.22–5.81)
RDW: 15.1 % (ref 11.5–15.5)
WBC: 13.8 10*3/uL — ABNORMAL HIGH (ref 4.0–10.5)

## 2014-02-14 LAB — CLOSTRIDIUM DIFFICILE BY PCR: Toxigenic C. Difficile by PCR: NEGATIVE

## 2014-02-14 LAB — CULTURE, RESPIRATORY W GRAM STAIN: Special Requests: NORMAL

## 2014-02-14 LAB — CULTURE, RESPIRATORY

## 2014-02-14 MED ORDER — CLONAZEPAM 1 MG PO TABS
1.0000 mg | ORAL_TABLET | Freq: Two times a day (BID) | ORAL | Status: DC
  Administered 2014-02-14 – 2014-02-19 (×10): 1 mg
  Filled 2014-02-14 (×10): qty 1

## 2014-02-14 NOTE — Progress Notes (Signed)
Speech Language Pathology Treatment: Nada Boozer Speaking valve  Patient Details Name: Allen Wood MRN: [id number] DOB: 1970-10-16 Today's Date: 02/14/2014 Time: 3536-1443 SLP Time Calculation (min) (ACUTE ONLY): 38 min  Assessment / Plan / Recommendation Clinical Impression  Pt lethargic today, but able to awaken sufficiently for PMSV trial. Pt now with #6 cuffless trach. Tolerated valve for 5 minutes with stable heart rate, respiratory rate and O2 sats. He was able to talk with SLP/wife about his son with intelligible speech. PMSV precautions updated at [facility], including recommendation for valve placement for short periods of time with staff when alert. Wife received education re: change in trach, improvement in PMSV tolerance, cleaning of valve, and importance of removal when sleeping, during breathing treatments, and with increased secretions. Will continue to follow patient for use and care of PMSV and po trials as pt tolerates. Pt sleepy due to medication, so po trials were not attempted at this time.    HPI HPI: 44 yo male with hx ETOH initially admitted 12/21 with PNA and pneumococcal bacteremia. He had progressive resp failure requiring intubation 12/22. Course was c/b septic shock, seizures, ETOH w/d, anemia, pleural effusion and acute renal failure requiring CVVH. Had difficulty with vent weaning and underwent trach placement 1/5. He was ultimately weaned to [facility] and tx to inpt rehab 1/14. Pt was able to toelrate PMSV with Shiley #8 cuffed for 40-60 minute intervals. He was consuming dys 2 diet with nectar thick liquids Despite multiple thoracentesis he continues to have recurrent large L pleural effusion, now with small right effusion and probable HCAP with fever and leukocytosis and is tx back to ICU 1/18.Current CT shows Large left pleural effusion and moderate right pleural effusion. Dense consolidation with air bronchograms in the right lower lobe concerning for pneumonia. Compressive  atelectasis in the left lower lobe. Patchy ground-glass opacities bilaterally, likely alveolitis.    Pertinent Vitals Pain Assessment: No/denies pain  SLP Plan  Continue with current plan of care    Recommendations Diet recommendations: NPO      Patient may use Passy-Muir Speech Valve: Intermittently with supervision PMSV Supervision: Full       Oral Care Recommendations: Oral care prior to ice chips;Oral care BID Follow up Recommendations: Inpatient Rehab Plan: Continue with current plan of care    [place]. [name] [facility], CCC-SLP [phone]  Shonna Chock 02/14/2014, 11:19 AM

## 2014-02-14 NOTE — Clinical Documentation Improvement (Signed)
Please specify diagnosis related to below supporting information.   Possible Clinical Conditions?    Expected Acute Blood Loss Anemia  Acute Blood Loss Anemia  Acute on chronic blood loss anemia  Chronic blood loss anemia  Precipitous drop in Hematocrit  Other Condition________________  Cannot Clinically Determine    Supporting Information:  Per  02/13/14 progress note = Anemia. Hgb 6.8 >>7.9 after one unit of blood transfusion.     Patient's labs during this admission  Component     Latest Ref Rng 02/12/2014         7:00 AM  Hemoglobin     13.0 - 17.0 g/dL 6.8 (LL)  HCT     39.0 - 52.0 % 21.3 (L)    Component     Latest Ref Rng 02/12/2014 02/12/2014 02/13/2014 02/14/2014         8:13 PM 10:00 PM    Hemoglobin     13.0 - 17.0 g/dL 8.3 (L) 8.2 (L) 7.9 (L) 7.5 (L)  HCT     39.0 - 52.0 % 24.8 (L) 24.4 (L) 23.7 (L) 22.4 (L)    Thank You, Serena Colonel ,RN Clinical Documentation Specialist:  [place] Information Management

## 2014-02-14 NOTE — Progress Notes (Signed)
PULMONARY / CRITICAL CARE MEDICINE   Name: Allen Wood MRN: [id number] DOB: August 28, 1970    ADMISSION DATE:  02/12/2014  REFERRING MD :  Letta Pate (Rehab)   CHIEF COMPLAINT:  PNA, pleural effusion Left > Right   INITIAL PRESENTATION: 44 yo male with hx ETOH initially admitted 12/21 with PNA and pneumococcal bacteremia.  He had progressive resp failure requiring intubation 12/22.  Course was c/b septic shock, seizures, ETOH w/d, anemia, pleural effusion and acute renal failure requiring CVVH.  Had difficulty with vent weaning and underwent trach placement 1/5.  He was ultimately weaned to [facility] and tx to inpt rehab 1/14.  Despite multiple thoracentesis he continues to have recurrent large L pleural effusion, now with small right effusion and probable HCAP with fever and leukocytosis and is tx back to ICU 1/18.    STUDIES:  CT chest 1/17>>>  Large left pleural effusion and moderate right pleural effusion. Dense consolidation with air bronchograms in the right lower lobe concerning for pneumonia. Compressive atelectasis in the left lower lobe.  Patchy ground-glass opacities bilaterally, likely alveolitis.  SIGNIFICANT EVENTS: 1/6 PEG placement (stark) 1/10 L thoracentesis >>1.2L 1/16 L thoracentesis >>> 1.6L 1/18 Transferred from rehab to ICU  1/18 Lt chest tube placement >>> 1/19 Trach tube changed from 8 to 6 cuffless.  SUBJECTIVE:  Lethargic today.  No diarrhea  No SOB  VITAL SIGNS: Temp:  [98.4 F (36.9 C)-99 F (37.2 C)] 98.8 F (37.1 C) (01/20 0800) Pulse Rate:  [83-101] 88 (01/20 1000) Resp:  [13-36] 18 (01/20 1000) BP: (88-119)/(39-80) 114/74 mmHg (01/20 1000) SpO2:  [88 %-100 %] 92 % (01/20 1000) FiO2 (%):  [28 %] 28 % (01/20 1000) Weight:  [155 lb 6.8 oz (70.5 kg)-156 lb 12 oz (71.1 kg)] 155 lb 6.8 oz (70.5 kg) (01/20 0800) HEMODYNAMICS:   VENTILATOR SETTINGS: Vent Mode:  [-]  FiO2 (%):  [28 %] 28 % INTAKE / OUTPUT:  Intake/Output Summary (Last 24 hours) at  02/14/14 1020 Last data filed at 02/14/14 0800  Gross per 24 hour  Intake   2685 ml  Output   3172 ml  Net   -487 ml    PHYSICAL EXAMINATION: General:  Chronically ill appearing young male, lethargic and restless in bed. Wife at bedside  Neuro: fully awake and obeys commands,  gen weakness HEENT:  Mm dry, no JVD, trach in place without apparent complications Cardiovascular:  s1s2 rrr Lungs:  resps even without dyspnea, on ATC, bilateral scattered rhonchi Chest tube in place. Dressing are clean and dry. Total of 350cc of clear yellow fluid draining  Abdomen:  Soft, PEG. Non-tender to palpation. Musculoskeletal:  Warm and dry, pale, no sig edema   LABS:  CBC  Recent Labs Lab 02/12/14 2200 02/13/14 0420 02/14/14 0415  WBC 18.2* 14.8* 13.8*  HGB 8.2* 7.9* 7.5*  HCT 24.4* 23.7* 22.4*  PLT 429* 375 394   Coag's No results for input(s): APTT, INR in the last 168 hours. BMET  Recent Labs Lab 02/12/14 0959 02/12/14 1316 02/13/14 0420 02/14/14 0415  NA 141  --  136 136  K 4.8  --  4.8 4.9  CL 103  --  105 102  CO2 31  --  24 26  BUN 19  --  15 15  CREATININE 0.83 0.86 0.82 0.91  GLUCOSE 110*  --  90 99   Electrolytes  Recent Labs Lab 02/12/14 0959 02/13/14 0420 02/14/14 0415  CALCIUM 8.5 8.4 8.2*   Sepsis Markers  Recent  Labs Lab 02/12/14 1316  LATICACIDVEN 1.0  PROCALCITON 0.17   ABG No results for input(s): PHART, PCO2ART, PO2ART in the last 168 hours. Liver Enzymes  Recent Labs Lab 02/09/14 0728 02/12/14 0959  AST 18 20  ALT 13 17  ALKPHOS 46 46  BILITOT 0.4 0.3  ALBUMIN 1.8* 1.6*   Cardiac Enzymes No results for input(s): TROPONINI, PROBNP in the last 168 hours. Glucose No results for input(s): GLUCAP in the last 168 hours.  Imaging Dg Chest Port 1 View  02/13/2014   CLINICAL DATA:  Pleural effusion.  EXAM: PORTABLE CHEST - 1 VIEW  COMPARISON:  02/12/2014.  FINDINGS: Tracheostomy tube, left IJ line in stable position. Left chest tube  has been withdrawn slightly. Is tip remains position of the left chest. Stable cardiomegaly and pulmonary venous congestion. Bilateral pulmonary alveolar infiltrates are again noted. Mild pleural effusions left greater than right again noted. Findings are most likely secondary to congestive heart failure. Bilateral pneumonia cannot be excluded. No pneumothorax. Left chest wall subcutaneous emphysema again noted. This is increased slightly.  IMPRESSION: 1. Left chest tube has been withdrawn slightly. Its tip remains position over the left upper chest. No pneumothorax. Interim increase in left chest wall subcutaneous emphysema. 2. Remaining lines and tubes in stable position. 3. Persistent cardiomegaly with bilateral pulmonary alveolar infiltrates. Bilateral pleural effusions, left side greater than right. These findings are most consistent with congestive heart failure. Bilateral pneumonia cannot be excluded.   Electronically Signed   By: Marcello Moores  Register   On: 02/13/2014 07:24    pxcr 1/19 > chest tube slightly withdrawn. No Px. Interim increase in lt chest wall subq emphysema.   pxcr 1/20: chest tube in stable position from yesterday  ASSESSMENT / PLAN:  PULMONARY Trach (CCM) 1/5>>> Acute on chronic respiratory failure s/p trach - initially r/t pneumococcal CAP, bacteremia, septic shock c/b ETOH w/d and seizures.   Pleural effusion, transudate, likely due to poor nutrition Possible HCAP  1/18 Left chest tube P:   Trach changed 1/19 to a 6 cuffless Supplemental O2 via ATC  Chest tube stable from today's imaging, plan to leave on suction 1/20 given high output abx as below  BD's  Smoking cessation   CARDIOVASCULAR Sepsis Tachycardia >resolved HTN > improved P:  Cont po metoprolol   Lactate and pct are both nl  RENAL Hypernatremia - improved  P:   Cont free water  Intermittent renal function monitoring  GASTROINTESTINAL Dysphagia  Heme pos stools/ anemia - previously seen by GI.   EGD with only mild duodenitis.  Protein calorie malnutrition  1/6 PEG placement >> Diarrhea  P:   Cont feeds per PEG  PPI  Will need further outpt GI workup  C.diff negative  HEMATOLOGIC Anemia of chronic illiness. Hgb 6.8 >>7.9 >7.5.  Transfused one unit of blood 1/18 P:  F/u cbc daily hgb goal > 7  INFECTIOUS HCAP with parapneumonia pleural effusion.\ Diarrhea. R/o c.diff colitis P:   BCx2 1/18>>> pending UC 1/18>>> Sputum 1/18>>> pending Pleural fluid 1/18>>>neg to date  Vancomycin 1/18>>> Zosyn 1/18>>>  Unclear that he has a new HCAP given effusion analysis, absence of secretions. He did have fever prior to transfer to ICU. Will plan to d/c abx if pleural fluid cx come back  ENDOCRINE No active issue  P:   cbg monitoring  NEUROLOGIC ETOH withdrawal  Seizures - r/t above P:   Cont fentanyl duragesic  Cont clonazepam   Cont thiamine, folate, mvi    FAMILY  -  Updates:  Wife updated at bedside 1/18 and 1/20.    Case discussed with Dr Lamonte Sakai. Will probably be moved to a SDU if he remains stable today.   Signed:  Jessee Avers, MD PGY-3 Internal Medicine Teaching Service Pager: [phone] 02/14/2014, 10:20 AM    Attending Note:  I have examined patient, reviewed labs, studies and notes. I have discussed the case with Dr Alice Rieger, and I agree with the data and plans as amended above. He is s/p L CT placement for large effusion. The fluid appears to be a transudate, cx's pending. Initially suspected L HCAP but unclear based on fluid analysis and clinical hx.. His tube drainage has decreased some, still w 350cc last 24h. Will leave his tube to suction for another day and then consider waterseal if output decreases on 1/21.   Baltazar Apo, MD, PhD 02/14/2014, 10:38 AM [place] [facility] [phone] or if no answer [phone]

## 2014-02-15 ENCOUNTER — Inpatient Hospital Stay (HOSPITAL_COMMUNITY): Payer: Medicaid Other

## 2014-02-15 LAB — COMPREHENSIVE METABOLIC PANEL
ALT: 13 U/L (ref 0–53)
AST: 17 U/L (ref 0–37)
Albumin: 1.5 g/dL — ABNORMAL LOW (ref 3.5–5.2)
Alkaline Phosphatase: 42 U/L (ref 39–117)
Anion gap: 5 (ref 5–15)
BILIRUBIN TOTAL: 0.7 mg/dL (ref 0.3–1.2)
BUN: 14 mg/dL (ref 6–23)
CO2: 29 mmol/L (ref 19–32)
Calcium: 8.2 mg/dL — ABNORMAL LOW (ref 8.4–10.5)
Chloride: 99 mEq/L (ref 96–112)
Creatinine, Ser: 0.92 mg/dL (ref 0.50–1.35)
GFR calc Af Amer: >90 mL/min (ref >90)
GFR calc non Af Amer: >90 mL/min (ref >90)
Glucose, Bld: 142 mg/dL — ABNORMAL HIGH (ref 70–99)
POTASSIUM: 4.5 mmol/L (ref 3.5–5.1)
Sodium: 133 mmol/L — ABNORMAL LOW (ref 135–145)
Total Protein: 5 g/dL — ABNORMAL LOW (ref 6.0–8.3)

## 2014-02-15 LAB — CBC
HEMATOCRIT: 22.8 % — AB (ref 39.0–52.0)
Hemoglobin: 7.6 g/dL — ABNORMAL LOW (ref 13.0–17.0)
MCH: 31.8 pg (ref 26.0–34.0)
MCHC: 33.3 g/dL (ref 30.0–36.0)
MCV: 95.4 fL (ref 78.0–100.0)
PLATELETS: 444 10*3/uL — AB (ref 150–400)
RBC: 2.39 MIL/uL — AB (ref 4.22–5.81)
RDW: 14.5 % (ref 11.5–15.5)
WBC: 13.6 10*3/uL — ABNORMAL HIGH (ref 4.0–10.5)

## 2014-02-15 LAB — VANCOMYCIN, TROUGH: VANCOMYCIN TR: 29.6 ug/mL — AB (ref 10.0–20.0)

## 2014-02-15 MED ORDER — VANCOMYCIN HCL IN DEXTROSE 750-5 MG/150ML-% IV SOLN
750.0000 mg | Freq: Two times a day (BID) | INTRAVENOUS | Status: AC
  Administered 2014-02-16 – 2014-02-17 (×4): 750 mg via INTRAVENOUS
  Filled 2014-02-15 (×4): qty 150

## 2014-02-15 NOTE — Progress Notes (Signed)
Speech Language Pathology Treatment: Dysphagia;Passy Muir Speaking valve  Patient Details Name: Allen Wood MRN: [id number] DOB: Apr 18, 1970 Today's Date: 02/15/2014 Time: 1450-1520 SLP Time Calculation (min) (ACUTE ONLY): 30 min  Assessment / Plan / Recommendation Clinical Impression  Pt much improved today. He is alert, conversant, pleasant and cooperative. RN reports pt has had PMSV in place since this morning, and has not required suctioning. Voice quality is clear and strong. PO trials of puree and thin liquid were provided. No overt s/s aspiration noted with either consistency tested. Recommend beginning full liquid diet with thin liquids, adhering to posted precautions. ST to follow for diet tolerance and determine readiness to advance/complete objective study. Safe swallow and updated Passy-Muir Valve precautions at [facility]. Call placed to MD requesting diet order. RN aware of results and recommendations.   HPI HPI: 44 yo male with hx ETOH initially admitted 12/21 with PNA and pneumococcal bacteremia. He had progressive resp failure requiring intubation 12/22. Course was c/b septic shock, seizures, ETOH w/d, anemia, pleural effusion and acute renal failure requiring CVVH. Had difficulty with vent weaning and underwent trach placement 1/5. He was ultimately weaned to [facility] and tx to inpt rehab 1/14. Pt was able to toelrate PMSV with Shiley #8 cuffed for 40-60 minute intervals. He was consuming dys 2 diet with nectar thick liquids Despite multiple thoracentesis he continues to have recurrent large L pleural effusion, now with small right effusion and probable HCAP with fever and leukocytosis and is tx back to ICU 1/18.Current CT shows Large left pleural effusion and moderate right pleural effusion. Dense consolidation with air bronchograms in the right lower lobe concerning for pneumonia. Compressive atelectasis in the left lower lobe. Patchy ground-glass opacities bilaterally, likely alveolitis.     Pertinent Vitals Pain Assessment: No/denies pain  SLP Plan  Continue with current plan of care    Recommendations Diet recommendations: Thin liquid (full liquid) Liquids provided via: Straw;Cup Medication Administration: Via alternative means Supervision: Patient able to self feed;Full supervision/cueing for compensatory strategies Postural Changes and/or Swallow Maneuvers: Out of bed for meals;Seated upright 90 degrees      Patient may use Passy-Muir Speech Valve: During all waking hours (remove during sleep);During PO intake/meals PMSV Supervision: Intermittent       Oral Care Recommendations: Oral care Q4 per protocol Follow up Recommendations: Inpatient Rehab Plan: Continue with current plan of care    [place] B. [name], CCC-SLP [phone]  Shonna Chock 02/15/2014, 3:33 PM

## 2014-02-15 NOTE — Trach Care Team (Signed)
[place] Progression Note   Patient Details Name: Sue Mcalexander MRN: [id number] DOB: 04-28-1970 Today's Date: 02/15/2014   Tracheostomy Assessment    Tracheostomy Shiley 6 mm Uncuffed (Active)  Status Secured 02/15/2014  1:11 PM  Site Assessment Clean;Dry 02/15/2014 12:00 PM  Site Care Dressing applied 02/15/2014 12:00 PM  [place] Cleansed/dried 02/15/2014  5:00 AM  Ties Assessment Secure 02/15/2014  1:11 PM  Cuff pressure (cm) 0 cm 02/15/2014 12:00 PM  Trach Changed Yes 02/13/2014 11:10 AM  Emergency Equipment at bedside Yes 02/15/2014  1:11 PM     Care Needs     Respiratory Therapy O2 Device: Tracheostomy Collar FiO2 (%): 40 % SpO2: 97 % Education:  (not needed at this time) Follow up recommendations:  (will continue to follow for pt progress)    Speech Language Pathology  SLP chart review complete: Patient currently receiving at SLP services Patient may use Passy-Muir Speech Valve: Intermittently with supervision PMSV Supervision: Full MD: Please consider changing trach tube to : Other (comment) (now adequate ) Recommendations for Other Services: Rehab consult Follow up Recommendations: Inpatient Rehab SLP barriers to progression:  (lack of appetite, pain, fatigue)   Physical Therapy      Occupational Therapy      Nutritional Patient's Current Diet: NPO, Tube feeding Tube Feeding: Osmolite 1.5 Cal Tube Feeding Frequency: Intermittent Tube Feeding Strength: Full strength    Case Management/Social Work Plan to address barriers:  (possible CT to H2O seal pending drainage) Anticipated discharge disposition:  (CIR)    Provider [place] Team/Provider Recommendations [place] Team Members Present-  Ciro Backer, RT, Alvino Blood, SW, Molli Barrows, RD, Herbie [place], SLP & Nolon Rod, RN   None at present. Consider PT/OT evaluation before return to CIR.           Darius Fillingim, Jaci Carrel (scribe for team) 02/15/2014, 1:55 PM

## 2014-02-15 NOTE — Progress Notes (Signed)
CRITICAL VALUE ALERT  Critical value received:  Vancomycin trough  Date of notification:  02/15/2014  Time of notification:  2121  Critical value read back:Yes.    Nurse who received alert:  Coralie Carpen  MD notified (1st page): Critical Care On Call  Time of first page:  2137  MD notified (2nd page):  Time of second page:  Responding MD:  Nelda Marseille, MD  Time MD responded:  4128  Pharmacy to redose medication

## 2014-02-15 NOTE — Progress Notes (Signed)
PULMONARY / CRITICAL CARE MEDICINE   Name: Allen Wood MRN: [id number] DOB: April 24, 1970    ADMISSION DATE:  02/12/2014  REFERRING MD :  Letta Pate (Rehab)   CHIEF COMPLAINT:  PNA, pleural effusion Left > Right   INITIAL PRESENTATION: 44 yo male with hx ETOH initially admitted 12/21 with PNA and pneumococcal bacteremia.  He had progressive resp failure requiring intubation 12/22.  Course was c/b septic shock, seizures, ETOH w/d, anemia, pleural effusion and acute renal failure requiring CVVH.  Had difficulty with vent weaning and underwent trach placement 1/5.  He was ultimately weaned to [facility] and tx to inpt rehab 1/14.  Despite multiple thoracentesis he continues to have recurrent large L pleural effusion, now with small right effusion and probable HCAP with fever and leukocytosis and is tx back to ICU 1/18.    STUDIES:  CT chest 1/17>>>  Large left pleural effusion and moderate right pleural effusion. Dense consolidation with air bronchograms in the right lower lobe concerning for pneumonia. Compressive atelectasis in the left lower lobe.  Patchy ground-glass opacities bilaterally, likely alveolitis.  SIGNIFICANT EVENTS: 1/6 PEG placement (stark) 1/10 L thoracentesis >>1.2L 1/16 L thoracentesis >>> 1.6L 1/18 Transferred from rehab to ICU  1/18 Lt chest tube placement >>> 1/19 Trach tube changed from 8 to 6 cuffless.  SUBJECTIVE:  Much more awake, alert today. "feels normal"  VITAL SIGNS: Temp:  [98.4 F (36.9 C)-99.6 F (37.6 C)] 99.6 F (37.6 C) (01/21 0724) Pulse Rate:  [35-96] 94 (01/21 0724) Resp:  [9-21] 17 (01/21 0724) BP: (95-120)/(46-72) 105/63 mmHg (01/21 0724) SpO2:  [90 %-96 %] 96 % (01/21 0724) FiO2 (%):  [28 %-35 %] 35 % (01/21 0757) Weight:  [71.2 kg (156 lb 15.5 oz)] 71.2 kg (156 lb 15.5 oz) (01/21 0500) HEMODYNAMICS:   VENTILATOR SETTINGS: Vent Mode:  [-]  FiO2 (%):  [28 %-35 %] 35 % INTAKE / OUTPUT:  Intake/Output Summary (Last 24 hours) at 02/15/14  1001 Last data filed at 02/15/14 0747  Gross per 24 hour  Intake   1630 ml  Output   1660 ml  Net    -30 ml    PHYSICAL EXAMINATION:  General:  Chronically ill appearing young male. Wife at bedside  Neuro: Alert, oriented HEENT:  Mm dry, no JVD, trach in place without apparent complications Cardiovascular:  s1s2 rrr Lungs:  resps even without dyspnea, on ATC, bilateral scattered rhonchi Chest tube in place. Dressing are clean and dry. Total of 60cc drainage last 24 hours.  Abdomen:  Soft, PEG. Non-tender to palpation. Musculoskeletal:  Warm and dry, pale, no sig edema   LABS:  CBC  Recent Labs Lab 02/13/14 0420 02/14/14 0415 02/15/14 0530  WBC 14.8* 13.8* 13.6*  HGB 7.9* 7.5* 7.6*  HCT 23.7* 22.4* 22.8*  PLT 375 394 444*   Coag's No results for input(s): APTT, INR in the last 168 hours. BMET  Recent Labs Lab 02/13/14 0420 02/14/14 0415 02/15/14 0530  NA 136 136 133*  K 4.8 4.9 4.5  CL 105 102 99  CO2 24 26 29   BUN 15 15 14   CREATININE 0.82 0.91 0.92  GLUCOSE 90 99 142*   Electrolytes  Recent Labs Lab 02/13/14 0420 02/14/14 0415 02/15/14 0530  CALCIUM 8.4 8.2* 8.2*   Sepsis Markers  Recent Labs Lab 02/12/14 1316  LATICACIDVEN 1.0  PROCALCITON 0.17   ABG No results for input(s): PHART, PCO2ART, PO2ART in the last 168 hours. Liver Enzymes  Recent Labs Lab 02/09/14 0728 02/12/14 6834  02/15/14 0530  AST 18 20 17   ALT 13 17 13   ALKPHOS 46 46 42  BILITOT 0.4 0.3 0.7  ALBUMIN 1.8* 1.6* 1.5*   Cardiac Enzymes No results for input(s): TROPONINI, PROBNP in the last 168 hours. Glucose No results for input(s): GLUCAP in the last 168 hours.  Imaging Dg Chest Port 1 View  02/14/2014   CLINICAL DATA:  Chest tube.  EXAM: PORTABLE CHEST - 1 VIEW  COMPARISON:  02/13/2014.  FINDINGS: Tracheostomy tube, left IJ line, left chest tube in stable position. Mediastinum and hilar structures are normal. Stable mild cardiomegaly. Persistent bilateral  pulmonary alveolar infiltrates and pleural effusions noted persistent changes of congestive heart failure could present this fashion. Bilateral pneumonia cannot be excluded. No pneumothorax left chest wall subcutaneous emphysema again noted.  IMPRESSION: 1. Lines and tubes in stable position. Left chest tube in stable position. Stable left chest wall subcutaneous emphysema. No pneumothorax. 2. Persistent cardiomegaly with bilateral pulmonary alveolar infiltrates and bilateral effusions consistent with persistent congestive heart failure. Bilateral pneumonia cannot be excluded.   Electronically Signed   By: Marcello Moores  Register   On: 02/14/2014 07:28    pxcr 1/19: chest tube slightly withdrawn. No Px. Interim increase in lt chest wall subq emphysema.   pxcr 1/20: chest tube in stable position from yesterday  PCXR 1/21: Stable CT position, persistent bilateral effusions.  ASSESSMENT / PLAN:  PULMONARY Trach (CCM) 1/5>> to 6 cuffless 1/19 >>> Acute on chronic respiratory failure s/p trach - initially r/t pneumococcal CAP, bacteremia, septic shock c/b ETOH w/d and seizures.   Pleural effusion, transudate, likely due to poor nutrition Possible HCAP  1/18 Left chest tube P:   Supplemental O2 via ATC  Chest tube stable from today's imaging. Continue suction for at least another 24hours. Consider waterseal 1/22 Continue PMV trials, currently tolerating well. Abx as below  BD's  Smoking cessation   CARDIOVASCULAR HTN > improved P:  Cont po metoprolol    RENAL Hypernatremia - resolved P:   stop free water Intermittent renal function monitoring  GASTROINTESTINAL Dysphagia  Heme pos stools/ anemia - previously seen by GI.  EGD with only mild duodenitis.  Protein calorie malnutrition  1/6 PEG placement >> P:   Cont feeds per PEG  PPI  Will need further outpt GI workup   HEMATOLOGIC Anemia of chronic illness. Hgb 6.8 >>7.9 >7.5.  Transfused one unit of blood 1/18 P:  F/u cbc  daily hgb goal > 7  INFECTIOUS HCAP with parapneumonia pleural effusion.\ Diarrhea. R/o c.diff colitis P:   BCx2 1/18>>> pending UC 1/18>>> Sputum 1/18>>> pending Pleural fluid 1/18>>>neg to date  Vancomycin 1/18>>> Zosyn 1/18>>>  Unclear that he has a new HCAP given effusion analysis, absence of secretions. He did have fever prior to transfer to ICU. Will plan to d/c abx if pleural fluid cx come back  ENDOCRINE No active issue  P:   cbg monitoring  NEUROLOGIC ETOH withdrawal  Seizures - r/t above P:   Cont fentanyl duragesic  Cont clonazepam   Cont thiamine, folate, mvi    FAMILY  - Updates:  Updated wife and patient in room this AM.    Georgann Housekeeper, AGACNP-BC [place] Pulmonology/Critical Care Pager [phone] or [phone]  Reviewed above, and examined.  He is feeling better.  More alert.  Breathing easier.  Mild pain at chest tube site.  Decreased bs at Lt base.  Trach site clean.  Drainage from chest tube 572 ml on 02/14/14.  Will plan  to get repeat CT chest on 02/16/14, and then make decision about plans for chest tube and ABx.   Chesley Mires, MD [facility] Pulmonary/Critical Care 02/15/2014, 11:09 AM Pager:  [phone] After 3pm call: [phone]

## 2014-02-15 NOTE — Progress Notes (Signed)
ANTIBIOTIC CONSULT NOTE - INITIAL  Pharmacy Consult for vancomycin Indication: HCAP  No Known Allergies  Patient Measurements: Height: 5\' 11"  (180.3 cm) Weight: 156 lb 15.5 oz (71.2 kg) IBW/kg (Calculated) : 75.3  Vital Signs: Temp: 98.3 F (36.8 C) (01/21 1859) Temp Source: Oral (01/21 1859) BP: 123/63 mmHg (01/21 2116) Pulse Rate: 90 (01/21 2116) Intake/Output from previous day: 01/20 0701 - 01/21 0700 In: 1650 [I.V.:30; NG/GT:1320; IV Piggyback:300] Out: 2110 [Urine:2050; Chest Tube:60] Intake/Output from this shift: Total I/O In: 20 [I.V.:20] Out: 250 [Urine:250]  Labs:  Recent Labs  02/13/14 0420 02/14/14 0415 02/15/14 0530  WBC 14.8* 13.8* 13.6*  HGB 7.9* 7.5* 7.6*  PLT 375 394 444*  CREATININE 0.82 0.91 0.92   Estimated Creatinine Clearance: 102.1 mL/min (by C-G formula based on Cr of 0.92).  Recent Labs  02/15/14 2022  [place] 29.6*     Microbiology: Recent Results (from the past 720 hour(s))  Culture, Urine     Status: None   Collection Time: 01/16/14 10:48 PM  Result Value Ref Range Status   Specimen Description URINE, CATHETERIZED  Final   Special Requests NONE  Final   Culture  Setup Time   Final    01/17/2014 11:37 Performed at [place] Performed at [facility]   Final   Culture NO GROWTH Performed at [facility]   Final   Report Status 01/18/2014 FINAL  Final  Body fluid culture     Status: None   Collection Time: 01/23/14 12:14 PM  Result Value Ref Range Status   Specimen Description PLEURAL RIGHT  Final   Special Requests NONE  Final   Gram Stain   Final    CYTOSPIN SLIDE WBC PRESENT,BOTH PMN AND MONONUCLEAR NO ORGANISMS SEEN Performed at [facility]    Culture   Final    NO GROWTH 3 DAYS Performed at [facility]    Report Status 01/28/2014 FINAL  Final  AFB culture with smear     Status: None (Preliminary result)   Collection Time: 01/23/14  12:14 PM  Result Value Ref Range Status   Specimen Description PLEURAL RIGHT  Final   Special Requests NONE  Final   Acid Fast Smear   Final    NO ACID FAST BACILLI SEEN Performed at [facility]    Culture   Final    CULTURE WILL BE EXAMINED FOR 6 WEEKS BEFORE ISSUING A FINAL REPORT Performed at [facility]    Report Status PENDING  Incomplete  Fungus Culture with Smear     Status: None (Preliminary result)   Collection Time: 01/23/14 12:14 PM  Result Value Ref Range Status   Specimen Description PLEURAL RIGHT  Final   Special Requests NONE  Final   Fungal Smear   Final    NO YEAST OR FUNGAL ELEMENTS SEEN Performed at [facility]    Culture   Final    CULTURE IN PROGRESS FOR FOUR WEEKS Performed at [facility]    Report Status PENDING  Incomplete  Culture, blood (routine x 2)     Status: None   Collection Time: 01/24/14 10:00 AM  Result Value Ref Range Status   Specimen Description BLOOD LEFT WRIST  Final   Special Requests BOTTLES DRAWN AEROBIC ONLY [place]  Final   Culture   Final    NO GROWTH 5 DAYS Performed at [facility]    Report Status  01/30/2014 FINAL  Final  Culture, blood (routine x 2)     Status: None   Collection Time: 01/24/14 10:10 AM  Result Value Ref Range Status   Specimen Description BLOOD RIGHT ARM  Final   Special Requests BOTTLES DRAWN AEROBIC AND ANAEROBIC 10CC  Final   Culture   Final    NO GROWTH 5 DAYS Note: Culture results may be compromised due to an inadequate volume of blood received in culture bottles. Performed at [facility]    Report Status 01/30/2014 FINAL  Final  Urine culture     Status: None   Collection Time: 01/24/14  5:04 PM  Result Value Ref Range Status   Specimen Description URINE, CATHETERIZED  Final   Special Requests NONE  Final   Colony Count NO GROWTH Performed at [facility]   Final   Culture NO GROWTH Performed at [facility]   Final    Report Status 01/28/2014 FINAL  Final  Culture, respiratory (NON-Expectorated)     Status: None   Collection Time: 01/25/14 12:11 AM  Result Value Ref Range Status   Specimen Description TRACHEAL ASPIRATE  Final   Special Requests NONE  Final   Gram Stain   Final    ABUNDANT WBC PRESENT,BOTH PMN AND MONONUCLEAR RARE SQUAMOUS EPITHELIAL CELLS PRESENT NO ORGANISMS SEEN Performed at [facility]    Culture   Final    Non-Pathogenic Oropharyngeal-type Flora Isolated. Performed at [facility]    Report Status 01/28/2014 FINAL  Final  Culture, blood (routine x 2)     Status: None   Collection Time: 01/31/14  9:26 AM  Result Value Ref Range Status   Specimen Description BLOOD RIGHT ARM  Final   Special Requests BOTTLES DRAWN AEROBIC ONLY 4CC  Final   Culture   Final    NO GROWTH 5 DAYS Performed at [facility]    Report Status 02/06/2014 FINAL  Final  Culture, blood (routine x 2)     Status: None   Collection Time: 01/31/14  9:44 AM  Result Value Ref Range Status   Specimen Description BLOOD RIGHT HAND  Final   Special Requests BOTTLES DRAWN AEROBIC ONLY 5CC  Final   Culture   Final    NO GROWTH 5 DAYS Performed at [facility]    Report Status 02/06/2014 FINAL  Final  Clostridium Difficile by PCR     Status: None   Collection Time: 01/31/14 11:27 AM  Result Value Ref Range Status   C difficile by pcr NEGATIVE NEGATIVE Final  Urine culture     Status: None   Collection Time: 02/01/14  9:32 AM  Result Value Ref Range Status   Specimen Description URINE, CATHETERIZED  Final   Special Requests NONE  Final   Colony Count NO GROWTH Performed at [facility]   Final   Culture NO GROWTH Performed at [facility]   Final   Report Status 02/02/2014 FINAL  Final  Body fluid culture     Status: None   Collection Time: 02/04/14 12:22 PM  Result Value Ref Range Status   Specimen Description PLEURAL FLUID LEFT  Final   Special  Requests NONE  Final   Gram Stain   Final    FEW WBC PRESENT,BOTH PMN AND MONONUCLEAR NO ORGANISMS SEEN Performed at [facility]    Culture   Final    NO GROWTH 3 DAYS Performed at [facility]  Report Status 02/08/2014 FINAL  Final  Culture, respiratory (NON-Expectorated)     Status: None   Collection Time: 02/09/14 10:17 AM  Result Value Ref Range Status   Specimen Description TRACHEAL ASPIRATE  Final   Special Requests NONE  Final   Gram Stain   Final    NO WBC SEEN NO SQUAMOUS EPITHELIAL CELLS SEEN NO ORGANISMS SEEN Performed at [facility]    Culture   Final    RARE CANDIDA ALBICANS Performed at [facility]    Report Status 02/11/2014 FINAL  Final  Culture, Urine     Status: None   Collection Time: 02/09/14 10:52 AM  Result Value Ref Range Status   Specimen Description URINE, RANDOM  Final   Special Requests NONE  Final   Colony Count NO GROWTH Performed at [facility]   Final   Culture NO GROWTH Performed at [facility]   Final   Report Status 02/11/2014 FINAL  Final  MRSA PCR Screening     Status: None   Collection Time: 02/12/14 12:26 PM  Result Value Ref Range Status   MRSA by PCR NEGATIVE NEGATIVE Final    Comment:        The GeneXpert MRSA Assay (FDA approved for NASAL specimens only), is one component of a comprehensive MRSA colonization surveillance program. It is not intended to diagnose MRSA infection nor to guide or monitor treatment for MRSA infections.   Culture, blood (routine x 2)     Status: None (Preliminary result)   Collection Time: 02/12/14  1:16 PM  Result Value Ref Range Status   Specimen Description BLOOD RIGHT ANTECUBITAL  Final   Special Requests BOTTLES DRAWN AEROBIC AND ANAEROBIC 10CC  Final   Culture   Final           BLOOD CULTURE RECEIVED NO GROWTH TO DATE CULTURE WILL BE HELD FOR 5 DAYS BEFORE ISSUING A FINAL NEGATIVE REPORT Performed at [facility]     Report Status PENDING  Incomplete  Culture, respiratory (NON-Expectorated)     Status: None   Collection Time: 02/12/14  1:21 PM  Result Value Ref Range Status   Specimen Description TRACHEAL ASPIRATE  Final   Special Requests Normal  Final   Gram Stain   Final    FEW WBC PRESENT,BOTH PMN AND MONONUCLEAR RARE SQUAMOUS EPITHELIAL CELLS PRESENT RARE GRAM POSITIVE COCCI IN PAIRS Performed at [facility]    Culture   Final    RARE CANDIDA ALBICANS Performed at [facility]    Report Status 02/14/2014 FINAL  Final  Culture, blood (routine x 2)     Status: None (Preliminary result)   Collection Time: 02/12/14  1:28 PM  Result Value Ref Range Status   Specimen Description BLOOD LEFT ANTECUBITAL  Final   Special Requests BOTTLES DRAWN AEROBIC AND ANAEROBIC 10CC  Final   Culture   Final           BLOOD CULTURE RECEIVED NO GROWTH TO DATE CULTURE WILL BE HELD FOR 5 DAYS BEFORE ISSUING A FINAL NEGATIVE REPORT Performed at [facility]    Report Status PENDING  Incomplete  Body fluid culture     Status: None (Preliminary result)   Collection Time: 02/12/14  4:04 PM  Result Value Ref Range Status   Specimen Description THORACENTESIS  Final   Special Requests Normal  Final   Gram Stain   Final    RARE WBC PRESENT, PREDOMINANTLY PMN NO  ORGANISMS SEEN Performed at [facility]    Culture   Final    NO GROWTH 2 DAYS Performed at [facility]    Report Status PENDING  Incomplete  Clostridium Difficile by PCR     Status: None   Collection Time: 02/13/14  9:51 PM  Result Value Ref Range Status   C difficile by pcr NEGATIVE NEGATIVE Final    Medical History: Past Medical History  Diagnosis Date  . Alcohol abuse   . Depression   . Anxiety   . Pneumonia     Medications:  Scheduled:  . antiseptic oral rinse  7 mL Mouth Rinse QID  . chlorhexidine  15 mL Mouth Rinse BID  . clonazePAM  1 mg Per Tube BID  . feeding supplement (OSMOLITE 1.5  CAL)  350 mL Per Tube QID  . feeding supplement (PRO-STAT SUGAR FREE 64)  30 mL Per Tube Q1200  . fentaNYL  75 mcg Transdermal Q72H  . ferrous sulfate  300 mg Per Tube BID WC  . folic acid  1 mg Per Tube Daily  . ipratropium-albuterol  3 mL Nebulization Q6H  . metoprolol tartrate  25 mg Per Tube BID  . multivitamin  5 mL Per Tube Daily  . pantoprazole sodium  40 mg Per Tube Daily  . piperacillin-tazobactam (ZOSYN)  IV  3.375 g Intravenous Q8H  . sodium chloride  10-40 mL Intracatheter Q12H  . thiamine  100 mg Per Tube Daily  . vancomycin  750 mg Intravenous Q8H    Assessment: 44 yo male on day 4 zosyn/vancomycin for HCAP.  WBC= 13.6, afeb, SCr= 0.92 and CrCl ~ 100. A vancomycin trough today was 29.6 and is above goal. Noted plans to d/c antibiotics based on pleural fluid cultures.  1/18 zosyn>> 1/18 vanc>>  1/18 resp- rare candida alb 1/18 blood x2- ngtd 1/18 thoracentesis fluid- ngtd  Goal of Therapy:  Vancomycin trough level 15-20 mcg/ml  Plan:  -No zosyn changes needed -Change vancomycin to 750mg  IV q12h -Will follow plans for LOT -Will follow renal function, cultures and clinical progress  Hildred Laser, Pharm D 02/15/2014 9:44 PM

## 2014-02-16 ENCOUNTER — Encounter (HOSPITAL_COMMUNITY): Payer: Self-pay

## 2014-02-16 ENCOUNTER — Inpatient Hospital Stay (HOSPITAL_COMMUNITY): Payer: Medicaid Other

## 2014-02-16 DIAGNOSIS — E46 Unspecified protein-calorie malnutrition: Secondary | ICD-10-CM

## 2014-02-16 LAB — TYPE AND SCREEN
ABO/RH(D): O POS
Antibody Screen: NEGATIVE
UNIT DIVISION: 0
Unit division: 0

## 2014-02-16 LAB — GLUCOSE, CAPILLARY
GLUCOSE-CAPILLARY: 116 mg/dL — AB (ref 70–99)
GLUCOSE-CAPILLARY: 117 mg/dL — AB (ref 70–99)
Glucose-Capillary: 118 mg/dL — ABNORMAL HIGH (ref 70–99)
Glucose-Capillary: 112 mg/dL — ABNORMAL HIGH (ref 70–99)

## 2014-02-16 LAB — BODY FLUID CULTURE
Culture: NO GROWTH
Special Requests: NORMAL

## 2014-02-16 NOTE — Progress Notes (Signed)
PULMONARY / CRITICAL CARE MEDICINE   Name: Allen Wood MRN: [id number] DOB: 08/25/1970    ADMISSION DATE:  02/12/2014  REFERRING MD :  Letta Pate (Rehab)   CHIEF COMPLAINT:  PNA, pleural effusion Left > Right   INITIAL PRESENTATION: 44 yo male with hx ETOH initially admitted 12/21 with PNA and pneumococcal bacteremia.  He had progressive resp failure requiring intubation 12/22.  Course was c/b septic shock, seizures, ETOH w/d, anemia, pleural effusion and acute renal failure requiring CVVH.  Had difficulty with vent weaning and underwent trach placement 1/5.  He was ultimately weaned to [facility] and tx to inpt rehab 1/14.  Despite multiple thoracentesis he continues to have recurrent large L pleural effusion, now with small right effusion and probable HCAP with fever and leukocytosis and is tx back to ICU 1/18.    STUDIES:  CT chest 1/17>>>  Large left pleural effusion and moderate right pleural effusion. Dense consolidation with air bronchograms in the right lower lobe concerning for pneumonia. Compressive atelectasis in the left lower lobe.  Patchy ground-glass opacities bilaterally, likely alveolitis. CT chest 1/22 >> minimal change in loculated effusion, Chest tube in place, atelectasis left lung and R lung consolidation with mild to moderate effusion  SIGNIFICANT EVENTS: 1/6 PEG placement (stark) 1/10 L thoracentesis >>1.2L 1/16 L thoracentesis >>> 1.6L 1/18 Transferred from rehab to ICU  1/18 Lt chest tube placement >>> 1/19 Trach tube changed from 8 to 6 cuffless.   SUBJECTIVE:  Much more awake, alert today. "feels normal"  VITAL SIGNS: Temp:  [98.2 F (36.8 C)-99.3 F (37.4 C)] 98.2 F (36.8 C) (01/22 1157) Pulse Rate:  [82-98] 88 (01/22 1128) Resp:  [14-26] 16 (01/22 1128) BP: (105-128)/(57-77) 123/74 mmHg (01/22 0652) SpO2:  [93 %-98 %] 96 % (01/22 1128) FiO2 (%):  [35 %-40 %] 35 % (01/22 1128) Weight:  [67.8 kg (149 lb 7.6 oz)] 67.8 kg (149 lb 7.6 oz) (01/22  0409) HEMODYNAMICS:   VENTILATOR SETTINGS: Vent Mode:  [-]  FiO2 (%):  [35 %-40 %] 35 % INTAKE / OUTPUT:  Intake/Output Summary (Last 24 hours) at 02/16/14 1406 Last data filed at 02/16/14 0600  Gross per 24 hour  Intake   1230 ml  Output   1645 ml  Net   -415 ml    PHYSICAL EXAMINATION:  General:  Well appearing, good spirits HEENT: NCAT EOM, trach in place Pulm: diminished on left, clear on right CV: RRR, no mgr Ab: BS+, soft, nontender Ext; warm no edema Neuro: A&Ox4  LABS:  CBC  Recent Labs Lab 02/13/14 0420 02/14/14 0415 02/15/14 0530  WBC 14.8* 13.8* 13.6*  HGB 7.9* 7.5* 7.6*  HCT 23.7* 22.4* 22.8*  PLT 375 394 444*   Coag's No results for input(s): APTT, INR in the last 168 hours. BMET  Recent Labs Lab 02/13/14 0420 02/14/14 0415 02/15/14 0530  NA 136 136 133*  K 4.8 4.9 4.5  CL 105 102 99  CO2 24 26 29   BUN 15 15 14   CREATININE 0.82 0.91 0.92  GLUCOSE 90 99 142*   Electrolytes  Recent Labs Lab 02/13/14 0420 02/14/14 0415 02/15/14 0530  CALCIUM 8.4 8.2* 8.2*   Sepsis Markers  Recent Labs Lab 02/12/14 1316  LATICACIDVEN 1.0  PROCALCITON 0.17   ABG No results for input(s): PHART, PCO2ART, PO2ART in the last 168 hours. Liver Enzymes  Recent Labs Lab 02/12/14 0959 02/15/14 0530  AST 20 17  ALT 17 13  ALKPHOS 46 42  BILITOT 0.3 0.7  ALBUMIN 1.6* 1.5*   Cardiac Enzymes No results for input(s): TROPONINI, PROBNP in the last 168 hours. Glucose  Recent Labs Lab 02/16/14 0739 02/16/14 1229  GLUCAP 118* 112*    Imaging Dg Chest Port 1 View  02/15/2014   CLINICAL DATA:  Chest tube  EXAM: PORTABLE CHEST - 1 VIEW  COMPARISON:  Portable exam 0623 hr compared to 02/14/2014  FINDINGS: Tracheostomy tube projects over tracheal air column.  LEFT thoracostomy tube identified at mid chest.  LEFT jugular dual-lumen central venous catheter with distal tip projecting over cavoatrial junction.  Normal heart size, mediastinal contours  and pulmonary vascularity.  BILATERAL pleural effusions and basilar atelectasis greater on LEFT.  Upper lobes otherwise clear.  No pneumothorax or acute osseous findings.  IMPRESSION: BILATERAL pleural effusions and basilar atelectasis LEFT greater than RIGHT, not significantly changed.   Electronically Signed   By: Lavonia Dana M.D.   On: 02/15/2014 08:09    pxcr 1/19: chest tube slightly withdrawn. No Px. Interim increase in lt chest wall subq emphysema.   pxcr 1/20: chest tube in stable position from yesterday  PCXR 1/21: Stable CT position, persistent bilateral effusions.  ASSESSMENT / PLAN:  PULMONARY Trach (CCM) 1/5>> to 6 cuffless 1/19 >>> Acute on chronic respiratory failure s/p trach - initially pneumococcal CAP, bacteremia, septic shock c/b ETOH w/d and seizures.   Loculated clear pleural effusion on left > minimal improvement after chest tube 1/18; discussed risks and benefits of intrapleural TPA infusion via chest tube vs VATs I recommend TPA as he is too deconditioned for VATS Possible HCAP   1/18 Left chest tube P:   Supplemental O2 via ATC  He wants to think about intrapleural TPA Continue PMV trials, currently tolerating well Abx as below  BD's  Smoking cessation  Mobilize as much as possible  CARDIOVASCULAR HTN > improved P:  Cont po metoprolol    RENAL Hypernatremia - resolved, now hyponatremia P:   Intermittent renal function monitoring  GASTROINTESTINAL Dysphagia  Heme pos stools/ anemia - previously seen by GI  EGD with only mild duodenitis.  Protein calorie malnutrition  1/6 PEG placement >> P:   Cont feeds per PEG and PO intake PPI  Will need further outpt GI workup   HEMATOLOGIC Anemia of chronic illness.   Transfused one unit of blood 1/18 P:  F/u cbc daily hgb goal > 7  INFECTIOUS HCAP with parapneumonia pleural effusion Diarrhea. C.diff neg 1/19 P:   BCx2 1/18>>> pending UC 1/18>>> c diff 1/19 >> neg Sputum 1/18>>> candida Pleural  fluid 1/18>>>neg to date  Vancomycin 1/18>>> 1/23 Zosyn 1/18>>> 1/23  Stop vanc/zosyn 1/23 ENDOCRINE No active issue  P:   cbg monitoring  NEUROLOGIC ETOH withdrawal  Seizures - r/t above P:   Cont fentanyl duragesic  Cont clonazepam   Cont thiamine, folate, mvi    FAMILY  - Updates:  Updated wife and patient in room this afternoon   Roselie Awkward, MD [facility] Pager: [phone] Cell: [phone] If no response, call [phone]

## 2014-02-16 NOTE — Progress Notes (Signed)
Speech Language Pathology Treatment: Dysphagia;Passy Muir Speaking valve  Patient Details Name: Allen Wood MRN: [id number] DOB: 11/18/1970 Today's Date: 02/16/2014 Time: 0950-1002 SLP Time Calculation (min) (ACUTE ONLY): 12 min  Assessment / Plan / Recommendation Clinical Impression  Pt continues to improve each day. Pt tolerating PMSV all waking hours without difficulty.  Speech is fully intelligible, vocal quality is clear, breath support adequate. Will begin education re: donning/doffing and cleaning of PMSV now that pt more consistently alert, following commands, and able to participate in this aspect of therapy. Pt began full liquid diet with thin liquids yesterday. Pt appears to be tolerating this diet without overt difficulty. Pt underwent MBS 02/09/14, with evidence of penetration of large/consecutive boluses of thin liquids, and post swallow residue of solids which was reduced with dry swallows. Pt more alert and able to recall safe swallow precautions and adhere to them. Will advance diet to dys 3 with chopped meats (for energy conservation), and thin liquids, and continue to monitor for tolerance, readiness to advance further, and if repeat MBS is needed.  Given overall progress, and improvement in recall and adherence to safe swallow recommendations, as well as absence of aspiration on MBS, will defer repeat MBS at this time. ST to continue to follow.   HPI HPI: 44 yo male with hx ETOH initially admitted 12/21 with PNA and pneumococcal bacteremia. He had progressive resp failure requiring intubation 12/22. Course was c/b septic shock, seizures, ETOH w/d, anemia, pleural effusion and acute renal failure requiring CVVH. Had difficulty with vent weaning and underwent trach placement 1/5. He was ultimately weaned to [facility] and tx to inpt rehab 1/14. Pt was able to toelrate PMSV with Shiley #8 cuffed for 40-60 minute intervals. He was consuming dys 2 diet with nectar thick liquids Despite  multiple thoracentesis he continues to have recurrent large L pleural effusion, now with small right effusion and probable HCAP with fever and leukocytosis and is tx back to ICU 1/18.Current CT shows Large left pleural effusion and moderate right pleural effusion. Dense consolidation with air bronchograms in the right lower lobe concerning for pneumonia. Compressive atelectasis in the left lower lobe. Patchy ground-glass opacities bilaterally, likely alveolitis.    Pertinent Vitals Pain Assessment: No/denies pain  SLP Plan  Continue with current plan of care    Recommendations Diet recommendations: Dysphagia 3 (mechanical soft);Thin liquid (chop meats for energy conservation) Liquids provided via: Cup;Straw Medication Administration: Whole meds with puree Supervision: Patient able to self feed;Full supervision/cueing for compensatory strategies Compensations: Slow rate;Small sips/bites;Multiple dry swallows after each bite/sip Postural Changes and/or Swallow Maneuvers: Out of bed for meals;Seated upright 90 degrees      Patient may use Passy-Muir Speech Valve: During all waking hours (remove during sleep);During PO intake/meals PMSV Supervision: Intermittent       Oral Care Recommendations: Oral care Q4 per protocol Follow up Recommendations: Inpatient Rehab Plan: Continue with current plan of care    Keith B. [name] [facility], CCC-SLP [phone]  Shonna Chock 02/16/2014, 10:11 AM

## 2014-02-16 NOTE — Progress Notes (Signed)
ANTIBIOTIC CONSULT NOTE - FOLLOW UP  Pharmacy Consult for vancomcyin Indication: PNA  No Known Allergies  Patient Measurements: Height: 5\' 11"  (180.3 cm) Weight: 149 lb 7.6 oz (67.8 kg) IBW/kg (Calculated) : 75.3  Vital Signs: Temp: 98.2 F (36.8 C) (01/22 1157) Temp Source: Oral (01/22 1157) BP: 123/74 mmHg (01/22 0652) Pulse Rate: 88 (01/22 1128) Intake/Output from previous day: 01/21 0701 - 01/22 0700 In: 1710 [P.O.:360; I.V.:230; NG/GT:1020; IV Piggyback:100] Out: 2370 [Urine:2365; Drains:5] Intake/Output from this shift:    Labs:  Recent Labs  02/14/14 0415 02/15/14 0530  WBC 13.8* 13.6*  HGB 7.5* 7.6*  PLT 394 444*  CREATININE 0.91 0.92   Estimated Creatinine Clearance: 97.2 mL/min (by C-G formula based on Cr of 0.92).  Recent Labs  02/15/14 2022  Marlette 29.6*     Microbiology: Recent Results (from the past 720 hour(s))  Body fluid culture     Status: None   Collection Time: 01/23/14 12:14 PM  Result Value Ref Range Status   Specimen Description PLEURAL RIGHT  Final   Special Requests NONE  Final   Gram Stain   Final    CYTOSPIN SLIDE WBC PRESENT,BOTH PMN AND MONONUCLEAR NO ORGANISMS SEEN Performed at [facility]    Culture   Final    NO GROWTH 3 DAYS Performed at [facility]    Report Status 01/28/2014 FINAL  Final  AFB culture with smear     Status: None (Preliminary result)   Collection Time: 01/23/14 12:14 PM  Result Value Ref Range Status   Specimen Description PLEURAL RIGHT  Final   Special Requests NONE  Final   Acid Fast Smear   Final    NO ACID FAST BACILLI SEEN Performed at [facility]    Culture   Final    CULTURE WILL BE EXAMINED FOR 6 WEEKS BEFORE ISSUING A FINAL REPORT Performed at [facility]    Report Status PENDING  Incomplete  Fungus Culture with Smear     Status: None (Preliminary result)   Collection Time: 01/23/14 12:14 PM  Result Value Ref Range Status   Specimen  Description PLEURAL RIGHT  Final   Special Requests NONE  Final   Fungal Smear   Final    NO YEAST OR FUNGAL ELEMENTS SEEN Performed at [facility]    Culture   Final    CULTURE IN PROGRESS FOR FOUR WEEKS Performed at [facility]    Report Status PENDING  Incomplete  Culture, blood (routine x 2)     Status: None   Collection Time: 01/24/14 10:00 AM  Result Value Ref Range Status   Specimen Description BLOOD LEFT WRIST  Final   Special Requests BOTTLES DRAWN AEROBIC ONLY 6CC  Final   Culture   Final    NO GROWTH 5 DAYS Performed at [facility]    Report Status 01/30/2014 FINAL  Final  Culture, blood (routine x 2)     Status: None   Collection Time: 01/24/14 10:10 AM  Result Value Ref Range Status   Specimen Description BLOOD RIGHT ARM  Final   Special Requests BOTTLES DRAWN AEROBIC AND ANAEROBIC 10CC  Final   Culture   Final    NO GROWTH 5 DAYS Note: Culture results may be compromised due to an inadequate volume of blood received in culture bottles. Performed at [facility]    Report Status 01/30/2014 FINAL  Final  Urine culture     Status: None  Collection Time: 01/24/14  5:04 PM  Result Value Ref Range Status   Specimen Description URINE, CATHETERIZED  Final   Special Requests NONE  Final   Colony Count NO GROWTH Performed at [facility]   Final   Culture NO GROWTH Performed at [facility]   Final   Report Status 01/28/2014 FINAL  Final  Culture, respiratory (NON-Expectorated)     Status: None   Collection Time: 01/25/14 12:11 AM  Result Value Ref Range Status   Specimen Description TRACHEAL ASPIRATE  Final   Special Requests NONE  Final   Gram Stain   Final    ABUNDANT WBC PRESENT,BOTH PMN AND MONONUCLEAR RARE SQUAMOUS EPITHELIAL CELLS PRESENT NO ORGANISMS SEEN Performed at [facility]    Culture   Final    Non-Pathogenic Oropharyngeal-type Flora Isolated. Performed at [facility]     Report Status 01/28/2014 FINAL  Final  Culture, blood (routine x 2)     Status: None   Collection Time: 01/31/14  9:26 AM  Result Value Ref Range Status   Specimen Description BLOOD RIGHT ARM  Final   Special Requests BOTTLES DRAWN AEROBIC ONLY 4CC  Final   Culture   Final    NO GROWTH 5 DAYS Performed at [facility]    Report Status 02/06/2014 FINAL  Final  Culture, blood (routine x 2)     Status: None   Collection Time: 01/31/14  9:44 AM  Result Value Ref Range Status   Specimen Description BLOOD RIGHT HAND  Final   Special Requests BOTTLES DRAWN AEROBIC ONLY 5CC  Final   Culture   Final    NO GROWTH 5 DAYS Performed at [facility]    Report Status 02/06/2014 FINAL  Final  Clostridium Difficile by PCR     Status: None   Collection Time: 01/31/14 11:27 AM  Result Value Ref Range Status   C difficile by pcr NEGATIVE NEGATIVE Final  Urine culture     Status: None   Collection Time: 02/01/14  9:32 AM  Result Value Ref Range Status   Specimen Description URINE, CATHETERIZED  Final   Special Requests NONE  Final   Colony Count NO GROWTH Performed at [facility]   Final   Culture NO GROWTH Performed at [facility]   Final   Report Status 02/02/2014 FINAL  Final  Body fluid culture     Status: None   Collection Time: 02/04/14 12:22 PM  Result Value Ref Range Status   Specimen Description PLEURAL FLUID LEFT  Final   Special Requests NONE  Final   Gram Stain   Final    FEW WBC PRESENT,BOTH PMN AND MONONUCLEAR NO ORGANISMS SEEN Performed at [facility]    Culture   Final    NO GROWTH 3 DAYS Performed at [facility]    Report Status 02/08/2014 FINAL  Final  Culture, respiratory (NON-Expectorated)     Status: None   Collection Time: 02/09/14 10:17 AM  Result Value Ref Range Status   Specimen Description TRACHEAL ASPIRATE  Final   Special Requests NONE  Final   Gram Stain   Final    NO WBC SEEN NO SQUAMOUS EPITHELIAL  CELLS SEEN NO ORGANISMS SEEN Performed at [facility]    Culture   Final    RARE CANDIDA ALBICANS Performed at [facility]    Report Status 02/11/2014 FINAL  Final  Culture, Urine  Status: None   Collection Time: 02/09/14 10:52 AM  Result Value Ref Range Status   Specimen Description URINE, RANDOM  Final   Special Requests NONE  Final   Colony Count NO GROWTH Performed at [facility]   Final   Culture NO GROWTH Performed at [facility]   Final   Report Status 02/11/2014 FINAL  Final  MRSA PCR Screening     Status: None   Collection Time: 02/12/14 12:26 PM  Result Value Ref Range Status   MRSA by PCR NEGATIVE NEGATIVE Final    Comment:        The GeneXpert MRSA Assay (FDA approved for NASAL specimens only), is one component of a comprehensive MRSA colonization surveillance program. It is not intended to diagnose MRSA infection nor to guide or monitor treatment for MRSA infections.   Culture, blood (routine x 2)     Status: None (Preliminary result)   Collection Time: 02/12/14  1:16 PM  Result Value Ref Range Status   Specimen Description BLOOD RIGHT ANTECUBITAL  Final   Special Requests BOTTLES DRAWN AEROBIC AND ANAEROBIC 10CC  Final   Culture   Final           BLOOD CULTURE RECEIVED NO GROWTH TO DATE CULTURE WILL BE HELD FOR 5 DAYS BEFORE ISSUING A FINAL NEGATIVE REPORT Performed at [facility]    Report Status PENDING  Incomplete  Culture, respiratory (NON-Expectorated)     Status: None   Collection Time: 02/12/14  1:21 PM  Result Value Ref Range Status   Specimen Description TRACHEAL ASPIRATE  Final   Special Requests Normal  Final   Gram Stain   Final    FEW WBC PRESENT,BOTH PMN AND MONONUCLEAR RARE SQUAMOUS EPITHELIAL CELLS PRESENT RARE GRAM POSITIVE COCCI IN PAIRS Performed at [facility]    Culture   Final    RARE CANDIDA ALBICANS Performed at [facility]    Report Status 02/14/2014  FINAL  Final  Culture, blood (routine x 2)     Status: None (Preliminary result)   Collection Time: 02/12/14  1:28 PM  Result Value Ref Range Status   Specimen Description BLOOD LEFT ANTECUBITAL  Final   Special Requests BOTTLES DRAWN AEROBIC AND ANAEROBIC 10CC  Final   Culture   Final           BLOOD CULTURE RECEIVED NO GROWTH TO DATE CULTURE WILL BE HELD FOR 5 DAYS BEFORE ISSUING A FINAL NEGATIVE REPORT Performed at [facility]    Report Status PENDING  Incomplete  Body fluid culture     Status: None   Collection Time: 02/12/14  4:04 PM  Result Value Ref Range Status   Specimen Description THORACENTESIS  Final   Special Requests Normal  Final   Gram Stain   Final    RARE WBC PRESENT, PREDOMINANTLY PMN NO ORGANISMS SEEN Performed at [facility]    Culture   Final    NO GROWTH 3 DAYS Performed at [facility]    Report Status 02/16/2014 FINAL  Final  Clostridium Difficile by PCR     Status: None   Collection Time: 02/13/14  9:51 PM  Result Value Ref Range Status   C difficile by pcr NEGATIVE NEGATIVE Final    Anti-infectives    Start     Dose/Rate Route Frequency Ordered Stop   02/16/14 0849  vancomycin (VANCOCIN) IVPB 750 mg/150 ml premix     750 mg150 mL/hr over  60 Minutes Intravenous Every 12 hours 02/15/14 2147     02/12/14 2100  vancomycin (VANCOCIN) IVPB 750 mg/150 ml premix  Status:  Discontinued     750 mg150 mL/hr over 60 Minutes Intravenous Every 8 hours 02/12/14 1214 02/15/14 2147   02/12/14 1900  piperacillin-tazobactam (ZOSYN) IVPB 3.375 g     3.375 g12.5 mL/hr over 240 Minutes Intravenous Every 8 hours 02/12/14 1157     02/12/14 1300  vancomycin (VANCOCIN) 1,250 mg in sodium chloride 0.9 % 250 mL IVPB     1,250 mg166.7 mL/hr over 90 Minutes Intravenous  Once 02/12/14 1214 02/12/14 1444   02/12/14 1230  piperacillin-tazobactam (ZOSYN) IVPB 3.375 g     3.375 g100 mL/hr over 30 Minutes Intravenous  Once 02/12/14 1157 02/12/14 1310       Assessment: 44 yo male with HCAP on vancomycin and zosyn. WBC= 12.6, afebrile, SCr= 0.92, CrCl ~ 95. Per MD to stop antibiotics on 1/23.  Vancomycin changed to 750mg  IV q12hr on 1/21 due to an elevated vancomycin level on 1/21.  1/18 zosyn>> (1/23) 1/18 vanc>> (1/23)  1/18 resp- rare candida alb 1/18 blood x2- ngtd 1/18 thoracentesis fluid- neg   Goal of Therapy:  Vancomycin trough level 15-20 mcg/ml  Plan:  -No vancomycin or zosyn dose changes needed -Will discontinue vancomycin and zosyn after last doses on 02/17/14 -No further vancomycin levels needed  Hildred Laser, Pharm D 02/16/2014 2:54 PM

## 2014-02-17 ENCOUNTER — Inpatient Hospital Stay (HOSPITAL_COMMUNITY): Payer: Medicaid Other

## 2014-02-17 LAB — GLUCOSE, CAPILLARY
GLUCOSE-CAPILLARY: 114 mg/dL — AB (ref 70–99)
GLUCOSE-CAPILLARY: 136 mg/dL — AB (ref 70–99)
Glucose-Capillary: 124 mg/dL — ABNORMAL HIGH (ref 70–99)
Glucose-Capillary: 111 mg/dL — ABNORMAL HIGH (ref 70–99)

## 2014-02-17 MED ORDER — NICOTINE 7 MG/24HR TD PT24
7.0000 mg | MEDICATED_PATCH | Freq: Every day | TRANSDERMAL | Status: DC
  Administered 2014-02-17 – 2014-02-19 (×3): 7 mg via TRANSDERMAL
  Filled 2014-02-17 (×3): qty 1

## 2014-02-17 NOTE — Progress Notes (Signed)
PULMONARY / CRITICAL CARE MEDICINE   Name: Allen Wood MRN: [id number] DOB: [birth year]    ADMISSION DATE:  02/12/2014  REFERRING MD :  Letta Pate (Rehab)   CHIEF COMPLAINT:  PNA, pleural effusion Left > Right   INITIAL PRESENTATION: 44 yo male with hx ETOH initially admitted 12/21 with PNA and pneumococcal bacteremia.  He had progressive resp failure requiring intubation 12/22.  Course was c/b septic shock, seizures, ETOH w/d, anemia, pleural effusion and acute renal failure requiring CVVH.  Had difficulty with vent weaning and underwent trach placement 1/5.  He was ultimately weaned to [facility] and tx to inpt rehab 1/14.  Despite multiple thoracentesis he continues to have recurrent large L pleural effusion,   with small right effusion and probable HCAP with fever and leukocytosis >>  tx back to ICU 1/18.    STUDIES:  CT chest 1/17>>>  Large left pleural effusion and moderate right pleural effusion. Dense consolidation with air bronchograms in the right lower lobe concerning for pneumonia. Compressive atelectasis in the left lower lobe.  Patchy ground-glass opacities bilaterally, likely alveolitis. CT chest 1/22 >> minimal change in loculated effusion, Chest tube in place, atelectasis left lung and R lung consolidation with mild to moderate effusion  SIGNIFICANT EVENTS: 1/6 PEG placement (stark) 1/10 L thoracentesis >>1.2L 1/16 L thoracentesis >>> 1.6L 1/18 Transferred from rehab to ICU  1/18 Lt chest tube placement >>> 1/19 Trach tube changed from 8 to 6 cuffless.   SUBJECTIVE:  Nad, denies cp or sob  VITAL SIGNS: Temp:  [98 F (36.7 C)-99.3 F (37.4 C)] 98 F (36.7 C) (01/23 0710) Pulse Rate:  [81-109] 83 (01/23 0825) Resp:  [13-25] 13 (01/23 0825) BP: (105-123)/(63-75) 106/64 mmHg (01/23 0710) SpO2:  [92 %-99 %] 93 % (01/23 0820) FiO2 (%):  [28 %-38 %] 28 % (01/23 0825) Weight:  [150 lb 12.7 oz (68.4 kg)] 150 lb 12.7 oz (68.4 kg) (01/23 0300) HEMODYNAMICS:   VENTILATOR  SETTINGS: Vent Mode:  [-]  FiO2 (%):  [28 %-38 %] 28 % INTAKE / OUTPUT:  Intake/Output Summary (Last 24 hours) at 02/17/14 0941 Last data filed at 02/17/14 0800  Gross per 24 hour  Intake 2612.5 ml  Output   1200 ml  Net 1412.5 ml    PHYSICAL EXAMINATION:  General:  Chronically ill  appearing, good spirits HEENT: NCAT EOM, trach in place Pulm: diminished on left, clear on right, L Chest tube no drainage recorded  CV: RRR, no mgr Ab: BS+, soft, nontender Ext; warm no edema Neuro: A&Ox4  LABS:  CBC  Recent Labs Lab 02/13/14 0420 02/14/14 0415 02/15/14 0530  WBC 14.8* 13.8* 13.6*  HGB 7.9* 7.5* 7.6*  HCT 23.7* 22.4* 22.8*  PLT 375 394 444*   Coag's No results for input(s): APTT, INR in the last 168 hours. BMET  Recent Labs Lab 02/13/14 0420 02/14/14 0415 02/15/14 0530  NA 136 136 133*  K 4.8 4.9 4.5  CL 105 102 99  CO2 24 26 29   BUN 15 15 14   CREATININE 0.82 0.91 0.92  GLUCOSE 90 99 142*   Electrolytes  Recent Labs Lab 02/13/14 0420 02/14/14 0415 02/15/14 0530  CALCIUM 8.4 8.2* 8.2*   Sepsis Markers  Recent Labs Lab 02/12/14 1316  LATICACIDVEN 1.0  PROCALCITON 0.17   ABG No results for input(s): PHART, PCO2ART, PO2ART in the last 168 hours. Liver Enzymes  Recent Labs Lab 02/12/14 0959 02/15/14 0530  AST 20 17  ALT 17 13  ALKPHOS 46 42  BILITOT 0.3 0.7  ALBUMIN 1.6* 1.5*   Cardiac Enzymes No results for input(s): TROPONINI, PROBNP in the last 168 hours. Glucose  Recent Labs Lab 02/16/14 0739 02/16/14 1229 02/16/14 1630 02/16/14 1901  GLUCAP 118* 112* 116* 117*    Imaging Ct Chest Wo Contrast  02/16/2014   CLINICAL DATA:  Parapneumonic effusion  EXAM: CT CHEST WITHOUT CONTRAST  TECHNIQUE: Multidetector CT imaging of the chest was performed following the standard protocol without IV contrast.  COMPARISON:  02/11/2013  FINDINGS: THORACIC INLET/BODY WALL:  Tracheostomy tube remains well seated. There is a left IJ catheter with  tip at the upper cavoatrial junction. Visible portions of a percutaneous gastrostomy tube are in good position.  MEDIASTINUM:  Normal heart size. No cardiomegaly or pericardial effusion. Changes of anemia. An enlarged upper right peritracheal lymph node is stable from prior and considered reactive.  LUNG WINDOWS:  Patchy lung opacities are unchanged, best visualized in the upper lobes. Atelectasis in the left lower lobe is unchanged, with complete lobar collapse. Airspace disease with air bronchograms in the right lower lobe is not progressed. Interval decreased in a left pleural effusion, especially the upper portions. The fluid remains at least moderate in volume (25-50% of the left pleural space) with loculation in the lower major fissure and lower lung scalloping. A small right pleural effusion is unchanged in size and distribution (mainly layering). Small volume postoperative pneumothorax on the left. A left-sided chest tube is predominantly enveloped by lung with only its tip in contact with fluid.  UPPER ABDOMEN:  No acute findings.  OSSEOUS:  No acute fracture.  No suspicious lytic or blastic lesions.  IMPRESSION: 1. Modest decrease in a still extensive and loculated left pleural effusion. The left-sided chest tube is predominantly in contact with lung. 2. Multi focal pneumonia, most confluent in the right lower lobe, is unchanged. 3. Persistent passive collapse of the left lower lobe.   Electronically Signed   By: Jorje Guild M.D.   On: 02/16/2014 04:15  cxr reviewed 1/23 and agree with report       ASSESSMENT / PLAN:  PULMONARY Trach (CCM) 1/5>> to 6 cuffless 1/19 >>> Acute on chronic respiratory failure s/p trach - initially pneumococcal CAP, bacteremia, septic shock c/b ETOH w/d and seizures.   Loculated clear pleural effusion on left > minimal improvement after chest tube 1/18; discussed risks and benefits of intrapleural TPA infusion via chest tube vs VATs >> recommend TPA as he is too  deconditioned for VATS Possible HCAP   1/18 Left chest tube P:   Supplemental O2 via ATC  He wants to think about intrapleural TPA Continue PMV trials, currently tolerating well Abx as below  BD's  Smoking cessation  Mobilize as much as possible  CARDIOVASCULAR HTN > improved P:  Cont po metoprolol    RENAL Hypernatremia - resolved, now very mild hyponatremia P:   Intermittent renal function monitoring  GASTROINTESTINAL Dysphagia  Heme pos stools/ anemia - previously seen by GI  EGD with only mild duodenitis.  Protein calorie malnutrition  1/6 PEG placement >> P:   Cont feeds per PEG and PO intake PPI  Will need further outpt GI workup   HEMATOLOGIC Anemia of chronic illness.   Transfused one unit of blood 1/18 P:  F/u cbc daily hgb goal > 7  INFECTIOUS HCAP with parapneumonia pleural effusion Diarrhea. C.diff neg 1/19 P:   BCx2 1/18>>> pending  c diff 1/19 >> neg Sputum 1/18>>> candida Pleural fluid 1/18>>>neg  Pleural fluid 1/22 > neg   Vancomycin 1/18>>> 1/23 Zosyn 1/18>>> 1/23   ENDOCRINE No active issue  P:   cbg monitoring  NEUROLOGIC ETOH withdrawal  Seizures - r/t above P:   Cont fentanyl duragesic  Cont clonazepam   Cont thiamine, folate, mvi    FAMILY  - Updates:   No fm in room     Christinia Gully, MD Pulmonary and [place] [phone] After 5:30 PM or weekends, call [phone]

## 2014-02-18 LAB — CULTURE, BLOOD (ROUTINE X 2)
CULTURE: NO GROWTH
Culture: NO GROWTH

## 2014-02-18 LAB — GLUCOSE, CAPILLARY
GLUCOSE-CAPILLARY: 113 mg/dL — AB (ref 70–99)
GLUCOSE-CAPILLARY: 114 mg/dL — AB (ref 70–99)
Glucose-Capillary: 94 mg/dL (ref 70–99)
Glucose-Capillary: 106 mg/dL — ABNORMAL HIGH (ref 70–99)

## 2014-02-18 MED ORDER — ACETAMINOPHEN 325 MG PO TABS
650.0000 mg | ORAL_TABLET | Freq: Four times a day (QID) | ORAL | Status: DC | PRN
  Administered 2014-02-18: 650 mg via ORAL
  Filled 2014-02-18: qty 2

## 2014-02-18 NOTE — Progress Notes (Signed)
PULMONARY / CRITICAL CARE MEDICINE   Name: Allen Wood MRN: [id number] DOB: 11-24-1970    ADMISSION DATE:  02/12/2014  REFERRING MD :  Letta Pate (Rehab)   CHIEF COMPLAINT:  PNA, pleural effusion Left > Right   INITIAL PRESENTATION: 44 yo male with hx ETOH initially admitted 12/21 with PNA and pneumococcal bacteremia.  He had progressive resp failure requiring intubation 12/22.  Course was c/b septic shock, seizures, ETOH w/d, anemia, pleural effusion and acute renal failure requiring CVVH.  Had difficulty with vent weaning and underwent trach placement 1/5.  He was ultimately weaned to [facility] and tx to inpt rehab 1/14.  Despite multiple thoracentesis he continues to have recurrent large L pleural effusion,   with small right effusion and probable HCAP with fever and leukocytosis >>  tx back to ICU 1/18.    STUDIES:  CT chest 1/17>>>  Large left pleural effusion and moderate right pleural effusion. Dense consolidation with air bronchograms in the right lower lobe concerning for pneumonia. Compressive atelectasis in the left lower lobe.  Patchy ground-glass opacities bilaterally, likely alveolitis. CT chest 1/22 >> minimal change in loculated effusion, Chest tube in place, atelectasis left lung and R lung consolidation with mild to moderate effusion  SIGNIFICANT EVENTS: 1/6 PEG placement (stark) 1/10 L thoracentesis >>1.2L 1/16 L thoracentesis >>> 1.6L 1/18 Transferred from rehab to ICU  1/18 Lt chest tube placement >>> 1/19 Trach tube changed from 8 to 6 cuffless.   SUBJECTIVE:  Nad, denies cp or sob/ wants wife to participate in decision making and "you always miss her"   VITAL SIGNS: Temp:  [98.1 F (36.7 C)-99.3 F (37.4 C)] 98.1 F (36.7 C) (01/24 0815) Pulse Rate:  [82-110] 100 (01/24 0847) Resp:  [12-40] 14 (01/24 0847) BP: (103-116)/(54-71) 112/65 mmHg (01/24 0847) SpO2:  [92 %-97 %] 96 % (01/24 0847) FiO2 (%):  [28 %] 28 % (01/24 0847) Weight:  [154 lb 12.2 oz (70.2 kg)]  154 lb 12.2 oz (70.2 kg) (01/24 0318) HEMODYNAMICS:   VENTILATOR SETTINGS: Vent Mode:  [-]  FiO2 (%):  [28 %] 28 % INTAKE / OUTPUT:  Intake/Output Summary (Last 24 hours) at 02/18/14 1003 Last data filed at 02/18/14 0800  Gross per 24 hour  Intake   1520 ml  Output    870 ml  Net    650 ml    PHYSICAL EXAMINATION:  General:  Chronically ill  appearing,   HEENT: NCAT EOM, trach in place Pulm: diminished on left, clear on right, L Chest tube no drainage recorded  CV: RRR, no mgr Ab: BS+, soft, nontender Ext; warm no edema Neuro: A&Ox4  LABS:  CBC  Recent Labs Lab 02/13/14 0420 02/14/14 0415 02/15/14 0530  WBC 14.8* 13.8* 13.6*  HGB 7.9* 7.5* 7.6*  HCT 23.7* 22.4* 22.8*  PLT 375 394 444*   Coag's No results for input(s): APTT, INR in the last 168 hours. BMET  Recent Labs Lab 02/13/14 0420 02/14/14 0415 02/15/14 0530  NA 136 136 133*  K 4.8 4.9 4.5  CL 105 102 99  CO2 24 26 29   BUN 15 15 14   CREATININE 0.82 0.91 0.92  GLUCOSE 90 99 142*   Electrolytes  Recent Labs Lab 02/13/14 0420 02/14/14 0415 02/15/14 0530  CALCIUM 8.4 8.2* 8.2*   Sepsis Markers  Recent Labs Lab 02/12/14 1316  LATICACIDVEN 1.0  PROCALCITON 0.17   ABG No results for input(s): PHART, PCO2ART, PO2ART in the last 168 hours. Liver Enzymes  Recent Labs Lab  02/12/14 0959 02/15/14 0530  AST 20 17  ALT 17 13  ALKPHOS 46 42  BILITOT 0.3 0.7  ALBUMIN 1.6* 1.5*   Cardiac Enzymes No results for input(s): TROPONINI, PROBNP in the last 168 hours. Glucose  Recent Labs Lab 02/17/14 0344 02/17/14 0536 02/17/14 1244 02/17/14 1653 02/17/14 2341 02/18/14 0652  GLUCAP 124* 111* 114* 136* 114* 113*    Imaging Dg Chest Port 1 View  02/17/2014   CLINICAL DATA:  Left-sided pleural effusion  EXAM: PORTABLE CHEST - 1 VIEW  COMPARISON:  02/16/2014  FINDINGS: Cardiac shadow is stable. Left chest tube is again identified and stable in appearance. Persistent left-sided pleural  effusion is seen and stable. Left-sided temporary dialysis catheter is seen in satisfactory position. Tracheostomy tube is noted. A small right-sided pleural effusion and right basilar atelectasis is again seen.  IMPRESSION: No significant interval change from the prior exam.   Electronically Signed   By: Inez Catalina M.D.   On: 02/17/2014 08:17  cxr reviewed 1/23 and agree with report       ASSESSMENT / PLAN:  PULMONARY Trach (CCM) 1/5>> to 6 cuffless 1/19 >>> Acute on chronic respiratory failure s/p trach - initially pneumococcal CAP, bacteremia, septic shock c/b ETOH w/d and seizures.   Loculated clear pleural effusion on left > minimal improvement after chest tube 1/18; discussed risks and benefits of intrapleural TPA infusion via chest tube vs VATs >> recommend TPA as he is too deconditioned for VATS Possible HCAP   1/18 Left chest tube P:   Supplemental O2 via ATC  He wants to think about intrapleural TPA Continue PMV trials, currently tolerating well Abx as below  BD's  Smoking cessation  Mobilize as much as possible  CARDIOVASCULAR HTN > improved P:  Cont po metoprolol    RENAL Hypernatremia - resolved, now very mild hyponatremia P:   Intermittent renal function monitoring  GASTROINTESTINAL Dysphagia  Heme pos stools/ anemia - previously seen by GI  EGD with only mild duodenitis.  Protein calorie malnutrition  1/6 PEG placement >> P:   Cont feeds per PEG and PO intake PPI  Will need further outpt GI workup   HEMATOLOGIC Anemia of chronic illness.   Transfused one unit of blood 1/18 P:  F/u cbc daily hgb goal > 7  INFECTIOUS HCAP with parapneumonia pleural effusion Diarrhea. C.diff neg 1/19 P:   BCx2 1/18>>> pending  c diff 1/19 >> neg Sputum 1/18>>> candida Pleural fluid 1/18>>>neg  Pleural fluid 1/22 > neg   Vancomycin 1/18>>> 1/23 Zosyn 1/18>>> 1/23   ENDOCRINE No active issue  P:   cbg monitoring  NEUROLOGIC ETOH withdrawal  Seizures -  r/t above P:   Cont fentanyl duragesic  Cont clonazepam   Cont thiamine, folate, mvi    FAMILY  - Updates:   No fm in room  His chest tube is in the major fissure and really too high to expect much drainage, will need to consider new chest tube 1/25 or pigtail per IR to get good results from thrombolytics      Christinia Gully, MD Pulmonary and [place] [phone] After 5:30 PM or weekends, call [phone]

## 2014-02-19 ENCOUNTER — Encounter (HOSPITAL_COMMUNITY): Payer: Self-pay | Admitting: *Deleted

## 2014-02-19 DIAGNOSIS — G9340 Encephalopathy, unspecified: Secondary | ICD-10-CM

## 2014-02-19 LAB — GLUCOSE, CAPILLARY: Glucose-Capillary: 84 mg/dL (ref 70–99)

## 2014-02-19 LAB — CBC
HCT: 20.8 % — ABNORMAL LOW (ref 39.0–52.0)
HCT: 24.1 % — ABNORMAL LOW (ref 39.0–52.0)
HEMOGLOBIN: 8.2 g/dL — AB (ref 13.0–17.0)
Hemoglobin: 6.9 g/dL — CL (ref 13.0–17.0)
MCH: 31.8 pg (ref 26.0–34.0)
MCH: 32.4 pg (ref 26.0–34.0)
MCHC: 33.2 g/dL (ref 30.0–36.0)
MCHC: 34 g/dL (ref 30.0–36.0)
MCV: 95.9 fL (ref 78.0–100.0)
MCV: 95.3 fL (ref 78.0–100.0)
PLATELETS: 504 10*3/uL — AB (ref 150–400)
Platelets: 513 10*3/uL — ABNORMAL HIGH (ref 150–400)
RBC: 2.17 MIL/uL — AB (ref 4.22–5.81)
RBC: 2.53 MIL/uL — ABNORMAL LOW (ref 4.22–5.81)
RDW: 14.2 % (ref 11.5–15.5)
RDW: 14.9 % (ref 11.5–15.5)
WBC: 8.4 10*3/uL (ref 4.0–10.5)
WBC: 9.1 10*3/uL (ref 4.0–10.5)

## 2014-02-19 LAB — BASIC METABOLIC PANEL
ANION GAP: 6 (ref 5–15)
BUN: 12 mg/dL (ref 6–23)
CHLORIDE: 101 mmol/L (ref 96–112)
CO2: 32 mmol/L (ref 19–32)
Calcium: 8.5 mg/dL (ref 8.4–10.5)
Creatinine, Ser: 0.74 mg/dL (ref 0.50–1.35)
GFR calc Af Amer: >90 mL/min (ref >90)
GLUCOSE: 92 mg/dL (ref 70–99)
Potassium: 4.1 mmol/L (ref 3.5–5.1)
Sodium: 139 mmol/L (ref 135–145)

## 2014-02-19 LAB — PREPARE RBC (CROSSMATCH)

## 2014-02-19 LAB — SEDIMENTATION RATE: Sed Rate: 122 mm/hr — ABNORMAL HIGH (ref 0–16)

## 2014-02-19 MED ORDER — SODIUM CHLORIDE 0.9 % IV SOLN: Freq: Once | INTRAVENOUS | Status: DC

## 2014-02-19 MED ORDER — TRAZODONE HCL 100 MG PO TABS
100.0000 mg | ORAL_TABLET | Freq: Every day | ORAL | Status: DC
  Administered 2014-02-19 – 2014-02-21 (×3): 100 mg
  Filled 2014-02-19 (×5): qty 1

## 2014-02-19 MED ORDER — FENTANYL 25 MCG/HR TD PT72
25.0000 ug | MEDICATED_PATCH | TRANSDERMAL | Status: DC
  Administered 2014-02-19: 25 ug via TRANSDERMAL
  Filled 2014-02-19: qty 1

## 2014-02-19 MED ORDER — CLONAZEPAM 1 MG PO TABS
1.0000 mg | ORAL_TABLET | Freq: Every day | ORAL | Status: DC
  Administered 2014-02-19 – 2014-02-21 (×3): 1 mg
  Filled 2014-02-19 (×3): qty 1

## 2014-02-19 MED ORDER — ENSURE COMPLETE PO LIQD
237.0000 mL | Freq: Three times a day (TID) | ORAL | Status: DC
  Administered 2014-02-19 – 2014-02-21 (×3): 237 mL via ORAL

## 2014-02-19 MED ORDER — CLONAZEPAM 1 MG PO TABS: 1.0000 mg | ORAL_TABLET | Freq: Every day | ORAL | Status: DC

## 2014-02-19 MED ORDER — IPRATROPIUM-ALBUTEROL 0.5-2.5 (3) MG/3ML IN SOLN: 3.0000 mL | RESPIRATORY_TRACT | Status: DC | PRN

## 2014-02-19 NOTE — Progress Notes (Signed)
CRITICAL VALUE ALERT  Critical value received:  Hgb 6.9  Date of notification:  02/19/14  Time of notification:  0510  Critical value read back:Yes.    Nurse who received alert:  Stacie Acres, RN  MD notified (1st page):  Deterding with CCM/ELink via Kathlee Nations, RN  Time of first page:  [phone]  MD notified (2nd page):  Time of second page:  Responding MD:  Deterding  Time MD responded:  [phone]

## 2014-02-19 NOTE — Progress Notes (Signed)
Utilization review completed.  

## 2014-02-19 NOTE — Progress Notes (Signed)
Trach removed per MD order.  Gauze was placed over stoma.  Sats currently 96% on room air.  Patient tolerated well.  No apparent complications.  Will continue to monitor.

## 2014-02-19 NOTE — Progress Notes (Signed)
NUTRITION FOLLOW UP  Intervention:    Ensure Complete PO TID, each supplement provides 350 kcal and 13 grams of protein  Nutrition Dx:   Inadequate oral intake now related to dysphagia as evidenced by ~50% meal completion  Goal:   Intake to meet >90% of estimated nutrition needs.  Monitor:   PO intake, labs, weight trend.  Assessment:   44 yo male with hx ETOH initially admitted 12/21 with PNA and pneumococcal bacteremia. He had progressive resp failure requiring intubation 12/22. Course was c/b septic shock, seizures, ETOH w/d, anemia, pleural effusion and acute renal failure requiring CVVH. S/P trach 1/5. S/P PEG 1/6. He was ultimately weaned to [facility] and tx to inpt rehab 1/14. Despite multiple thoracentesis he continued to have recurrent large L pleural effusion, developed small right effusion and probable HCAP with fever and leukocytosis. Was transferred back to ICU 1/18.   No longer receiving TF. Patient now receiving a dysphagia 3 diet with thin liquids per SLP recommendations. Eating ~50% of meals per discussion with patient's wife and RN. Patient agreed to try Ensure supplements TID between meals to maximize oral intake of protein and calories.  Height: Ht Readings from Last 1 Encounters:  02/12/14 5\' 11"  (1.803 m)    Weight Status:   Wt Readings from Last 1 Encounters:  02/19/14 151 lb 0.2 oz (68.5 kg)   02/13/14 159 lb 13.3 oz (72.5 kg)    Re-estimated needs:  Kcal: 2000-2200 Protein: 100-110 gm Fluid: 2-2.2 L  Skin: WDL  Diet Order: DIET DYS 3 with thin liquids   Intake/Output Summary (Last 24 hours) at 02/19/14 1420 Last data filed at 02/19/14 1300  Gross per 24 hour  Intake   1225 ml  Output   1325 ml  Net   -100 ml    Last BM: 1/24   Labs:   Recent Labs Lab 02/14/14 0415 02/15/14 0530 02/19/14 0323  NA 136 133* 139  K 4.9 4.5 4.1  CL 102 99 101  CO2 26 29 32  BUN 15 14 12   CREATININE 0.91 0.92 0.74  CALCIUM 8.2* 8.2* 8.5  GLUCOSE  99 142* 92    CBG (last 3)   Recent Labs  02/18/14 1104 02/18/14 1615 02/19/14 1315  GLUCAP 94 106* 84    Scheduled Meds: . sodium chloride   Intravenous Once  . sodium chloride   Intravenous Once  . antiseptic oral rinse  7 mL Mouth Rinse QID  . chlorhexidine  15 mL Mouth Rinse BID  . [START ON 02/20/2014] clonazePAM  1 mg Per Tube QHS  . fentaNYL  25 mcg Transdermal Q72H  . ferrous sulfate  300 mg Per Tube BID WC  . folic acid  1 mg Per Tube Daily  . metoprolol tartrate  25 mg Per Tube BID  . multivitamin  5 mL Per Tube Daily  . pantoprazole sodium  40 mg Per Tube Daily  . sodium chloride  10-40 mL Intracatheter Q12H  . thiamine  100 mg Per Tube Daily  . traZODone  100 mg Per Tube QHS    Continuous Infusions:    Molli Barrows, RD, LDN, [place] Pager [phone] After Hours Pager [phone]

## 2014-02-19 NOTE — Progress Notes (Signed)
[place] Progress Note Patient Name: Allen Wood DOB: 1970-06-04 MRN: [id number]   Date of Service  02/19/2014  HPI/Events of Note  Hgb of 6.9 down from 7.6  eICU Interventions  Plan: Transfuse 1 unit pRBC Post-transfusion CBC     Intervention Category Intermediate Interventions: Bleeding - evaluation and treatment with blood products  Tilden Broz 02/19/2014, 5:20 AM

## 2014-02-19 NOTE — Progress Notes (Signed)
NAD Somnolent RN reports day/night reversal No new complaints reported Receiving one unit PRBCs for Hgb 6.9 today No drainage from chest tube  Filed Vitals:   02/19/14 1136 02/19/14 1244 02/19/14 1245 02/19/14 1448  BP: 122/85 107/66  121/73  Pulse:  86 87 92  Temp:   98.6 F (37 C) 99.4 F (37.4 C)  TempSrc:   Oral Oral  Resp: 19   20  Height:      Weight:      SpO2: 96% 95% 94% 96%   28% FiO2 by ATC  NAD HEENT WNL Trach site clean Few rhonchi RRR s M NABS, soft, G tube site clean Ext warm without edema Neuro without focal deficit  I have reviewed all of today's lab results. Relevant abnormalities are discussed in the A/P section  CXR: NNF  IMPRESSION: Resolving acute hypoxic respiratory failure Recent severe CAP with ARDS - resolving Bilateral pleural effusions - transudates by chem analysis Nonfunctional chest tube Trach tube status G tube status - per GI, tube needs to remain a minimum of 6 wks after placement (placed 02/03/14) Recent severe delirium with agitation Now, hypersomnolence Anemia without overt acute blood loss  PLAN: Decannulate today (1/25) DC chest tube today 1/25 Repeat CXR in AM 1/26 Repeat CBC in AM 1/26 Change clonazepam to q HS only Trazodone scheduled q HS to promote sleep @ night - started 1/25 Minimize phlebotomy Cont FeSO4 Will likely be ready for transfer back to CIR 1/26 depending on appearance of effusion on CXR   Merton Border, MD ; [facility] service Mobile [phone].  After 5:30 PM or weekends, call [phone]

## 2014-02-19 NOTE — Progress Notes (Signed)
Patient to transfer to 5W26 report given to [facility] receiving RN all questions answered at this time.  Pt. VSS with no s/s of distress noted.  Patient stable for transfer.

## 2014-02-20 ENCOUNTER — Inpatient Hospital Stay (HOSPITAL_COMMUNITY): Payer: Medicaid Other

## 2014-02-20 DIAGNOSIS — D638 Anemia in other chronic diseases classified elsewhere: Secondary | ICD-10-CM

## 2014-02-20 LAB — CBC
HCT: 24.8 % — ABNORMAL LOW (ref 39.0–52.0)
Hemoglobin: 8.2 g/dL — ABNORMAL LOW (ref 13.0–17.0)
MCH: 31.1 pg (ref 26.0–34.0)
MCHC: 33.1 g/dL (ref 30.0–36.0)
MCV: 93.9 fL (ref 78.0–100.0)
Platelets: 608 10*3/uL — ABNORMAL HIGH (ref 150–400)
RBC: 2.64 MIL/uL — ABNORMAL LOW (ref 4.22–5.81)
RDW: 14.8 % (ref 11.5–15.5)
WBC: 8.6 10*3/uL (ref 4.0–10.5)

## 2014-02-20 LAB — TYPE AND SCREEN
ABO/RH(D): O POS
Antibody Screen: NEGATIVE
UNIT DIVISION: 0

## 2014-02-20 LAB — FUNGUS CULTURE W SMEAR: FUNGAL SMEAR: NONE SEEN

## 2014-02-20 NOTE — Progress Notes (Signed)
Speech Language Pathology Treatment: Dysphagia  Patient Details Name: Allen Wood MRN: [id number] DOB: 07-24-1970 Today's Date: 02/20/2014 Time: 8295-6213; 0865-7846 SLP Time Calculation (min) (ACUTE ONLY): 10 min; 15 min.  Assessment / Plan / Recommendation Clinical Impression  Pt decannulated yesterday.  Stoma nearly closed.  Voice strong; pt voices concerns about his swallowing, but tolerated large, successive boluses of thin liquid with no s/s of aspiration.  Consuming regular solids without deficit.  Recommend advancing diet to regular consistency with thin liquids.  Pt with persisting mild cognitive deficits specific to short-term/working memory.  May benefit from SLP cognitive evaluation once back on CIR.  No further dysphagia f/u warranted.  Will sign off.        HPI HPI: 44 yo male with hx ETOH initially admitted 12/21 with PNA and pneumococcal bacteremia. He had progressive resp failure requiring intubation 12/22. Course was c/b septic shock, seizures, ETOH w/d, anemia, pleural effusion and acute renal failure requiring CVVH. Had difficulty with vent weaning and underwent trach placement 1/5. He was ultimately weaned to [facility] and tx to inpt rehab 1/14. Pt was able to tolerate PMSV with Shiley #8 cuffed for 40-60 minute intervals. He was consuming dys 2 diet with nectar thick liquids.  D/Cd back to acute care 1/18  due to recurrent large L pleural effusion, with small right effusion and probable HCAP with fever and leukocytosis.   Pertinent Vitals Pain Assessment: No/denies pain  SLP Plan       Recommendations Diet recommendations: Thin liquid;regular Liquids provided via: Cup;Straw Medication Administration: Whole meds with puree Supervision: Patient able to self feed                  Allen Mellone L. Tivis Ringer, [place] CCC/SLP Pager [phone]      Allen Wood 02/20/2014, 11:30 AM

## 2014-02-20 NOTE — Progress Notes (Signed)
Rehab admissions - Patient known to be from previous brief stay on inpatient rehab.  I am following along to determine appropriateness for potential re admit to inpatient rehab once he is medically ready.  Please re order PT and OT when you feel it is appropriate.  Patient currently not receiving PT or OT services.  Call me for questions.  [phone]

## 2014-02-20 NOTE — Evaluation (Addendum)
Occupational Therapy Evaluation Patient Details Name: Allen Wood MRN: [id number] DOB: [birth year] Today's Date: 02/20/2014    History of Present Illness 44 yo male with hx ETOH initially admitted 12/21 with PNA and pneumococcal bacteremia.  He had progressive resp failure requiring intubation 12/22.  Course was c/b septic shock, seizures, ETOH w/d, anemia, pleural effusion and acute renal failure requiring CVVH.  Had difficulty with vent weaning and underwent trach placement 1/5.  He was ultimately weaned to [facility] and tx to inpt rehab 1/14.  Despite multiple thoracentesis he continues to have recurrent large L pleural effusion, now with small right effusion and probable HCAP with fever and leukocytosis and went back to ICU 1/18   Clinical Impression   Pt admitted with above. Pt independent with ADLs, prior to hospitalization. Feel pt will benefit from acute OT to increase independence, strength and activity tolerance prior to d/c. Recommending CIR.    Follow Up Recommendations  CIR    Equipment Recommendations  Tub/shower bench    Recommendations for Other Services Rehab consult     Precautions / Restrictions Precautions Precautions: Fall Restrictions Weight Bearing Restrictions: No      Mobility Bed Mobility Overal bed mobility: Modified Independent                Transfers Overall transfer level: Needs assistance   Transfers: Sit to/from Stand Sit to Stand: Supervision                   ADL Overall ADL's : Needs assistance/impaired     Grooming: Wash/dry face;Standing;Set up;Supervision/safety   Upper Body Bathing: Set up;Supervision/ safety;Standing           Lower Body Dressing: Set up;Supervision/safety;Sit to/from stand   Toilet Transfer: Min guard;Minimal assistance;Ambulation (bed)           Functional mobility during ADLs: Min guard;Minimal assistance (Min A initially for ambulation but then did better.) General ADL Comments: Pt  able to donn/doff socks. Pt ambulated to sink and performed grooming task. Pt also ambulated in hallway. Discussed d/c options.  Explained benefit of therapy.     Vision                     Perception     Praxis      Pertinent Vitals/Pain Pain Assessment: 0-10 Pain Score: 2  Pain Location: chest when he inhales Pain Descriptors / Indicators: Aching Pain Intervention(s): Monitored during session     Hand Dominance     Extremity/Trunk Assessment Upper Extremity Assessment Upper Extremity Assessment: Overall WFL for tasks assessed   Lower Extremity Assessment Lower Extremity Assessment: Defer to PT evaluation       Communication Communication Communication: No difficulties   Cognition Arousal/Alertness: Awake/alert Behavior During Therapy: WFL for tasks assessed/performed Overall Cognitive Status: Within Functional Limits for tasks assessed                     General Comments       Exercises       Shoulder Instructions      Home Living Family/patient expects to be discharged to:: Private residence Living Arrangements: Spouse/significant other;Children Available Help at Discharge: Family;Available 24 hours/day Type of Home: House Home Access: Stairs to enter [employer] of Steps: 5 Entrance Stairs-Rails: Right;Left;Can reach both Home Layout: One level     Bathroom Shower/Tub: Tub/shower unit Shower/tub characteristics: Architectural technologist: Standard            Lives With:  Spouse (children)    Prior Functioning/Environment Level of Independence: Independent        Comments: prior to admission to hospital; was in CIR and then admitted to ICU    OT Diagnosis: Generalized weakness;Acute pain   OT Problem List: Decreased strength;Decreased activity tolerance;Impaired balance (sitting and/or standing);Decreased knowledge of use of DME or AE;Decreased knowledge of precautions;Pain   OT Treatment/Interventions:  Self-care/ADL training;Therapeutic exercise;DME and/or AE instruction;Therapeutic activities;Patient/family education;Balance training    OT Goals(Current goals can be found in the care plan section) Acute Rehab OT Goals Patient Stated Goal: not stated OT Goal Formulation: With patient Time For Goal Achievement: 02/27/14 Potential to Achieve Goals: Good ADL Goals Pt Will Perform Upper Body Bathing: with modified independence;standing Pt Will Perform Lower Body Bathing: with modified independence;sit to/from stand Pt Will Perform Lower Body Dressing: with modified independence;sit to/from stand Pt Will Transfer to Toilet: with modified independence;ambulating;regular height toilet  OT Frequency: Min 2X/week   Barriers to D/C:            Co-evaluation              End of Session Equipment Utilized During Treatment: Gait belt Nurse Communication: Other (comment);Mobility status  Activity Tolerance: Patient tolerated treatment well Patient left: in bed;with call bell/phone within reach;with bed alarm set;with family/visitor present   Time: 9169-4503 OT Time Calculation (min): 18 min Charges:  OT General Charges $OT Visit: 1 Procedure OT Evaluation $Initial OT Evaluation Tier I: 1 Procedure G-CodesBenito Mccreedy OTR/L [phone] 02/20/2014, 5:40 PM

## 2014-02-20 NOTE — Progress Notes (Signed)
NAD Awake and alert, eating pancakes Chest tube and trach out. Ready for rehab again  Filed Vitals:   02/19/14 1448 02/19/14 2213 02/20/14 0426 02/20/14 0545  BP: 121/73 126/79  121/77  Pulse: 92 98  97  Temp: 99.4 F (37.4 C) 98.8 F (37.1 C)  98.7 F (37.1 C)  TempSrc: Oral Oral  Oral  Resp: 20 24  18   Height:      Weight:   144 lb 6.4 oz (65.5 kg)   SpO2: 96% 93%  89%   Room air   NAD HEENT WNL Trach site clean Few rhonchi RRR s M Left ct site dressing unremarable NABS, soft, G tube site clean Ext warm without edema Neuro without focal deficit    Dg Chest Port 1 View  02/20/2014   CLINICAL DATA:  Respiratory failure  EXAM: PORTABLE CHEST - 1 VIEW  COMPARISON:  Portable chest x-ray of February 17, 2014  FINDINGS: There is a persistent moderate size left pleural effusion. The left chest tube has been removed. There is no definite pneumothorax. On the right the lung is better aerated. There is a small pleural effusion inferior laterally with persistent increased interstitial density at the right lung base. The mediastinum is not shifted. The heart and pulmonary vascularity are normal. The tracheostomy appliance and left internal jugular dialysis catheter have been removed.  IMPRESSION: 1. Interval removal of the left chest tube. There remains a moderate size left pleural effusion. No definite pneumothorax is demonstrated on this reportedly upright film. 2. Small stable right pleural effusion with right basilar subsegmental atelectasis.   Electronically Signed   By: Lahari Suttles  [place]   On: 02/20/2014 08:16     IMPRESSION: Resolving acute hypoxic respiratory failure(re4solved) Recent severe CAP with ARDS - resolved Bilateral pleural effusions - transudates by chem analysis better. Nonfunctional chest tube(dc 1/25) Trach tube status(trach out) G tube status - per GI, tube needs to remain a minimum of 6 wks after placement (placed 02/03/14) Recent severe delirium with  agitation(resolved) Now, ready for return to rehab Anemia without overt acute blood loss(resolved)  Recent Labs  02/19/14 1400 02/20/14 0752  HGB 8.2* 8.2*    PLAN: Decannulate(1/25) DC chest tube  1/25 Repeat CXR in AM 1/26 Repeat CBC in AM 1/26 Change clonazepam to q HS only Trazodone scheduled q HS to promote sleep @ night - started 1/25 Minimize phlebotomy Cont FeSO4 Medically clear for transfer back to CIR 1/26 , rehab contacted 1/26. PT/OT restarted 1/26.   Richardson Landry Minor ACNP [facility] PCCM Pager [phone] till 3 pm If no answer page [phone] 02/20/2014, 10:20 AM  Merton Border, MD ; [facility] service Mobile [phone].  After 5:30 PM or weekends, call [phone]

## 2014-02-21 DIAGNOSIS — R29898 Other symptoms and signs involving the musculoskeletal system: Secondary | ICD-10-CM

## 2014-02-21 MED ORDER — BOOST / RESOURCE BREEZE PO LIQD
1.0000 | Freq: Three times a day (TID) | ORAL | Status: DC
  Administered 2014-02-21 (×2): 1 via ORAL

## 2014-02-21 NOTE — Progress Notes (Signed)
Rehab admissions - I spoke with rehab MD, Dr. Letta Pate and with rehab PA, Pam.  Patient is now doing so well that he does not need an acute inpatient rehab admission.  He can discharge home with [facility] or he can go to SNF if preferred.  He walked with minguard assist 180 ft today.  I will discuss with patient and let him know his options.  Call me for questions.  #[phone]

## 2014-02-21 NOTE — Evaluation (Signed)
Physical Therapy Evaluation Patient Details Name: Allen Wood MRN: [id number] DOB: 22-Jul-1970 Today's Date: 02/21/2014   History of Present Illness  44 yo male with hx ETOH initially admitted 12/21 with PNA and pneumococcal bacteremia.  He had progressive resp failure requiring intubation 12/22.  Course was c/b septic shock, seizures, ETOH w/d, anemia, pleural effusion and acute renal failure requiring CVVH.  Had difficulty with vent weaning and underwent trach placement 1/5.  He was ultimately weaned to [facility] and tx to inpt rehab 1/14.  Despite multiple thoracentesis he continues to have recurrent large L pleural effusion, now with small right effusion and probable HCAP with fever and leukocytosis and went back to ICU 1/18  Clinical Impression  Ready to continue with intensity of CIR    Follow Up Recommendations CIR    Equipment Recommendations       Recommendations for Other Services       Precautions / Restrictions        Mobility  Bed Mobility                  Transfers Overall transfer level: Needs assistance   Transfers: Sit to/from Stand Sit to Stand: Supervision            Ambulation/Gait Ambulation/Gait assistance: Min guard Ambulation Distance (Feet): 180 Feet   Gait Pattern/deviations: Step-through pattern;Drifts right/left (some mild staggering with scanning) Gait velocity: prefers slower, but able to increase speed on cue   General Gait Details: variable steadiness with mild unsteadiness with scanning, directional changes  Stairs Stairs: Yes Stairs assistance: Min guard Stair Management: One rail Right;Alternating pattern Number of Stairs: 4 General stair comments: staggered on stairs with pain, but recovered without assist  Wheelchair Mobility    Modified Rankin (Stroke Patients Only)       Balance Overall balance assessment: Needs assistance Sitting-balance support: No upper extremity supported Sitting balance-Leahy Scale: Fair     Standing balance support: No upper extremity supported Standing balance-Leahy Scale: Fair                   Standardized Balance Assessment Standardized Balance Assessment : Dynamic Gait Index   Dynamic Gait Index Level Surface: Mild Impairment Change in Gait Speed: Mild Impairment Gait with Horizontal Head Turns: Mild Impairment Gait and Pivot Turn: Mild Impairment Step Over Obstacle: Normal Step Around Obstacles: Mild Impairment Steps: Mild Impairment       Pertinent Vitals/Pain Pain Assessment: Faces Faces Pain Scale: Hurts even more Pain Location: knees going up stairs. Pain Descriptors / Indicators: Aching;Grimacing Pain Intervention(s): Monitored during session    Home Living Family/patient expects to be discharged to:: Private residence Living Arrangements: Spouse/significant other;Children Available Help at Discharge: Family;Available 24 hours/day Type of Home: House Home Access: Stairs to enter Entrance Stairs-Rails: Right;Left;Can reach both Entrance Stairs-Number of Steps: 5 Home Layout: One level Home Equipment: None Additional Comments: information gathered from PT note (per pt's spouse)    Prior Function Level of Independence: Independent         Comments: prior to admission to hospital; was in CIR and then admitted to ICU     Hand Dominance   Dominant Hand: Right    Extremity/Trunk Assessment   Upper Extremity Assessment: Defer to OT evaluation           Lower Extremity Assessment: Overall WFL for tasks assessed (some proximalTruncal weakness)      Cervical / Trunk Assessment:  (som lower truncal weakness)  Communication   Communication: No difficulties  Cognition Arousal/Alertness: Awake/alert Behavior During Therapy: WFL for tasks assessed/performed Overall Cognitive Status: Within Functional Limits for tasks assessed                      General Comments      Exercises        Assessment/Plan    PT  Assessment All further PT needs can be met in the next venue of care  PT Diagnosis Generalized weakness   PT Problem List Decreased strength;Decreased activity tolerance;Decreased balance;Decreased mobility  PT Treatment Interventions Gait training;Therapeutic exercise;Therapeutic activities;Functional mobility training;Balance training;Neuromuscular re-education;Patient/family education;Cognitive remediation   PT Goals (Current goals can be found in the Care Plan section) Acute Rehab PT Goals Patient Stated Goal: not stated PT Goal Formulation: With family Time For Goal Achievement: 02/28/14 Potential to Achieve Goals: Good    Frequency Min 3X/week   Barriers to discharge        Co-evaluation               End of Session   Activity Tolerance: Patient tolerated treatment well Patient left: in chair;with call bell/phone within reach;with family/visitor present Nurse Communication: Mobility status         Time: 1140-1156 PT Time Calculation (min) (ACUTE ONLY): 16 min   Charges:   PT Evaluation $Initial PT Evaluation Tier I: 1 Procedure     PT G Codes:        Gervis Gaba, Tessie Fass 02/21/2014, 12:07 PM  02/21/2014  Donnella Sham, [place] [phone]  (pager)

## 2014-02-21 NOTE — Progress Notes (Signed)
Nutrition Brief Note  Notified by RN that patient does not like Ensure supplements, but he does like Resource Breeze supplements. Will d/c Ensure Complete and start Resource Breeze PO TID, each supplement provides 250 kcal and 9 grams of protein. RD to continue to follow and assist with maximizing oral intake.  Molli Barrows, RD, LDN, [place] Pager [phone] After Hours Pager [phone]

## 2014-02-21 NOTE — Progress Notes (Signed)
NAD Awake and alert, eating pancakes Chest tube and trach out. Ready for rehab again  Filed Vitals:   02/20/14 0545 02/20/14 1307 02/20/14 2135 02/21/14 0517  BP: 121/77 102/69 121/82 118/74  Pulse: 97 94 99 95  Temp: 98.7 F (37.1 C) 98.8 F (37.1 C) 99.2 F (37.3 C) 98.8 F (37.1 C)  TempSrc: Oral Oral Oral Oral  Resp: 18 20 20 20   Height:      Weight:    142 lb 13.7 oz (64.8 kg)  SpO2: 89% 94% 94% 91%   Room air   NAD, reports catch with deep breath HEENT WNL Trach site clean Few rhonchi RRR s M Left ct site dressing unremarable NABS, soft, G tube site clean Ext warm without edema Neuro without focal deficit    Dg Chest Port 1 View  02/20/2014   CLINICAL DATA:  Respiratory failure  EXAM: PORTABLE CHEST - 1 VIEW  COMPARISON:  Portable chest x-ray of February 17, 2014  FINDINGS: There is a persistent moderate size left pleural effusion. The left chest tube has been removed. There is no definite pneumothorax. On the right the lung is better aerated. There is a small pleural effusion inferior laterally with persistent increased interstitial density at the right lung base. The mediastinum is not shifted. The heart and pulmonary vascularity are normal. The tracheostomy appliance and left internal jugular dialysis catheter have been removed.  IMPRESSION: 1. Interval removal of the left chest tube. There remains a moderate size left pleural effusion. No definite pneumothorax is demonstrated on this reportedly upright film. 2. Small stable right pleural effusion with right basilar subsegmental atelectasis.   Electronically Signed   By: Jivan Symanski  [place]   On: 02/20/2014 08:16     IMPRESSION: Resolving acute hypoxic respiratory failure(re4solved) Recent severe CAP with ARDS - resolved Bilateral pleural effusions - transudates by chem analysis better. Nonfunctional chest tube(dc 1/25) Trach tube status(trach out) G tube status - per GI, tube needs to remain a minimum of 6 wks after  placement (placed 02/03/14) Recent severe delirium with agitation(resolved) Now, ready for return to rehab Anemia without overt acute blood loss(resolved)  Recent Labs  02/19/14 1400 02/20/14 0752  HGB 8.2* 8.2*    PLAN: Decannulate(1/25) DC chest tube  1/25 Repeat CXR in AM 1/26 Repeat CBC in AM 1/26 Change clonazepam to q HS only Trazodone scheduled q HS to promote sleep @ night - started 1/25 Minimize phlebotomy Cont FeSO4 Medically clear for transfer back to CIR 1/26 , rehab contacted 1/26. PT/OT restarted 1/26. 1/27 await rehab input.   Richardson Landry Minor ACNP Maryanna Shape PCCM Pager [phone] till 3 pm If no answer page [phone] 02/21/2014, 9:22 AM  Merton Border, MD ; [facility] service Mobile [phone].  After 5:30 PM or weekends, call [phone]

## 2014-02-21 NOTE — Progress Notes (Signed)
[facility] Progress Note Patient Name: Allen Wood DOB: 05-20-1970 MRN: [id number]   Date of Service  02/21/2014  HPI/Events of Note  Called by case manager and notified that recs of PT and PM&R are that pt is ok for home discharge with [facility] assistance instead of to CIR. Will order [facility] resources today, plan on possible d/c to home on 1/298.   eICU Interventions       Intervention Category Intermediate Interventions: Communication with other healthcare providers and/or family  BYRUM,ROBERT S. 02/21/2014, 3:50 PM

## 2014-02-22 ENCOUNTER — Other Ambulatory Visit: Payer: Self-pay | Admitting: Internal Medicine

## 2014-02-22 DIAGNOSIS — J9601 Acute respiratory failure with hypoxia: Secondary | ICD-10-CM

## 2014-02-22 DIAGNOSIS — J90 Pleural effusion, not elsewhere classified: Secondary | ICD-10-CM

## 2014-02-22 DIAGNOSIS — J189 Pneumonia, unspecified organism: Secondary | ICD-10-CM

## 2014-02-22 DIAGNOSIS — R6520 Severe sepsis without septic shock: Secondary | ICD-10-CM

## 2014-02-22 DIAGNOSIS — A419 Sepsis, unspecified organism: Principal | ICD-10-CM

## 2014-02-22 MED ORDER — METOPROLOL TARTRATE 50 MG PO TABS: 25.0000 mg | ORAL_TABLET | Freq: Two times a day (BID) | ORAL | Status: DC

## 2014-02-22 MED ORDER — CLONAZEPAM 1 MG PO TABS: 1.0000 mg | ORAL_TABLET | Freq: Every day | ORAL | Status: DC

## 2014-02-22 MED ORDER — TRAZODONE HCL 100 MG PO TABS: 100.0000 mg | ORAL_TABLET | Freq: Every day | ORAL | Status: DC

## 2014-02-22 NOTE — Discharge Summary (Addendum)
Physician Discharge Summary  Patient ID: Allen Wood MRN: [id number] DOB/AGE: 1970/12/17 44 y.o.  Admit date: 02/12/2014 Discharge date: 02/22/2014  Problem List Active Problems:   Anemia, chronic disease   Pleural effusion   Respiratory failure   HCAP (healthcare-associated pneumonia)   Severe sepsis  HPI: 44 yo male with hx ETOH initially admitted 12/21 with PNA and pneumococcal bacteremia. He had progressive resp failure requiring intubation 12/22. Course was c/b septic shock, seizures, ETOH w/d, anemia, pleural effusion and acute renal failure requiring CVVH. Had difficulty with vent weaning and underwent trach placement 1/5. He was ultimately weaned to [facility] and tx to inpt rehab 1/14. Despite multiple thoracentesis he continues to have recurrent large L pleural effusion, now with small right effusion and probable HCAP with fever and leukocytosis and is tx back to ICU 1/18. Pt is awake and alert. States he feels "ok right now" but intermittent malaise, fatigue, SOB. Productive cough but difficult to expectorate out of trach. Denies chest pain, hemoptysis, orthopnea, bloody stools.  Hospital Course:  STUDIES:  CT chest 1/17>>> Large left pleural effusion and moderate right pleural effusion. Dense consolidation with air bronchograms in the right lower lobe concerning for pneumonia. Compressive atelectasis in the left lower lobe. Patchy ground-glass opacities bilaterally, likely alveolitis. CT chest 1/22 >> minimal change in loculated effusion, Chest tube in place, atelectasis left lung and R lung consolidation with mild to moderate effusion  SIGNIFICANT EVENTS: 1/6 PEG placement Fuller Plan), He will need GI follow up for removal in 4-6 weeks. 1/10 L thoracentesis >> 1.2L 1/16 L thoracentesis >> 1.6L borderline trans/exudate by LDH, protein low 1/18 Transferred from rehab to ICU  1/18 Lt chest tube placement, positioned in fissure. Never fully drained pleural effusion. Removed  1/25 1/19 Trach tube changed from 8 to 6 cuffless. Decannulated 1/25 1/27 Evaluated for return to CIR. Decision made to complete his rehab at home 1/28 discharged to home   IMPRESSION: Resolving acute hypoxic respiratory failure Recent severe CAP with ARDS - resolved Bilateral pleural effusions - borderline by chem analysis Nonfunctional chest tube(dc 1/25) Trach tube status(trach out) G tube status - per GI, tube needs to remain a minimum of 6 wks after placement (placed 02/03/14) Recent severe delirium with agitation(resolved) Now, ready for return to rehab Anemia without overt acute blood loss(resolved)  Recent Labs (last 2 labs)      Recent Labs  02/19/14 1400 02/20/14 0752  HGB 8.2* 8.2*      PLAN: Decannulate(1/25) DC chest tube 1/25, he will have sutures removed 2/5 by T. Parrett ANP-BC 2/5 Change clonazepam to q HS only Trazodone scheduled q HS to promote sleep @ night - started 1/25 Minimize phlebotomy Medically clear for discharge home. He will need appointment with [facility], Dr. Fuller Plan for PEG removal. [phone], please arranged at post hospital follow up.Marland Kitchen He has home health set up at time of discharge.    Labs at discharge Lab Results  Component Value Date   CREATININE 0.74 02/19/2014   BUN 12 02/19/2014   NA 139 02/19/2014   K 4.1 02/19/2014   CL 101 02/19/2014   CO2 32 02/19/2014   Lab Results  Component Value Date   WBC 8.6 02/20/2014   HGB 8.2* 02/20/2014   HCT 24.8* 02/20/2014   MCV 93.9 02/20/2014   PLT 608* 02/20/2014   Lab Results  Component Value Date   ALT 13 02/15/2014   AST 17 02/15/2014   ALKPHOS 42 02/15/2014   BILITOT 0.7 02/15/2014  Lab Results  Component Value Date   INR 1.21 01/29/2014   INR 1.29 01/26/2014    Current radiology studies No results found.  Disposition: Home     Medication List    STOP taking these medications        buPROPion 150 MG 24 hr tablet  Commonly known as:  WELLBUTRIN XL      sertraline 50 MG tablet  Commonly known as:  ZOLOFT      TAKE these medications        clonazePAM 1 MG tablet  Commonly known as:  KLONOPIN  Take 1 tablet (1 mg total) by mouth at bedtime.     metoprolol 50 MG tablet  Commonly known as:  LOPRESSOR  Take 0.5 tablets (25 mg total) by mouth 2 (two) times daily.     traZODone 100 MG tablet  Commonly known as:  DESYREL  Place 1 tablet (100 mg total) into feeding tube at bedtime.           Follow-up Information    Follow up with PARRETT,TAMMY, NP On 03/02/2014.   Specialty:  Nurse Practitioner   Why:  11:00 am   Contact information:   [address] [address] [phone]        Discharged Condition: fair  Time spent on discharge greater than 40 minutes.  Vital signs at Discharge. Temp:  [98.4 F (36.9 C)-99.3 F (37.4 C)] 98.4 F (36.9 C) (01/28 0534) Pulse Rate:  [88-137] 88 (01/28 0534) Resp:  [16-18] 18 (01/28 0534) BP: (109-136)/(74-85) 123/85 mmHg (01/28 0534) SpO2:  [93 %-97 %] 93 % (01/28 0534) Weight:  [143 lb 11.8 oz (65.2 kg)] 143 lb 11.8 oz (65.2 kg) (01/28 0324) Office follow up Special Information or instructions.  He will need chest tube sutures removed during office visit. Office will set up follow up ct scan chest for recent empyema. They are aware and will call him. He was given klonopin and trazodone for sleep. He will need to have his PEG tube removed by [facility] in next 4-6 weeks, please arrange during office visit. [phone]   Signed: Richardson Landry Minor ACNP [facility] PCCM Pager [phone] till 3 pm If no answer page [phone] 02/22/2014, 11:05 AM     PCCM ATTENDING: The above assessment and plan was formulated under my direction. He will need CXR upon F/U to re-eval pleural effusions  Merton Border, MD;  PCCM service; Mobile [phone]

## 2014-02-22 NOTE — Progress Notes (Signed)
D/c instructions reviewed with pt and wife by Durwin Nora, copy of instructions given to pt, pt has scripts given by Caryl Bis. All questions were answered by staff. Pt d/c'd via wheelchair with belongings, with wife, escorted by hospital volunteer.

## 2014-02-27 ENCOUNTER — Telehealth: Payer: Self-pay | Admitting: Gastroenterology

## 2014-02-27 ENCOUNTER — Telehealth: Payer: Self-pay | Admitting: Adult Health

## 2014-02-27 NOTE — Telephone Encounter (Signed)
lmomtcb x1 for [employer]. She needs to call pt PCP. We have not seen pt in office.

## 2014-02-27 NOTE — Telephone Encounter (Signed)
I have left a message for Allen Wood that she needs to get the orders regarding his PEG from his primary or whoever is ordering the feedings.

## 2014-02-28 NOTE — Telephone Encounter (Signed)
Allen Wood returned call. She is aware to call PCP. Nothing further needed at this time.

## 2014-02-28 NOTE — Telephone Encounter (Signed)
lmtcb for Beth and for pt.

## 2014-03-02 ENCOUNTER — Ambulatory Visit (INDEPENDENT_AMBULATORY_CARE_PROVIDER_SITE_OTHER): Payer: Medicaid Other | Admitting: Adult Health

## 2014-03-02 ENCOUNTER — Encounter: Payer: Self-pay | Admitting: Adult Health

## 2014-03-02 ENCOUNTER — Ambulatory Visit (INDEPENDENT_AMBULATORY_CARE_PROVIDER_SITE_OTHER)
Admission: RE | Admit: 2014-03-02 | Discharge: 2014-03-02 | Disposition: A | Discharge status: Home / Self Care | Payer: Medicaid Other | Source: Ambulatory Visit | Attending: Adult Health | Admitting: Adult Health

## 2014-03-02 VITALS — BP 128/80 | HR 93 | Temp 98.0°F | Ht 71.0 in | Wt 145.2 lb

## 2014-03-02 DIAGNOSIS — J90 Pleural effusion, not elsewhere classified: Secondary | ICD-10-CM

## 2014-03-02 DIAGNOSIS — J189 Pneumonia, unspecified organism: Secondary | ICD-10-CM

## 2014-03-02 DIAGNOSIS — J9601 Acute respiratory failure with hypoxia: Secondary | ICD-10-CM

## 2014-03-02 NOTE — Patient Instructions (Signed)
Great job not smoking .  follow up Dr. Halford Chessman  In 4 weeks with PFT  We will set up follow up CT chest in 4 week prior to visit.  Please contact office for sooner follow up if symptoms do not improve or worsen or seek emergency care

## 2014-03-06 ENCOUNTER — Ambulatory Visit (INDEPENDENT_AMBULATORY_CARE_PROVIDER_SITE_OTHER): Payer: Medicaid Other | Admitting: Gastroenterology

## 2014-03-06 ENCOUNTER — Encounter: Payer: Self-pay | Admitting: Gastroenterology

## 2014-03-06 ENCOUNTER — Other Ambulatory Visit: Payer: Medicaid Other

## 2014-03-06 VITALS — BP 110/84 | HR 80 | Ht 71.0 in | Wt 144.0 lb

## 2014-03-06 DIAGNOSIS — R195 Other fecal abnormalities: Secondary | ICD-10-CM

## 2014-03-06 DIAGNOSIS — R633 Feeding difficulties, unspecified: Secondary | ICD-10-CM

## 2014-03-06 DIAGNOSIS — D649 Anemia, unspecified: Secondary | ICD-10-CM

## 2014-03-06 NOTE — Progress Notes (Signed)
History of Present Illness: This is a 44 yo male with hx ETOH initially hospitalized in 12/2013 with PNA and pneumococcal bacteremia.He is accompanied by his wife. He had progressive resp failure requiring intubation. Course was c/b septic shock, seizures, ETOH w/d, anemia, pleural effusion and acute renal failure requiring CVVH.Had difficulty with vent weaning and underwent trach placement 1/5.He was ultimately weaned to [facility] and tx to inpt rehab 1/14. Despite multiple thoracentesis he continued to have recurrent large L pleural effusion, now with small right effusion and probable HCAP with fever and leukocytosis and was tx back to ICU 1/18. Discharged home on 02/22/14. Feels well. Eating well. Passed SLP swallowing studies. No longer using PEG tube for feeding or meds. Request removal of PEG tube. Had heme pos stool and anemia during hospital stay.   No Known Allergies Outpatient Prescriptions Prior to Visit  Medication Sig Dispense Refill  . acetaminophen (TYLENOL) 500 MG tablet Per bottle as needed    . clonazePAM (KLONOPIN) 1 MG tablet Take 1 tablet (1 mg total) by mouth at bedtime. 30 tablet 0  . metoprolol (LOPRESSOR) 50 MG tablet Take 0.5 tablets (25 mg total) by mouth 2 (two) times daily. 60 tablet 0  . traZODone (DESYREL) 100 MG tablet Place 1 tablet (100 mg total) into feeding tube at bedtime. 30 tablet 0   No facility-administered medications prior to visit.   Past Medical History  Diagnosis Date  . Alcohol abuse   . Depression   . Anxiety   . Pneumonia    Past Surgical History  Procedure Laterality Date  . Mouth surgery      teeth removed.  . Tracheostomy  01/30/14    feinstein  . Esophagogastroduodenoscopy N/A 02/03/2014    Procedure: ESOPHAGOGASTRODUODENOSCOPY (EGD);  Surgeon: Ladene Artist, MD;  Location: [facility] ENDOSCOPY;  Service: Endoscopy;  Laterality: N/A;  bedside/trach/vent  . Peg placement N/A 02/03/2014    Procedure: PERCUTANEOUS ENDOSCOPIC GASTROSTOMY (PEG)  PLACEMENT;  Surgeon: Ladene Artist, MD;  Location: [facility] ENDOSCOPY;  Service: Endoscopy;  Laterality: N/A;   History   Social History  . Marital Status: Married    Spouse Name: N/A    Number of Children: 3  . Years of Education: N/A   Social History Main Topics  . Smoking status: Former Smoker -- 1.50 packs/day for 20 years    Types: Cigarettes    Quit date: 01/15/2014  . Smokeless tobacco: Never Used  . Alcohol Use: 0.0 oz/week    0 Not specified per week     Comment: pint- fifth of vodka a day  . Drug Use: No  . Sexual Activity: Not Currently   Other Topics Concern  . None   Social History Narrative   Family History  Problem Relation Age of Onset  . Cancer Mother     lung  . Cancer Father     kidney     Physical Exam: General: Well developed , well nourished, no acute distress Head: Normocephalic and atraumatic Eyes:  sclerae anicteric, EOMI Ears: Normal auditory acuity Mouth: No deformity or lesions Lungs: Clear throughout to auscultation Heart: Regular rate and rhythm; no murmurs, rubs or bruits Abdomen: Soft, non tender and non distended. PEG tube in place. No masses, hepatosplenomegaly or hernias noted. Normal Bowel sounds Musculoskeletal: Symmetrical with no gross deformities  Pulses:  Normal pulses noted Extremities: No clubbing, cyanosis, edema or deformities noted Neurological: Alert oriented x 4, grossly nonfocal Psychological:  Alert and cooperative. Normal mood and affect  Assessment and Recommendations:  1. Dysphagia. Feeding difficulties. PEG placed on 02/03/14. Pt taking all food and meds PO. Not using PEG at all. He requests PEG tube removal. He appears to be doing well without a need for his PEG tube so it is appropriate to remove. Remove PEG on Thursday after NPO for at least 6 hours.  2. Heme positive stool and anemia. EGD did not reveal source. Colonoscopy recommended and he declines colonoscopy at this time.  3. Alcohol abuse. DC alcohol.

## 2014-03-06 NOTE — Patient Instructions (Addendum)
Please have nothing to eat or drink 6 hours prior to your peg tube removal on 03/08/14 at 8:45am.   Thank you for choosing me and [facility].  Pricilla Riffle. Dagoberto Ligas., MD., Marval Regal  cc: Samara Snide, MD

## 2014-03-07 LAB — AFB CULTURE WITH SMEAR (NOT AT ARMC): ACID FAST SMEAR: NONE SEEN

## 2014-03-08 ENCOUNTER — Ambulatory Visit (INDEPENDENT_AMBULATORY_CARE_PROVIDER_SITE_OTHER): Payer: Medicaid Other | Admitting: Gastroenterology

## 2014-03-08 ENCOUNTER — Encounter: Payer: Self-pay | Admitting: Gastroenterology

## 2014-03-08 VITALS — BP 112/78 | HR 112 | Ht 71.0 in | Wt 141.0 lb

## 2014-03-08 DIAGNOSIS — R633 Feeding difficulties, unspecified: Secondary | ICD-10-CM

## 2014-03-08 DIAGNOSIS — Z431 Encounter for attention to gastrostomy: Secondary | ICD-10-CM

## 2014-03-08 NOTE — Progress Notes (Signed)
    History of Present Illness: This is a 44 year old male returning for PEG tube removal. NPO for 8 hours. He wants tube removed. See 2/9 office note. He is accompanied by his wife.  Current Medications, Allergies, Past Medical History, Past Surgical History, Family History and Social History were reviewed in [employer] record.  Physical Exam: General: Well developed , well nourished, no acute distress Head: Normocephalic and atraumatic Eyes:  sclerae anicteric, EOMI Ears: Normal auditory acuity Mouth: No deformity or lesions Lungs: Clear throughout to auscultation Heart: Regular rate and rhythm; no murmurs, rubs or bruits Abdomen: Soft, non tender and non distended. No masses, hepatosplenomegaly or hernias noted. Normal Bowel sounds. PEG tube removed for epigastric area without difficulty Musculoskeletal: Symmetrical with no gross deformities  Extremities: No clubbing, cyanosis, edema or deformities noted Neurological: Alert oriented x 4, grossly nonfocal Psychological:  Alert and cooperative. Normal mood and affect  Assessment and Recommendations:  1. PEG tube removal without difficulty. NPO for 2 hours then resume diet. Keep bandage over site for 24 hours then remove. Follow up with PCP. Follow up with GI as needed.

## 2014-03-08 NOTE — Patient Instructions (Signed)
cc: Shirline Frees, MD

## 2014-03-09 NOTE — Assessment & Plan Note (Signed)
Clinical improved. Chest x-ray is much improved.

## 2014-03-09 NOTE — Progress Notes (Signed)
   Subjective:    Patient ID: Allen Wood, male    DOB: 1970-03-01, 44 y.o.   MRN: [id number]  HPI 44 yo male with hx ETOH initially admitted 12/21 with PNA and pneumococcal bacteremia. He had progressive resp failure requiring intubation 12/22. Course was c/b septic shock, seizures, ETOH w/d, anemia, pleural effusion and acute renal failure requiring CVVH. Had difficulty with vent weaning and underwent trach placement 1/5. He was ultimately weaned to [facility] and tx to inpt rehab 1/14. Despite multiple thoracentesis he continues to have recurrent large L pleural effusion, CT chest 1/17>>> Large left pleural effusion and moderate right pleural effusion. Dense consolidation with air bronchograms in the right lower lobe concerning for pneumonia. Compressive atelectasis in the left lower lobe. Patchy ground-glass opacities bilaterally, likely alveolitis. CT chest 1/22 >> minimal change in loculated effusion, Chest tube in place, atelectasis left lung and R lung consolidation with mild to moderate effusion. Was decanulated on 1/25.  He was treated with aggressive antibiotics during stay.  Chest x-ray today shows near complete clearing of his by basilar effusion and infiltrates.. Since discharge he is much improved with decreased shortness of breath. He remains very weak with low energy. He has not smoked since discharge. He denies any hemoptysis, chest pain, orthopnea, PND or leg swelling Needs sutures removed .  Plans to have peg remvoed.    Review of Systems  Constitutional:   No  weight loss, night sweats,  Fevers, chills,+ fatigue, or  lassitude.  HEENT:   No headaches,  Difficulty swallowing,  Tooth/dental problems, or  Sore throat,                No sneezing, itching, ear ache, nasal congestion, post nasal drip,   CV:  No chest pain,  Orthopnea, PND, swelling in lower extremities, anasarca, dizziness, palpitations, syncope.   GI  No heartburn, indigestion, abdominal pain, nausea,  vomiting, diarrhea, change in bowel habits, loss of appetite, bloody stools.   Resp:   No chest wall deformity  Skin: no rash or lesions.  GU: no dysuria, change in color of urine, no urgency or frequency.  No flank pain, no hematuria   MS:  No joint pain or swelling.  No decreased range of motion.  No back pain.  Psych:  No change in mood or affect. No depression or anxiety.  No memory loss.          Objective:   Physical Exam GEN: A/Ox3; pleasant , NAD, thin  HEENT:  [place]/AT,  EACs-clear, TMs-wnl, NOSE-clear, THROAT-clear, no lesions, no postnasal drip or exudate noted.   NECK:  Supple w/ fair ROM; no JVD; normal carotid impulses w/o bruits; no thyromegaly or nodules palpated; no lymphadenopathy.  RESP  Decreased BS in bases no accessory muscle use, no dullness to percussion Left sided sutures removed intact without redness or drainage.   CARD:  RRR, no m/r/g  , no peripheral edema, pulses intact, no cyanosis or clubbing.  GI:   Soft & nt; nml bowel sounds; no organomegaly or masses detected.  Musco: Warm bil, no deformities or joint swelling noted.   Neuro: alert, no focal deficits noted.    Skin: Warm, no lesions or rashes         Assessment & Plan:

## 2014-03-09 NOTE — Assessment & Plan Note (Addendum)
Prolonged critical illness with pneumonia and pneumococcal bacteremia with pleural effusion Chest x-ray is much improved today. Patient will need PFTs on return CT chest set up as recommended as empyema was of concern.

## 2014-03-09 NOTE — Assessment & Plan Note (Signed)
Prolonged critical illness with pneumonia and pneumococcal bacteremia Requiring intubation and mechanical ventilator support with trach and subsequent decannulation. Patient is clinically improving.Marland Kitchen

## 2014-03-26 ENCOUNTER — Ambulatory Visit (INDEPENDENT_AMBULATORY_CARE_PROVIDER_SITE_OTHER)
Admission: RE | Admit: 2014-03-26 | Discharge: 2014-03-26 | Disposition: A | Discharge status: Home / Self Care | Payer: Medicaid Other | Source: Ambulatory Visit | Attending: Internal Medicine | Admitting: Internal Medicine

## 2014-03-26 DIAGNOSIS — J90 Pleural effusion, not elsewhere classified: Secondary | ICD-10-CM | POA: Diagnosis not present

## 2014-03-27 ENCOUNTER — Telehealth: Payer: Self-pay | Admitting: Pulmonary Disease

## 2014-03-27 NOTE — Telephone Encounter (Signed)
LM with Beth to return call [facility])

## 2014-03-27 NOTE — Telephone Encounter (Signed)
Spoke with [employer]. States that pt and his family would like the results of his CT. He is now coughing and bringing up mucus.  VS - please advise.  Thanks.

## 2014-03-28 NOTE — Telephone Encounter (Signed)
CT chest 03/26/14 >> centrilobular emphysema, patchy GGO upper lobes improved, trace scaring in LLL with minimal Lt effusion.   Will have my nurse inform pt that CT chest looks much better.  He has residual scarring in his Lt lung from episode of PNA in January.  He also had changes of emphysema in his upper lungs.  He needs to have PFT's done that were schedule on 03/02/14 and he needs to have alpha 1 anti-trypsin level checked to determine if he has inherit cause of emphysema.  Will call him back once these test results are available.

## 2014-03-28 NOTE — Telephone Encounter (Signed)
Results were discussed with the pt and his wife. Pt has upcoming appointments on 04/18/14 for PFT and ROV. They want to wait to do blood work that day as well, so they don't have to make 2 trips. Nothing further was needed.

## 2014-04-18 ENCOUNTER — Ambulatory Visit (INDEPENDENT_AMBULATORY_CARE_PROVIDER_SITE_OTHER): Payer: Medicaid Other | Admitting: Pulmonary Disease

## 2014-04-18 ENCOUNTER — Encounter: Payer: Self-pay | Admitting: Pulmonary Disease

## 2014-04-18 VITALS — BP 112/70 | HR 100 | Ht 71.0 in | Wt 158.0 lb

## 2014-04-18 DIAGNOSIS — J189 Pneumonia, unspecified organism: Secondary | ICD-10-CM

## 2014-04-18 DIAGNOSIS — J90 Pleural effusion, not elsewhere classified: Secondary | ICD-10-CM | POA: Diagnosis not present

## 2014-04-18 DIAGNOSIS — Z720 Tobacco use: Secondary | ICD-10-CM | POA: Diagnosis not present

## 2014-04-18 DIAGNOSIS — J449 Chronic obstructive pulmonary disease, unspecified: Secondary | ICD-10-CM | POA: Diagnosis not present

## 2014-04-18 LAB — PULMONARY FUNCTION TEST
DL/VA % pred: 79 %
DL/VA: 3.76 ml/min/mmHg/L
DLCO unc % pred: 64 %
DLCO unc: 21.84 ml/min/mmHg
FEF 25-75 Post: 1.6 L/sec
FEF 25-75 Pre: 1.01 L/sec
FEF2575-%CHANGE-POST: 57 %
FEF2575-%PRED-PRE: 26 %
FEF2575-%Pred-Post: 42 %
FEV1-%CHANGE-POST: 19 %
FEV1-%PRED-POST: 65 %
FEV1-%PRED-PRE: 54 %
FEV1-POST: 2.73 L
FEV1-Pre: 2.29 L
FEV1FVC-%CHANGE-POST: 10 %
FEV1FVC-%Pred-Pre: 68 %
FEV6-%Change-Post: 9 %
FEV6-%PRED-PRE: 79 %
FEV6-%Pred-Post: 86 %
FEV6-Post: 4.48 L
FEV6-Pre: 4.09 L
FEV6FVC-%Change-Post: 1 %
FEV6FVC-%Pred-Post: 100 %
FEV6FVC-%Pred-Pre: 98 %
FVC-%Change-Post: 8 %
FVC-%PRED-POST: 86 %
FVC-%Pred-Pre: 79 %
FVC-Post: 4.6 L
FVC-Pre: 4.26 L
POST FEV1/FVC RATIO: 59 %
Post FEV6/FVC ratio: 97 %
Pre FEV1/FVC ratio: 54 %
Pre FEV6/FVC Ratio: 96 %
RV % PRED: 118 %
RV: 2.39 L
TLC % pred: 94 %
TLC: 6.77 L

## 2014-04-18 NOTE — Progress Notes (Signed)
Chief Complaint  Patient presents with  . Follow-up    Review PFT.     History of Present Illness: Allen Wood is a 44 y.o. male smoker with hx of PNA and ARDS.  He is here to review his PFTs.  He continues to smoke and is not interested in quitting at this time.  He denies cough, wheeze, sputum, or chest pain.  His activity is limited more by joint pain and feeling weak >> these have been improving since he was in hospital.  He does not feel his breathing limits his activity.  TESTS: CT chest 03/26/14 >> centrilobular emphysema, scarring Rt lung base, trace airspace disease/scarring LLL PFT 04/18/14 >> FEV1 2.73 (65%), FEV1% 59, TLC 6.77 (94%), DLCO 64%, + BD   Past medical hx >> ETOH, Alcohol related seizures, Pneumococcal PNA with bacteremia + empyema + septic shock + AKI and ARDS December 2015  Past surgical hx, Medications, Allergies, Family hx, Social hx all reviewed.   Physical Exam: Blood pressure 112/70, pulse 100, height 5\' 11"  (1.803 m), weight 158 lb (71.668 kg), SpO2 99 %. Body mass index is 22.05 kg/(m^2).  General - No distress, thin ENT - No sinus tenderness, no oral exudate, no LAN, overbite Cardiac - s1s2 regular, no murmur Chest - No wheeze/rales/dullness, healed Lt chest tube scar Back - No focal tenderness Abd - Soft, non-tender Ext - No edema Neuro - Normal strength Skin - No rashes Psych - normal mood, and behavior   Assessment/Plan:  COPD with emphysema and asthma. Plan: - he would like to defer inhaler therapy or other assesments at this time  Tobacco abuse. Plan: - discussed options to help quit smoking - he is not ready to quit yet  He can f/u with his PCP and return to pulmonary if his symptoms progress   Chesley Mires, MD [place] Pager:  [phone]

## 2014-04-18 NOTE — Progress Notes (Signed)
PFT done today. 

## 2014-04-18 NOTE — Patient Instructions (Signed)
Follow up with pulmonary as needed 

## 2014-10-06 ENCOUNTER — Emergency Department (HOSPITAL_COMMUNITY)
Admission: EM | Admit: 2014-10-06 | Discharge: 2014-10-07 | Disposition: A | Discharge status: Home / Self Care | Payer: Medicaid Other | Attending: Emergency Medicine | Admitting: Emergency Medicine

## 2014-10-06 ENCOUNTER — Encounter (HOSPITAL_COMMUNITY): Payer: Self-pay | Admitting: Emergency Medicine

## 2014-10-06 ENCOUNTER — Emergency Department (HOSPITAL_COMMUNITY): Payer: Medicaid Other

## 2014-10-06 DIAGNOSIS — Z8701 Personal history of pneumonia (recurrent): Secondary | ICD-10-CM | POA: Diagnosis not present

## 2014-10-06 DIAGNOSIS — Z87891 Personal history of nicotine dependence: Secondary | ICD-10-CM | POA: Insufficient documentation

## 2014-10-06 DIAGNOSIS — F10929 Alcohol use, unspecified with intoxication, unspecified: Secondary | ICD-10-CM

## 2014-10-06 DIAGNOSIS — Z008 Encounter for other general examination: Secondary | ICD-10-CM | POA: Diagnosis present

## 2014-10-06 DIAGNOSIS — F32A Depression, unspecified: Secondary | ICD-10-CM

## 2014-10-06 DIAGNOSIS — F10129 Alcohol abuse with intoxication, unspecified: Secondary | ICD-10-CM | POA: Diagnosis not present

## 2014-10-06 DIAGNOSIS — R05 Cough: Secondary | ICD-10-CM | POA: Diagnosis not present

## 2014-10-06 DIAGNOSIS — F419 Anxiety disorder, unspecified: Secondary | ICD-10-CM | POA: Diagnosis not present

## 2014-10-06 DIAGNOSIS — Z79899 Other long term (current) drug therapy: Secondary | ICD-10-CM | POA: Diagnosis not present

## 2014-10-06 DIAGNOSIS — F329 Major depressive disorder, single episode, unspecified: Secondary | ICD-10-CM | POA: Insufficient documentation

## 2014-10-06 DIAGNOSIS — J449 Chronic obstructive pulmonary disease, unspecified: Secondary | ICD-10-CM | POA: Insufficient documentation

## 2014-10-06 DIAGNOSIS — E876 Hypokalemia: Secondary | ICD-10-CM | POA: Diagnosis not present

## 2014-10-06 HISTORY — DX: Alcohol use, unspecified with withdrawal delirium: F10.931

## 2014-10-06 HISTORY — DX: Alcohol dependence with withdrawal delirium: F10.231

## 2014-10-06 LAB — COMPREHENSIVE METABOLIC PANEL
ALBUMIN: 2.9 g/dL — AB (ref 3.5–5.0)
ALT: 18 U/L (ref 17–63)
ANION GAP: 12 (ref 5–15)
AST: 36 U/L (ref 15–41)
Alkaline Phosphatase: 66 U/L (ref 38–126)
BILIRUBIN TOTAL: 0.4 mg/dL (ref 0.3–1.2)
BUN: 12 mg/dL (ref 6–20)
CHLORIDE: 106 mmol/L (ref 101–111)
CO2: 21 mmol/L — AB (ref 22–32)
Calcium: 7.6 mg/dL — ABNORMAL LOW (ref 8.9–10.3)
Creatinine, Ser: 0.98 mg/dL (ref 0.61–1.24)
GFR calc Af Amer: >60 mL/min (ref >60)
GFR calc non Af Amer: >60 mL/min (ref >60)
GLUCOSE: 156 mg/dL — AB (ref 65–99)
POTASSIUM: 2.8 mmol/L — AB (ref 3.5–5.1)
SODIUM: 139 mmol/L (ref 135–145)
TOTAL PROTEIN: 5.1 g/dL — AB (ref 6.5–8.1)

## 2014-10-06 LAB — CBC WITH DIFFERENTIAL/PLATELET
BASOS PCT: 0 % (ref 0–1)
Basophils Absolute: 0 10*3/uL (ref 0.0–0.1)
Eosinophils Absolute: 0.1 10*3/uL (ref 0.0–0.7)
Eosinophils Relative: 2 % (ref 0–5)
HCT: 36.7 % — ABNORMAL LOW (ref 39.0–52.0)
HEMOGLOBIN: 12.5 g/dL — AB (ref 13.0–17.0)
LYMPHS ABS: 2.4 10*3/uL (ref 0.7–4.0)
LYMPHS PCT: 32 % (ref 12–46)
MCH: 33 pg (ref 26.0–34.0)
MCHC: 34.1 g/dL (ref 30.0–36.0)
MCV: 96.8 fL (ref 78.0–100.0)
MONO ABS: 0.3 10*3/uL (ref 0.1–1.0)
MONOS PCT: 4 % (ref 3–12)
NEUTROS ABS: 4.7 10*3/uL (ref 1.7–7.7)
Neutrophils Relative %: 62 % (ref 43–77)
Platelets: 164 10*3/uL (ref 150–400)
RBC: 3.79 MIL/uL — ABNORMAL LOW (ref 4.22–5.81)
RDW: 12.7 % (ref 11.5–15.5)
WBC: 7.6 10*3/uL (ref 4.0–10.5)

## 2014-10-06 LAB — RAPID URINE DRUG SCREEN, HOSP PERFORMED
AMPHETAMINES: NOT DETECTED
BARBITURATES: NOT DETECTED
BENZODIAZEPINES: NOT DETECTED
COCAINE: NOT DETECTED
Opiates: NOT DETECTED
TETRAHYDROCANNABINOL: NOT DETECTED

## 2014-10-06 LAB — ETHANOL: Alcohol, Ethyl (B): 343 mg/dL (ref <5)

## 2014-10-06 MED ORDER — ALUM & MAG HYDROXIDE-SIMETH 200-200-20 MG/5ML PO SUSP: 30.0000 mL | ORAL | Status: DC | PRN

## 2014-10-06 MED ORDER — NICOTINE 21 MG/24HR TD PT24
21.0000 mg | MEDICATED_PATCH | Freq: Every day | TRANSDERMAL | Status: DC
  Administered 2014-10-06: 21 mg via TRANSDERMAL
  Filled 2014-10-06: qty 1

## 2014-10-06 MED ORDER — LORAZEPAM 1 MG PO TABS
0.0000 mg | ORAL_TABLET | Freq: Four times a day (QID) | ORAL | Status: DC
  Administered 2014-10-07 (×3): 1 mg via ORAL
  Filled 2014-10-06 (×3): qty 1

## 2014-10-06 MED ORDER — LORAZEPAM 2 MG/ML IJ SOLN
1.0000 mg | Freq: Once | INTRAMUSCULAR | Status: AC
  Administered 2014-10-06: 1 mg via INTRAVENOUS
  Filled 2014-10-06: qty 1

## 2014-10-06 MED ORDER — POTASSIUM CHLORIDE CRYS ER 20 MEQ PO TBCR
40.0000 meq | EXTENDED_RELEASE_TABLET | Freq: Once | ORAL | Status: AC
  Administered 2014-10-06: 40 meq via ORAL
  Filled 2014-10-06: qty 2

## 2014-10-06 MED ORDER — LORAZEPAM 1 MG PO TABS: 0.0000 mg | ORAL_TABLET | Freq: Two times a day (BID) | ORAL | Status: DC

## 2014-10-06 MED ORDER — THIAMINE HCL 100 MG/ML IJ SOLN
100.0000 mg | Freq: Once | INTRAMUSCULAR | Status: AC
  Administered 2014-10-06: 100 mg via INTRAVENOUS
  Filled 2014-10-06: qty 2

## 2014-10-06 MED ORDER — POTASSIUM CHLORIDE 10 MEQ/100ML IV SOLN
10.0000 meq | Freq: Once | INTRAVENOUS | Status: AC
  Administered 2014-10-06: 10 meq via INTRAVENOUS
  Filled 2014-10-06: qty 100

## 2014-10-06 MED ORDER — MAGNESIUM SULFATE IN D5W 10-5 MG/ML-% IV SOLN
1.0000 g | Freq: Once | INTRAVENOUS | Status: AC
  Administered 2014-10-06: 1 g via INTRAVENOUS
  Filled 2014-10-06: qty 100

## 2014-10-06 MED ORDER — SODIUM CHLORIDE 0.9 % IV BOLUS (SEPSIS)
1000.0000 mL | Freq: Once | INTRAVENOUS | Status: AC
Start: 2014-10-06 — End: 2014-10-06
  Administered 2014-10-06: 1000 mL via INTRAVENOUS

## 2014-10-06 MED ORDER — ONDANSETRON HCL 4 MG PO TABS: 4.0000 mg | ORAL_TABLET | Freq: Three times a day (TID) | ORAL | Status: DC | PRN

## 2014-10-06 MED ORDER — ACETAMINOPHEN 325 MG PO TABS: 650.0000 mg | ORAL_TABLET | ORAL | Status: DC | PRN

## 2014-10-06 NOTE — ED Notes (Signed)
Reported alcohol level and ciwa score to dr. Tomi Bamberger. Md acknowledges.

## 2014-10-06 NOTE — ED Notes (Signed)
Dr. Knapp at the bedside.  
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
## 2014-10-06 NOTE — ED Notes (Addendum)
Pt reports that he feels like he needs help with his life, pt states " I'm insane."  Pt reports that he has had increased stress and feels depressed. Pt denies SI and HI ideations. Pt reports that he also needs detox from ETOH.

## 2014-10-06 NOTE — ED Notes (Signed)
343 alcohol level called in from lab.

## 2014-10-06 NOTE — BH Assessment (Signed)
Received notification of [facility] consult request. Spoke to Dr. Dorie Rank who said Pt has alcohol abuse and problems with depression and anxiety. Tele-assessment will be initiated.  Orpah Greek Anson Fret, [place], [facility], [facility] Triage Specialist [phone]

## 2014-10-06 NOTE — ED Notes (Signed)
Patient transported to CT 

## 2014-10-07 ENCOUNTER — Encounter (HOSPITAL_COMMUNITY): Payer: Self-pay | Admitting: *Deleted

## 2014-10-07 LAB — ETHANOL: Alcohol, Ethyl (B): <5 mg/dL (ref <5)

## 2014-10-07 MED ORDER — LORAZEPAM 1 MG PO TABS
1.0000 mg | ORAL_TABLET | Freq: Three times a day (TID) | ORAL | Status: DC | PRN
  Administered 2014-10-07: 1 mg via ORAL
  Filled 2014-10-07: qty 1

## 2014-10-07 MED ORDER — CHLORDIAZEPOXIDE HCL 25 MG PO CAPS: ORAL_CAPSULE | ORAL | Status: DC

## 2014-10-07 NOTE — Progress Notes (Addendum)
Per NP Lindell Spar, patient meets inpatient criteria for detox.  Patient to be referred out.  Verlon Setting, [place] Disposition staff 10/07/2014 2:20 PM

## 2014-10-07 NOTE — BH Assessment (Signed)
[facility] Assessment Progress Note   Received call from [place] at [facility] stating they needed a new ETOH level.  Informed pt's nurse, [place] at [facility].  This will be faxed once obtained to [facility].  Shaune Pascal, MS, [facility] Therapeutic Triage Specialist [facility]

## 2014-10-07 NOTE — ED Notes (Addendum)
Randall Hiss, [facility], [facility], aware pt declines inpt tx. Randall Hiss advised can request Ext[facility] to assess him tonight but none available at this tme. RN discussed w/pt risks for ETOH detox - including DT's and seizures. Pt voiced understanding. States has support from spouse and Environmental education officer.

## 2014-10-07 NOTE — ED Notes (Signed)
Pt ambulatory to desk - calling spouse to advise of d/c - she is coming to pick him up.

## 2014-10-07 NOTE — Progress Notes (Signed)
Referral has been faxed to: [facility] - per intake, fax referral for the waitlist. [facility] - per Darrick Penna, fax referral for tomorrow. [facility]  - doesn't take adult medicaid. [facility] referral completed and faxed. [facility] - left voicemail with call back number.  At capacity: [facility]  CSW will continue to seek placement.  Verlon Setting, [place] Disposition staff 10/07/2014 2:31 PM

## 2014-10-07 NOTE — ED Notes (Signed)
Dr Billy Fischer aware pt requesting to be d/c'd and will follow up w/outpt tx as he does not want inpt tx at this time.

## 2014-10-07 NOTE — ED Notes (Signed)
States he may not want inpt tx for ETOH d/t wants to smoke cigarettes and has to work. Advised pt [place] feels he may have problems w/DT's if attempts to stop drinking on his own. Pt voiced understanding. Requested to make a phone call - Pt on phone discussing w/spouse.
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
## 2014-10-07 NOTE — ED Notes (Addendum)
Pt on phone w/spouse and son - [phone]

## 2014-10-07 NOTE — Discharge Instructions (Signed)
°Emergency Department Resource Guide °1) Find a Doctor and Pay Out of Pocket °Although you won't have to find out who is covered by your insurance plan, it is a good idea to ask around and get recommendations. You will then need to call the office and see if the doctor you have chosen will accept you as a new patient and what types of options they offer for patients who are self-pay. Some doctors offer discounts or will set up payment plans for their patients who do not have insurance, but you will need to ask so you aren't surprised when you get to your appointment. ° °2) Contact Your Local Health Department °Not all health departments have doctors that can see patients for sick visits, but many do, so it is worth a call to see if yours does. If you don't know where your local health department is, you can check in your phone book. The CDC also has a tool to help you locate your state's health department, and many state websites also have listings of all of their local health departments. ° °3) Find a Walk-in Clinic °If your illness is not likely to be very severe or complicated, you may want to try a walk in clinic. These are popping up all over the country in pharmacies, drugstores, and shopping centers. They're usually staffed by nurse practitioners or physician assistants that have been trained to treat common illnesses and complaints. They're usually fairly quick and inexpensive. However, if you have serious medical issues or chronic medical problems, these are probably not your best option. ° °No Primary Care Doctor: °- Call [facility] at  832-8000 - they can help you locate a primary care doctor that  accepts your insurance, provides certain services, etc. °- Physician Referral Service- 1-800-533-3463 ° °Chronic Pain Problems: °Organization         Address  Phone   Notes  °[facility]  (336) 297-2271 Patients need to be referred by their primary care doctor.  ° °Medication  Assistance: °Organization         Address  Phone   Notes  °[place] [facility] [address] °[place], [place] [address] (336) 641-8030 --Must be a resident of [place] °-- Must have NO insurance coverage whatsoever (no Medicaid/ Medicare, etc.) °-- The pt. MUST have a primary care doctor that directs their care regularly and follows them in the community °  °[facility]  (866) 331-1348   °[employer]  (888) 892-1162   ° °Agencies that provide inexpensive medical care: °Organization         Address  Phone   Notes  °[facility]  (336) 832-8035   °[facility]    (336) 832-7272   °[facility] [address] °[place], [place] [address] (336) 832-4777   °[facility] [address], °[place] (336) 271-4999   °[facility]    (336) 373-0678   °[facility]    (336) 272-1050   °[facility] ° [address], [place] Phone:  (336) 832-4444, Fax:  (336) 832-4440 Hours of Operation:  9 am - 6 pm, M-F.  Also accepts Medicaid/Medicare and self-pay.  °[facility] ° [address], [place] Phone: (336) 832-3150, Fax: (336) [phone]. Hours of Operation:  8:30 am - 5:30 pm, M-F.  Also accepts Medicaid and self-pay.  °[facility] [place] [address], [place] Phone: (336) 878-6027   °[facility] [address], [place], [place] (336)723-1848, Ext. 123 Mondays & Thursdays: 7-9 AM.  First 15 patients are seen on a first come, first serve basis. °  ° °Medicaid-accepting [place] Providers: ° °Organization         Address  Phone   Notes  °[facility] [address], [place] (336) 641-2100 Also accepts self-pay patients.  °[facility] [address], [place] ° (336) 856-9996   °[facility] [address], [place]  (336) 288-8857   °[facility] [address] [place] [address], [place] (336) 299-7000   °Veita Bland [address], [place]  ° (336) 373-1557 Only accepts [place] Access Medicaid patients after they have their name applied to their card.  ° °Self-Pay (no insurance) in [place]: ° °Organization         Address  Phone   Notes  °Sickle Cell Patients, [facility] [address], [place] (336) 832-1970   °[facility] [address], [place] (336) 832-4400   °[facility] ° [address], [place] (336) 992-4800   °[facility]/Dr. Osei-Bonsu ° [address] [place] [address], [place] or [address], [place] (336) 841-8500 Phone number for both [place] and [place] locations is the same.  °[facility] [address], [place] (336) 299-0000   °[facility] [address] [place] [address], [place] or [address] (336) 852-7530 °(336) 878-2260   °[facility] [address], [place] (336) 350-1642, phone; (336) 294-5005, fax Sees patients 1st and 3rd Saturday of every month.  Must not qualify for public or private insurance (i.e. Medicaid, Medicare, [place] Health Choice, Veterans' Benefits) • Household income should be no more than 200% of the poverty level •The clinic cannot treat you if you are pregnant or think you are pregnant • Sexually transmitted diseases are not treated at the clinic.  ° ° °Dental Care: °Organization         Address  Phone  Notes  °[place] [facility] [address], [place] (336) 641-6152 Accepts children up to age 21 who are enrolled in Medicaid or [place] Health Choice; pregnant women with a Medicaid card; and children who have applied for Medicaid or [place] Health Choice, but were declined, whose parents can pay a reduced fee at time of service.  °[place]  [facility] [place]  [address], [place] (336) 641-7733 Accepts children up to age 21 who are enrolled in Medicaid or [place] Health Choice; pregnant women with a Medicaid card; and children who have applied for Medicaid or  Health Choice, but were declined, whose parents can pay a reduced fee at time of service.  °[facility] ° [address], [place] (336) 641-4533 Patients are seen by appointment only. Walk-ins are not accepted. [facility] will see patients 18 years of age and older. °Monday - Tuesday (8am-5pm) °Most Wednesdays (8:30-5pm) °$30 per visit, cash only  °[facility] ° [address], [place] (336) 641-4533 Patients are seen by appointment only. Walk-ins are not accepted. [facility] will see patients 18 years of age and older. °One   Wednesday Evening (Monthly: Volunteer Based).  $30 per visit, cash only  °[facility]  (919) 537-3737 for adults; Children under age 4, call Graduate Pediatric Dentistry at (919) [phone]. Children aged 4-14, please call (919) [phone] to request a pediatric application. ° Dental services are provided in all areas of dental care including fillings, crowns and bridges, complete and partial dentures, implants, gum treatment, root canals, and extractions. Preventive care is also provided. Treatment is provided to both adults and children. °Patients are selected via a lottery and there is often a waiting list. °  °[facility] [address], °[place] ° (336) 763-8833 [email or web address] °  °[facility] [address], [place], [place] (336)723-1848, Ext. 123 Second and Fourth Thursday of each month, opens at 6:30 AM; Clinic ends at 9 AM.  Patients are seen on a first-come first-served basis, and a limited number are seen during each clinic.  ° °[facility] ° [address], [place], [place] (336) 723-7904    Eligibility Requirements °You must have lived in [place], [place], or [place] counties for at least the last three months. °  You cannot be eligible for state or federal sponsored healthcare insurance, including Veterans Administration, Medicaid, or Medicare. °  You generally cannot be eligible for healthcare insurance through your employer.  °  How to apply: °Eligibility screenings are held every Tuesday and Wednesday afternoon from 1:00 pm until 4:00 pm. You do not need an appointment for the interview!  °[facility] [address], [place], [place] 336-631-2330   °[facility]  336-342-8273   °[facility]  336-703-3100   °[facility]  336-570-6415   ° °Behavioral Health Resources in the Community: °Intensive Outpatient Programs °Organization         Address  Phone  Notes  °[facility] [address], [place], [place] 336-878-6098   °[facility] [address], [place], [place] 336-832-9800   °[facility] [address], [place], [place] ° 336-882-2125   °[facility] [address],  °[place], [place] 1-800-853-5163 or 336-641-4981   °Substance Abuse Resources °Organization         Address  Phone  Notes  °[facility]  336-882-2125   °[facility]  336-784-9470   °[facility]  336-285-9073   °[facility]  336-845-3988   °Residential & Outpatient Substance Abuse Program  1-800-659-3381   °Psychological Services °Organization         Address  Phone  Notes  °[facility]  336- 832-9600   °[facility]  336- 378-7881   °[facility] [address], [place] 1-800-853-5163 or 336-641-4981   ° °Mobile Crisis Teams °Organization         Address  Phone  Notes  °[facility], Mobile Crisis Care Unit  1-877-626-1772   °Assertive °[facility] ° [address]  [place], [place] 336-834-9664   °Sharon DeEsch [address] °[place] 336-554-5454   ° °Self-Help/Support Groups °Organization         Address  Phone             Notes  °[facility] - variety of support groups  336- 373-1402 Call for more information  °Narcotics Anonymous (NA), [facility] [address], °[place]   2 meetings at this location  ° °  Residential Treatment Programs °Organization         Address  Phone  Notes  °[facility] [address],    °[place]  1-866-801-8205   °[facility] ° [address], [place], [place] 704-293-8524   °[facility] [address], [place] 336-845-3988 Admissions: 8am-3pm M-F  °[facility] [address]    °[place], [place] 336-841-1104   °[facility] [address], [place], [place] 336-379-7146   °[facility] [address]  °[place], [place] 336-285-9073   °[facility] - Intensive Outpatient [address], [place], [place] 336-852-3033   °[facility] [address]  °[place], [place] 1-877-615-2722 or 336-784-9470   °[facility] ([facility]) [address] [place], [place] 336-227-7417 Accepts Medicaid  °[facility] [address]  °[place] 1-800-659-3381 Substance Abuse/Addiction Treatment  ° °[place] Behavioral Health Resources °Organization         Address  Phone  Notes  °[facility]  (888) 581-9988   °Julie Brannon, PhD [address] [place], [place]   (336) 349-5553 or (336) 951-0000   °[facility]   [address] °[place], [place] (336) 349-4454   °[facility] [address], [place], [place] (336) 342-8316 Insurance/Medicaid/sponsorship through [facility]  °[facility] [address]                                    [place], [place] (336) 342-8316 Therapy/tele-psych/case    °[facility] [address]  ° [place], [place] (336) 349-2233    °Dr. Arfeen  (336) 349-4544   °[facility] [place]  [employer] [place] [facility] 1) [address], [place] °2) [address], [place] °3)  [address], [place] (336) 349-3220 °(336) 342-7768 ° °(336) 342-8140   °[place] [facility] (336) 342-1394 or (336) 342-3537 (After Hours)    ° ° °

## 2014-10-07 NOTE — ED Provider Notes (Signed)
  Physical Exam  BP 152/98 mmHg  Pulse 97  Temp(Src) 98.3 F (36.8 C) (Oral)  Resp 18  SpO2 100%  Physical Exam  Constitutional: No distress.  Eyes: Pupils are equal, round, and reactive to light.  Cardiovascular: Normal rate and regular rhythm.   Pulmonary/Chest: No respiratory distress.  Neurological: He displays no tremor. Gait normal. GCS eye subscore is 4. GCS verbal subscore is 5. GCS motor subscore is 6.  Skin: No rash noted. He is not diaphoretic.  Psychiatric: He does not express inappropriate judgment. He expresses no homicidal and no suicidal ideation. He expresses no suicidal plans and no homicidal plans.    ED Course  Procedures  MDM 87 her old male with a history of alcohol dependence who had presented with concern of initially feeling ill and depressed after going on "a bender" with initial plan for alcohol rehabilitation as an inpatient.  During my care, patient reports he has been thinking today and feels he prefers outpatient management of his alcohol dependence.  Patient reports that he has worked hard to keep his job, and is concerned about missing work, with his wife also relying on him. He reports he still would like to abstain from alcohol and reports plans to follow up with his with his primary care physician and with resources.  He denies any suicidal ideation or homicidal ideation. He was here under voluntary commitment, and is appropriate and feel that outpatient management is reasonable.  He understands the risks of alcohol withdrawal.  He was given a Librium taper, and recommended to follow-up very closely with outpatient treatment programs as well as his primary care doctor. Patient discharged in stable condition with understanding of reasons to return.   Gareth Morgan, MD 10/08/14 [phone]

## 2014-10-07 NOTE — ED Notes (Signed)
Per Andreas Blower, [facility], [place], NP, [facility], advised pt meets inpt criteria - seeking placement.

## 2014-10-07 NOTE — ED Notes (Signed)
Pt's sister called - pt on phone w/her - [phone]

## 2014-10-07 NOTE — ED Notes (Signed)
Per Cyril Mourning, [facility], [facility] requesting 2nd ETOH level - ordered.

## 2014-10-14 ENCOUNTER — Emergency Department (HOSPITAL_COMMUNITY)
Admission: EM | Admit: 2014-10-14 | Discharge: 2014-10-14 | Disposition: A | Discharge status: Home / Self Care | Payer: Medicaid Other | Attending: Emergency Medicine | Admitting: Emergency Medicine

## 2014-10-14 ENCOUNTER — Encounter (HOSPITAL_COMMUNITY): Payer: Self-pay | Admitting: *Deleted

## 2014-10-14 DIAGNOSIS — F1010 Alcohol abuse, uncomplicated: Secondary | ICD-10-CM | POA: Diagnosis present

## 2014-10-14 DIAGNOSIS — J449 Chronic obstructive pulmonary disease, unspecified: Secondary | ICD-10-CM | POA: Insufficient documentation

## 2014-10-14 DIAGNOSIS — R000 Tachycardia, unspecified: Secondary | ICD-10-CM | POA: Insufficient documentation

## 2014-10-14 DIAGNOSIS — Z720 Tobacco use: Secondary | ICD-10-CM | POA: Diagnosis not present

## 2014-10-14 DIAGNOSIS — F419 Anxiety disorder, unspecified: Secondary | ICD-10-CM | POA: Diagnosis not present

## 2014-10-14 DIAGNOSIS — Z8701 Personal history of pneumonia (recurrent): Secondary | ICD-10-CM | POA: Diagnosis not present

## 2014-10-14 DIAGNOSIS — Z79899 Other long term (current) drug therapy: Secondary | ICD-10-CM | POA: Diagnosis not present

## 2014-10-14 DIAGNOSIS — F1310 Sedative, hypnotic or anxiolytic abuse, uncomplicated: Secondary | ICD-10-CM | POA: Insufficient documentation

## 2014-10-14 DIAGNOSIS — F329 Major depressive disorder, single episode, unspecified: Secondary | ICD-10-CM | POA: Diagnosis not present

## 2014-10-14 LAB — CBC WITH DIFFERENTIAL/PLATELET
Basophils Absolute: 0 K/uL (ref 0.0–0.1)
Basophils Relative: 0 %
Eosinophils Absolute: 0.1 K/uL (ref 0.0–0.7)
Eosinophils Relative: 2 %
HCT: 36.4 % — ABNORMAL LOW (ref 39.0–52.0)
Hemoglobin: 12.5 g/dL — ABNORMAL LOW (ref 13.0–17.0)
Lymphocytes Relative: 32 %
Lymphs Abs: 2.2 K/uL (ref 0.7–4.0)
MCH: 34.2 pg — ABNORMAL HIGH (ref 26.0–34.0)
MCHC: 34.3 g/dL (ref 30.0–36.0)
MCV: 99.7 fL (ref 78.0–100.0)
Monocytes Absolute: 0.9 K/uL (ref 0.1–1.0)
Monocytes Relative: 14 %
Neutro Abs: 3.5 K/uL (ref 1.7–7.7)
Neutrophils Relative %: 52 %
Platelets: 275 K/uL (ref 150–400)
RBC: 3.65 MIL/uL — ABNORMAL LOW (ref 4.22–5.81)
RDW: 13.1 % (ref 11.5–15.5)
WBC: 6.7 K/uL (ref 4.0–10.5)

## 2014-10-14 LAB — COMPREHENSIVE METABOLIC PANEL WITH GFR
ALT: 25 U/L (ref 17–63)
AST: 42 U/L — ABNORMAL HIGH (ref 15–41)
Albumin: 3.3 g/dL — ABNORMAL LOW (ref 3.5–5.0)
Alkaline Phosphatase: 67 U/L (ref 38–126)
Anion gap: 9 (ref 5–15)
BUN: 7 mg/dL (ref 6–20)
CO2: 26 mmol/L (ref 22–32)
Calcium: 9 mg/dL (ref 8.9–10.3)
Chloride: 105 mmol/L (ref 101–111)
Creatinine, Ser: 0.9 mg/dL (ref 0.61–1.24)
GFR calc Af Amer: >60 mL/min
GFR calc non Af Amer: >60 mL/min
Glucose, Bld: 125 mg/dL — ABNORMAL HIGH (ref 65–99)
Potassium: 3.4 mmol/L — ABNORMAL LOW (ref 3.5–5.1)
Sodium: 140 mmol/L (ref 135–145)
Total Bilirubin: 0.5 mg/dL (ref 0.3–1.2)
Total Protein: 6.2 g/dL — ABNORMAL LOW (ref 6.5–8.1)

## 2014-10-14 LAB — RAPID URINE DRUG SCREEN, HOSP PERFORMED
Amphetamines: NOT DETECTED
Barbiturates: NOT DETECTED
Benzodiazepines: POSITIVE — AB
Cocaine: NOT DETECTED
Opiates: NOT DETECTED
Tetrahydrocannabinol: NOT DETECTED

## 2014-10-14 LAB — ETHANOL: Alcohol, Ethyl (B): <5 mg/dL

## 2014-10-14 MED ORDER — ONDANSETRON 4 MG PO TBDP
4.0000 mg | ORAL_TABLET | Freq: Once | ORAL | Status: AC
  Administered 2014-10-14: 4 mg via ORAL
  Filled 2014-10-14: qty 1

## 2014-10-14 MED ORDER — LORAZEPAM 2 MG/ML IJ SOLN
1.0000 mg | Freq: Once | INTRAMUSCULAR | Status: AC
  Administered 2014-10-14: 1 mg via INTRAVENOUS
  Filled 2014-10-14: qty 1

## 2014-10-14 MED ORDER — SODIUM CHLORIDE 0.9 % IV SOLN: INTRAVENOUS | Status: DC

## 2014-10-14 MED ORDER — LORAZEPAM 1 MG PO TABS
1.0000 mg | ORAL_TABLET | Freq: Once | ORAL | Status: DC
  Filled 2014-10-14: qty 1

## 2014-10-14 MED ORDER — SODIUM CHLORIDE 0.9 % IV BOLUS (SEPSIS)
1000.0000 mL | Freq: Once | INTRAVENOUS | Status: AC
  Administered 2014-10-14: 1000 mL via INTRAVENOUS

## 2014-10-14 MED ORDER — NICOTINE 14 MG/24HR TD PT24
14.0000 mg | MEDICATED_PATCH | Freq: Once | TRANSDERMAL | Status: DC
  Administered 2014-10-14: 14 mg via TRANSDERMAL
  Filled 2014-10-14: qty 1

## 2014-10-14 NOTE — Discharge Instructions (Signed)
Alcohol Intoxication Alcohol intoxication occurs when you drink enough alcohol that it affects your ability to function. It can be mild or very severe. Drinking a lot of alcohol in a short time is called binge drinking. This can be very harmful. Drinking alcohol can also be more dangerous if you are taking medicines or other drugs. Some of the effects caused by alcohol may include:  Loss of coordination.  Changes in mood and behavior.  Unclear thinking.  Trouble talking (slurred speech).  Throwing up (vomiting).  Confusion.  Slowed breathing.  Twitching and shaking (seizures).  Loss of consciousness. HOME CARE  Do not drive after drinking alcohol.  Drink enough water and fluids to keep your pee (urine) clear or pale yellow. Avoid caffeine.  Only take medicine as told by your doctor. GET HELP IF:  You throw up (vomit) many times.  You do not feel better after a few days.  You frequently have alcohol intoxication. Your doctor can help decide if you should see a substance use treatment counselor. GET HELP RIGHT AWAY IF:  You become shaky when you stop drinking.  You have twitching and shaking.  You throw up blood. It may look bright red or like coffee grounds.  You notice blood in your poop (bowel movements).  You become lightheaded or pass out (faint). MAKE SURE YOU:   Understand these instructions.  Will watch your condition.  Will get help right away if you are not doing well or get worse. Document Released: 07/01/2007 Document Revised: 09/14/2012 Document Reviewed: 06/17/2012 [facility] Patient Information 2015 [place], [place]. This information is not intended to replace advice given to you by your health care provider. Make sure you discuss any questions you have with your health care provider.  Discharge to [facility].

## 2014-10-14 NOTE — ED Provider Notes (Signed)
CSN: [id number]     Arrival date & time 10/14/14  1104 History   First MD Initiated Contact with Patient 10/14/14 1252     Chief Complaint  Patient presents with  . Drug / Alcohol Assessment     (Consider location/radiation/quality/duration/timing/severity/associated sxs/prior Treatment) Patient is a 44 y.o. male presenting with drug/alcohol assessment. The history is provided by the patient.  Drug / Alcohol Assessment Associated symptoms: no abdominal pain, no confusion, no headaches, no shortness of breath and no suicidal ideation    patient 44 year old male here for alcohol detox. Patient was evaluated on September 11 for same offered inpatient treatment but turned it down at that time. Patient now once inpatient treatment. Patient's last drink was prior to midnight. Patient states he is drinking about a plane a day. Has been doing this for years has tried to stop in the past but has not been successful.  Past Medical History  Diagnosis Date  . Alcohol abuse   . Depression   . Anxiety   . Pneumonia   . COPD with emphysema   . COPD with asthma   . Delirium tremens    Past Surgical History  Procedure Laterality Date  . Mouth surgery      teeth removed.  . Tracheostomy  01/30/14    feinstein  . Esophagogastroduodenoscopy N/A 02/03/2014    Procedure: ESOPHAGOGASTRODUODENOSCOPY (EGD);  Surgeon: Ladene Artist, MD;  Location: [facility] END[facility]COPY;  Service: Endoscopy;  Laterality: N/A;  bedside/trach/vent  . Peg placement N/A 02/03/2014    Procedure: PERCUTANEOUS ENDOSCOPIC GASTROSTOMY (PEG) PLACEMENT;  Surgeon: Ladene Artist, MD;  Location: [facility]COPY;  Service: Endoscopy;  Laterality: N/A;   Family History  Problem Relation Age of Onset  . Cancer Mother     lung  . Cancer Father     kidney   Social History  Substance Use Topics  . Smoking status: Current Every Day Smoker -- 1.50 packs/day for 20 years    Types: Cigarettes    Last Attempt to Quit: 01/15/2014  . Smokeless tobacco:  Never Used  . Alcohol Use: 0.0 oz/week    0 Standard drinks or equivalent per week     Comment: pint- fifth of vodka a day    Review of Systems  Constitutional: Negative for fever.  HENT: Negative for congestion.   Eyes: Negative for visual disturbance.  Respiratory: Negative for shortness of breath.   Cardiovascular: Negative for chest pain.  Gastrointestinal: Negative for abdominal pain.  Genitourinary: Negative for dysuria.  Musculoskeletal: Negative for back pain.  Skin: Negative for rash.  Neurological: Negative for tremors and headaches.  Hematological: Does not bruise/bleed easily.  Psychiatric/Behavioral: Negative for suicidal ideas and confusion.      Allergies  Review of patient's allergies indicates no known allergies.  Home Medications   Prior to Admission medications   Medication Sig Start Date End Date Taking? Authorizing Provider  clonazePAM (KLONOPIN) 1 MG tablet Take 1 tablet (1 mg total) by mouth at bedtime. Patient taking differently: Take 1 mg by mouth 2 (two) times daily.  02/22/14  Yes Grace Bushy Minor, NP  chlordiazePOXIDE (LIBRIUM) 25 MG capsule 50mg  PO TID x 1D, then 25-50mg  PO BID X 1D, then 25-50mg  PO QD X 1D Patient not taking: Reported on 10/14/2014 10/07/14   Gareth Morgan, MD  traZODone (DESYREL) 100 MG tablet Place 1 tablet (100 mg total) into feeding tube at bedtime. Patient not taking: Reported on 10/14/2014 02/22/14   Grace Bushy Minor, NP  BP 122/85 mmHg  Pulse 95  Temp(Src) 97.7 F (36.5 C) (Oral)  Resp 16  SpO2 100% Physical Exam  Constitutional: He is oriented to person, place, and time. He appears well-developed and well-nourished.  HENT:  Head: Normocephalic and atraumatic.  Mouth/Throat: Oropharynx is clear and moist.  Eyes: Conjunctivae and EOM are normal. Pupils are equal, round, and reactive to light.  Neck: Normal range of motion.  Cardiovascular: Regular rhythm and normal heart sounds.   No murmur heard. Tachycardic   Pulmonary/Chest: Effort normal and breath sounds normal. No respiratory distress.  Abdominal: Soft. Bowel sounds are normal. There is no tenderness.  Musculoskeletal: Normal range of motion. He exhibits no edema or tenderness.  Neurological: He is alert and oriented to person, place, and time. No cranial nerve deficit. He exhibits normal muscle tone. Coordination normal.  Skin: Skin is warm.  Nursing note and vitals reviewed.   ED Course  Procedures (including critical care time) Labs Review Labs Reviewed  COMPREHENSIVE METABOLIC PANEL - Abnormal; Notable for the following:    Potassium 3.4 (*)    Glucose, Bld 125 (*)    Total Protein 6.2 (*)    Albumin 3.3 (*)    AST 42 (*)    All other components within normal limits  CBC WITH DIFFERENTIAL/PLATELET - Abnormal; Notable for the following:    RBC 3.65 (*)    Hemoglobin 12.5 (*)    HCT 36.4 (*)    MCH 34.2 (*)    All other components within normal limits  ETHANOL  URINE RAPID DRUG SCREEN, HOSP PERFORMED   Results for orders placed or performed during the hospital encounter of 10/14/14  Ethanol  Result Value Ref Range   Alcohol, Ethyl (B) <5 <5 mg/dL  Comprehensive metabolic panel  Result Value Ref Range   Sodium 140 135 - 145 mmol/L   Potassium 3.4 (L) 3.5 - 5.1 mmol/L   Chloride 105 101 - 111 mmol/L   CO2 26 22 - 32 mmol/L   Glucose, Bld 125 (H) 65 - 99 mg/dL   BUN 7 6 - 20 mg/dL   Creatinine, Ser 0.90 0.61 - 1.24 mg/dL   Calcium 9.0 8.9 - 10.3 mg/dL   Total Protein 6.2 (L) 6.5 - 8.1 g/dL   Albumin 3.3 (L) 3.5 - 5.0 g/dL   AST 42 (H) 15 - 41 U/L   ALT 25 17 - 63 U/L   Alkaline Phosphatase 67 38 - 126 U/L   Total Bilirubin 0.5 0.3 - 1.2 mg/dL   GFR calc non Af Amer >60 >60 mL/min   GFR calc Af Amer >60 >60 mL/min   Anion gap 9 5 - 15  CBC with Differential/Platelet  Result Value Ref Range   WBC 6.7 4.0 - 10.5 K/uL   RBC 3.65 (L) 4.22 - 5.81 MIL/uL   Hemoglobin 12.5 (L) 13.0 - 17.0 g/dL   HCT 36.4 (L) 39.0 -  52.0 %   MCV 99.7 78.0 - 100.0 fL   MCH 34.2 (H) 26.0 - 34.0 pg   MCHC 34.3 30.0 - 36.0 g/dL   RDW 13.1 11.5 - 15.5 %   Platelets 275 150 - 400 K/uL   Neutrophils Relative % 52 %   Neutro Abs 3.5 1.7 - 7.7 K/uL   Lymphocytes Relative 32 %   Lymphs Abs 2.2 0.7 - 4.0 K/uL   Monocytes Relative 14 %   Monocytes Absolute 0.9 0.1 - 1.0 K/uL   Eosinophils Relative 2 %   Eosinophils Absolute  0.1 0.0 - 0.7 K/uL   Basophils Relative 0 %   Basophils Absolute 0.0 0.0 - 0.1 K/uL     Imaging Review No results found. I have personally reviewed and evaluated these images and lab results as part of my medical decision-making.   EKG Interpretation None      MDM   Final diagnoses:  ETOH abuse    Patient with long-standing significant alcohol abuse problem. Patient had last drink was prior to midnight. States that he's drinking about a fifth a day. Patient was seen here September 11 was offered inpatient detox program and refused not to take it. Patient now wants to get inpatient detox. Patient was showing some signs and evidence of some alcohol withdrawal was    tachycardic. The patient was given 1 mg of IV Ativan and some fluids heart rate came down in the 90s patient was feeling much better. Nicotine patch applied. Patient has a long-standing history of tobacco abuse. We'll re-dose with the Ativan. Patient's wife stated that [facility] had some place for him if he was medically cleared. Old labs sent to [facility]. Patient is medically cleared to go to [facility].   Fredia Sorrow, MD 10/14/14 1555

## 2014-10-14 NOTE — ED Notes (Signed)
Pt made aware of needed urine specimen  

## 2014-10-14 NOTE — ED Notes (Signed)
Pt requesting detox from [facility] today, reports drinking a pint or a fifth a day.  Reports last drink was last night.  Pt reports mild shakes at this time and nausea.

## 2015-06-11 ENCOUNTER — Emergency Department (HOSPITAL_COMMUNITY): Payer: Medicaid Other

## 2015-06-11 ENCOUNTER — Emergency Department (HOSPITAL_COMMUNITY)
Admission: EM | Admit: 2015-06-11 | Discharge: 2015-06-11 | Discharge status: Against Medical Advice | Payer: Medicaid Other | Attending: Emergency Medicine | Admitting: Emergency Medicine

## 2015-06-11 ENCOUNTER — Encounter (HOSPITAL_COMMUNITY): Payer: Self-pay

## 2015-06-11 DIAGNOSIS — Y929 Unspecified place or not applicable: Secondary | ICD-10-CM | POA: Diagnosis not present

## 2015-06-11 DIAGNOSIS — F17210 Nicotine dependence, cigarettes, uncomplicated: Secondary | ICD-10-CM | POA: Insufficient documentation

## 2015-06-11 DIAGNOSIS — F329 Major depressive disorder, single episode, unspecified: Secondary | ICD-10-CM | POA: Diagnosis not present

## 2015-06-11 DIAGNOSIS — S20212A Contusion of left front wall of thorax, initial encounter: Secondary | ICD-10-CM

## 2015-06-11 DIAGNOSIS — J189 Pneumonia, unspecified organism: Secondary | ICD-10-CM | POA: Insufficient documentation

## 2015-06-11 DIAGNOSIS — Z79899 Other long term (current) drug therapy: Secondary | ICD-10-CM | POA: Diagnosis not present

## 2015-06-11 DIAGNOSIS — A419 Sepsis, unspecified organism: Secondary | ICD-10-CM | POA: Diagnosis not present

## 2015-06-11 DIAGNOSIS — J449 Chronic obstructive pulmonary disease, unspecified: Secondary | ICD-10-CM | POA: Diagnosis not present

## 2015-06-11 DIAGNOSIS — Y939 Activity, unspecified: Secondary | ICD-10-CM | POA: Diagnosis not present

## 2015-06-11 DIAGNOSIS — Y999 Unspecified external cause status: Secondary | ICD-10-CM | POA: Insufficient documentation

## 2015-06-11 DIAGNOSIS — W1830XA Fall on same level, unspecified, initial encounter: Secondary | ICD-10-CM | POA: Diagnosis not present

## 2015-06-11 DIAGNOSIS — S299XXA Unspecified injury of thorax, initial encounter: Secondary | ICD-10-CM | POA: Diagnosis present

## 2015-06-11 LAB — BASIC METABOLIC PANEL
Anion gap: 13 (ref 5–15)
BUN: 22 mg/dL — AB (ref 6–20)
CALCIUM: 8.3 mg/dL — AB (ref 8.9–10.3)
CHLORIDE: 106 mmol/L (ref 101–111)
CO2: 18 mmol/L — ABNORMAL LOW (ref 22–32)
CREATININE: 1.3 mg/dL — AB (ref 0.61–1.24)
Glucose, Bld: 103 mg/dL — ABNORMAL HIGH (ref 65–99)
Potassium: 3.6 mmol/L (ref 3.5–5.1)
SODIUM: 137 mmol/L (ref 135–145)

## 2015-06-11 LAB — CBC WITH DIFFERENTIAL/PLATELET
BASOS ABS: 0 10*3/uL (ref 0.0–0.1)
BASOS PCT: 0 %
Eosinophils Absolute: 0 10*3/uL (ref 0.0–0.7)
Eosinophils Relative: 0 %
HCT: 41.5 % (ref 39.0–52.0)
HEMOGLOBIN: 14.2 g/dL (ref 13.0–17.0)
LYMPHS PCT: 6 %
Lymphs Abs: 1.7 10*3/uL (ref 0.7–4.0)
MCH: 31.1 pg (ref 26.0–34.0)
MCHC: 34.2 g/dL (ref 30.0–36.0)
MCV: 91 fL (ref 78.0–100.0)
MONOS PCT: 10 %
Monocytes Absolute: 2.8 10*3/uL — ABNORMAL HIGH (ref 0.1–1.0)
NEUTROS ABS: 23.1 10*3/uL — AB (ref 1.7–7.7)
Neutrophils Relative %: 84 %
PLATELETS: 265 10*3/uL (ref 150–400)
RBC: 4.56 MIL/uL (ref 4.22–5.81)
RDW: 14.4 % (ref 11.5–15.5)
WBC: 27.6 10*3/uL — ABNORMAL HIGH (ref 4.0–10.5)

## 2015-06-11 LAB — URINALYSIS, ROUTINE W REFLEX MICROSCOPIC
GLUCOSE, UA: NEGATIVE mg/dL
KETONES UR: NEGATIVE mg/dL
Nitrite: NEGATIVE
PH: 5.5 (ref 5.0–8.0)
PROTEIN: 100 mg/dL — AB
Specific Gravity, Urine: >1.046 — ABNORMAL HIGH (ref 1.005–1.030)

## 2015-06-11 LAB — URINE MICROSCOPIC-ADD ON

## 2015-06-11 LAB — I-STAT CG4 LACTIC ACID, ED: LACTIC ACID, VENOUS: 3.81 mmol/L — AB (ref 0.5–2.0)

## 2015-06-11 MED ORDER — DEXTROSE 5 % IV SOLN: 500.0000 mg | Freq: Once | INTRAVENOUS | Status: DC

## 2015-06-11 MED ORDER — ONDANSETRON HCL 4 MG/2ML IJ SOLN
4.0000 mg | Freq: Once | INTRAMUSCULAR | Status: AC
  Administered 2015-06-11: 4 mg via INTRAVENOUS
  Filled 2015-06-11: qty 2

## 2015-06-11 MED ORDER — AZITHROMYCIN 250 MG PO TABS
500.0000 mg | ORAL_TABLET | Freq: Once | ORAL | Status: AC
  Administered 2015-06-11: 500 mg via ORAL
  Filled 2015-06-11: qty 2

## 2015-06-11 MED ORDER — AZITHROMYCIN 250 MG PO TABS: 250.0000 mg | ORAL_TABLET | Freq: Every day | ORAL | Status: DC

## 2015-06-11 MED ORDER — SODIUM CHLORIDE 0.9 % IV BOLUS (SEPSIS)
1000.0000 mL | Freq: Once | INTRAVENOUS | Status: AC
  Administered 2015-06-11: 1000 mL via INTRAVENOUS

## 2015-06-11 MED ORDER — HYDROMORPHONE HCL 1 MG/ML IJ SOLN
1.0000 mg | Freq: Once | INTRAMUSCULAR | Status: AC
  Administered 2015-06-11: 1 mg via INTRAVENOUS
  Filled 2015-06-11: qty 1

## 2015-06-11 MED ORDER — IOPAMIDOL (ISOVUE-370) INJECTION 76%
100.0000 mL | Freq: Once | INTRAVENOUS | Status: AC | PRN
  Administered 2015-06-11: 100 mL via INTRAVENOUS

## 2015-06-11 MED ORDER — ALBUTEROL SULFATE HFA 108 (90 BASE) MCG/ACT IN AERS
2.0000 | INHALATION_SPRAY | RESPIRATORY_TRACT | Status: DC | PRN
  Filled 2015-06-11: qty 6.7

## 2015-06-11 MED ORDER — SODIUM CHLORIDE 0.9 % IV BOLUS (SEPSIS)
1000.0000 mL | Freq: Once | INTRAVENOUS | Status: AC
Start: 2015-06-11 — End: 2015-06-11
  Administered 2015-06-11: 1000 mL via INTRAVENOUS

## 2015-06-11 MED ORDER — OXYCODONE-ACETAMINOPHEN 5-325 MG PO TABS: 1.0000 | ORAL_TABLET | Freq: Four times a day (QID) | ORAL | Status: DC | PRN

## 2015-06-11 MED ORDER — DEXTROSE 5 % IV SOLN
1.0000 g | Freq: Once | INTRAVENOUS | Status: AC
  Administered 2015-06-11: 1 g via INTRAVENOUS
  Filled 2015-06-11: qty 10

## 2015-06-11 MED ORDER — SODIUM CHLORIDE 0.9 % IV BOLUS (SEPSIS)
500.0000 mL | Freq: Once | INTRAVENOUS | Status: AC
  Administered 2015-06-11: 500 mL via INTRAVENOUS

## 2015-06-11 NOTE — ED Notes (Signed)
Pt ambulated without difficulty

## 2015-06-11 NOTE — ED Notes (Signed)
PA at bedside.

## 2015-06-11 NOTE — ED Notes (Signed)
Patient presents to ED via EMS with complaints of pain to L ribcage 6/10. Patient reports falling on Sunday (5/14) and landing on a rock or a root. Patient reports " the ground gave out" and he fell. He denies feeling dizziness or weakness prior to falling.  Patient reports feeling rib bone move upon ambulation. Patient report shortness of breath and a productive cough with thick green sputum for the last two days. Expiratory wheezing auscultated in all lobes. Patient reports a history of pneumonia and left chest tube with hospitalization for 6-7 weeks in December 2015-January 2016.

## 2015-06-11 NOTE — ED Provider Notes (Signed)
CSN: [id number]     Arrival date & time 06/11/15  1708 History   First MD Initiated Contact with Patient 06/11/15 1714     Chief Complaint  Patient presents with  . Fall  . Rib Fracture  . Cough     (Consider location/radiation/quality/duration/timing/severity/associated sxs/prior Treatment) HPI Comments: Patient with history of alcohol abuse, pneumonia, reports a fall occurring 3 days ago where he landed on his left rib cage. Denies head of neck injury. Patient has had worsening pain of left lower chest wall, shortness of breath, cough since that time. No fevers. Patient is a smoker and has wheezing. Patient states no history of COPD but records indicate that he has COPD. Reports no hemoptysis but cough is productive of thick green sputum. Patient had a complicated pneumonia in December 2015. Wife is worried that 'something isn't right' and that he is developing another pneumonia. No treatments prior to arrival other than robitussin. Patient denies abdominal pain, diarrhea, vomiting, blood in stool or urine, bruising on chest or abdomen. Onset of symptoms acute. Course is constant. Deep breathing and palpation makes the pain worse.  The history is provided by the patient and medical records.    Past Medical History  Diagnosis Date  . Alcohol abuse   . Depression   . Anxiety   . Pneumonia   . COPD with emphysema   . COPD with asthma   . Delirium tremens    Past Surgical History  Procedure Laterality Date  . Mouth surgery      teeth removed.  . Tracheostomy  01/30/14    feinstein  . Esophagogastroduodenoscopy N/A 02/03/2014    Procedure: ESOPHAGOGASTRODUODENOSCOPY (EGD);  Surgeon: Ladene Artist, MD;  Location: [facility] ENDOSCOPY;  Service: Endoscopy;  Laterality: N/A;  bedside/trach/vent  . Peg placement N/A 02/03/2014    Procedure: PERCUTANEOUS ENDOSCOPIC GASTROSTOMY (PEG) PLACEMENT;  Surgeon: Ladene Artist, MD;  Location: [facility] ENDOSCOPY;  Service: Endoscopy;  Laterality: N/A;   Family  History  Problem Relation Age of Onset  . Cancer Mother     lung  . Cancer Father     kidney   Social History  Substance Use Topics  . Smoking status: Current Every Day Smoker -- 1.50 packs/day for 20 years    Types: Cigarettes    Last Attempt to Quit: 01/15/2014  . Smokeless tobacco: Never Used  . Alcohol Use: 0.0 oz/week    0 Standard drinks or equivalent per week     Comment: pint- fifth of vodka a day    Review of Systems  Constitutional: Negative for fever.  HENT: Negative for rhinorrhea and sore throat.   Eyes: Negative for redness.  Respiratory: Positive for cough, shortness of breath and wheezing.   Cardiovascular: Positive for chest pain.  Gastrointestinal: Negative for nausea, vomiting, abdominal pain and diarrhea.  Genitourinary: Negative for dysuria.  Musculoskeletal: Negative for myalgias.  Skin: Negative for rash.  Neurological: Negative for headaches.      Allergies  Review of patient's allergies indicates no known allergies.  Home Medications   Prior to Admission medications   Medication Sig Start Date End Date Taking? Authorizing Provider  chlordiazePOXIDE (LIBRIUM) 25 MG capsule 50mg  PO TID x 1D, then 25-50mg  PO BID X 1D, then 25-50mg  PO QD X 1D Patient not taking: Reported on 10/14/2014 10/07/14   Gareth Morgan, MD  clonazePAM (KLONOPIN) 1 MG tablet Take 1 tablet (1 mg total) by mouth at bedtime. Patient taking differently: Take 1 mg by mouth 2 (two)  times daily.  02/22/14   Grace Bushy Minor, NP  traZODone (DESYREL) 100 MG tablet Place 1 tablet (100 mg total) into feeding tube at bedtime. Patient not taking: Reported on 10/14/2014 02/22/14   Grace Bushy Minor, NP   BP 110/77 mmHg  Pulse 120  Temp(Src) 98.4 F (36.9 C) (Oral)  Resp 20  Ht 5\' 11"  (1.803 m)  Wt 73.483 kg  BMI 22.60 kg/m2  SpO2 94%   Physical Exam  Constitutional: He is oriented to person, place, and time. He appears well-developed and well-nourished.  HENT:  Head: Normocephalic  and atraumatic.  Mouth/Throat: Oropharynx is clear and moist.  Eyes: Conjunctivae are normal. Pupils are equal, round, and reactive to light. Right eye exhibits no discharge. Left eye exhibits no discharge.  No conj pallor  Neck: Normal range of motion. Neck supple.  Cardiovascular: Regular rhythm and normal heart sounds.  Tachycardia present.   No murmur heard. Pulmonary/Chest: Effort normal. No respiratory distress. He has wheezes. He has no rales. He exhibits tenderness (left lower ribs).  Hypoxia to 91% on RA. Good air movement bilaterally. No crackles or rhonchi.   Abdominal: Soft. Bowel sounds are normal. There is no tenderness. There is no rebound and no guarding.  No external bruising noted. Tenderness seems confined to inferior rib. No significant LUQ tenderness.   Musculoskeletal: He exhibits no edema or tenderness.  Neurological: He is alert and oriented to person, place, and time.  Skin: Skin is warm and dry.  No sweating or pallor. Brisk cap refill.   Psychiatric: He has a normal mood and affect.  Nursing note and vitals reviewed.   ED Course  Procedures (including critical care time) Labs Review Labs Reviewed  CBC WITH DIFFERENTIAL/PLATELET - Abnormal; Notable for the following:    WBC 27.6 (*)    Neutro Abs 23.1 (*)    Monocytes Absolute 2.8 (*)    All other components within normal limits  BASIC METABOLIC PANEL - Abnormal; Notable for the following:    CO2 18 (*)    Glucose, Bld 103 (*)    BUN 22 (*)    Creatinine, Ser 1.30 (*)    Calcium 8.3 (*)    All other components within normal limits  I-STAT CG4 LACTIC ACID, ED    Imaging Review Dg Ribs Unilateral W/chest Left  06/11/2015  CLINICAL DATA:  45 year old male with fall and left lateral rib pain. Chest radiograph dated 10/06/2014 EXAM: LEFT RIBS AND CHEST - 3+ VIEW COMPARISON:  None. FINDINGS: No fracture or other bone lesions are seen involving the ribs. There is no evidence of pneumothorax or pleural  effusion. Both lungs are clear. A 5 mm nodular density in the right mid lung field superimposed over the right posterior seventh rib appears similar to the chest radiograph dated 03/02/2014. This was not seen on the CT dated 03/26/2014 and likely represents an area of vascular crowding or artifactual. Heart size and mediastinal contours are within normal limits. IMPRESSION: No rib fracture or pneumothorax. Electronically Signed   By: Anner Crete M.D.   On: 06/11/2015 18:22   I have personally reviewed and evaluated these images and lab results as part of my medical decision-making.   5:34 PM Patient seen and examined. Work-up initiated. Medications ordered.   Vital signs reviewed and are as follows: Temp(Src) 98.4 F (36.9 C) (Oral)  SpO2 96%  7:02 PM No anemia, but leukocytosis. Also unexplained hypoxia. Will check CTA to eval lungs, r/o PE.   Discussed case  with Dr. Alvino Chapel and asked him to see patient.   7:30 PM Lactate is elevated. Increased fluids to meet 30cc/kg criteria. Unclear source at this point. Awaiting chest CT.   8:25 PM Discussed CT findings with patient. Discussed that hypoxia, WBC count of 27,600 and lactate of 3.81 are alarming for severe infection and that he would be best served in the hospital. Unfortunately patient is adamant about going home. He does not want to have another hospitalization like he did last year. He seems afraid to stay in the hospital. We discussed that his condition could get worse and he may have respiratory failure or even die if his condition worsens. He does not want to stay in the hospital. Patient's wife was involved in this discussion. She states that if he goes home, she will make sure he rests, takes his medication, and follow-up with Dr. Kenton Kingfisher on Thursday at the latest. He agrees to stay for dose of IV rocephin and PO azithromycin. Will allow patient to leave AMA and give limited pain medication and antibiotics. We discussed that he can  return at any time if he changes his mind.   BP 110/77 mmHg  Pulse 120  Temp(Src) 98.4 F (36.9 C) (Oral)  Resp 20  Ht 5\' 11"  (1.803 m)  Wt 73.483 kg  BMI 22.60 kg/m2  SpO2 94%   MDM   Final diagnoses:  Community acquired pneumonia  Rib contusion, left, initial encounter  Sepsis, due to unspecified organism [facility])   Patient with concerning findings of tachycardia, hypoxia, leukocytosis, atypical pneumonia on chest CT, lactic acidosis. Strongly advised the patient be admitted to the hospital for treatment especially in light of blood work findings. He does not want to be admitted to the hospital. Patient is of sound mind and can make medical decisions regarding his care. He had a bad experience with extended admission last year and is afraid of going through this again. Patient has received fluids and IV antibiotics in the emergency department. Discharged home with azithromycin. Strongly encouraged that patient follow-up with his primary care physician on Thursday. I sent in Epic note to the PCP asking to facilitate follow-up. Return instructions discussed with patient and wife. D/c AMA.   No anemia or signs of shock. Abdomen is soft and non-tender throughout stay. No concern of closed head or C-spine injury.  Carlisle Cater, PA-C 06/11/15 2133  Davonna Belling, MD 06/12/15 1105

## 2015-06-11 NOTE — Discharge Instructions (Signed)
Please read and follow all provided instructions.  Your diagnoses today include:  1. Community acquired pneumonia   2. Rib contusion, left, initial encounter   3. Sepsis, due to unspecified organism [facility])     Tests performed today include:  Blood counts and electrolytes - shows very high blood cell count  Chest x-ray and CT scan - shows pneumonia, no blood clots  Lactic acid - shows your body is under stress from bad infection  Vital signs. See below for your results today.   Medications prescribed:   Azithromycin - antibiotic for respiratory infection  You have been prescribed an antibiotic medicine: take the entire course of medicine even if you are feeling better. Stopping early can cause the antibiotic not to work.   Percocet (oxycodone/acetaminophen) - narcotic pain medication  DO NOT drive or perform any activities that require you to be awake and alert because this medicine can make you drowsy. BE VERY CAREFUL not to take multiple medicines containing Tylenol (also called acetaminophen). Doing so can lead to an overdose which can damage your liver and cause liver failure and possibly death.  Take any prescribed medications only as directed.  Home care instructions:  Follow any educational materials contained in this packet.  Take the complete course of antibiotics that you were prescribed.   Follow-up instructions: We feel that you should stay in the hospital for treatment. You could potentially get worse at home and have respiratory failure and die. At the very minimum, please follow-up with Dr. Kenton Kingfisher on Thursday May 18th for a recheck. Do not work until cleared by your doctor.   Return instructions:   Please return to the Emergency Department if you experience worsening symptoms.   Return immediately with worsening breathing, worsening shortness of breath, or if you feel it is taking you more effort to breathe.   Return if you feel like you change your mind and  want to be admitted.   Please return if you have any other emergent concerns.   Additional Information:  Your vital signs today were: BP 115/68 mmHg   Pulse 105   Temp(Src) 98.3 F (36.8 C) (Oral)   Resp 18   Ht 5\' 11"  (1.803 m)   Wt 73.483 kg   BMI 22.60 kg/m2   SpO2 95% If your blood pressure (BP) was elevated above 135/85 this visit, please have this repeated by your doctor within one month. --------------

## 2015-06-11 NOTE — ED Notes (Signed)
Pt taken to CT. Will introduce myself when he returns.

## 2015-06-11 NOTE — ED Notes (Signed)
Bed: [id number] Expected date:  Expected time:  Means of arrival:  Comments: 24 M, possible rib frx, HR 128, fall mothers day

## 2015-06-16 LAB — CULTURE, BLOOD (ROUTINE X 2)
CULTURE: NO GROWTH
Culture: NO GROWTH

## 2015-07-04 ENCOUNTER — Ambulatory Visit
Admission: RE | Admit: 2015-07-04 | Discharge: 2015-07-04 | Disposition: A | Discharge status: Home / Self Care | Payer: Medicaid Other | Source: Ambulatory Visit | Attending: Physician Assistant | Admitting: Physician Assistant

## 2015-07-04 ENCOUNTER — Other Ambulatory Visit: Payer: Self-pay | Admitting: Physician Assistant

## 2015-07-04 DIAGNOSIS — Z8701 Personal history of pneumonia (recurrent): Secondary | ICD-10-CM

## 2015-08-02 IMAGING — CT CT CHEST W/O CM
2 of 3 series · 15 of 36 positions shown, 18 images · non-contrast
Comparison: 02/11/2013

CLINICAL DATA: Parapneumonic effusion

EXAM:
CT CHEST WITHOUT CONTRAST
TECHNIQUE: Multidetector CT imaging of the chest was performed following the
standard protocol without IV contrast..

[Series 2: thorax 5.0 i31f 1 · axial · 0.88mm/px · z∈[+995,+1290]mm · 12 of 71 slices shown, 15 images]
[im 6/71  mediastinal]
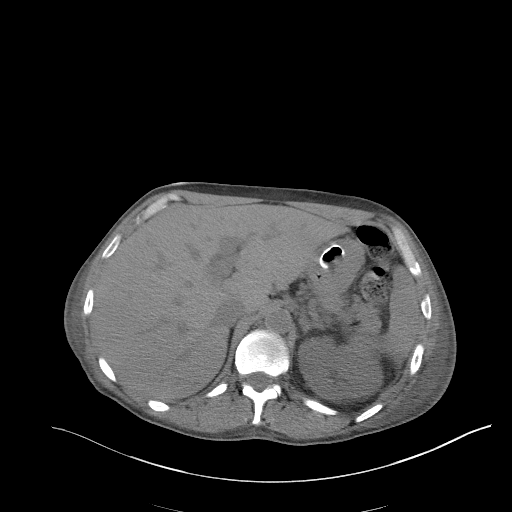
[im 6/71  lung]
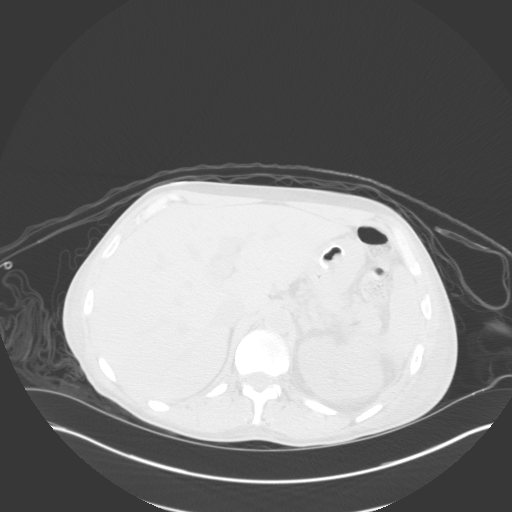
[im 11/71  lung]
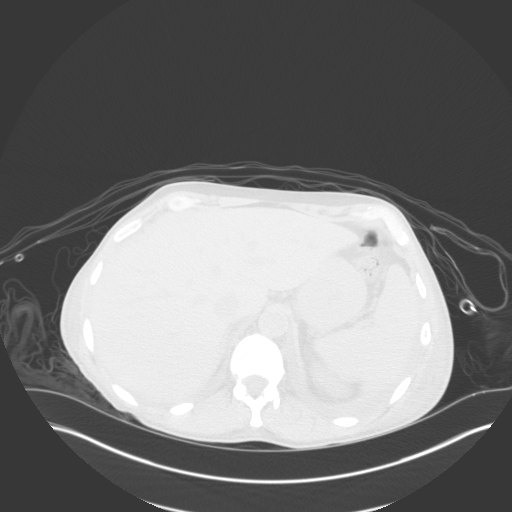
[im 16/71  lung]
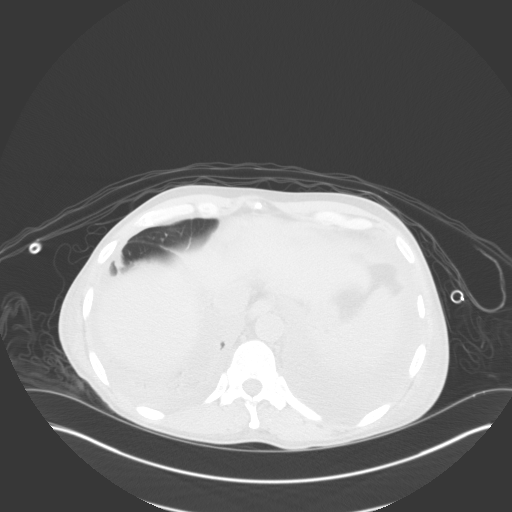
[im 21/71  lung]
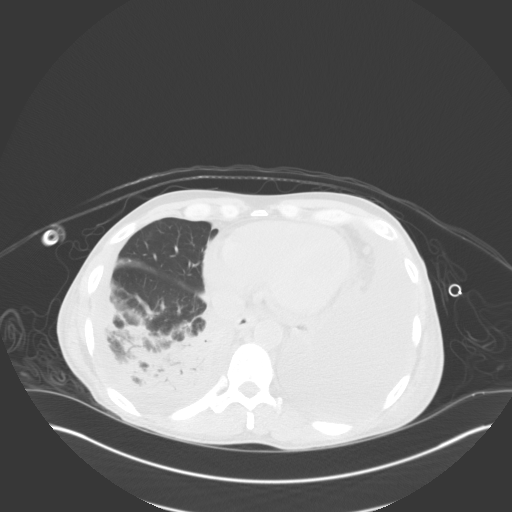
[im 26/71  mediastinal]
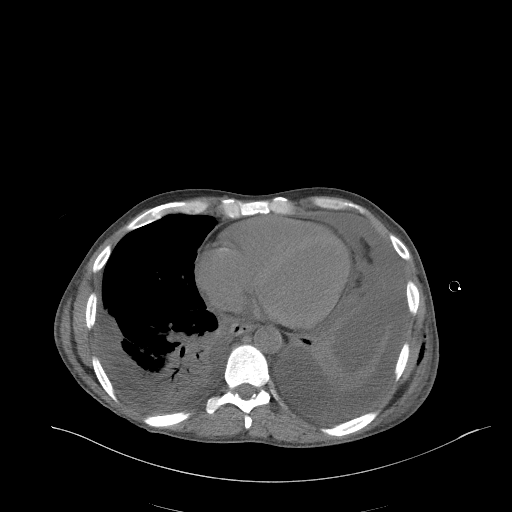
[im 26/71  lung]
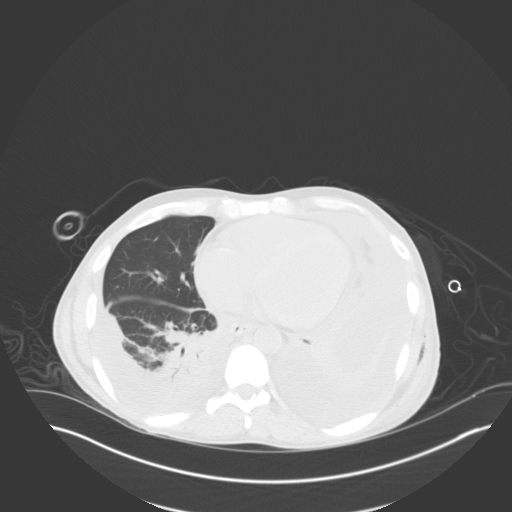
[im 32/71  lung]
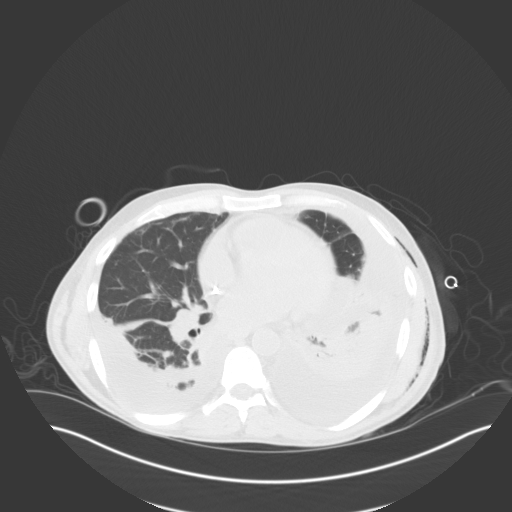
[im 39/71  lung]
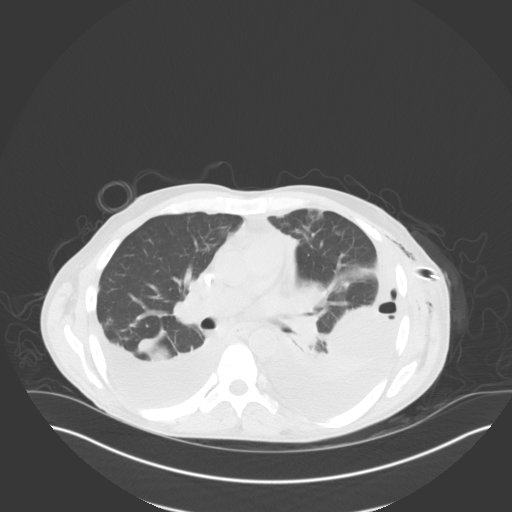
[im 45/71  lung]
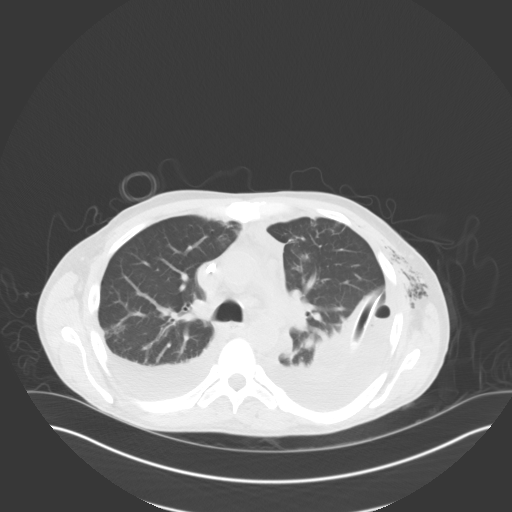
[im 50/71  mediastinal]
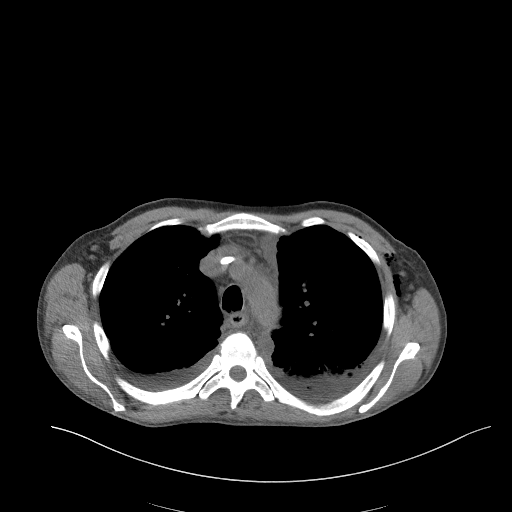
[im 50/71  lung]
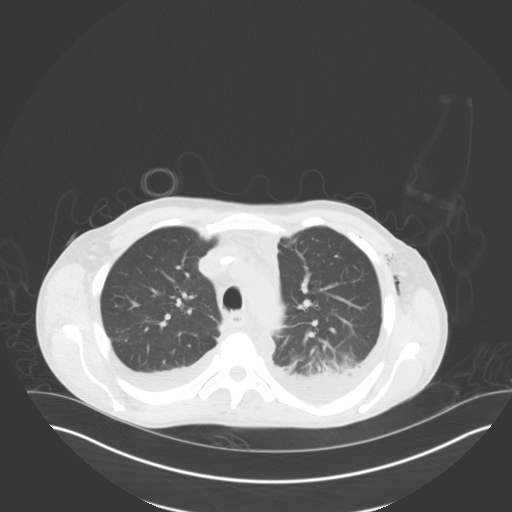
[im 55/71  lung]
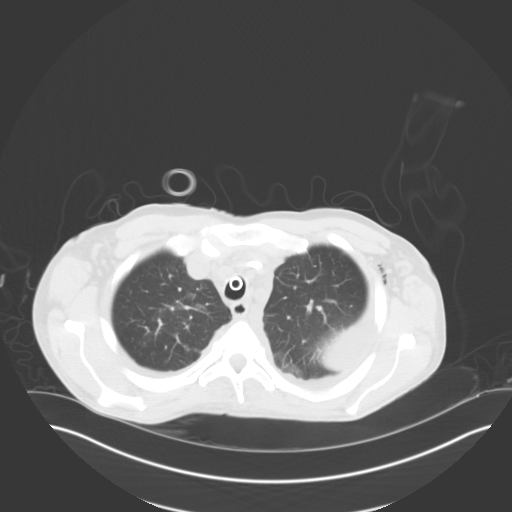
[im 60/71  lung]
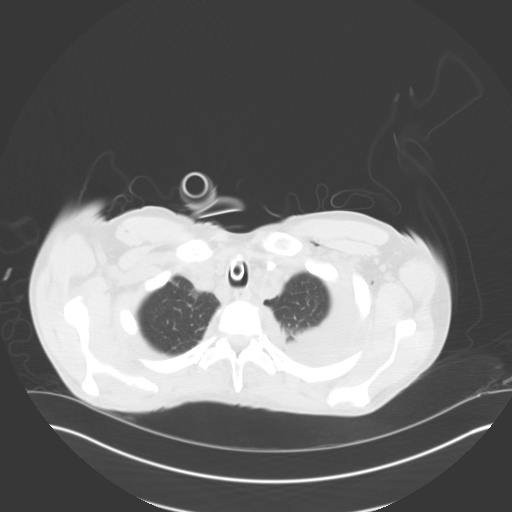
[im 65/71  lung]
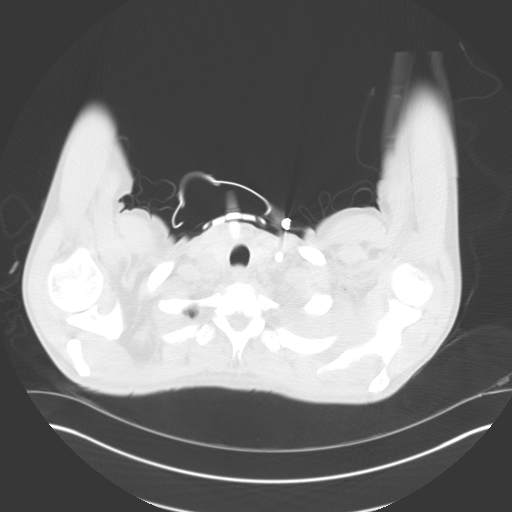

[Series 5: coronal · coronal · 0.69mm/px · 3 of 76 slices shown]
[im 16/76  lung]
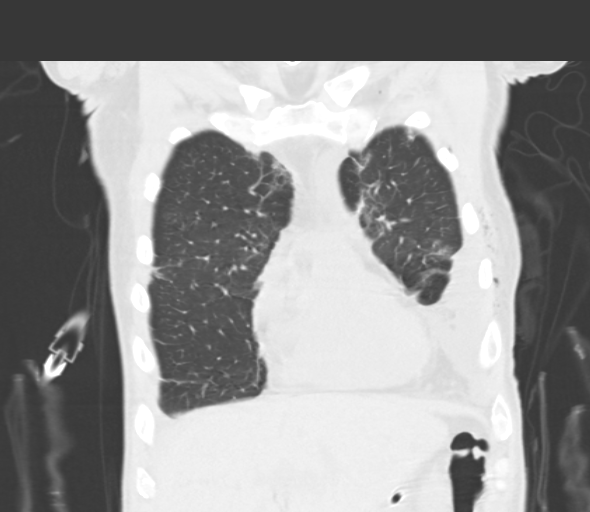
[im 31/76  lung]
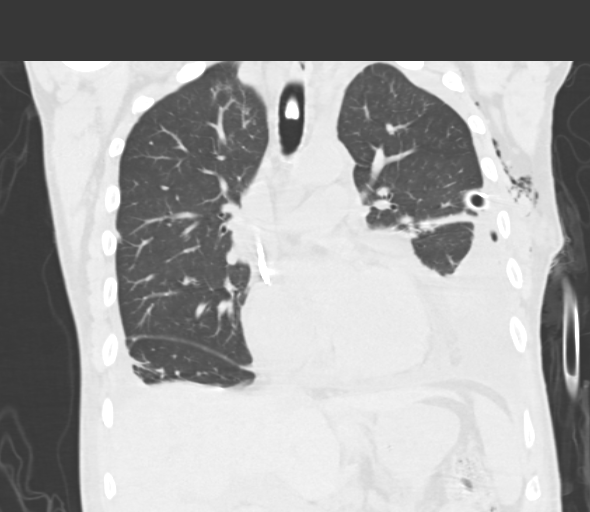
[im 46/76  lung]
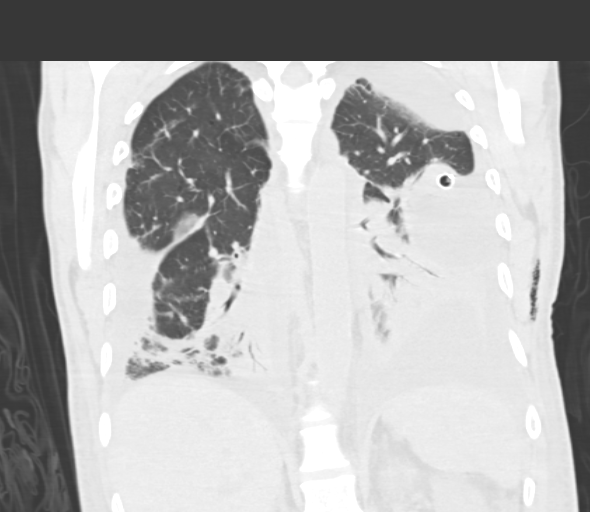

[15 of 36 positions shown; findings below may reference images not displayed]

FINDINGS: THORACIC INLET/BODY WALL:

Tracheostomy tube remains well seated. There is a left IJ catheter
with tip at the upper cavoatrial junction. Visible portions of a
percutaneous gastrostomy tube are in good position.

MEDIASTINUM:

Normal heart size. No cardiomegaly or pericardial effusion. Changes
of anemia. An enlarged upper right peritracheal lymph node is stable
from prior and considered reactive.

LUNG WINDOWS:

Patchy lung opacities are unchanged, best visualized in the upper
lobes. Atelectasis in the left lower lobe is unchanged, with
complete lobar collapse. Airspace disease with air bronchograms in
the right lower lobe is not progressed. Interval decreased in a left
pleural effusion, especially the upper portions. The fluid remains
at least moderate in volume (25-50% of the left pleural space) with
loculation in the lower major fissure and lower lung scalloping. A
small right pleural effusion is unchanged in size and distribution
(mainly layering). Small volume postoperative pneumothorax on the
left. A left-sided chest tube is predominantly enveloped by lung
with only its tip in contact with fluid.

UPPER ABDOMEN:

No acute findings.

OSSEOUS:

No acute fracture.  No suspicious lytic or blastic lesions.
IMPRESSION: 1. Modest decrease in a still extensive and loculated left pleural
effusion. The left-sided chest tube is predominantly in contact with
lung.
2. Multi focal pneumonia, most confluent in the right lower lobe, is
unchanged.
3. Persistent passive collapse of the left lower lobe.

## 2015-09-29 ENCOUNTER — Emergency Department
Admission: EM | Admit: 2015-09-29 | Discharge: 2015-09-29 | Disposition: A | Discharge status: Home / Self Care | Payer: Medicaid Other | Attending: Emergency Medicine | Admitting: Emergency Medicine

## 2015-09-29 ENCOUNTER — Encounter: Payer: Self-pay | Admitting: Emergency Medicine

## 2015-09-29 DIAGNOSIS — Z79899 Other long term (current) drug therapy: Secondary | ICD-10-CM | POA: Diagnosis not present

## 2015-09-29 DIAGNOSIS — F1020 Alcohol dependence, uncomplicated: Secondary | ICD-10-CM | POA: Insufficient documentation

## 2015-09-29 DIAGNOSIS — J449 Chronic obstructive pulmonary disease, unspecified: Secondary | ICD-10-CM | POA: Diagnosis not present

## 2015-09-29 DIAGNOSIS — F17210 Nicotine dependence, cigarettes, uncomplicated: Secondary | ICD-10-CM | POA: Diagnosis not present

## 2015-09-29 DIAGNOSIS — J45909 Unspecified asthma, uncomplicated: Secondary | ICD-10-CM | POA: Insufficient documentation

## 2015-09-29 DIAGNOSIS — Z0000 Encounter for general adult medical examination without abnormal findings: Secondary | ICD-10-CM | POA: Diagnosis present

## 2015-09-29 NOTE — ED Triage Notes (Addendum)
Pt arrived via POV with family. Pt states he has been drinking 1 pint of liquor daily for the past 3 weeks. Pt states his last drink was a 0500 today. Pt has plans to go to [facility] but they require medical clearance. Pt states he doesn't not think he has ever had a seizure before.  Has detoxed himself before and had hallucinations at that time as well.

## 2015-09-29 NOTE — ED Provider Notes (Signed)
[facility] Emergency Department Provider Note  ____________________________________________  Time seen: Approximately 12:29 PM  I have reviewed the triage vital signs and the nursing notes.   HISTORY  Chief Complaint Medical Clearance   HPI Allen Wood is a 45 y.o. male with a history of alcohol abuse, depression, COPD, and active smoker who presents for medical clearance for placement for alcohol detox. Patient reports that he was sober for 8 months but started drinking again 3 weeks ago and he has  been on a binge. He reports that he drinks a pint of liquor daily for the last 3 weeks. His last drink was at 5 AM this morning. Patient went [facility] and was told he had a bed however he needed medical clearance. Patient reports the last time he detoxed he did it on his own and he had hallucinations. He reports that he is afraid of detoxing on his own again. He denies any tremors, headache, anxiety, abdominal pain, nausea, vomiting, chest pain, shortness of breath. He denies any other drugs. He is a smoker. Patient denies suicidal or homicidal ideation. He denies withdrawal seizures.  Past Medical History:  Diagnosis Date  . Alcohol abuse   . Anxiety   . COPD with asthma ([place])   . COPD with emphysema ([place])   . Delirium tremens ([place])   . Depression   . Pneumonia     Patient Active Problem List   Diagnosis Date Noted  . Respiratory failure ([place]) 02/12/2014  . HCAP (healthcare-associated pneumonia)   . Severe sepsis ([place])   . 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 Status post gastrostomy ([place]) 02/08/2014  . Anemia due to chronic blood loss   . Occult blood in stools   . Feeding difficulties   . Dysphagia, oropharyngeal phase   . Alcohol withdrawal delirium ([place])   . Seizure ([place])   . Anemia, chronic disease 01/16/2014  . Solitary pulmonary nodule 01/16/2014  . Pleural effusion 01/16/2014  . Fatigue  01/15/2014  . Nicotine abuse 09/14/2013  . EtOH dependence ([place]) 09/14/2013    Past Surgical History:  Procedure Laterality Date  . ESOPHAGOGASTRODUODENOSCOPY N/A 02/03/2014   Procedure: ESOPHAGOGASTRODUODENOSCOPY (EGD);  Surgeon: Ladene Artist, MD;  Location: [facility] ENDOSCOPY;  Service: Endoscopy;  Laterality: N/A;  bedside/trach/vent  . MOUTH SURGERY     teeth removed.  Marland Kitchen PEG PLACEMENT N/A 02/03/2014   Procedure: PERCUTANEOUS ENDOSCOPIC GASTROSTOMY (PEG) PLACEMENT;  Surgeon: Ladene Artist, MD;  Location: [facility] ENDOSCOPY;  Service: Endoscopy;  Laterality: N/A;  . TRACHEOSTOMY  01/30/14   feinstein    Prior to Admission medications   Medication Sig Start Date End Date Taking? Authorizing Provider  azithromycin (ZITHROMAX) 250 MG tablet Take 1 tablet (250 mg total) by mouth daily. 06/11/15   Carlisle Cater, PA-C  naproxen (NAPROSYN) 250 MG tablet Take 500 mg by mouth daily as needed for moderate pain.    Historical Provider, MD  oxyCODONE-acetaminophen (PERCOCET/ROXICET) 5-325 MG tablet Take 1-2 tablets by mouth every 6 (six) hours as needed for severe pain. 06/11/15   Carlisle Cater, PA-C  Pseudoephedrine-DM-GG (ROBITUSSIN CF PO) Take 30 mLs by mouth 3 (three) times daily as needed (cough.).    Historical Provider, MD    Allergies Review of patient's allergies indicates no known allergies.  Family History  Problem Relation Age of Onset  . Cancer Mother     lung  . Cancer Father     kidney    Social  History Social History  Substance Use Topics  . Smoking status: Current Every Day Smoker    Packs/day: 1.50    Years: 20.00    Types: Cigarettes    Last attempt to quit: 01/15/2014  . Smokeless tobacco: Never Used  . Alcohol use 0.0 oz/week     Comment: pint- fifth of vodka a day    Review of Systems  Constitutional: Negative for fever. Eyes: Negative for visual changes. ENT: Negative for sore throat. Cardiovascular: Negative for chest pain. Respiratory: Negative for shortness of  breath. Gastrointestinal: Negative for abdominal pain, vomiting or diarrhea. Genitourinary: Negative for dysuria. Musculoskeletal: Negative for back pain. Skin: Negative for rash. Neurological: Negative for headaches, weakness or numbness.  ____________________________________________   PHYSICAL EXAM:  VITAL SIGNS: ED Triage Vitals [09/29/15 1159]  Enc Vitals Group     BP 139/88     Pulse Rate (!) 106     Resp 18     Temp 98.1 F (36.7 C)     Temp Source Oral     SpO2 98 %     Weight 160 lb (72.6 kg)     Height 6' (1.829 m)     Head Circumference      Peak Flow      Pain Score 0     Pain Loc      Pain Edu?      Excl. in [place]?     Constitutional: Alert and oriented. Well appearing and in no apparent distress. HEENT:      Head: Normocephalic and atraumatic.         Eyes: Conjunctivae are normal. Sclera is non-icteric. EOMI. PERRL      Mouth/Throat: Mucous membranes are moist.       Neck: Supple with no signs of meningismus. Cardiovascular: Regular rate and rhythm. No murmurs, gallops, or rubs. 2+ symmetrical distal pulses are present in all extremities. No JVD. Respiratory: Normal respiratory effort. Lungs are clear to auscultation bilaterally. No wheezes, crackles, or rhonchi.  Gastrointestinal: Soft, non tender, and non distended with positive bowel sounds. No rebound or guarding. Genitourinary: No CVA tenderness. Musculoskeletal: Nontender with normal range of motion in all extremities. No edema, cyanosis, or erythema of extremities. Neurologic: Normal speech and language. Face is symmetric. Moving all extremities. No gross focal neurologic deficits are appreciated. Skin: Skin is warm, dry and intact. No rash noted. Psychiatric: Mood and affect are normal. Speech and behavior are normal.  ____________________________________________   LABS (all labs ordered are listed, but only abnormal results are displayed)  Labs Reviewed - No data to  display ____________________________________________  EKG  none ____________________________________________  RADIOLOGY  none  ____________________________________________   PROCEDURES  Procedure(s) performed: None Procedures Critical Care performed:  None ____________________________________________   INITIAL IMPRESSION / ASSESSMENT AND PLAN / ED COURSE  45 y.o. male with a history of alcohol abuse, depression, COPD, and active smoker who presents for medical clearance for placement for alcohol detox. No signs or symptoms of withdrawal at this time. Patient is stable has no medical complaints. Physical exam is within normal limits. Patient is currently medically cleared for placement for alcohol detox. We'll discharge patient at this time and refer him back to [facility].  Clinical Course    Pertinent labs & imaging results that were available during my care of the patient were reviewed by me and considered in my medical decision making (see chart for details).    ____________________________________________   FINAL CLINICAL IMPRESSION(S) / ED DIAGNOSES  Final diagnoses:  Uncomplicated alcohol dependence ([place])      NEW MEDICATIONS STARTED DURING THIS VISIT:  New Prescriptions   No medications on file     Note:  This document was prepared using Dragon voice recognition software and may include unintentional dictation errors.    Rudene Re, MD 09/29/15 [phone]

## 2015-09-29 NOTE — Discharge Instructions (Signed)
You have been seen in the Emergency Department (ED) today for alcohol intoxication.  As we have discussed, please do NOT drink alcohol in excess as alcohol intoxication can be life threatening. NEVER drive or operate machinery after drinking alcohol.    Do NOT drive today.  Drink plenty of water or other clear liquids (Gatorade, Powerade, etc) for the next 24 hours to help your body recover and stay hydrated.  Follow-up care is a key part of your treatment and safety. Follow up with your doctor in the next 2-5 days. If you need help to stop drinking contact [facility] at [phone]  How can you care for yourself at home?  Be safe with medicines. Take your medicines exactly as prescribed. Call your doctor if you think you are having a problem with your medicine.  Your doctor may have prescribed disulfiram (Antabuse). Do not drink any alcohol while you are taking this medicine. You may have severe or even life-threatening side effects from even small amounts of alcohol.  If you were given medicine to prevent nausea, be sure to take it exactly as prescribed.  Before you take any medicine, tell your doctor if:  You have had a bad reaction to any medicines in the past.  You are taking other medicines, including over-the-counter ones, or have other health problems.  You are or could be pregnant. Be prepared to have some symptoms of withdrawal in the next few days which includes agitation, tremors, anxiety, sweating, headache, nausea, vomiting. Drink plenty of liquids in the next few days to prevent dehydration. Seek help if you need it to stop drinking. Getting counseling and joining a support group can help you stay sober. Try a support group such as Alcoholics Anonymous.  Avoid alcohol when you take medicines. It can react with many medicines and cause serious problems.  When should you call for help?  Call 911 anytime you think you may need emergency care. For example, call if:  You feel confused and  are seeing things that are not there.  You are thinking about killing yourself or hurting others.  You have a seizure.  You vomit blood or what looks like coffee grounds.  Call your doctor now or seek immediate medical care if:  You have trembling, restlessness, sweating, and other withdrawal symptoms that are new or that get worse.  Your withdrawal symptoms come back after not bothering you for days or weeks.  You can't stop vomiting.  Watch closely for changes in your health, and be sure to contact your doctor if:  You need help to stop drinking.

## 2015-11-18 ENCOUNTER — Emergency Department (HOSPITAL_COMMUNITY)
Admission: EM | Admit: 2015-11-18 | Discharge: 2015-11-18 | Disposition: A | Discharge status: Home / Self Care | Payer: Medicaid Other | Attending: Emergency Medicine | Admitting: Emergency Medicine

## 2015-11-18 ENCOUNTER — Emergency Department (HOSPITAL_COMMUNITY): Payer: Medicaid Other

## 2015-11-18 ENCOUNTER — Encounter (HOSPITAL_COMMUNITY): Payer: Self-pay | Admitting: Emergency Medicine

## 2015-11-18 DIAGNOSIS — F419 Anxiety disorder, unspecified: Secondary | ICD-10-CM | POA: Insufficient documentation

## 2015-11-18 DIAGNOSIS — Z720 Tobacco use: Secondary | ICD-10-CM

## 2015-11-18 DIAGNOSIS — J441 Chronic obstructive pulmonary disease with (acute) exacerbation: Secondary | ICD-10-CM | POA: Diagnosis not present

## 2015-11-18 DIAGNOSIS — F1010 Alcohol abuse, uncomplicated: Secondary | ICD-10-CM | POA: Insufficient documentation

## 2015-11-18 DIAGNOSIS — F17210 Nicotine dependence, cigarettes, uncomplicated: Secondary | ICD-10-CM | POA: Diagnosis not present

## 2015-11-18 DIAGNOSIS — R0602 Shortness of breath: Secondary | ICD-10-CM | POA: Diagnosis present

## 2015-11-18 LAB — URINE MICROSCOPIC-ADD ON: Bacteria, UA: NONE SEEN

## 2015-11-18 LAB — COMPREHENSIVE METABOLIC PANEL
ALK PHOS: 61 U/L (ref 38–126)
ALT: 33 U/L (ref 17–63)
AST: 63 U/L — ABNORMAL HIGH (ref 15–41)
Albumin: 3.8 g/dL (ref 3.5–5.0)
Anion gap: 12 (ref 5–15)
BILIRUBIN TOTAL: 0.8 mg/dL (ref 0.3–1.2)
BUN: 12 mg/dL (ref 6–20)
CALCIUM: 7.9 mg/dL — AB (ref 8.9–10.3)
CO2: 21 mmol/L — ABNORMAL LOW (ref 22–32)
CREATININE: 0.86 mg/dL (ref 0.61–1.24)
Chloride: 108 mmol/L (ref 101–111)
Glucose, Bld: 89 mg/dL (ref 65–99)
Potassium: 3 mmol/L — ABNORMAL LOW (ref 3.5–5.1)
Sodium: 141 mmol/L (ref 135–145)
Total Protein: 6.5 g/dL (ref 6.5–8.1)

## 2015-11-18 LAB — CBC WITH DIFFERENTIAL/PLATELET
BASOS PCT: 1 %
Basophils Absolute: 0.1 10*3/uL (ref 0.0–0.1)
EOS ABS: 0.2 10*3/uL (ref 0.0–0.7)
EOS PCT: 2 %
HCT: 46.1 % (ref 39.0–52.0)
HEMOGLOBIN: 16.1 g/dL (ref 13.0–17.0)
Lymphocytes Relative: 35 %
Lymphs Abs: 2.4 10*3/uL (ref 0.7–4.0)
MCH: 33.3 pg (ref 26.0–34.0)
MCHC: 34.9 g/dL (ref 30.0–36.0)
MCV: 95.2 fL (ref 78.0–100.0)
Monocytes Absolute: 0.3 10*3/uL (ref 0.1–1.0)
Monocytes Relative: 4 %
NEUTROS PCT: 58 %
Neutro Abs: 4.1 10*3/uL (ref 1.7–7.7)
PLATELETS: 177 10*3/uL (ref 150–400)
RBC: 4.84 MIL/uL (ref 4.22–5.81)
RDW: 14.4 % (ref 11.5–15.5)
WBC: 7 10*3/uL (ref 4.0–10.5)

## 2015-11-18 LAB — URINALYSIS, ROUTINE W REFLEX MICROSCOPIC
BILIRUBIN URINE: NEGATIVE
Glucose, UA: NEGATIVE mg/dL
Ketones, ur: NEGATIVE mg/dL
Leukocytes, UA: NEGATIVE
NITRITE: NEGATIVE
PROTEIN: 100 mg/dL — AB
SPECIFIC GRAVITY, URINE: 1.034 — AB (ref 1.005–1.030)
pH: 6.5 (ref 5.0–8.0)

## 2015-11-18 LAB — LIPASE, BLOOD: LIPASE: 49 U/L (ref 11–51)

## 2015-11-18 LAB — ETHANOL: ALCOHOL ETHYL (B): 275 mg/dL — AB (ref <5)

## 2015-11-18 MED ORDER — CHLORDIAZEPOXIDE HCL 25 MG PO CAPS: ORAL_CAPSULE | ORAL | 0 refills | Status: DC

## 2015-11-18 MED ORDER — ALBUTEROL SULFATE HFA 108 (90 BASE) MCG/ACT IN AERS
1.0000 | INHALATION_SPRAY | Freq: Four times a day (QID) | RESPIRATORY_TRACT | Status: DC | PRN
  Filled 2015-11-18: qty 6.7

## 2015-11-18 MED ORDER — AEROCHAMBER PLUS FLO-VU MEDIUM MISC
1.0000 | Freq: Once | Status: AC
  Administered 2015-11-18: 1
  Filled 2015-11-18: qty 1

## 2015-11-18 MED ORDER — IPRATROPIUM-ALBUTEROL 0.5-2.5 (3) MG/3ML IN SOLN
3.0000 mL | Freq: Once | RESPIRATORY_TRACT | Status: AC
  Administered 2015-11-18: 3 mL via RESPIRATORY_TRACT
  Filled 2015-11-18: qty 3

## 2015-11-18 MED ORDER — LORAZEPAM 2 MG/ML IJ SOLN
1.0000 mg | Freq: Once | INTRAMUSCULAR | Status: AC
Start: 2015-11-18 — End: 2015-11-18
  Administered 2015-11-18: 1 mg via INTRAVENOUS
  Filled 2015-11-18: qty 1

## 2015-11-18 MED ORDER — LORAZEPAM 1 MG PO TABS
1.0000 mg | ORAL_TABLET | Freq: Once | ORAL | Status: AC
  Administered 2015-11-18: 1 mg via ORAL
  Filled 2015-11-18: qty 1

## 2015-11-18 MED ORDER — METHYLPREDNISOLONE SODIUM SUCC 125 MG IJ SOLR
125.0000 mg | Freq: Once | INTRAMUSCULAR | Status: AC
  Administered 2015-11-18: 125 mg via INTRAVENOUS
  Filled 2015-11-18: qty 2

## 2015-11-18 MED ORDER — PREDNISONE 10 MG (21) PO TBPK: 10.0000 mg | ORAL_TABLET | Freq: Every day | ORAL | 0 refills | Status: DC

## 2015-11-18 MED ORDER — SODIUM CHLORIDE 0.9 % IV BOLUS (SEPSIS)
1000.0000 mL | Freq: Once | INTRAVENOUS | Status: AC
  Administered 2015-11-18: 1000 mL via INTRAVENOUS

## 2015-11-18 NOTE — ED Notes (Signed)
Bed: WA01 Expected date:  Expected time:  Means of arrival:  Comments: EMS Cough/wheezing

## 2015-11-18 NOTE — ED Triage Notes (Signed)
Per EMS pt complaint of semi productive cough and SOB unrelieved by inhaler at home. Pt given 5 mg of albuterol en route related to slight E wheezing; pt lungs fields clear at present time.

## 2015-11-18 NOTE — ED Provider Notes (Signed)
[facility] DEPT Provider Note   CSN: [id number] Arrival date & time: 11/18/15  1611     History   Chief Complaint Chief Complaint  Patient presents with  . Cough  . Shortness of Breath    HPI Allen Wood is a 45 y.o. male.  Pt here with sob and cough.  The pt has wheezing as well.  He said that he has been drinking a lot and feels like he is coming out of his skin.        Past Medical History:  Diagnosis Date  . Alcohol abuse   . Anxiety   . COPD with asthma ([place])   . COPD with emphysema ([place])   . Delirium tremens ([place])   . Depression   . Pneumonia     Patient Active Problem List   Diagnosis Date Noted  . Respiratory failure ([place]) 02/12/2014  . HCAP (healthcare-associated pneumonia)   . Severe sepsis ([place])   . 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 Status post gastrostomy ([place]) 02/08/2014  . Anemia due to chronic blood loss   . Occult blood in stools   . Feeding difficulties   . Dysphagia, oropharyngeal phase   . Alcohol withdrawal delirium ([place])   . Seizure ([place])   . Anemia, chronic disease 01/16/2014  . Solitary pulmonary nodule 01/16/2014  . Pleural effusion 01/16/2014  . Fatigue 01/15/2014  . Nicotine abuse 09/14/2013  . EtOH dependence ([place]) 09/14/2013    Past Surgical History:  Procedure Laterality Date  . ESOPHAGOGASTRODUODENOSCOPY N/A 02/03/2014   Procedure: ESOPHAGOGASTRODUODENOSCOPY (EGD);  Surgeon: Ladene Artist, MD;  Location: [facility] ENDOSCOPY;  Service: Endoscopy;  Laterality: N/A;  bedside/trach/vent  . MOUTH SURGERY     teeth removed.  Marland Kitchen PEG PLACEMENT N/A 02/03/2014   Procedure: PERCUTANEOUS ENDOSCOPIC GASTROSTOMY (PEG) PLACEMENT;  Surgeon: Ladene Artist, MD;  Location[facility] ENDOSCOPY;  Service: Endoscopy;  Laterality: N/A;  . TRACHEOSTOMY  01/30/14   feinstein       Home Medications    Prior to Admission medications   Medication Sig Start Date End Date Taking? Authorizing  Provider  albuterol (PROVENTIL HFA;VENTOLIN HFA) 108 (90 Base) MCG/ACT inhaler Inhale 2 puffs into the lungs every 6 (six) hours as needed for wheezing or shortness of breath.   Yes Historical Provider, MD  naproxen (NAPROSYN) 250 MG tablet Take 500 mg by mouth daily as needed for moderate pain.   Yes Historical Provider, MD  Pseudoephedrine-DM-GG (ROBITUSSIN CF PO) Take 30 mLs by mouth 3 (three) times daily as needed (cough.).   Yes Historical Provider, MD  azithromycin (ZITHROMAX) 250 MG tablet Take 1 tablet (250 mg total) by mouth daily. Patient not taking: Reported on 11/18/2015 06/11/15   Carlisle Cater, PA-C  chlordiazePOXIDE (LIBRIUM) 25 MG capsule 50mg  PO TID x 1D, then 25-50mg  PO BID X 1D, then 25-50mg  PO QD X 1D 11/18/15   Isla Pence, MD  oxyCODONE-acetaminophen (PERCOCET/ROXICET) 5-325 MG tablet Take 1-2 tablets by mouth every 6 (six) hours as needed for severe pain. Patient not taking: Reported on 11/18/2015 06/11/15   Carlisle Cater, PA-C  predniSONE (STERAPRED UNI-PAK 21 TAB) 10 MG (21) TBPK tablet Take 1 tablet (10 mg total) by mouth daily. Take 6 tabs by mouth daily  for 2 days, then 5 tabs for 2 days, then 4 tabs for 2 days, then 3 tabs for 2 days, 2 tabs for 2 days, then 1 tab by mouth daily for 2 days 11/18/15  Isla Pence, MD    Family History Family History  Problem Relation Age of Onset  . Cancer Mother     lung  . Cancer Father     kidney    Social History Social History  Substance Use Topics  . Smoking status: Current Every Day Smoker    Packs/day: 1.50    Years: 20.00    Types: Cigarettes    Last attempt to quit: 01/15/2014  . Smokeless tobacco: Never Used  . Alcohol use 0.0 oz/week     Comment: pint- fifth of vodka a day     Allergies   Review of patient's allergies indicates no known allergies.   Review of Systems Review of Systems  Respiratory: Positive for cough, shortness of breath and wheezing.   Psychiatric/Behavioral: The patient is  nervous/anxious.   All other systems reviewed and are negative.    Physical Exam Updated Vital Signs BP 137/90 (BP Location: Right Arm)   Pulse 86   Temp 98.1 F (36.7 C) (Oral)   Resp 10   Ht 5\' 11"  (1.803 m)   Wt 150 lb (68 kg)   SpO2 96%   BMI 20.92 kg/m   Physical Exam  Constitutional: He is oriented to person, place, and time. He appears well-developed and well-nourished.  HENT:  Head: Normocephalic and atraumatic.  Right Ear: External ear normal.  Left Ear: External ear normal.  Nose: Nose normal.  Mouth/Throat: Oropharynx is clear and moist.  Eyes: Conjunctivae and EOM are normal. Pupils are equal, round, and reactive to light.  Neck: Normal range of motion. Neck supple.  Cardiovascular: Normal rate, regular rhythm, normal heart sounds and intact distal pulses.   Pulmonary/Chest: Effort normal. He has wheezes.  Abdominal: Soft. Bowel sounds are normal.  Musculoskeletal: Normal range of motion.  Neurological: He is alert and oriented to person, place, and time.  Skin: Skin is warm.  Psychiatric: He has a normal mood and affect. His behavior is normal. Judgment and thought content normal.  Nursing note and vitals reviewed.    ED Treatments / Results  Labs (all labs ordered are listed, but only abnormal results are displayed) Labs Reviewed  COMPREHENSIVE METABOLIC PANEL - Abnormal; Notable for the following:       Result Value   Potassium 3.0 (*)    CO2 21 (*)    Calcium 7.9 (*)    AST 63 (*)    All other components within normal limits  URINALYSIS, ROUTINE W REFLEX MICROSCOPIC (NOT AT [facility]) - Abnormal; Notable for the following:    Color, Urine AMBER (*)    Specific Gravity, Urine 1.034 (*)    Hgb urine dipstick TRACE (*)    Protein, ur 100 (*)    All other components within normal limits  ETHANOL - Abnormal; Notable for the following:    Alcohol, Ethyl (B) 275 (*)    All other components within normal limits  URINE MICROSCOPIC-ADD ON - Abnormal;  Notable for the following:    Squamous Epithelial / LPF 0-5 (*)    All other components within normal limits  CBC WITH DIFFERENTIAL/PLATELET  LIPASE, BLOOD    EKG  EKG Interpretation  Date/Time:  Monday November 18 2015 16:35:18 EDT Ventricular Rate:  81 PR Interval:    QRS Duration: 94 QT Interval:  386 QTC Calculation: 448 R Axis:   84 Text Interpretation:  Sinus rhythm Short PR interval Nonspecific T abnormalities, lateral leads Confirmed by Akiva Brassfield MD, Troye Hiemstra ([id number]) on 11/18/2015 4:41:06 PM  Radiology Dg Chest 2 View  Result Date: 11/18/2015 CLINICAL DATA:  Shortness of breath EXAM: CHEST  2 VIEW COMPARISON:  07/04/2015 chest radiograph. FINDINGS: Stable cardiomediastinal silhouette with normal heart size. No pneumothorax. No pleural effusion. Hyperinflated lungs. No pulmonary edema. No acute consolidative airspace disease. IMPRESSION: Hyperinflated lungs, suggesting COPD. No acute cardiopulmonary disease. Electronically Signed   By: Ilona Sorrel M.D.   On: 11/18/2015 17:32    Procedures Procedures (including critical care time)  Medications Ordered in ED Medications  albuterol (PROVENTIL HFA;VENTOLIN HFA) 108 (90 Base) MCG/ACT inhaler 1-2 puff (not administered)  AEROCHAMBER PLUS FLO-VU MEDIUM MISC 1 each (not administered)  LORazepam (ATIVAN) tablet 1 mg (not administered)  sodium chloride 0.9 % bolus 1,000 mL (1,000 mLs Intravenous New Bag/Given 11/18/15 1703)  ipratropium-albuterol (DUONEB) 0.5-2.5 (3) MG/3ML nebulizer solution 3 mL (3 mLs Nebulization Given 11/18/15 1646)  methylPREDNISolone sodium succinate (SOLU-MEDROL) 125 mg/2 mL injection 125 mg (125 mg Intravenous Given 11/18/15 1704)  LORazepam (ATIVAN) injection 1 mg (1 mg Intravenous Given 11/18/15 1704)     Initial Impression / Assessment and Plan / ED Course  I have reviewed the triage vital signs and the nursing notes.  Pertinent labs & imaging results that were available during my care of the  patient were reviewed by me and considered in my medical decision making (see chart for details).  Clinical Course   Pt is feeling much better.  He is given a Warden/ranger about outpatient etoh treatment.  He wants a librium taper.  The pt has an appt with his pcp tomorrow.  He is encouraged to keep that appt.  He is encouraged to stop smoking and to stop drinking.  He is given an albuterol inhaler with a spacer.    Final Clinical Impressions(s) / ED Diagnoses   Final diagnoses:  COPD exacerbation (HCC)  Alcohol abuse  Anxiety  Tobacco abuse    New Prescriptions New Prescriptions   CHLORDIAZEPOXIDE (LIBRIUM) 25 MG CAPSULE    50mg  PO TID x 1D, then 25-50mg  PO BID X 1D, then 25-50mg  PO QD X 1D   PREDNISONE (STERAPRED UNI-PAK 21 TAB) 10 MG (21) TBPK TABLET    Take 1 tablet (10 mg total) by mouth daily. Take 6 tabs by mouth daily  for 2 days, then 5 tabs for 2 days, then 4 tabs for 2 days, then 3 tabs for 2 days, 2 tabs for 2 days, then 1 tab by mouth daily for 2 days     Isla Pence, MD 11/18/15 1814

## 2015-12-08 ENCOUNTER — Encounter (HOSPITAL_COMMUNITY): Payer: Self-pay | Admitting: Emergency Medicine

## 2015-12-08 ENCOUNTER — Inpatient Hospital Stay (HOSPITAL_COMMUNITY)
Admission: EM | Admit: 2015-12-08 | Discharge: 2015-12-10 | DRG: 897 | Disposition: A | Discharge status: Home / Self Care | Payer: Medicaid Other | Attending: Internal Medicine | Admitting: Internal Medicine

## 2015-12-08 DIAGNOSIS — F10939 Alcohol use, unspecified with withdrawal, unspecified: Secondary | ICD-10-CM

## 2015-12-08 DIAGNOSIS — R569 Unspecified convulsions: Secondary | ICD-10-CM | POA: Diagnosis not present

## 2015-12-08 DIAGNOSIS — F10239 Alcohol dependence with withdrawal, unspecified: Secondary | ICD-10-CM | POA: Diagnosis not present

## 2015-12-08 DIAGNOSIS — J439 Emphysema, unspecified: Secondary | ICD-10-CM | POA: Diagnosis present

## 2015-12-08 DIAGNOSIS — Z720 Tobacco use: Secondary | ICD-10-CM

## 2015-12-08 DIAGNOSIS — F10288 Alcohol dependence with other alcohol-induced disorder: Secondary | ICD-10-CM

## 2015-12-08 DIAGNOSIS — F10230 Alcohol dependence with withdrawal, uncomplicated: Secondary | ICD-10-CM

## 2015-12-08 DIAGNOSIS — Z8701 Personal history of pneumonia (recurrent): Secondary | ICD-10-CM

## 2015-12-08 DIAGNOSIS — F1020 Alcohol dependence, uncomplicated: Secondary | ICD-10-CM | POA: Diagnosis present

## 2015-12-08 DIAGNOSIS — F17210 Nicotine dependence, cigarettes, uncomplicated: Secondary | ICD-10-CM | POA: Diagnosis present

## 2015-12-08 DIAGNOSIS — G4089 Other seizures: Secondary | ICD-10-CM | POA: Diagnosis present

## 2015-12-08 HISTORY — DX: Unspecified convulsions: R56.9

## 2015-12-08 LAB — COMPREHENSIVE METABOLIC PANEL
ALT: 44 U/L (ref 17–63)
AST: 63 U/L — ABNORMAL HIGH (ref 15–41)
Albumin: 3.6 g/dL (ref 3.5–5.0)
Alkaline Phosphatase: 60 U/L (ref 38–126)
Anion gap: 11 (ref 5–15)
BILIRUBIN TOTAL: 0.6 mg/dL (ref 0.3–1.2)
BUN: 7 mg/dL (ref 6–20)
CALCIUM: 8.2 mg/dL — AB (ref 8.9–10.3)
CHLORIDE: 103 mmol/L (ref 101–111)
CO2: 21 mmol/L — ABNORMAL LOW (ref 22–32)
CREATININE: 0.98 mg/dL (ref 0.61–1.24)
Glucose, Bld: 89 mg/dL (ref 65–99)
Potassium: 3.7 mmol/L (ref 3.5–5.1)
Sodium: 135 mmol/L (ref 135–145)
TOTAL PROTEIN: 5.8 g/dL — AB (ref 6.5–8.1)

## 2015-12-08 LAB — CBG MONITORING, ED: Glucose-Capillary: 86 mg/dL (ref 65–99)

## 2015-12-08 LAB — RAPID URINE DRUG SCREEN, HOSP PERFORMED
AMPHETAMINES: NOT DETECTED
Barbiturates: NOT DETECTED
Benzodiazepines: POSITIVE — AB
COCAINE: NOT DETECTED
OPIATES: NOT DETECTED
TETRAHYDROCANNABINOL: NOT DETECTED

## 2015-12-08 LAB — CBC WITH DIFFERENTIAL/PLATELET
BASOS ABS: 0 10*3/uL (ref 0.0–0.1)
BASOS PCT: 0 %
EOS ABS: 0.1 10*3/uL (ref 0.0–0.7)
EOS PCT: 1 %
HCT: 39.7 % (ref 39.0–52.0)
HEMOGLOBIN: 13.4 g/dL (ref 13.0–17.0)
LYMPHS ABS: 1.3 10*3/uL (ref 0.7–4.0)
Lymphocytes Relative: 16 %
MCH: 33.6 pg (ref 26.0–34.0)
MCHC: 33.8 g/dL (ref 30.0–36.0)
MCV: 99.5 fL (ref 78.0–100.0)
Monocytes Absolute: 0.5 10*3/uL (ref 0.1–1.0)
Monocytes Relative: 6 %
NEUTROS PCT: 77 %
Neutro Abs: 6 10*3/uL (ref 1.7–7.7)
PLATELETS: 143 10*3/uL — AB (ref 150–400)
RBC: 3.99 MIL/uL — AB (ref 4.22–5.81)
RDW: 14.3 % (ref 11.5–15.5)
WBC: 7.8 10*3/uL (ref 4.0–10.5)

## 2015-12-08 MED ORDER — LORAZEPAM 2 MG/ML IJ SOLN: 0.0000 mg | Freq: Four times a day (QID) | INTRAMUSCULAR | Status: DC

## 2015-12-08 MED ORDER — LORAZEPAM 2 MG/ML IJ SOLN: 0.0000 mg | Freq: Two times a day (BID) | INTRAMUSCULAR | Status: DC

## 2015-12-08 MED ORDER — THIAMINE HCL 100 MG/ML IJ SOLN: 100.0000 mg | Freq: Every day | INTRAMUSCULAR | Status: DC

## 2015-12-08 MED ORDER — ACETAMINOPHEN 650 MG RE SUPP: 650.0000 mg | RECTAL | Status: DC | PRN

## 2015-12-08 MED ORDER — ACETAMINOPHEN 325 MG PO TABS: 650.0000 mg | ORAL_TABLET | ORAL | Status: DC | PRN

## 2015-12-08 MED ORDER — ADULT MULTIVITAMIN W/MINERALS CH
1.0000 | ORAL_TABLET | Freq: Every day | ORAL | Status: DC
  Administered 2015-12-08 – 2015-12-10 (×3): 1 via ORAL
  Filled 2015-12-08 (×3): qty 1

## 2015-12-08 MED ORDER — LORAZEPAM 2 MG/ML IJ SOLN
2.0000 mg | Freq: Once | INTRAMUSCULAR | Status: AC
  Administered 2015-12-08: 2 mg via INTRAVENOUS
  Filled 2015-12-08: qty 1

## 2015-12-08 MED ORDER — ONDANSETRON HCL 4 MG PO TABS: 4.0000 mg | ORAL_TABLET | Freq: Four times a day (QID) | ORAL | Status: DC | PRN

## 2015-12-08 MED ORDER — VITAMIN B-1 100 MG PO TABS
100.0000 mg | ORAL_TABLET | Freq: Every day | ORAL | Status: DC
  Administered 2015-12-08 – 2015-12-10 (×3): 100 mg via ORAL
  Filled 2015-12-08 (×3): qty 1

## 2015-12-08 MED ORDER — SODIUM CHLORIDE 0.9 % IV BOLUS (SEPSIS)
1000.0000 mL | Freq: Once | INTRAVENOUS | Status: AC
  Administered 2015-12-08: 1000 mL via INTRAVENOUS

## 2015-12-08 MED ORDER — FOLIC ACID 1 MG PO TABS
1.0000 mg | ORAL_TABLET | Freq: Every day | ORAL | Status: DC
  Administered 2015-12-08 – 2015-12-10 (×3): 1 mg via ORAL
  Filled 2015-12-08 (×3): qty 1

## 2015-12-08 MED ORDER — SODIUM CHLORIDE 0.9 % IV SOLN
75.0000 mL/h | INTRAVENOUS | Status: DC
  Administered 2015-12-08: 75 mL/h via INTRAVENOUS

## 2015-12-08 MED ORDER — ENOXAPARIN SODIUM 40 MG/0.4ML ~~LOC~~ SOLN
40.0000 mg | SUBCUTANEOUS | Status: DC
  Administered 2015-12-08 – 2015-12-09 (×2): 40 mg via SUBCUTANEOUS
  Filled 2015-12-08 (×2): qty 0.4

## 2015-12-08 MED ORDER — LORAZEPAM 2 MG/ML IJ SOLN
1.0000 mg | Freq: Four times a day (QID) | INTRAMUSCULAR | Status: DC | PRN
  Administered 2015-12-09: 1 mg via INTRAVENOUS
  Filled 2015-12-08: qty 1

## 2015-12-08 MED ORDER — LORAZEPAM 2 MG/ML IJ SOLN: 1.0000 mg | INTRAMUSCULAR | Status: DC | PRN

## 2015-12-08 MED ORDER — NICOTINE 21 MG/24HR TD PT24
21.0000 mg | MEDICATED_PATCH | Freq: Every day | TRANSDERMAL | Status: DC
  Administered 2015-12-08 – 2015-12-10 (×3): 21 mg via TRANSDERMAL
  Filled 2015-12-08 (×3): qty 1

## 2015-12-08 MED ORDER — ALBUTEROL SULFATE (2.5 MG/3ML) 0.083% IN NEBU: 3.0000 mL | INHALATION_SOLUTION | Freq: Four times a day (QID) | RESPIRATORY_TRACT | Status: DC | PRN

## 2015-12-08 MED ORDER — ONDANSETRON HCL 4 MG/2ML IJ SOLN: 4.0000 mg | Freq: Four times a day (QID) | INTRAMUSCULAR | Status: DC | PRN

## 2015-12-08 MED ORDER — VITAMIN B-1 100 MG PO TABS: 100.0000 mg | ORAL_TABLET | Freq: Every day | ORAL | Status: DC

## 2015-12-08 MED ORDER — LORAZEPAM 1 MG PO TABS
1.0000 mg | ORAL_TABLET | Freq: Four times a day (QID) | ORAL | Status: DC | PRN
  Administered 2015-12-09: 1 mg via ORAL
  Filled 2015-12-08: qty 1

## 2015-12-08 NOTE — ED Notes (Signed)
CBG 86. 

## 2015-12-08 NOTE — ED Notes (Signed)
Gave pt. Cup of water, ok'd per Dr. Regenia Skeeter

## 2015-12-08 NOTE — ED Notes (Signed)
The pt is alert no pain anywhere he reports that he feels fine  Asking for his wife that has not arrived

## 2015-12-08 NOTE — ED Notes (Signed)
RN attempted to call report; RN to call back; 

## 2015-12-08 NOTE — ED Notes (Signed)
Needs ekg too jittery interference on the monitor

## 2015-12-08 NOTE — ED Notes (Signed)
He feels better

## 2015-12-08 NOTE — ED Notes (Signed)
Care handoff to [facility] RN

## 2015-12-08 NOTE — ED Provider Notes (Signed)
[facility] DEPT Provider Note   CSN: [id number] Arrival date & time: 12/08/15  1538     History   Chief Complaint Chief Complaint  Patient presents with  . Seizures    HPI Infant Schulze is a 45 y.o. male.  HPI  45 year old male presents with a seizure that happened just prior to arrival. Patient has a history of COPD as well as alcohol abuse. Patient last drank alcohol 2 nights ago. He usually drinks a pint of alcohol per day. He is currently trying to stop alcohol use. He has also been on buspirone as put on by his PCP for anxiety. He was also put on Ativan to help with anxiety where he was started with a higher dose and then tapered off. Last took this about 3 days ago. Has not been feeling well the last few days including a poor appetite. This is been going on for several weeks but he's been worse over the last couple days. Denies fevers, current headaches or recent headaches, weakness/numbness. He denies any prior history of seizures although his wife states he had one when he was in a coma. Today she states that the family was laughing at a joke when all of a sudden his mouth seemed to lock open his, eyes rolled back to the back of his head, and then he started shaking diffusely. Last couple minutes and then recurred before he woke up. After that he woke up but it was slow and he slowly progress back to normal. Currently at baseline. Had a little bit of bleeding out of his mouth but denies any current lip or tongue pain. No confusion, AMS, hallucinations.  Past Medical History:  Diagnosis Date  . Alcohol abuse   . Anxiety   . COPD with asthma ([place])   . COPD with emphysema ([place])   . Delirium tremens ([place])   . Depression   . Pneumonia     Patient Active Problem List   Diagnosis Date Noted  . Alcohol withdrawal seizure ([place]) 12/08/2015  . Respiratory failure ([place]) 02/12/2014  . HCAP (healthcare-associated pneumonia)   . Severe sepsis ([place])   . 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 Status post tracheostomy ([place]) 02/08/2014  . Status post gastrostomy ([place]) 02/08/2014  . Anemia due to chronic blood loss   . Occult blood in stools   . Feeding difficulties   . Dysphagia, oropharyngeal phase   . Alcohol withdrawal delirium ([place])   . Seizure ([place])   . Anemia, chronic disease 01/16/2014  . Solitary pulmonary nodule 01/16/2014  . Pleural effusion 01/16/2014  . Fatigue 01/15/2014  . Nicotine abuse 09/14/2013  . EtOH dependence ([place]) 09/14/2013    Past Surgical History:  Procedure Laterality Date  . ESOPHAGOGASTRODUODENOSCOPY N/A 02/03/2014   Procedure: ESOPHAGOGASTRODUODENOSCOPY (EGD);  Surgeon: Ladene Artist, MD;  Location: [facility] ENDOSCOPY;  Service: Endoscopy;  Laterality: N/A;  bedside/trach/vent  . MOUTH SURGERY     teeth removed.  Marland Kitchen PEG PLACEMENT N/A 02/03/2014   Procedure: PERCUTANEOUS ENDOSCOPIC GASTROSTOMY (PEG) PLACEMENT;  Surgeon: Ladene Artist, MD;  Location: [facility] ENDOSCOPY;  Service: Endoscopy;  Laterality: N/A;  . TRACHEOSTOMY  01/30/14   feinstein       Home Medications    Prior to Admission medications   Medication Sig Start Date End Date Taking? Authorizing Provider  albuterol (PROVENTIL HFA;VENTOLIN HFA) 108 (90 Base) MCG/ACT inhaler Inhale 2 puffs into the lungs every 6 (six) hours as needed for wheezing or shortness of breath.   Yes  Historical Provider, MD  chlordiazePOXIDE (LIBRIUM) 25 MG capsule 50mg  PO TID x 1D, then 25-50mg  PO BID X 1D, then 25-50mg  PO QD X 1D Patient taking differently: Take 25-50 mg by mouth daily as needed for anxiety.  11/18/15  Yes Isla Pence, MD  oxyCODONE-acetaminophen (PERCOCET/ROXICET) 5-325 MG tablet Take 1-2 tablets by mouth every 6 (six) hours as needed for severe pain. Patient not taking: Reported on 11/18/2015 06/11/15   Carlisle Cater, PA-C    Family History Family History  Problem Relation Age of Onset  . Cancer Mother     lung  . Cancer Father     kidney    Social History Social  History  Substance Use Topics  . Smoking status: Current Every Day Smoker    Packs/day: 1.00    Years: 20.00    Types: Cigarettes    Last attempt to quit: 01/15/2014  . Smokeless tobacco: Never Used  . Alcohol use 0.0 oz/week     Comment: pint- fifth of vodka a day; quit 12/06/15     Allergies   Patient has no known allergies.   Review of Systems Review of Systems  Constitutional: Positive for fatigue. Negative for fever.  Respiratory: Negative for shortness of breath.   Cardiovascular: Negative for chest pain.  Gastrointestinal: Negative for abdominal pain and vomiting.  Neurological: Positive for seizures. Negative for weakness, numbness and headaches.  Psychiatric/Behavioral: Negative for confusion and hallucinations.  All other systems reviewed and are negative.    Physical Exam Updated Vital Signs BP 117/86 (BP Location: Right Arm)   Pulse 90   Temp 99.8 F (37.7 C) (Oral)   Resp 25   Ht 5\' 11"  (1.803 m)   Wt 155 lb (70.3 kg)   SpO2 96%   BMI 21.62 kg/m   Physical Exam  Constitutional: He is oriented to person, place, and time. He appears well-developed and well-nourished. No distress.  HENT:  Head: Normocephalic and atraumatic.  Right Ear: External ear normal.  Left Ear: External ear normal.  Nose: Nose normal.  No obvious oral lesions or injury  Eyes: EOM are normal. Pupils are equal, round, and reactive to light. Right eye exhibits no discharge. Left eye exhibits no discharge.  Neck: Normal range of motion. Neck supple.  No meningismus  Cardiovascular: Normal rate, regular rhythm and normal heart sounds.   Pulmonary/Chest: Effort normal. He has wheezes (mild expiratory wheezes diffusely).  Abdominal: Soft. There is no tenderness.  Musculoskeletal: He exhibits no edema.  Neurological: He is alert and oriented to person, place, and time.  CN 3-12 grossly intact. 5/5 strength in all 4 extremities. Grossly normal sensation. Normal finger to nose.  Prominent tremor in upper extremities  Skin: Skin is warm and dry. He is not diaphoretic.  Nursing note and vitals reviewed.    ED Treatments / Results  Labs (all labs ordered are listed, but only abnormal results are displayed) Labs Reviewed  COMPREHENSIVE METABOLIC PANEL - Abnormal; Notable for the following:       Result Value   CO2 21 (*)    Calcium 8.2 (*)    Total Protein 5.8 (*)    AST 63 (*)    All other components within normal limits  CBC WITH DIFFERENTIAL/PLATELET - Abnormal; Notable for the following:    RBC 3.99 (*)    Platelets 143 (*)    All other components within normal limits  RAPID URINE DRUG SCREEN, HOSP PERFORMED - Abnormal; Notable for the following:    Benzodiazepines  POSITIVE (*)    All other components within normal limits  ETHANOL  CBG MONITORING, ED    EKG  EKG Interpretation  Date/Time:  Sunday December 08 2015 16:36:10 EST Ventricular Rate:  87 PR Interval:    QRS Duration: 83 QT Interval:  357 QTC Calculation: 430 R Axis:   85 Text Interpretation:  Normal sinus rhythm Short PR interval Baseline wander in lead(s) II no acute ST/T changes no significant change since Nov 18 2015 Confirmed by Regenia Skeeter MD, Gable Odonohue [phone]) on 12/08/2015 4:41:02 PM       Radiology No results found.  Procedures Procedures (including critical care time)  Medications Ordered in ED Medications  LORazepam (ATIVAN) injection 0-4 mg (not administered)    Followed by  LORazepam (ATIVAN) injection 0-4 mg (not administered)  thiamine (VITAMIN B-1) tablet 100 mg (not administered)    Or  thiamine (B-1) injection 100 mg (not administered)  nicotine (NICODERM CQ - dosed in mg/24 hours) patch 21 mg (not administered)  LORazepam (ATIVAN) injection 2 mg (2 mg Intravenous Given 12/08/15 1647)  sodium chloride 0.9 % bolus 1,000 mL (0 mLs Intravenous Stopped 12/08/15 1733)     Initial Impression / Assessment and Plan / ED Course  I have reviewed the triage vital signs  and the nursing notes.  Pertinent labs & imaging results that were available during my care of the patient were reviewed by me and considered in my medical decision making (see chart for details).  Clinical Course as of Dec 08 1855  Nancy Fetter Dec 08, 2015  1626 Will give ativan, check CIWA, labs, monitor. Probably this seizure was from ETOH withdrawal, possibly from ativan withdrawal as well. Currently neurologically intact  [SG]  1844 Dr. Lorin Mercy to admit, tele obs  [SG]    Clinical Course User Index [SG] Sherwood Gambler, MD    Patient's initial CIWA is 25. He is not hallucinating or altered. He has had no seizure-like activity while in the ED. He was given Ativan and will be placed on close monitoring and CIWA protocol. I do not think CT head is needed as he is awake and alert and has a benign neurologic exam besides the tremors. Discussed with the hospitalist, will admit for alcohol withdrawal.   Final Clinical Impressions(s) / ED Diagnoses   Final diagnoses:  Alcohol withdrawal seizure without complication [facility])    New Prescriptions New Prescriptions   No medications on file     Sherwood Gambler, MD 12/08/15 1858

## 2015-12-08 NOTE — ED Triage Notes (Signed)
PT comes from home for seizure like activity that lasted one minute. Witnessed by wife. PT is attempting to detox from ETOH and has not had any alcohol in two days. PT states he drank a pint of liquor per evening prior to stopping. PT was slightly confused when EMS arrived. PT now alert and oriented x4

## 2015-12-08 NOTE — H&P (Signed)
History and Physical    Allen Wood [id number] DOB: October 21, 1970 DOA: 12/08/2015  PCP: Shirline Frees, MD Consultants:  None Patient coming from: home - lives with wife, son, Marliss Czar; Wife, [phone]  Chief Complaint: possible seizure  HPI: Allen Wood is a 45 y.o. male with medical history significant of alcohol dependence with DTs but no seizure activity, severe PNA with septic shock requiring trach/PEG (now reversed), and COPD presenting following an episode at home that was concerning for seizure.  He reports that he was sitting at home and his wife told a funny story.  He started to laugh and his bottom jaw cramped and closed his throat where he felt like he couldn't breathe for a few seconds.  Family freaked out and called the ambulance, said he had a seizure.  (On further discussion, it is clear that he is unhappy with his family about this ER trip and the report of seizure activity.  He is quite worried about losing his driving privileges and says that as a result he almost refused to come to the hospital.)  By his wife and son's report: she was telling him the story.  He started to laugh and then his face froze with his mouth open, his eyes rolled back and he shook all over.  Son called 31, and she grabbed hold of the patient to quell his generalized convulsions and keep him safe.  She reports that he stopped breathing and she saw blood coming out of mouth (which she later realized was from where he bit his tongue).  He started to come to and then the same thing happened again.  When EMS arrived, he was still post-ictal.    Patient is an alcoholic - started in his 8s.  Longest period of sobriety 7-8 months last year (following his prolonged recovery from PNA).  Restarted last summer.   Longest period of sobirety since then is a few weeks.  Last drink was Friday night - bourbon, 1 pint.  Usually drinks a pint a day.  No h/o seizures.  +h/o shakes.  Patient denies DTs but  wife reports that he has had them in the past.   ED Course: Per Dr. Regenia Skeeter: Patient's initial CIWA is 22. He is not hallucinating or altered. He has had no seizure-like activity while in the ED. He was given Ativan and will be placed on close monitoring and CIWA protocol. I do not think CT head is needed as he is awake and alert and has a benign neurologic exam besides the tremors. Discussed with the hospitalist, will admit for alcohol withdrawal.   Review of Systems: As per HPI; otherwise 10 point review of systems reviewed and negative.   Ambulatory Status:  Ambulates independently  Past Medical History:  Diagnosis Date  . Alcohol abuse   . Anxiety   . COPD with emphysema ([place])   . Delirium tremens ([place])   . Depression   . Pneumonia 2016   septic shock, trach/PEG/chest tube, both resolved  . Seizures ([place])    while intubated with PNA in 2016; post-ETOH in 2017    Past Surgical History:  Procedure Laterality Date  . ESOPHAGOGASTRODUODENOSCOPY N/A 02/03/2014   Procedure: ESOPHAGOGASTRODUODENOSCOPY (EGD);  Surgeon: Ladene Artist, MD;  Location: [facility] ENDOSCOPY;  Service: Endoscopy;  Laterality: N/A;  bedside/trach/vent  . MOUTH SURGERY     teeth removed.  Marland Kitchen PEG PLACEMENT N/A 02/03/2014   Procedure: PERCUTANEOUS ENDOSCOPIC GASTROSTOMY (PEG) PLACEMENT;  Surgeon: Ladene Artist, MD;  Location:  [place] [facility] ENDOSCOPY;  Service: Endoscopy;  Laterality: N/A;  . TRACHEOSTOMY  01/30/14   feinstein    Social History   Social History  . Marital status: Married    Spouse name: N/A  . Number of children: 3  . Years of education: N/A   Occupational History  . electrician    Social History Main Topics  . Smoking status: Current Every Day Smoker    Packs/day: 1.00    Years: 20.00    Types: Cigarettes    Last attempt to quit: 01/15/2014  . Smokeless tobacco: Never Used  . Alcohol use 0.0 oz/week     Comment: pint- fifth of vodka a day; quit 12/06/15  . Drug use: No  . Sexual activity: Not  Currently   Other Topics Concern  . Not on file   Social History Narrative  . No narrative on file    No Known Allergies  Family History  Problem Relation Age of Onset  . Cancer Mother     lung  . Cancer Father     kidney    Prior to Admission medications   Medication Sig Start Date End Date Taking? Authorizing Provider  albuterol (PROVENTIL HFA;VENTOLIN HFA) 108 (90 Base) MCG/ACT inhaler Inhale 2 puffs into the lungs every 6 (six) hours as needed for wheezing or shortness of breath.   Yes Historical Provider, MD  chlordiazePOXIDE (LIBRIUM) 25 MG capsule 50mg  PO TID x 1D, then 25-50mg  PO BID X 1D, then 25-50mg  PO QD X 1D Patient taking differently: Take 25-50 mg by mouth daily as needed for anxiety.  11/18/15  Yes Isla Pence, MD  oxyCODONE-acetaminophen (PERCOCET/ROXICET) 5-325 MG tablet Take 1-2 tablets by mouth every 6 (six) hours as needed for severe pain. Patient not taking: Reported on 11/18/2015 06/11/15   Carlisle Cater, PA-C    Physical Exam: Vitals:   12/08/15 1749 12/08/15 1900 12/08/15 1938 12/08/15 2000  BP: 117/86 121/85 127/87 110/82  Pulse: 90 89 90 90  Resp: 25 14 19 24   Temp:      TempSrc:      SpO2: 96% 94% 99% 95%  Weight:      Height:         General:  Appears calm and comfortable and is NAD Eyes:  PERRL, EOMI, normal lids, iris ENT:  grossly normal hearing, lips & tongue, mmm; there is a 4-5 mm laceration along the right tongue lateral margin Neck:  no LAD, masses or thyromegaly Cardiovascular:  RRR, no m/r/g. No LE edema.  Respiratory:  CTA bilaterally, no w/r/r. Normal respiratory effort. Abdomen:  soft, ntnd, NABS Skin:  no rash or induration seen on limited exam Musculoskeletal:  grossly normal tone BUE/BLE, good ROM, no bony abnormality Psychiatric:  grossly normal mood and affect (somewhat hostile at times but otherwise appropriate), speech fluent and appropriate, AOx3 Neurologic:  CN 2-12 grossly intact, moves all extremities in  coordinated fashion, sensation intact  Labs on Admission: I have personally reviewed following labs and imaging studies  CBC:  Recent Labs Lab 12/08/15 1652  WBC 7.8  NEUTROABS 6.0  HGB 13.4  HCT 39.7  MCV 99.5  PLT A999333*   Basic Metabolic Panel:  Recent Labs Lab 12/08/15 1652  NA 135  K 3.7  CL 103  CO2 21*  GLUCOSE 89  BUN 7  CREATININE 0.98  CALCIUM 8.2*   GFR: Estimated Creatinine Clearance: 92.7 mL/min (by C-G formula based on SCr of 0.98 mg/dL). Liver Function Tests:  Recent Labs Lab 12/08/15  1652  AST 63*  ALT 44  ALKPHOS 60  BILITOT 0.6  PROT 5.8*  ALBUMIN 3.6   No results for input(s): LIPASE, AMYLASE in the last 168 hours. No results for input(s): AMMONIA in the last 168 hours. Coagulation Profile: No results for input(s): INR, PROTIME in the last 168 hours. Cardiac Enzymes: No results for input(s): CKTOTAL, CKMB, CKMBINDEX, TROPONINI in the last 168 hours. BNP (last 3 results) No results for input(s): PROBNP in the last 8760 hours. HbA1C: No results for input(s): HGBA1C in the last 72 hours. CBG:  Recent Labs Lab 12/08/15 1645  GLUCAP 86   Lipid Profile: No results for input(s): CHOL, HDL, LDLCALC, TRIG, CHOLHDL, LDLDIRECT in the last 72 hours. Thyroid Function Tests: No results for input(s): TSH, T4TOTAL, FREET4, T3FREE, THYROIDAB in the last 72 hours. Anemia Panel: No results for input(s): VITAMINB12, FOLATE, FERRITIN, TIBC, IRON, RETICCTPCT in the last 72 hours. Urine analysis:    Component Value Date/Time   COLORURINE AMBER (A) 11/18/2015 1623   APPEARANCEUR CLEAR 11/18/2015 1623   LABSPEC 1.034 (H) 11/18/2015 1623   PHURINE 6.5 11/18/2015 1623   GLUCOSEU NEGATIVE 11/18/2015 1623   HGBUR TRACE (A) 11/18/2015 1623   BILIRUBINUR NEGATIVE 11/18/2015 1623   KETONESUR NEGATIVE 11/18/2015 1623   PROTEINUR 100 (A) 11/18/2015 1623   UROBILINOGEN 0.2 02/09/2014 1052   NITRITE NEGATIVE 11/18/2015 1623   LEUKOCYTESUR NEGATIVE  11/18/2015 1623    Creatinine Clearance: Estimated Creatinine Clearance: 92.7 mL/min (by C-G formula based on SCr of 0.98 mg/dL).  Sepsis Labs: @LABRCNTIP (procalcitonin:4,lacticidven:4) )No results found for this or any previous visit (from the past 240 hour(s)).   Radiological Exams on Admission: No results found.  EKG: Independently reviewed.  NSR with rate 87; no evidence of acute ischemia  Assessment/Plan Principal Problem:   Alcohol withdrawal seizure (HCC) Active Problems:   Nicotine abuse   EtOH dependence (HCC)   Alcohol withdrawal seizure -Patient with known ETOH dependence and h/o withdrawal who quit drinking 48 hours ago and had reported seizure activity with evidence of tongue biting -By all accounts, this appears to have been an alcohol withdrawal seizure -Patient denies this, but is also very concerned about losing his driving privileges -His wife reports that she was told he was exhibiting seizure activity while he was intubated for PNA as well (2016) -Will place in observation with the adult seizure admission order set -The ER called neurology, who suggested hospitalist admission; will continue neurology consult for consideration of EEG, as this 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  If he is not requiring medications by the CIWA scale, he may be appropriate for discharge in the next 12-48 hours. -He currently declines detox or formal alcohol rehab programs  Tobacco dependence -Encourage cessation.  This was discussed with  the patient and should be reviewed on an ongoing basis.   -Patch ordered at patient request.  DVT prophylaxis: Lovenox  Code Status: Full - confirmed with patient/family Family Communication: Wife and son present at bedside throughout evaluation Disposition Plan:  Home once clinically improved Consults called: Neurology (may need  to be called again in AM)  Admission status: It is my clinical opinion that referral for OBSERVATION is reasonable and necessary in this patient based on the above information provided. The aforementioned taken together are felt to place the patient at high risk for further clinical deterioration. However it is anticipated that the patient may be medically stable for discharge from the hospital within 24 to 48 hours.    Karmen Bongo MD [facility]  If 7PM-7AM, please contact night-coverage [email or web address] Password [email or web address]  12/08/2015, 9:03 PM

## 2015-12-08 NOTE — ED Notes (Signed)
Blankets placed on bed rails in place of seizure pads (not found)

## 2015-12-09 DIAGNOSIS — J439 Emphysema, unspecified: Secondary | ICD-10-CM | POA: Diagnosis present

## 2015-12-09 DIAGNOSIS — Z8701 Personal history of pneumonia (recurrent): Secondary | ICD-10-CM | POA: Diagnosis not present

## 2015-12-09 DIAGNOSIS — F10230 Alcohol dependence with withdrawal, uncomplicated: Secondary | ICD-10-CM | POA: Diagnosis not present

## 2015-12-09 DIAGNOSIS — F10239 Alcohol dependence with withdrawal, unspecified: Secondary | ICD-10-CM | POA: Diagnosis present

## 2015-12-09 DIAGNOSIS — R569 Unspecified convulsions: Secondary | ICD-10-CM | POA: Diagnosis present

## 2015-12-09 DIAGNOSIS — F17210 Nicotine dependence, cigarettes, uncomplicated: Secondary | ICD-10-CM | POA: Diagnosis present

## 2015-12-09 LAB — ETHANOL: Alcohol, Ethyl (B): <5 mg/dL (ref <5)

## 2015-12-09 NOTE — Progress Notes (Signed)
PROGRESS NOTE    Allen Wood  [id number] DOB: [birth year] DOA: 12/08/2015 PCP: Shirline Frees, MD     Brief Narrative:  Allen Wood is a 45 y.o. male with medical history significant of alcohol dependence with DTs but no seizure activity, severe PNA with septic shock requiring trach/PEG (now reversed), and COPD presenting following an episode at home that was concerning for seizure.  He reports that he was sitting at home and his wife told a funny story.  He started to laugh and his bottom jaw cramped and closed his throat where he felt like he couldn't breathe for a few seconds.  Family freaked out and called the ambulance, said he had a seizure.   By his wife and son's report: she was telling him the story.  He started to laugh and then his face froze with his mouth open, his eyes rolled back and he shook all over.  Son called 73, and she grabbed hold of the patient to quell his generalized convulsions and keep him safe.  She reports that he stopped breathing and she saw blood coming out of mouth (which she later realized was from where he bit his tongue).  He started to come to and then the same thing happened again.  When EMS arrived, he was still post-ictal.    Patient is an alcoholic - started in his 23s.  Longest period of sobriety 7-8 months last year (following his prolonged recovery from PNA).  Restarted last summer.   Longest period of sobirety since then is a few weeks.  Last drink was Friday night - bourbon, 1 pint.  Usually drinks a pint a day.  No h/o seizures.  +h/o shakes.  Patient denies DTs but wife reports that he has had them in the past.  Assessment & Plan:   Principal Problem:   Alcohol withdrawal seizure ([place]) Active Problems:   Nicotine abuse   EtOH dependence ([place])   Alcohol withdrawal seizure  -EtOH dependence, drinks 1 pint of bourbon nightly. Stopped alcohol abruptly on Friday 11/10 and had witnessed seizure 11/12.  -I do believe that his seizure  was provoked by alcohol withdrawal and he does not require EEG or antiepileptics. He is recommended to not drive for 6 months due to seizure episode. Spoke with Neurology today and they are in agreement. If his clinical status changes, I will formally consult neurology. Currently neuro exam is benign, has hand tremors consistent with alcohol withdrawal. Continue CIWA, neurochecks.   Alcohol and tobacco dependence -Cessation counseling provided -Nicotine patch ordered    DVT prophylaxis: lovenox Code Status: full Family Communication: no family at bedside Disposition Plan: continue CIWA protocol. Currently still exhibiting withdrawal symptoms. Hopeful discharge tomorrow.    Consultants:   None, spoke with neurology over the phone to discuss case   Procedures:   None  Antimicrobials:   None     Subjective: Patient has no specific complaints this morning; denies any fevers, chest pain, cough, shortness of breath, nausea, vomiting, diarrhea, abdominal pain, dysuria. Patient is tolerating meal. He is able to recall events prior to seizure episode; he tells me he was talking with his family and laughing when he had a seizure   Objective: Vitals:   12/08/15 2104 12/08/15 2109 12/08/15 2351 12/09/15 0532  BP:  118/81 117/80 119/79  Pulse:  (!) 102 86 67  Resp:  17 17 20   Temp:  98.6 F (37 C) 98.5 F (36.9 C) 97.9 F (36.6 C)  TempSrc:  Oral  Oral Oral  SpO2:  99% 98% 100%  Weight: 68.5 kg (151 lb 1.6 oz)     Height: 5\' 11"  (1.803 m)       Intake/Output Summary (Last 24 hours) at 12/09/15 0950 Last data filed at 12/09/15 0738  Gross per 24 hour  Intake             1000 ml  Output              400 ml  Net              600 ml   Filed Weights   12/08/15 1543 12/08/15 2104  Weight: 70.3 kg (155 lb) 68.5 kg (151 lb 1.6 oz)    Examination:  General exam: Appears calm and comfortable, tremors bilateral hands  Respiratory system: Clear to auscultation. Respiratory effort  normal. Cardiovascular system: S1 & S2 heard, RRR. No JVD, murmurs, rubs, gallops or clicks. No pedal edema. Gastrointestinal system: Abdomen is nondistended, soft and nontender. No organomegaly or masses felt. Normal bowel sounds heard. Central nervous system: Alert and oriented. No focal neurological deficits. Extremities: Symmetric 5 x 5 power. Skin: No rashes, lesions or ulcers Psychiatry: Judgement and insight appear normal. Mood & affect appropriate.   Data Reviewed: I have personally reviewed following labs and imaging studies  CBC:  Recent Labs Lab 12/08/15 1652  WBC 7.8  NEUTROABS 6.0  HGB 13.4  HCT 39.7  MCV 99.5  PLT A999333*   Basic Metabolic Panel:  Recent Labs Lab 12/08/15 1652  NA 135  K 3.7  CL 103  CO2 21*  GLUCOSE 89  BUN 7  CREATININE 0.98  CALCIUM 8.2*   GFR: Estimated Creatinine Clearance: 90.3 mL/min (by C-G formula based on SCr of 0.98 mg/dL). Liver Function Tests:  Recent Labs Lab 12/08/15 1652  AST 63*  ALT 44  ALKPHOS 60  BILITOT 0.6  PROT 5.8*  ALBUMIN 3.6   No results for input(s): LIPASE, AMYLASE in the last 168 hours. No results for input(s): AMMONIA in the last 168 hours. Coagulation Profile: No results for input(s): INR, PROTIME in the last 168 hours. Cardiac Enzymes: No results for input(s): CKTOTAL, CKMB, CKMBINDEX, TROPONINI in the last 168 hours. BNP (last 3 results) No results for input(s): PROBNP in the last 8760 hours. HbA1C: No results for input(s): HGBA1C in the last 72 hours. CBG:  Recent Labs Lab 12/08/15 1645  GLUCAP 86   Lipid Profile: No results for input(s): CHOL, HDL, LDLCALC, TRIG, CHOLHDL, LDLDIRECT in the last 72 hours. Thyroid Function Tests: No results for input(s): TSH, T4TOTAL, FREET4, T3FREE, THYROIDAB in the last 72 hours. Anemia Panel: No results for input(s): VITAMINB12, FOLATE, FERRITIN, TIBC, IRON, RETICCTPCT in the last 72 hours. Sepsis Labs: No results for input(s): PROCALCITON,  LATICACIDVEN in the last 168 hours.  No results found for this or any previous visit (from the past 240 hour(s)).     Radiology Studies: No results found.    Scheduled Meds: . enoxaparin (LOVENOX) injection  40 mg Subcutaneous Q24H  . folic acid  1 mg Oral Daily  . multivitamin with minerals  1 tablet Oral Daily  . nicotine  21 mg Transdermal Daily  . thiamine  100 mg Oral Daily   Or  . thiamine  100 mg Intravenous Daily   Continuous Infusions:    LOS: 0 days    Time spent: 40 minutes   Dessa Phi, DO [facility] [email or web address] Password [email or web address] 12/09/2015, 9:50 AM

## 2015-12-09 NOTE — Progress Notes (Signed)
NURSING PROGRESS NOTE  Allen Wood [id number] Admission Data: 12/09/2015 7:18 AM Attending Provider: Shon Millet* [id number], Allen Saxon, MD Code Status: Full  Allen Wood is a 45 y.o. male patient admitted from ED:  -No acute distress noted.  -No complaints of shortness of breath.  -No complaints of chest pain.   Cardiac Monitoring: Box # 24 in place. Cardiac monitor yields:normal sinus rhythm.  Blood pressure 118/81, pulse 102, temperature 98.6 F (37 C), temperature source Oral, resp. rate 17, height 5\' 11"  (1.803 m), weight 68.5 kg (151 lb 1.6 oz), SpO2 99 %.  IV Fluids:  IV in place, occlusive dsg intact without redness, IV cath wrist left, condition patent and no redness normal saline.   Allergies:  Patient has no known allergies.  Past Medical History:   has a past medical history of Alcohol abuse; Anxiety; COPD with emphysema ([place]); Delirium tremens ([place]); Depression; Pneumonia (2016); and Seizures ([place]).  Past Surgical History:   has a past surgical history that includes Mouth surgery; Tracheostomy (01/30/14); Esophagogastroduodenoscopy (N/A, 02/03/2014); and PEG placement (N/A, 02/03/2014).  Social History:   reports that he has been smoking Cigarettes.  He has a 20.00 pack-year smoking history. He has never used smokeless tobacco. He reports that he drinks alcohol. He reports that he does not use drugs.  Skin: intact  Patient/Family orientated to room. Information packet given to patient/family. Admission inpatient armband information verified with patient/family to include name and date of birth and placed on patient arm. Side rails up x 2, fall assessment and education completed with patient/family. Patient/family able to verbalize understanding of risk associated with falls and verbalized understanding to call for assistance before getting out of bed. Call light within reach. Patient/family able to voice and demonstrate understanding of unit orientation  instructions.

## 2015-12-10 MED ORDER — THIAMINE HCL 100 MG PO TABS: 100.0000 mg | ORAL_TABLET | Freq: Every day | ORAL | 0 refills | Status: AC

## 2015-12-10 MED ORDER — FOLIC ACID 1 MG PO TABS: 1.0000 mg | ORAL_TABLET | Freq: Every day | ORAL | 0 refills | Status: AC

## 2015-12-10 NOTE — Discharge Instructions (Signed)
Alcohol Withdrawal Alcohol withdrawal is a group of symptoms that can develop when a person who drinks heavily and regularly stops drinking or drinks less. What are the causes? Heavy and regular drinking can cause chemicals that send signals from the brain to the body (neurotransmitters) to deactivate. Alcohol withdrawal develops when deactivated neurotransmitters reactivate because a person stops drinking or drinks less. What increases the risk? The more a person drinks and the longer he or she drinks, the greater the risk of alcohol withdrawal. Severe withdrawal is more likely to develop in someone who:  Had severe alcohol withdrawal in the past.  Had a seizure during a previous episode of alcohol withdrawal.  Is elderly.  Is pregnant.  Has been abusing drugs.  Has other medical problems, including:  Infection.  Heart, lung, or liver disease.  Seizures.  Mental health problems. What are the signs or symptoms? Symptoms of this condition can be mild to moderate, or they can be severe. Mild to moderate symptoms may include:  Fatigue.  Nightmares.  Trouble sleeping.  Depression.  Anxiety.  Inability to think clearly.  Mood swings.  Irritability.  Loss of appetite.  Nausea or vomiting.  Clammy skin.  Extreme sweating.  Rapid heartbeat.  Shakiness.  Uncontrollable shaking (tremor). Severe symptoms may include:  Fever.  Seizures.  Severeconfusion.  Feeling or seeing things that are not there (hallucinations). Symptoms usually begin within eight hours after a person stops drinking or drinks less. They can last for weeks. How is this diagnosed? Alcohol withdrawal is diagnosed with a medical history and physical exam. Sometimes, urine and blood tests are also done. How is this treated? Treatment may involve:  Monitoring blood pressure, pulse, and breathing.  Getting fluids through an IV tube.  Medicine to reduce anxiety.  Medicine to prevent or  control seizures.  Multivitamins and B vitamins.  Having a health care provider check on you daily. If symptoms are moderate to severe or if there is a risk of severe withdrawal, treatment may be done at a hospital or treatment center. Follow these instructions at home:  Take medicines and vitamin supplements only as directed by your health care provider.  Do not drink alcohol.  Have someone stay with you or be available if you need help.  Drink enough fluid to keep your urine clear or pale yellow.  Consider joining a 12-step program or another alcohol support group. Contact a health care provider if:  Your symptoms get worse or do not go away.  You cannot keep food or water in your stomach.  You are struggling with not drinking alcohol.  You cannot stop drinking alcohol. Get help right away if:  You have an irregular heartbeat.  You have chest pain.  You have trouble breathing.  You have symptoms of severe withdrawal, such as:  A fever.  Seizures.  Severe confusion.  Hallucinations. This information is not intended to replace advice given to you by your health care provider. Make sure you discuss any questions you have with your health care provider. Document Released: 10/22/2004 Document Revised: 05/22/2015 Document Reviewed: 10/31/2013 Elsevier Interactive Patient Education  2017 [employer].   Delirium Tremens Introduction Delirium tremens, also known as DTs, is the most severe form of alcohol withdrawal. Alcohol withdrawal is a group of mental and physical symptoms that can develop when a person who drinks heavily and regularly stops drinking or drinks less than usual. Delirium tremens is a serious condition and can be life-threatening. It must be treated in  a hospital or a treatment center. What are the causes? Heavy and regular drinking can cause chemicals that send signals from the brain to the body (neurotransmitters) to deactivate. Alcohol withdrawal,  including DTs, develops when deactivated neurotransmitters reactivate because a person stops drinking or drinks less than usual. What increases the risk? The more a person drinks and the longer he or she drinks, the greater the risk of DTs. This condition is more likely to develop in:  People who have had severe alcohol withdrawal or DTs in the past.  People who have had a seizure during a previous episode of alcohol withdrawal.  Elderly people.  Pregnant women.  People who use illegal drugs.  People who take medicines to help them relax (sedatives).  People who have certain medical problems, including:  Infection.  Heart, lung, or liver disease.  History of seizures.  Mental health problems. What are the signs or symptoms? Early symptoms of alcohol withdrawal happen before symptoms of DTs. These symptoms may start within 6 hours after the most recent drink, and they may last for 5-7 days. Early symptoms of alcohol withdrawal may include:  Difficulty sleeping.  Anxiety.  Difficulty thinking clearly.  Irritability.  Fast heart rate.  Fast breathing.  Headache.  Nausea and vomiting.  Fever.  Excessive sweating.  A heartbeat that feels irregular or faster than normal (palpitations).  Involuntary shaking or trembling (tremor) of a body part. Symptoms of DTs may begin after early withdrawal symptoms. These symptoms may start to develop 3 days after the most recent drink. They may last for up to 8 days. Symptoms of DTs may include:  Changes in attention and awareness.  Sudden changes in heart rate and rhythm, body temperature, and blood pressure (vital signs).  Feeling or seeing things that other people do not feel or see (hallucinations).  Extreme sleepiness (stupor).  Extreme confusion.  Seizures. How is this diagnosed? This condition may be diagnosed based on:  Medical history.  History of alcohol consumption.  Symptoms.  Blood tests.  Urine  tests.  A physical exam. The health care provider may check for:  Fever.  High blood pressure.  Rapid heart rate.  Overactive reflexes.  Tremors. How is this treated? Treatment for this condition is done in a hospital or treatment center. It may include:  Monitoring your vital signs.  Receiving fluids and minerals (electrolytes) through an IV tube.  Sedatives.  Medicines that reduce anxiety.  Medicines to control your blood pressure.  Medicines to prevent or control seizures.  Multivitamins and B vitamins.  Having ongoing counseling and mental health support if you have a history of alcohol abuse or if you used an illegal drug. Follow these instructions at home:   Do not drink alcohol.  Take over-the-counter and prescription medicines only as told by your health care provider.  Do not drive or operate heavy machinery until your health care provider approves.  Drink enough fluid to keep your urine clear or pale yellow.  Have someone stay with you or be available to you in case you need help.  Consider joining a 12-step program or another alcohol support group.  Keep all follow-up visits as told by your health care provider. This is important. Contact a health care provider if:  You have symptoms that get worse or do not go away.  You cannot eat or drink without vomiting.  You are struggling with not drinking alcohol.  You cannot stop drinking alcohol. Get help right away if:  You  have a seizure.  You have a fever along with other symptoms of alcohol withdrawal.  You become extremely confused.  You have hallucinations.  You become extremely sleepy.  You vomit blood.  You have extreme tremors or sweating.  You have palpitations.  You have chest pain.  You have difficulty breathing. These symptoms may represent a serious problem that is an emergency. Do not wait to see if the symptoms will go away. Get medical help right away. Call your local  emergency services (911 in the [place] Do not drive yourself to the hospital.  This information is not intended to replace advice given to you by your health care provider. Make sure you discuss any questions you have with your health care provider. Document Released: 01/12/2005 Document Revised: 06/20/2015 Document Reviewed: 07/04/2014  2017 Elsevier  Seizure, Adult A seizure is a sudden burst of abnormal electrical activity in the brain. The abnormal activity temporarily interrupts normal brain function, causing a person to experience any of the following:  Involuntary movements.  Changes in awareness or consciousness.  Uncontrollable shaking (convulsions). Seizures usually last from 30 seconds to 2 minutes. They usually do not cause permanent brain damage unless they are prolonged. What can cause a seizure to happen? Seizures can happen for many reasons including:  A fever.  Low blood sugar.  A medicine.  An illnesses.  A brain injury. Some people who have a seizure never have another one. People who have repeated seizures have a condition called epilepsy. What are the symptoms of a seizure? Symptoms of a seizure vary greatly from person to person. They include:  Convulsions.  Stiffening of the body.  Involuntary movements of the arms or legs.  Loss of consciousness.  Breathing problems.  Falling suddenly.  Confusion.  Head nodding.  Eye blinking or fluttering.  Lip smacking.  Drooling.  Rapid eye movements.  Grunting.  Loss of bladder control and bowel control.  Staring.  Unresponsiveness. Some people have symptoms right before a seizure happens (aura) and right after a seizure happens. Symptoms of an aura include:  Fear or anxiety.  Nausea.  Feeling like the room is spinning (vertigo).  A feeling of having seen or heard something before (deja vu).  Odd tastes or smells.  Changes in vision, such as seeing flashing lights or spots. Symptoms  that may follow a seizure include:  Confusion.  Sleepiness.  Headache.  Weakness of one side of the body. Follow these instructions at home: Medicines  Take over-the-counter and prescription medicines only as told by your health care provider.  Avoid any substances that may prevent your medicine from working properly, such as alcohol. Activity  Do not drive, swim, or do any other activities that would be dangerous if you had another seizure. Wait until your health care provider approves.  If you live in the [place] check with your local DMV (department of motor vehicles) to find out about the local driving laws. Each state has specific rules about when you can legally return to driving.  Get enough rest. Lack of sleep can make seizures more likely to occur. Educating others Teach friends and family what to do if you have a seizure. They should:  Lay you on the ground to prevent a fall.  Cushion your head and body.  Loosen any tight clothing around your neck.  Turn you on your side. If vomiting occurs, this helps keep your airway clear.  Stay with you until you recover.  Not hold you down.  Holding you down will not stop the seizure.  Not put anything in your mouth.  Know whether or not you need emergency care. General instructions  Contact your health care provider each time you have a seizure.  Avoid anything that has ever triggered a seizure for you.  Keep a seizure diary. Record what you remember about each seizure, especially anything that might have triggered the seizure.  Keep all follow-up visits as told by your health care provider. This is important. Contact a health care provider if:  You have another seizure.  You have seizures more often.  Your seizure symptoms change.  You continue to have seizures with treatment.  You have symptoms of an infection or illness. They might increase your risk of having a seizure. Get help right away if:  You have a  seizure:  That lasts longer than 5 minutes.  That is different than previous seizures.  That leaves you unable to speak or use a part of your body.  That makes it harder to breathe.  After a head injury.  You have:  Multiple seizures in a row.  Confusion or a severe headache right after a seizure.  You are having seizures more often.  You do not wake up immediately after a seizure.  You injure yourself during a seizure. These symptoms may represent a serious problem that is an emergency. Do not wait to see if the symptoms will go away. Get medical help right away. Call your local emergency services (911 in the [place] Do not drive yourself to the hospital.  This information is not intended to replace advice given to you by your health care provider. Make sure you discuss any questions you have with your health care provider. Document Released: 01/10/2000 Document Revised: 09/08/2015 Document Reviewed: 08/16/2015 Elsevier Interactive Patient Education  2017 [employer].

## 2015-12-10 NOTE — Progress Notes (Signed)
Discharge instructions, Rx's and follow up appts explained and provided to patient and wife verbalized understanding. Patient left floor via wheelchair accompanied by staff. No c/o pain or shortness of breath at discharge.  Naiah Donahoe, Tivis Ringer, RN

## 2015-12-10 NOTE — Discharge Summary (Signed)
Physician Discharge Summary  Allen Wood [id number] DOB: [birth year] DOA: 12/08/2015  PCP: Shirline Frees, MD  Admit date: 12/08/2015 Discharge date: 12/10/2015  Admitted From: Home Disposition:  Home  Recommendations for Outpatient Follow-up:  1. Follow up with PCP in 1-2 weeks  Home Health: No  Equipment/Devices: None   Discharge Condition: Stable CODE STATUS: Full  Diet recommendation: general  Brief/Interim Summary: From H&P: Allen Wood a 45 y.o.malewith medical history significant of alcohol dependence with DTs but no seizure activity, severe PNA with septic shock requiring trach/PEG (now reversed), and COPD presenting following an episode at home that was concerning for seizure. He reports that he was sitting at home and his wife told a funny story. He started to laugh and his bottom jaw cramped and closed his throat where he felt like he couldn't breathe for a few seconds. Family freaked out and called the ambulance, said he had a seizure.   By his wife and son's report: she was telling himthe story. He started to laugh and then his face froze with his mouth open, his eyes rolled back and he shook all over. Son called 45, and she grabbed hold of the patient to quell his generalized convulsions and keep him safe. She reports that he stopped breathing and she saw blood coming out of mouth (which she later realized was from where he bit his tongue). He started to come to and then the same thing happened again. When EMS arrived, he was still post-ictal.   Patient is an alcoholic - started in his 77s. Longest period of sobriety 7-8 months last year (following his prolonged recovery from PNA). Restarted last summer. Longest period of sobirety since then is a few weeks. Last drink was Friday night - bourbon, 1 pint. Usually drinks a pint a day. No h/o seizures. +h/o shakes. Patient denies DTs but wife reports that he has had them in the  past.  Interim: During hospitalization, he remained on CIWA protocol and remained seizure free. Case was discussed with neurology over the phone. His presentation is most likely consistent with alcohol withdrawal seizure. He is counseled on quitting all EtOH abuse and to avoid driving for 6 months.   Subjective on day of discharge: Feeling well. No complaints. No CP SOB N/V.   Discharge Diagnoses:  Principal Problem:   Alcohol withdrawal seizure ([place]) Active Problems:   Nicotine abuse   EtOH dependence ([place])   Alcohol withdrawal seizure  -EtOH dependence, drinks 1 pint of bourbon nightly. Stopped alcohol abruptly on Friday 11/10 and had witnessed seizure 11/12.  -I do believe that his seizure was provoked by alcohol withdrawal and he does not require EEG or antiepileptics. He is recommended to not drive for 6 months due to seizure episode. Spoke with Neurology and they are in agreement.  Alcohol and tobacco dependence -Cessation counseling provided. He stated that he has support at home and plans to follow with AA -Nicotine patch    Discharge Instructions  Discharge Instructions    Diet - low sodium heart healthy    Complete by:  As directed    Driving Restrictions    Complete by:  As directed    You cannot drive for 6 months following a seizure   Increase activity slowly    Complete by:  As directed        Medication List    TAKE these medications   albuterol 108 (90 Base) MCG/ACT inhaler Commonly known as:  PROVENTIL HFA;VENTOLIN HFA Inhale 2 puffs  into the lungs every 6 (six) hours as needed for wheezing or shortness of breath.   folic acid 1 MG tablet Commonly known as:  FOLVITE Take 1 tablet (1 mg total) by mouth daily. Start taking on:  12/11/2015   thiamine 100 MG tablet Take 1 tablet (100 mg total) by mouth daily. Start taking on:  12/11/2015      Follow-up Information    Shirline Frees, MD. Schedule an appointment as soon as possible for a visit in 1  week(s).   Specialty:  Family Medicine Contact information: [place] [address] [phone]          No Known Allergies  Consultations:  None, neurology over the phone   Procedures/Studies: Dg Chest 2 View  Result Date: 11/18/2015 CLINICAL DATA:  Shortness of breath EXAM: CHEST  2 VIEW COMPARISON:  07/04/2015 chest radiograph. FINDINGS: Stable cardiomediastinal silhouette with normal heart size. No pneumothorax. No pleural effusion. Hyperinflated lungs. No pulmonary edema. No acute consolidative airspace disease. IMPRESSION: Hyperinflated lungs, suggesting COPD. No acute cardiopulmonary disease. Electronically Signed   By: Ilona Sorrel M.D.   On: 11/18/2015 17:32       Discharge Exam: Vitals:   12/10/15 0854 12/10/15 1100  BP: 120/77 113/75  Pulse: 99 82  Resp: 18 16  Temp: 98.1 F (36.7 C) 98.2 F (36.8 C)   Vitals:   12/09/15 2130 12/10/15 0552 12/10/15 0854 12/10/15 1100  BP: 107/74 125/82 120/77 113/75  Pulse: 75 75 99 82  Resp: 18 20 18 16   Temp: 98.3 F (36.8 C) 98 F (36.7 C) 98.1 F (36.7 C) 98.2 F (36.8 C)  TempSrc: Oral Oral Oral Oral  SpO2: 99% 100% 96% 99%  Weight:      Height:        General: Pt is alert, awake, not in acute distress Cardiovascular: RRR, S1/S2 +, no rubs, no gallops Respiratory: CTA bilaterally, no wheezing, no rhonchi Abdominal: Soft, NT, ND, bowel sounds + Extremities: no edema, no cyanosis    The results of significant diagnostics from this hospitalization (including imaging, microbiology, ancillary and laboratory) are listed below for reference.     Microbiology: No results found for this or any previous visit (from the past 240 hour(s)).   Labs: BNP (last 3 results) No results for input(s): BNP in the last 8760 hours. Basic Metabolic Panel:  Recent Labs Lab 12/08/15 1652  NA 135  K 3.7  CL 103  CO2 21*  GLUCOSE 89  BUN 7  CREATININE 0.98  CALCIUM 8.2*   Liver  Function Tests:  Recent Labs Lab 12/08/15 1652  AST 63*  ALT 44  ALKPHOS 60  BILITOT 0.6  PROT 5.8*  ALBUMIN 3.6   No results for input(s): LIPASE, AMYLASE in the last 168 hours. No results for input(s): AMMONIA in the last 168 hours. CBC:  Recent Labs Lab 12/08/15 1652  WBC 7.8  NEUTROABS 6.0  HGB 13.4  HCT 39.7  MCV 99.5  PLT 143*   Cardiac Enzymes: No results for input(s): CKTOTAL, CKMB, CKMBINDEX, TROPONINI in the last 168 hours. BNP: Invalid input(s): POCBNP CBG:  Recent Labs Lab 12/08/15 1645  GLUCAP 86   D-Dimer No results for input(s): DDIMER in the last 72 hours. Hgb A1c No results for input(s): HGBA1C in the last 72 hours. Lipid Profile No results for input(s): CHOL, HDL, LDLCALC, TRIG, CHOLHDL, LDLDIRECT in the last 72 hours. Thyroid function studies No results for input(s): TSH, T4TOTAL, T3FREE,  THYROIDAB in the last 72 hours.  Invalid input(s): FREET3 Anemia work up No results for input(s): VITAMINB12, FOLATE, FERRITIN, TIBC, IRON, RETICCTPCT in the last 72 hours. Urinalysis    Component Value Date/Time   COLORURINE AMBER (A) 11/18/2015 1623   APPEARANCEUR CLEAR 11/18/2015 1623   LABSPEC 1.034 (H) 11/18/2015 1623   PHURINE 6.5 11/18/2015 1623   GLUCOSEU NEGATIVE 11/18/2015 1623   HGBUR TRACE (A) 11/18/2015 1623   BILIRUBINUR NEGATIVE 11/18/2015 1623   KETONESUR NEGATIVE 11/18/2015 1623   PROTEINUR 100 (A) 11/18/2015 1623   UROBILINOGEN 0.2 02/09/2014 1052   NITRITE NEGATIVE 11/18/2015 1623   LEUKOCYTESUR NEGATIVE 11/18/2015 1623   Sepsis Labs Invalid input(s): PROCALCITONIN,  WBC,  LACTICIDVEN Microbiology No results found for this or any previous visit (from the past 240 hour(s)).   Time coordinating discharge: Over 30 minutes  SIGNED:  Dessa Phi, DO [facility] Pager [phone]  If 7PM-7AM, please contact night-coverage [email or web address] Password [facility] 12/10/2015, 12:39 PM

## 2015-12-10 NOTE — Care Management Note (Signed)
Case Management Note  Patient Details  Name: Allen Wood MRN: [id number] Date of Birth: 04-01-1970  Subjective/Objective:    Pt with? seizure activity, history significant of alcohol dependence/ DTs but no seizure activity, severe PNA with septic shock requiring trach/PEG (now reversed), and COPD. From home with wife. Independent with ADL's, no DME usage.  PCP: [facility] (Spouse)     [phone]        Action/Plan: Plan is to discharge to home today.   Expected Discharge Date:    12/10/2015              Expected Discharge Plan:  Home/Self Care  In-House Referral:  Clinical Social Work  Discharge planning Services  CM Consult  Post Acute Care Choice:    Choice offered to:     DME Arranged:    DME Agency:     HH Arranged:    HH Agency:     Status of Service:  Completed, signed off  If discussed at [employer], dates discussed:    Additional Comments:  Sharin Mons, RN 12/10/2015, 9:48 AM

## 2016-02-28 ENCOUNTER — Emergency Department (HOSPITAL_COMMUNITY)
Admission: EM | Admit: 2016-02-28 | Discharge: 2016-02-28 | Disposition: A | Discharge status: Home / Self Care | Payer: Medicaid Other | Attending: Dermatology | Admitting: Dermatology

## 2016-02-28 ENCOUNTER — Encounter (HOSPITAL_COMMUNITY): Payer: Self-pay | Admitting: *Deleted

## 2016-02-28 DIAGNOSIS — Z5321 Procedure and treatment not carried out due to patient leaving prior to being seen by health care provider: Secondary | ICD-10-CM | POA: Diagnosis not present

## 2016-02-28 DIAGNOSIS — F17210 Nicotine dependence, cigarettes, uncomplicated: Secondary | ICD-10-CM | POA: Diagnosis not present

## 2016-02-28 DIAGNOSIS — J449 Chronic obstructive pulmonary disease, unspecified: Secondary | ICD-10-CM | POA: Diagnosis not present

## 2016-02-28 DIAGNOSIS — M25512 Pain in left shoulder: Secondary | ICD-10-CM | POA: Insufficient documentation

## 2016-02-28 NOTE — ED Triage Notes (Signed)
Pt arrived by [facility] for a fall one week ago and still having left shoulder pain.

## 2016-02-28 NOTE — ED Notes (Signed)
Pt called with no answer

## 2016-02-28 NOTE — ED Notes (Signed)
Pt called for room with no answer x 3

## 2016-02-28 NOTE — ED Notes (Signed)
Called with no response

## 2022-01-21 DIAGNOSIS — J441 Chronic obstructive pulmonary disease with (acute) exacerbation: Secondary | ICD-10-CM | POA: Diagnosis not present

## 2022-01-26 HISTORY — PX: CYSTECTOMY: SUR359

## 2022-02-23 DIAGNOSIS — R509 Fever, unspecified: Secondary | ICD-10-CM | POA: Diagnosis not present

## 2022-02-23 DIAGNOSIS — R059 Cough, unspecified: Secondary | ICD-10-CM | POA: Diagnosis not present

## 2022-02-23 DIAGNOSIS — U071 COVID-19: Secondary | ICD-10-CM | POA: Diagnosis not present

## 2022-02-23 DIAGNOSIS — J069 Acute upper respiratory infection, unspecified: Secondary | ICD-10-CM | POA: Diagnosis not present

## 2022-02-23 DIAGNOSIS — J441 Chronic obstructive pulmonary disease with (acute) exacerbation: Secondary | ICD-10-CM | POA: Diagnosis not present

## 2022-02-23 DIAGNOSIS — R5383 Other fatigue: Secondary | ICD-10-CM | POA: Diagnosis not present

## 2022-04-30 ENCOUNTER — Other Ambulatory Visit: Payer: Self-pay | Admitting: Family Medicine

## 2022-04-30 ENCOUNTER — Ambulatory Visit
Admission: RE | Admit: 2022-04-30 | Discharge: 2022-04-30 | Disposition: A | Discharge status: Home / Self Care | Payer: Medicaid Other | Source: Ambulatory Visit | Attending: Family Medicine | Admitting: Family Medicine

## 2022-04-30 DIAGNOSIS — J40 Bronchitis, not specified as acute or chronic: Secondary | ICD-10-CM | POA: Diagnosis not present

## 2022-04-30 DIAGNOSIS — R030 Elevated blood-pressure reading, without diagnosis of hypertension: Secondary | ICD-10-CM | POA: Diagnosis not present

## 2022-05-24 DIAGNOSIS — D235 Other benign neoplasm of skin of trunk: Secondary | ICD-10-CM | POA: Diagnosis not present

## 2022-05-29 DIAGNOSIS — D235 Other benign neoplasm of skin of trunk: Secondary | ICD-10-CM | POA: Diagnosis not present

## 2022-09-18 DIAGNOSIS — Z0000 Encounter for general adult medical examination without abnormal findings: Secondary | ICD-10-CM | POA: Diagnosis not present

## 2022-09-18 DIAGNOSIS — F17200 Nicotine dependence, unspecified, uncomplicated: Secondary | ICD-10-CM | POA: Diagnosis not present

## 2022-09-18 DIAGNOSIS — Z13220 Encounter for screening for lipoid disorders: Secondary | ICD-10-CM | POA: Diagnosis not present

## 2022-09-18 DIAGNOSIS — R030 Elevated blood-pressure reading, without diagnosis of hypertension: Secondary | ICD-10-CM | POA: Diagnosis not present

## 2022-09-18 DIAGNOSIS — Z1211 Encounter for screening for malignant neoplasm of colon: Secondary | ICD-10-CM | POA: Diagnosis not present

## 2022-09-18 DIAGNOSIS — J301 Allergic rhinitis due to pollen: Secondary | ICD-10-CM | POA: Diagnosis not present

## 2022-09-18 DIAGNOSIS — Z125 Encounter for screening for malignant neoplasm of prostate: Secondary | ICD-10-CM | POA: Diagnosis not present

## 2022-09-18 DIAGNOSIS — J410 Simple chronic bronchitis: Secondary | ICD-10-CM | POA: Diagnosis not present

## 2022-09-21 ENCOUNTER — Other Ambulatory Visit: Payer: Self-pay | Admitting: Family Medicine

## 2022-09-21 DIAGNOSIS — Z122 Encounter for screening for malignant neoplasm of respiratory organs: Secondary | ICD-10-CM

## 2022-10-06 ENCOUNTER — Encounter: Payer: Self-pay | Admitting: Family Medicine

## 2022-10-14 ENCOUNTER — Ambulatory Visit
Admission: RE | Admit: 2022-10-14 | Discharge: 2022-10-14 | Disposition: A | Discharge status: Home / Self Care | Payer: Medicaid Other | Source: Ambulatory Visit | Attending: Family Medicine | Admitting: Family Medicine

## 2022-10-14 DIAGNOSIS — F17210 Nicotine dependence, cigarettes, uncomplicated: Secondary | ICD-10-CM | POA: Diagnosis not present

## 2022-10-14 DIAGNOSIS — Z122 Encounter for screening for malignant neoplasm of respiratory organs: Secondary | ICD-10-CM

## 2022-10-16 DIAGNOSIS — L723 Sebaceous cyst: Secondary | ICD-10-CM | POA: Diagnosis not present

## 2022-10-29 DIAGNOSIS — Z23 Encounter for immunization: Secondary | ICD-10-CM | POA: Diagnosis not present

## 2022-11-26 DIAGNOSIS — D649 Anemia, unspecified: Secondary | ICD-10-CM | POA: Diagnosis not present

## 2022-11-30 NOTE — Progress Notes (Unsigned)
Cardiology Office Note:  .   Date:  12/02/2022 ID:  Allen Wood, DOB 05-07-1970, MRN [id number] PCP: Noberto Retort, MD [facility] Providers Cardiologist:  None   Patient Profile: .      PMH Elevated liver enzymes Anxiety Etoh dependence Tobacco use disorder COPD Coronary artery calcification on CT       History of Present Illness: .   Allen Wood is a very pleasant 52 y.o. male  who is here today for new patient consult for coronary artery calcification on lung cancer screening CT. He remains active as a self-employed Oceanographer. He denies chest pain, palpitations, or syncope. He reports occasional shortness of breath, which he attributes to his COPD. No edema, orthopnea, or PND. He also mentions experiencing cramps in his neck, the cause of which is unclear. He has a history of sleep apnea and is currently trying to quit smoking. He has been taking over-the-counter iron supplements due to a low iron level detected on a previous blood test. His blood pressure has been elevated, and his primary care doctor has suggested starting a blood pressure medication. He has never been on cholesterol medication. His wife tells him he snores and he admits to daytime somnolence.   Family history: His family history includes Cancer in his father and mother.   ASCVD Risk Score:  RESULT SUMMARY: 3.1 % Risk of cardiovascular event (coronary or stroke death or non-fatal MI or stroke) in next 10 years. No statin recommended because 10-year risk <5%; always encourage healthy cardiovascular lifestyle choices. Some patients with other high risk features may still be appropriate for treatment.  INPUTS: History of ASCVD --> 0 = No LDL Cholesterol >=190mg /dL (4.09 mmol/L) --> 0 = No Age --> 52 years Diabetes --> 0 = No Sex --> 1 = Male Total Cholesterol --> 158 mg/dL HDL Cholesterol --> 811 mg/dL Systolic Blood Pressure --> 120 mm Hg Treatment for Hypertension -->  0 = No Smoker --> 1 = Yes Race --> 1 = White  Diet:  Activity:  ROS: See HPI       Studies Reviewed: Marland Kitchen    Lung Cancer Screening CT     10/14/22  IMPRESSION: 1. Lung-RADS 2, benign appearance or behavior. Continue annual screening with low-dose chest CT without contrast in 12 months. 2. Mild coronary artery calcifications, recommend ASCVD risk assessment. 3. Aortic Atherosclerosis (ICD10-I70.0) and Emphysema (ICD10-J43.9).    EKG Interpretation Date/Time:  Wednesday December 02 2022 10:42:57 EST Ventricular Rate:  85 PR Interval:  122 QRS Duration:  78 QT Interval:  374 QTC Calculation: 445 R Axis:   76  Text Interpretation:  Poor data quality, interpretation may be adversely affected Normal sinus rhythm Nonspecific ST abnormality Confirmed by Eligha Bridegroom [phone]) on 12/02/2022 1:20:44 PM  Risk Assessment/Calculations:     HYPERTENSION CONTROL Vitals:   12/02/22 0959 12/02/22 1026  BP: (!) 140/90 (!) 144/90    The patient's blood pressure is elevated above target today.  In order to address the patient's elevated BP: A new medication was prescribed today.          Physical Exam:   VS: BP (!) 144/90   Pulse 94   Ht 5\' 11"  (1.803 m)   Wt 155 lb 9.6 oz (70.6 kg)   SpO2 94%   BMI 21.70 kg/m   Wt Readings from Last 3 Encounters:  12/02/22 155 lb 9.6 oz (70.6 kg)  12/08/15 151 lb 1.6 oz (68.5 kg)  11/18/15 150 lb (68 kg)  GEN: Well nourished, well developed in no acute distress NECK: No JVD; No carotid bruits CARDIAC: RRR, no murmurs, rubs, gallops RESPIRATORY:  Clear to auscultation without rales, wheezing or rhonchi  ABDOMEN: Soft, non-tender, non-distended EXTREMITIES:  No edema; No deformity     ASSESSMENT AND PLAN: .    Coronary artery calcification/Aortic atherosclerosis: Mild coronary artery calcification and aortic atherosclerosis noted on CT chest lung cancer screening. No history of prior cardiology evaluation.  He experiences shortness  of breath at times with exertion.  Also has history of COPD/emphysema.  We will get coronary CTA for mapping of coronary anatomy and to evaluate for ischemia.  Will have him take metoprolol tartrate 100 mg 2 hours prior to test.  Hypertension: BP is elevated and remains so on my recheck.  He was recently encouraged to start antihypertensive therapy by PCP.  We will start amlodipine 5 mg once daily.  His wife is a CMA and she will monitor home BP for goal < 130/80.  We will recheck renal function today for upcoming CT.  I will see him back in 8-12 weeks for follow-up.   Snoring: His wife reports that he snores and he admits to daytime somnolence. We did not specifically discuss sleep testing due to the length of the visit with focus to address CAD.  Plan to get STOP-BANG score and discuss sleep testing at next visit.  Anemia: He reported low hemoglobin on recent blood testing with PCP for which he started himself on iron supplementation. I am unable to see his lab results. He asks about continuing the iron supplements.  I encouraged further discussion with PCP and consideration of iron panel for further evaluation of anemia.  CV Risk Assessment: ASCVD risk score 3.1%.  We discussed the individual components including hypertension and history of tobacco abuse. He does not have a history of hyperlipidemia.   Tobacco abuse: Current smoker, using nicotine patches intermittently. Feels motivated to quit. Strongly encourage cessation of smoking for overall health improvement.  Plan/Goals: Aim for 150 minutes of moderate intensity exercise each week along with heart healthy diet consisting mostly of fruits, vegetables, whole grains, and lean protein and avoiding saturated fat, processed foods, simple carbohydrates, and sugar.       Dispo: 3 months with me  Signed, Eligha Bridegroom, NP-C

## 2022-12-02 ENCOUNTER — Encounter (HOSPITAL_BASED_OUTPATIENT_CLINIC_OR_DEPARTMENT_OTHER): Payer: Self-pay | Admitting: Nurse Practitioner

## 2022-12-02 ENCOUNTER — Ambulatory Visit (HOSPITAL_BASED_OUTPATIENT_CLINIC_OR_DEPARTMENT_OTHER): Payer: Medicaid Other | Admitting: Nurse Practitioner

## 2022-12-02 VITALS — BP 144/90 | HR 94 | Ht 71.0 in | Wt 155.6 lb

## 2022-12-02 DIAGNOSIS — R0683 Snoring: Secondary | ICD-10-CM | POA: Diagnosis not present

## 2022-12-02 DIAGNOSIS — Z720 Tobacco use: Secondary | ICD-10-CM | POA: Diagnosis not present

## 2022-12-02 DIAGNOSIS — E785 Hyperlipidemia, unspecified: Secondary | ICD-10-CM | POA: Diagnosis not present

## 2022-12-02 DIAGNOSIS — I2510 Atherosclerotic heart disease of native coronary artery without angina pectoris: Secondary | ICD-10-CM

## 2022-12-02 DIAGNOSIS — I10 Essential (primary) hypertension: Secondary | ICD-10-CM | POA: Diagnosis not present

## 2022-12-02 DIAGNOSIS — D649 Anemia, unspecified: Secondary | ICD-10-CM | POA: Diagnosis not present

## 2022-12-02 DIAGNOSIS — Z7189 Other specified counseling: Secondary | ICD-10-CM

## 2022-12-02 LAB — BASIC METABOLIC PANEL
BUN/Creatinine Ratio: 8 — ABNORMAL LOW (ref 9–20)
BUN: 5 mg/dL — ABNORMAL LOW (ref 6–24)
CO2: 21 mmol/L (ref 20–29)
Calcium: 8.8 mg/dL (ref 8.7–10.2)
Chloride: 87 mmol/L — ABNORMAL LOW (ref 96–106)
Creatinine, Ser: 0.6 mg/dL — ABNORMAL LOW (ref 0.76–1.27)
Glucose: 72 mg/dL (ref 70–99)
Potassium: 4.5 mmol/L (ref 3.5–5.2)
Sodium: 123 mmol/L — ABNORMAL LOW (ref 134–144)
eGFR: 115 mL/min/{1.73_m2} (ref >59)

## 2022-12-02 MED ORDER — METOPROLOL TARTRATE 100 MG PO TABS: ORAL_TABLET | ORAL | 0 refills | Status: DC

## 2022-12-02 MED ORDER — AMLODIPINE BESYLATE 5 MG PO TABS: 5.0000 mg | ORAL_TABLET | Freq: Every day | ORAL | 3 refills | Status: DC

## 2022-12-02 NOTE — Patient Instructions (Signed)
Medication Instructions:   START Amlodipine one (1) tablet by mouth ( 5 mg ) daily.   *If you need a refill on your cardiac medications before your next appointment, please call your pharmacy*   Lab Work:  TODAY!!! BMET  If you have labs (blood work) drawn today and your tests are completely normal, you will receive your results only by: MyChart Message (if you have MyChart) OR A paper copy in the mail If you have any lab test that is abnormal or we need to change your treatment, we will call you to review the results.   Testing/Procedures:    Your cardiac CT will be scheduled at one of the below locations:   [facility] [address] [place], [place] [address] [phone]  If scheduled at [facility], please arrive at the [facility] Entrance (Entrance C2) of [facility] 30 minutes prior to test start time. You can use the FREE valet parking offered at entrance C (encouraged to control the heart rate for the test)  Proceed to the [facility] Radiology Department (first floor) to check-in and test prep.  All radiology patients and guests should use entrance C2 at [facility], accessed from [facility], even though the hospital's physical address listed is [address]   There is spacious parking and easy access to the radiology department from the [facility] Vascular entrance. Please enter here and check-in with the desk attendant.   Please follow these instructions carefully (unless otherwise directed):  An IV will be required for this test and Nitroglycerin will be given.  Hold all erectile dysfunction medications at least 3 days (72 hrs) prior to test. (Ie viagra, cialis, sildenafil, tadalafil, etc)   On the Night Before the Test: Be sure to Drink plenty of water. Do not consume any caffeinated/decaffeinated beverages or chocolate 12 hours prior to your test. Do not take any antihistamines 12  hours prior to your test.   On the Day of the Test: Drink plenty of water until 1 hour prior to the test. Do not eat any food 1 hour prior to test. You may take your regular medications prior to the test.  Take metoprolol (Lopressor) one tablet  ( 100 mg ) by mouth two hours prior to test.      After the Test: Drink plenty of water. After receiving IV contrast, you may experience a mild flushed feeling. This is normal. On occasion, you may experience a mild rash up to 24 hours after the test. This is not dangerous. If this occurs, you can take Benadryl 25 mg and increase your fluid intake. If you experience trouble breathing, this can be serious. If it is severe call 911 IMMEDIATELY. If it is mild, please call our office.  We will call to schedule your test 2-4 weeks out understanding that some insurance companies will need an authorization prior to the service being performed.   For more information and frequently asked questions, please visit our website : [email or web address]  For non-scheduling related questions, please contact the cardiac imaging nurse navigator should you have any questions/concerns: Cardiac Imaging Nurse Navigators Direct Office Dial: [phone]   For scheduling needs, including cancellations and rescheduling, please call [place], [phone].    Follow-Up: At [facility], you and your health needs are our priority.  As part of our continuing mission to provide you with exceptional heart care, we have created designated Provider Care Teams.  These  Care Teams include your primary Cardiologist (physician) and Advanced Practice Providers (APPs -  Physician Assistants and Nurse Practitioners) who all work together to provide you with the care you need, when you need it.  We recommend signing up for the patient portal called "MyChart".  Sign up information is provided on this After Visit Summary.  MyChart is used to connect with patients for  Virtual Visits (Telemedicine).  Patients are able to view lab/test results, encounter notes, upcoming appointments, etc.  Non-urgent messages can be sent to your provider as well.   To learn more about what you can do with MyChart, go to [email or web address].    Your next appointment:   3 month(s)  Provider:   Eligha Bridegroom, NP

## 2022-12-03 ENCOUNTER — Telehealth: Payer: Self-pay | Admitting: Nurse Practitioner

## 2022-12-03 DIAGNOSIS — E871 Hypo-osmolality and hyponatremia: Secondary | ICD-10-CM

## 2022-12-03 DIAGNOSIS — I10 Essential (primary) hypertension: Secondary | ICD-10-CM

## 2022-12-03 DIAGNOSIS — R0609 Other forms of dyspnea: Secondary | ICD-10-CM

## 2022-12-03 NOTE — Telephone Encounter (Signed)
See results and plan of care regarding BMET completed 12/02/22. Additionally, we are ordering echocardiogram for evaluation of heart and valve function.

## 2022-12-04 ENCOUNTER — Other Ambulatory Visit (HOSPITAL_COMMUNITY): Payer: Medicaid Other

## 2022-12-04 NOTE — Telephone Encounter (Signed)
Will also get cortisol level to evaluate adrenal function.

## 2022-12-04 NOTE — Addendum Note (Signed)
Addended by: Levi Aland on: 12/04/2022 09:15 AM   Modules accepted: Orders

## 2022-12-07 ENCOUNTER — Ambulatory Visit (HOSPITAL_COMMUNITY): Payer: Medicaid Other | Attending: Cardiovascular Disease

## 2022-12-07 DIAGNOSIS — R0609 Other forms of dyspnea: Secondary | ICD-10-CM | POA: Insufficient documentation

## 2022-12-07 LAB — ECHOCARDIOGRAM COMPLETE
Area-P 1/2: 5.02 cm2
S' Lateral: 2.8 cm

## 2022-12-08 ENCOUNTER — Other Ambulatory Visit: Payer: Self-pay | Admitting: *Deleted

## 2022-12-08 DIAGNOSIS — R0609 Other forms of dyspnea: Secondary | ICD-10-CM

## 2022-12-08 DIAGNOSIS — E871 Hypo-osmolality and hyponatremia: Secondary | ICD-10-CM | POA: Diagnosis not present

## 2022-12-08 DIAGNOSIS — I10 Essential (primary) hypertension: Secondary | ICD-10-CM

## 2022-12-09 LAB — BASIC METABOLIC PANEL
BUN/Creatinine Ratio: 9 (ref 9–20)
BUN: 5 mg/dL — ABNORMAL LOW (ref 6–24)
CO2: 24 mmol/L (ref 20–29)
Calcium: 8.9 mg/dL (ref 8.7–10.2)
Chloride: 88 mmol/L — ABNORMAL LOW (ref 96–106)
Creatinine, Ser: 0.54 mg/dL — ABNORMAL LOW (ref 0.76–1.27)
Glucose: 68 mg/dL — ABNORMAL LOW (ref 70–99)
Potassium: 4.4 mmol/L (ref 3.5–5.2)
Sodium: 129 mmol/L — ABNORMAL LOW (ref 134–144)
eGFR: 118 mL/min/{1.73_m2} (ref >59)

## 2022-12-09 LAB — CORTISOL: Cortisol: 10.2 ug/dL (ref 6.2–19.4)

## 2022-12-16 ENCOUNTER — Telehealth (HOSPITAL_COMMUNITY): Payer: Self-pay | Admitting: *Deleted

## 2022-12-16 NOTE — Telephone Encounter (Signed)
Attempted to call patient regarding upcoming cardiac CT appointment. Left message with wife for patient to call back.  Larey Brick RN Navigator Cardiac Imaging [facility] [phone] Office [phone] Cell

## 2022-12-17 ENCOUNTER — Ambulatory Visit (HOSPITAL_COMMUNITY)
Admission: RE | Admit: 2022-12-17 | Discharge: 2022-12-17 | Disposition: A | Discharge status: Home / Self Care | Payer: Medicaid Other | Source: Ambulatory Visit | Attending: Nurse Practitioner | Admitting: Nurse Practitioner

## 2022-12-17 DIAGNOSIS — I2510 Atherosclerotic heart disease of native coronary artery without angina pectoris: Secondary | ICD-10-CM | POA: Insufficient documentation

## 2022-12-17 DIAGNOSIS — I700 Atherosclerosis of aorta: Secondary | ICD-10-CM | POA: Diagnosis not present

## 2022-12-17 DIAGNOSIS — I10 Essential (primary) hypertension: Secondary | ICD-10-CM | POA: Diagnosis present

## 2022-12-17 MED ORDER — DILTIAZEM HCL 25 MG/5ML IV SOLN
5.0000 mg | Freq: Once | INTRAVENOUS | Status: AC
  Administered 2022-12-17: 5 mg via INTRAVENOUS

## 2022-12-17 MED ORDER — NITROGLYCERIN 0.4 MG SL SUBL
0.8000 mg | SUBLINGUAL_TABLET | Freq: Once | SUBLINGUAL | Status: AC
  Administered 2022-12-17: 0.8 mg via SUBLINGUAL

## 2022-12-17 MED ORDER — DILTIAZEM HCL 25 MG/5ML IV SOLN
INTRAVENOUS | Status: AC
  Filled 2022-12-17: qty 5

## 2022-12-17 MED ORDER — IOHEXOL 350 MG/ML SOLN
100.0000 mL | Freq: Once | INTRAVENOUS | Status: AC | PRN
  Administered 2022-12-17: 100 mL via INTRAVENOUS

## 2022-12-17 MED ORDER — NITROGLYCERIN 0.4 MG SL SUBL
SUBLINGUAL_TABLET | SUBLINGUAL | Status: AC
  Filled 2022-12-17: qty 2

## 2022-12-17 MED ORDER — SODIUM CHLORIDE 0.9 % IV BOLUS
250.0000 mL | Freq: Once | INTRAVENOUS | Status: AC
  Administered 2022-12-17: 250 mL via INTRAVENOUS

## 2022-12-22 ENCOUNTER — Other Ambulatory Visit: Payer: Self-pay | Admitting: *Deleted

## 2022-12-22 MED ORDER — ROSUVASTATIN CALCIUM 5 MG PO TABS: 5.0000 mg | ORAL_TABLET | Freq: Every day | ORAL | 11 refills | Status: DC

## 2023-01-01 DIAGNOSIS — B3781 Candidal esophagitis: Secondary | ICD-10-CM | POA: Diagnosis not present

## 2023-01-01 DIAGNOSIS — K2960 Other gastritis without bleeding: Secondary | ICD-10-CM | POA: Diagnosis not present

## 2023-01-01 DIAGNOSIS — K229 Disease of esophagus, unspecified: Secondary | ICD-10-CM | POA: Diagnosis not present

## 2023-01-01 DIAGNOSIS — K269 Duodenal ulcer, unspecified as acute or chronic, without hemorrhage or perforation: Secondary | ICD-10-CM | POA: Diagnosis not present

## 2023-01-01 DIAGNOSIS — D509 Iron deficiency anemia, unspecified: Secondary | ICD-10-CM | POA: Diagnosis not present

## 2023-01-01 DIAGNOSIS — K2980 Duodenitis without bleeding: Secondary | ICD-10-CM | POA: Diagnosis not present

## 2023-01-01 DIAGNOSIS — K2950 Unspecified chronic gastritis without bleeding: Secondary | ICD-10-CM | POA: Diagnosis not present

## 2023-01-01 DIAGNOSIS — K319 Disease of stomach and duodenum, unspecified: Secondary | ICD-10-CM | POA: Diagnosis not present

## 2023-03-02 DIAGNOSIS — R600 Localized edema: Secondary | ICD-10-CM | POA: Diagnosis not present

## 2023-03-02 DIAGNOSIS — I10 Essential (primary) hypertension: Secondary | ICD-10-CM | POA: Diagnosis not present

## 2023-03-02 DIAGNOSIS — F17200 Nicotine dependence, unspecified, uncomplicated: Secondary | ICD-10-CM | POA: Diagnosis not present

## 2023-03-08 NOTE — Progress Notes (Signed)
Cardiology Office Note:  .   Date:  03/10/2023 ID:  Allen Wood, DOB Nov 14, 1970, MRN [id number] PCP: Noberto Retort, MD [facility] Providers Cardiologist:  None   Patient Profile: .      PMH Elevated liver enzymes Anxiety EToh dependence Tobacco use disorder COPD Coronary artery calcification on CT  Referred to cardiology and seen by me on 12/02/22 for new patient consult for coronary artery calcification on lung cancer screening CT. He is active as a self-employed Oceanographer. He denied chest pain, palpitations, or syncope. Reported occasional shortness of breath, which he attributes to his COPD. No edema, orthopnea, or PND. He also mentions experiencing cramps in his neck, the cause of which is unclear. He has a history of sleep apnea and is currently trying to quit smoking. He has been taking over-the-counter iron supplements due to a low iron level detected on a previous blood test. BP has been elevated, and his primary care doctor has suggested starting a blood pressure medication. BP was elevated at the office visit and we had him start amlodipine 5 mg daily with plan to monitor home BP. He has never been on cholesterol medication. His wife tells him he snores and he admits to daytime somnolence. He did not report significant family history of CAD. ASCVD risk score is 3.1%. Due to some concerning symptoms, we elected to proceed with coronary CTA which revealed CAC score of 15.3 (60th percentile) with TPV of 20 mm, mild CAD, scattered aortic atherosclerosis and small PFO. Based on these results, he was advised to start rosuvastatin 5 mg daily.        History of Present Illness: .   Allen Wood is a very pleasant 53 y.o. male  who is here today for follow-up of CAD. He reports that  amlodipine was recently changed to losartan and furosemide by his general practitioner due to severe leg swelling, which he describes as the 'worst case' his doctor had ever  seen. The swelling is slowly improving. We discussed concern about his snoring voiced by his wife and friends when he goes on fishing trips. They also report apneic episodes. He reports daytime somnolence and falls asleep if he sits down for any period of time. He expresses concern about his heart rate, which he feels runs faster than it should. He drinks one Monster energy drink per day and rarely drinks water. Home BP readings have been 120s to 130s over 80s to 90s. He does not monitor on a consistent basis. He has been taking iron supplements for his anemia and reports no issues with this treatment. He is having cramps in his neck, particularly when exerting himself. His PCP listened for carotid bruit and did not hear one. He takes magnesium for cramping. He expresses a desire to start exercising again, specifically mentioning weight training and running, but is unsure if his heart condition would allow for this. He also expresses a desire to quit smoking.  He denies chest pain, dyspnea, orthopnea, PND, palpitations, presyncope, syncope.  ROS: See HPI       Studies Reviewed: Marland Kitchen    Lung Cancer Screening CT     10/14/22  IMPRESSION: 1. Lung-RADS 2, benign appearance or behavior. Continue annual screening with low-dose chest CT without contrast in 12 months. 2. Mild coronary artery calcifications, recommend ASCVD risk assessment. 3. Aortic Atherosclerosis (ICD10-I70.0) and Emphysema (ICD10-J43.9).    Risk Assessment/Calculations:    StopBANG score is 6  Physical Exam:   VS: BP 126/82   Pulse 90   Ht 5\' 11"  (1.803 m)   Wt 168 lb (76.2 kg)   BMI 23.43 kg/m   Wt Readings from Last 3 Encounters:  03/10/23 168 lb (76.2 kg)  12/02/22 155 lb 9.6 oz (70.6 kg)  12/08/15 151 lb 1.6 oz (68.5 kg)     GEN: Well nourished, well developed in no acute distress NECK: No JVD; No carotid bruits CARDIAC: RRR, no murmurs, rubs, gallops RESPIRATORY:  Clear to auscultation without rales,  wheezing or rhonchi  ABDOMEN: Soft, non-tender, non-distended EXTREMITIES:  Bilateral LE edema; No deformity     ASSESSMENT AND PLAN: .    Coronary artery disease/Aortic atherosclerosis: Mild non-obstructive CAD on CCTA with coronary calcium score of 15.3 placing him at 60th percentile for age/sex matched controls, TPV is mild. He denies chest pain, dyspnea, or other symptoms concerning for angina. No indication for further ischemic evaluation at this time. He would like to gradually increase exercise. Encouraged gradual increase to aim for 150 minutes of moderate intensity exercise each week.  He has a long history of tobacco abuse which I advised may contribute to some shortness of breath.  Report any concerning symptoms or seek immediate medical attention. We will continue rosuvastatin and losartan. Focus on secondary prevention including heart healthy mostly plant based diet avoiding saturated fat, processed foods, simple carbohydrates, and sugar along with aiming for at least 150 minutes of moderate intensity exercise each week.   Hypertension: We started amlodipine for elevated BP at last office visit, unfortunately he had significant lower extremity swelling on 5 mg daily. I have marked this as an allergy as he continues to have significant swelling.  He was seen by PCP and amlodipine was stopped and he was started on losartan 100 mg daily.  We will check renal function with upcoming lab work.  BP is well controlled on initial evaluation and on my recheck.   Bilateral leg edema: Legs remain swollen which he reports started while taking amlodipine 5 mg daily.  PCP discontinued amlodipine and started losartan for hypertension and he was given short course of Lasix 20 mg daily for management of swelling.  Encouraged leg elevation.  Hyperlipidemia LDL goal < 70: Most recent lipid panel to review from 09/07/22 revealed total cholesterol 158, LDL-C 32, HDL 116, and triglycerides 38. He is tolerating  rosuvastatin 5 mg daily without any concerning side effects.  We will have him repeat lipid panel when he can return fasting.  Snoring: His wife has reported snoring and episodes of apnea. STOP-BANG score is 6.  We will get home sleep study for evaluation of sleep apnea.   Tobacco abuse: Feels motivated to quit. Assistance offered but denied. Complete cessation advised.    Plan/Goals: 1: Gradually increase exercise to aim for 150 minutes of moderate intensity exercise each week with an additional weight lifting or resistance training at least 2 days per week. 2: Eat a heart healthy diet of mostly fruit, vegetables, whole grains and lean protein and limit sugar, sodium, processed foods, and simple carbohydrates.        Dispo: 6 months with me  Signed, Eligha Bridegroom, NP-C

## 2023-03-10 ENCOUNTER — Encounter (HOSPITAL_BASED_OUTPATIENT_CLINIC_OR_DEPARTMENT_OTHER): Payer: Self-pay | Admitting: Nurse Practitioner

## 2023-03-10 ENCOUNTER — Ambulatory Visit (HOSPITAL_BASED_OUTPATIENT_CLINIC_OR_DEPARTMENT_OTHER): Payer: Medicaid Other | Admitting: Nurse Practitioner

## 2023-03-10 VITALS — BP 126/82 | HR 90 | Ht 71.0 in | Wt 168.0 lb

## 2023-03-10 DIAGNOSIS — R0683 Snoring: Secondary | ICD-10-CM

## 2023-03-10 DIAGNOSIS — R600 Localized edema: Secondary | ICD-10-CM

## 2023-03-10 DIAGNOSIS — E785 Hyperlipidemia, unspecified: Secondary | ICD-10-CM | POA: Diagnosis not present

## 2023-03-10 DIAGNOSIS — I10 Essential (primary) hypertension: Secondary | ICD-10-CM

## 2023-03-10 DIAGNOSIS — I700 Atherosclerosis of aorta: Secondary | ICD-10-CM | POA: Diagnosis not present

## 2023-03-10 DIAGNOSIS — Z720 Tobacco use: Secondary | ICD-10-CM | POA: Diagnosis not present

## 2023-03-10 DIAGNOSIS — I2510 Atherosclerotic heart disease of native coronary artery without angina pectoris: Secondary | ICD-10-CM

## 2023-03-10 NOTE — Patient Instructions (Addendum)
Medication Instructions:  Your physician recommends that you continue on your current medications as directed. Please refer to the Current Medication list given to you today.  Lab Work: Your physician recommends that you return for lab work in: [employer], LIPIDS, and CMP  Testing/Procedures: Your physician recommends a sleep study (Itamar).   Follow-Up: At [facility], you and your health needs are our priority.  As part of our continuing mission to provide you with exceptional heart care, we have created designated Provider Care Teams.  These Care Teams include your primary Cardiologist (physician) and Advanced Practice Providers (APPs -  Physician Assistants and Nurse Practitioners) who all work together to provide you with the care you need, when you need it.  We recommend signing up for the patient portal called "MyChart".  Sign up information is provided on this After Visit Summary.  MyChart is used to connect with patients for Virtual Visits (Telemedicine).  Patients are able to view lab/test results, encounter notes, upcoming appointments, etc.  Non-urgent messages can be sent to your provider as well.   To learn more about what you can do with MyChart, go to [email or web address].    Your next appointment:   6 month(s)  Provider:   Eligha Bridegroom, NP  Other Instructions: WatchPAT?  Is a FDA cleared portable home sleep study test that uses a watch and 3 points of contact to monitor 7 different channels, including your heart rate, oxygen saturations, body position, snoring, and chest motion.  The study is easy to use from the comfort of your own home and accurately detect sleep apnea.  Before bed, you attach the chest sensor, attached the sleep apnea bracelet to your nondominant hand, and attach the finger probe.  After the study, the raw data is downloaded from the watch and scored for apnea events.   For more information: [email or web address]  Patient  Testing Instructions:  Do not put battery into the device until bedtime when you are ready to begin the test. Please call the support number if you need assistance after following the instructions below: 24 hour support line- [phone] or ITAMAR support at [phone] (option 2)  Download the IntelWatchPAT One" app through the google play store or App Store  Be sure to turn on or enable access to bluetooth in settlings on your smartphone/ device  Make sure no other bluetooth devices are on and within the vicinity of your smartphone/ device and WatchPAT watch during testing.  Make sure to leave your smart phone/ device plugged in and charging all night.  When ready for bed:  Follow the instructions step by step in the WatchPAT One App to activate the testing device. For additional instructions, including video instruction, visit the WatchPAT One video on Youtube. You can search for WatchPat One within Youtube (video is 4 minutes and 18 seconds) or enter: https://youtube/watch?[email or web address]_vbiwxE Please note: You will be prompted to enter a Pin to connect via bluetooth when starting the test. The PIN will be assigned to you when you receive the test.  The device is disposable, but it recommended that you retain the device until you receive a call letting you know the study has been received and the results have been interpreted.  We will let you know if the study did not transmit to [place] properly after the test is completed. You do not need to call us to confirm the receipt of the test.  Please complete the test within 48 hours of receiving PIN.   Frequently  Asked Questions:  What is Watch Dennie Bible one?  A single use fully disposable home sleep apnea testing device and will not need to be returned after completion.  What are the requirements to use WatchPAT one?  The be able to have a successful watchpat one sleep study, you should have your Watch pat one device, your smart phone, watch pat one app, your  PIN number and Internet access What type of phone do I need?  You should have a smart phone that uses Android 5.1 and above or any Iphone with IOS 10 and above How can I download the WatchPAT one app?  Based on your device type search for WatchPAT one app either in google play for android devices or APP store for Iphone's Where will I get my PIN for the study?  Your PIN will be provided by your physician's office. It is used for authentication and if you lose/forget your PIN, please reach out to your providers office.  I do not have Internet at home. Can I do WatchPAT one study?  WatchPAT One needs Internet connection throughout the night to be able to transmit the sleep data. You can use your home/local internet or your cellular's data package. However, it is always recommended to use home/local Internet. It is estimated that between 20MB-30MB will be used with each study.However, the application will be looking for space in the phone to start the study.  What happens if I lose internet or bluetooth connection?  During the internet disconnection, your phone will not be able to transmit the sleep data. All the data, will be stored in your phone. As soon as the internet connection is back on, the phone will being sending the sleep data. During the bluetooth disconnection, WatchPAT one will not be able to to send the sleep data to your phone. Data will be kept in the [facility] one until two devices have bluetooth connection back on. As soon as the connection is back on, WatchPAT one will send the sleep data to the phone.  How long do I need to wear the WatchPAT one?  After you start the study, you should wear the device at least 6 hours.  How far should I keep my phone from the device?  During the night, your phone should be within 15 feet.  What happens if I leave the room for restroom or other reasons?  Leaving the room for any reason will not cause any problem. As soon as your get back to the  room, both devices will reconnect and will continue to send the sleep data. Can I use my phone during the sleep study?  Yes, you can use your phone as usual during the study. But it is recommended to put your watchpat one on when you are ready to go to bed.  How will I get my study results?  A soon as you completed your study, your sleep data will be sent to the provider. They will then share the results with you when they are ready.   Goals: 1: Gradually increase exercise to aim for 150 minutes of moderate intensity exercise each week with an additional weight lifting or resistance training at least 2 days per week. 2: Eat a heart healthy diet of mostly fruit, vegetables, whole grains and lean protein and limit sugar, sodium, processed foods, and simple carbohydrates.

## 2023-04-12 DIAGNOSIS — R21 Rash and other nonspecific skin eruption: Secondary | ICD-10-CM | POA: Diagnosis not present

## 2023-04-12 DIAGNOSIS — R202 Paresthesia of skin: Secondary | ICD-10-CM | POA: Diagnosis not present

## 2023-04-12 DIAGNOSIS — M545 Low back pain, unspecified: Secondary | ICD-10-CM | POA: Diagnosis not present

## 2023-04-23 DIAGNOSIS — M47896 Other spondylosis, lumbar region: Secondary | ICD-10-CM | POA: Diagnosis not present

## 2023-04-23 DIAGNOSIS — M5416 Radiculopathy, lumbar region: Secondary | ICD-10-CM | POA: Diagnosis not present

## 2023-05-07 DIAGNOSIS — M5416 Radiculopathy, lumbar region: Secondary | ICD-10-CM | POA: Diagnosis not present

## 2023-05-12 DIAGNOSIS — M5416 Radiculopathy, lumbar region: Secondary | ICD-10-CM | POA: Diagnosis not present

## 2023-05-21 DIAGNOSIS — M5416 Radiculopathy, lumbar region: Secondary | ICD-10-CM | POA: Diagnosis not present

## 2023-05-28 DIAGNOSIS — M5416 Radiculopathy, lumbar region: Secondary | ICD-10-CM | POA: Diagnosis not present

## 2023-06-02 DIAGNOSIS — M5416 Radiculopathy, lumbar region: Secondary | ICD-10-CM | POA: Diagnosis not present

## 2023-06-11 DIAGNOSIS — M5416 Radiculopathy, lumbar region: Secondary | ICD-10-CM | POA: Diagnosis not present

## 2023-06-25 DIAGNOSIS — M5416 Radiculopathy, lumbar region: Secondary | ICD-10-CM | POA: Diagnosis not present

## 2023-07-23 DIAGNOSIS — M47896 Other spondylosis, lumbar region: Secondary | ICD-10-CM | POA: Diagnosis not present

## 2023-07-28 DIAGNOSIS — M47816 Spondylosis without myelopathy or radiculopathy, lumbar region: Secondary | ICD-10-CM | POA: Diagnosis not present

## 2023-07-28 DIAGNOSIS — M5416 Radiculopathy, lumbar region: Secondary | ICD-10-CM | POA: Diagnosis not present

## 2023-08-12 DIAGNOSIS — M5416 Radiculopathy, lumbar region: Secondary | ICD-10-CM | POA: Diagnosis not present

## 2023-10-02 NOTE — Progress Notes (Unsigned)
 Cardiology Office Note:  .   Date:  10/02/2023 ID:  Allen Wood, DOB 08-23-1970, MRN [id number] PCP: Allen Elsie SAUNDERS, MD [facility] Providers Cardiologist:  None   Patient Profile: .      PMH Elevated liver enzymes Anxiety EToh dependence Tobacco use disorder COPD Hyperlipidemia Hypertension CAD CCTA 12/17/22 Calcium  score 15.3 (60th percentile) Mild nonobstructive CAD TPV mild at 20 mm3 which is 23rd percentile  Referred to cardiology and seen by me on 12/02/22 as new patient for coronary artery calcification on lung cancer screening CT. Active lifestyle as a self-employed Oceanographer. He denied chest pain, palpitations, or syncope. Reported occasional shortness of breath, which he attributes to his COPD. No edema, orthopnea, or PND. Cramps in his neck, the cause of which is unclear. History of sleep apnea and is currently trying to quit smoking. He has been taking over-the-counter iron supplements due to a low iron level detected on a previous blood test. BP has been elevated, and his primary care doctor has suggested starting a blood pressure medication. BP was elevated at the office visit and we had him start amlodipine  5 mg daily with plan to monitor home BP. He has never been on cholesterol medication. His wife tells him he snores and he admits to daytime somnolence. He did not report significant family history of CAD. ASCVD risk score is 3.1%. Due to some concerning symptoms, we elected to proceed with coronary CTA which revealed CAC score of 15.3 (60th percentile) with TPV of 20 mm, mild CAD, scattered aortic atherosclerosis and small PFO. Based on these results, he was advised to start rosuvastatin  5 mg daily.   Seen in follow-up by me on 03/10/23. Reported amlodipine  recently changed to losartan and furosemide  by his general practitioner due to severe leg swelling, which he describes as the 'worst case' his doctor had ever seen. Swelling is slowly  improving. We discussed concern about his snoring voiced by his wife and friends when he goes on fishing trips. They also report apneic episodes. He reported daytime somnolence and falls asleep if he sits down for any period of time. He expressed concern about his heart rate, which he feels runs faster than it should. He drinks one Monster energy drink per day and rarely drinks water . Home BP readings have been 120s to 130s over 80s to 90s, but not monitored consistently. On iron supplements for anemia and reports no issues with this treatment. He is having cramps in his neck, particularly when exerting himself. His PCP listened for carotid bruit and did not hear one. Takes magnesium  for cramping. Expressed desire to start exercising again, specifically mentioning weight training and running, but is unsure if his heart condition would allow for this. Expressed desire to quit smoking. No chest pain, dyspnea, orthopnea, PND, palpitations, presyncope, syncope. Sleep study was ordered but there is no documentation of f/u.        History of Present Illness: .   Allen Wood is a very pleasant 53 y.o. male  who is here today for follow-up of CAD.   ROS: See HPI       Studies Reviewed: [facility]    Lung Cancer Screening CT     10/14/22  IMPRESSION: 1. Lung-RADS 2, benign appearance or behavior. Continue annual screening with low-dose chest CT without contrast in 12 months. 2. Mild coronary artery calcifications, recommend ASCVD risk assessment. 3. Aortic Atherosclerosis (ICD10-I70.0) and Emphysema (ICD10-J43.9).    Risk Assessment/Calculations:    StopBANG score is 6  No BP recorded.  {Refresh Note OR Click here to enter BP  :1}***       Physical Exam:   VS: There were no vitals taken for this visit.  Wt Readings from Last 3 Encounters:  03/10/23 168 lb (76.2 kg)  12/02/22 155 lb 9.6 oz (70.6 kg)  12/08/15 151 lb 1.6 oz (68.5 kg)     GEN: Well nourished, well developed in no acute  distress NECK: No JVD; No carotid bruits CARDIAC: RRR, no murmurs, rubs, gallops RESPIRATORY:  Clear to auscultation without rales, wheezing or rhonchi  ABDOMEN: Soft, non-tender, non-distended EXTREMITIES:  Bilateral LE edema; No deformity     ASSESSMENT AND PLAN: .    Coronary artery disease/Aortic atherosclerosis: Mild non-obstructive CAD on CCTA with coronary calcium  score of 15.3 placing him at 60th percentile for age/sex matched controls, TPV is mild. He denies chest pain, dyspnea, or other symptoms concerning for angina. No indication for further ischemic evaluation at this time. He would like to gradually increase exercise. Encouraged gradual increase to aim for 150 minutes of moderate intensity exercise each week.  He has a long history of tobacco abuse which I advised may contribute to some shortness of breath.  Report any concerning symptoms or seek immediate medical attention. We will continue rosuvastatin  and losartan. Focus on secondary prevention including heart healthy mostly plant based diet avoiding saturated fat, processed foods, simple carbohydrates, and sugar along with aiming for at least 150 minutes of moderate intensity exercise each week.   Hypertension: We started amlodipine  for elevated BP at last office visit, unfortunately he had significant lower extremity swelling on 5 mg daily. I have marked this as an allergy as he continues to have significant swelling.  He was seen by PCP and amlodipine  was stopped and he was started on losartan 100 mg daily.  We will check renal function with upcoming lab work.  BP is well controlled on initial evaluation and on my recheck.   Bilateral leg edema: Legs remain swollen which he reports started while taking amlodipine  5 mg daily.  PCP discontinued amlodipine  and started losartan for hypertension and he was given short course of Lasix  20 mg daily for management of swelling.  Encouraged leg elevation.  Hyperlipidemia LDL goal < 70: Most  recent lipid panel to review from 09/07/22 revealed total cholesterol 158, LDL-C 32, HDL 116, and triglycerides 38. He is tolerating rosuvastatin  5 mg daily without any concerning side effects.  We will have him repeat lipid panel when he can return fasting.  Snoring: His wife has reported snoring and episodes of apnea. STOP-BANG score is 6.  We will get home sleep study for evaluation of sleep apnea.   Tobacco abuse: Feels motivated to quit. Assistance offered but denied. Complete cessation advised.    Plan/Goals:          Dispo: 6 months with me  Signed, Rosaline Bane, NP-C

## 2023-10-04 ENCOUNTER — Encounter (HOSPITAL_BASED_OUTPATIENT_CLINIC_OR_DEPARTMENT_OTHER): Payer: Self-pay | Admitting: Nurse Practitioner

## 2023-10-04 ENCOUNTER — Ambulatory Visit (HOSPITAL_BASED_OUTPATIENT_CLINIC_OR_DEPARTMENT_OTHER): Admitting: Nurse Practitioner

## 2023-10-04 VITALS — BP 160/90 | HR 100 | Ht 71.0 in | Wt 171.4 lb

## 2023-10-04 DIAGNOSIS — I10 Essential (primary) hypertension: Secondary | ICD-10-CM | POA: Diagnosis not present

## 2023-10-04 DIAGNOSIS — I2510 Atherosclerotic heart disease of native coronary artery without angina pectoris: Secondary | ICD-10-CM

## 2023-10-04 DIAGNOSIS — E785 Hyperlipidemia, unspecified: Secondary | ICD-10-CM | POA: Diagnosis not present

## 2023-10-04 DIAGNOSIS — R0609 Other forms of dyspnea: Secondary | ICD-10-CM | POA: Diagnosis not present

## 2023-10-04 DIAGNOSIS — Z720 Tobacco use: Secondary | ICD-10-CM

## 2023-10-04 DIAGNOSIS — R600 Localized edema: Secondary | ICD-10-CM

## 2023-10-04 DIAGNOSIS — I700 Atherosclerosis of aorta: Secondary | ICD-10-CM

## 2023-10-04 DIAGNOSIS — R0683 Snoring: Secondary | ICD-10-CM

## 2023-10-04 MED ORDER — BISOPROLOL FUMARATE 2.5 MG PO TABS
2.5000 mg | ORAL_TABLET | Freq: Every day | ORAL | 3 refills | Status: DC
Start: 2023-10-04 — End: 2023-10-22

## 2023-10-04 NOTE — Patient Instructions (Signed)
 Medication Instructions:   START Bisoprolol  one ( 1) tablet by mouth ( 2.5 mg) daily.  *If you need a refill on your cardiac medications before your next appointment, please call your pharmacy*  Lab Work:  None ordered.  If you have labs (blood work) drawn today and your tests are completely normal, you will receive your results only by: MyChart Message (if you have MyChart) OR A paper copy in the mail If you have any lab test that is abnormal or we need to change your treatment, we will call you to review the results.  Testing/Procedures:  None ordered.  Follow-Up: At [facility], you and your health needs are our priority.  As part of our continuing mission to provide you with exceptional heart care, our providers are all part of one team.  This team includes your primary Cardiologist (physician) and Advanced Practice Providers or APPs (Physician Assistants and Nurse Practitioners) who all work together to provide you with the care you need, when you need it.  Your next appointment:   6 month(s)  Provider:   Rosaline Bane, NP    We recommend signing up for the patient portal called MyChart.  Sign up information is provided on this After Visit Summary.  MyChart is used to connect with patients for Virtual Visits (Telemedicine).  Patients are able to view lab/test results, encounter notes, upcoming appointments, etc.  Non-urgent messages can be sent to your provider as well.   To learn more about what you can do with MyChart, go to [email or web address].   Other Instructions  Your physician wants you to follow-up in: 6 months.  You will receive a reminder letter in the mail two months in advance. If you don't receive a letter, please call our office to schedule the follow-up appointment.  The office will be in touch with your about the sleep study.   HOW TO TAKE YOUR BLOOD PRESSURE  Rest 5 minutes before taking your blood pressure. Don't  smoke or drink  caffeinated beverages for at least 30 minutes before. Take your blood pressure before (not after) you eat. Sit comfortably with your back supported and both feet on the floor ( don't cross your legs). Elevate your arm to heart level on a table or a desk. Use the proper sized cuff.  It should fit smoothly and snugly around your bare upper arm.  There should be  Enough room to slip a fingertip under the cuff.  The bottom edge of the cuff should be 1 inch above the crease Of the elbow. Please monitor your blood pressure once daily 2 hours after your am medication. Your goal for your blood pressure is 130 (systolic) top number or over 80 ( diastolic) bottom number.    ----Avoid cold medicines with D or DM at the end of them----  Adopting a Healthy Lifestyle.   Weight: Know what a healthy weight is for you (roughly BMI <25) and aim to maintain this. You can calculate your body mass index on your smart phone. Unfortunately, this is not the most accurate measure of healthy weight, but it is the simplest measurement to use. A more accurate measurement involves body scanning which measures lean muscle, fat tissue and bony density. We do not have this equipment at [facility].    Diet: Aim for 7+ servings of fruits and vegetables daily Limit animal fats in diet for cholesterol and heart health - choose grass fed whenever available Avoid highly processed foods (fast food burgers, tacos, fried  chicken, pizza, hot dogs, french fries)  Saturated fat comes in the form of butter, lard, coconut oil, margarine, partially hydrogenated oils, and fat in meat. These increase your risk of cardiovascular disease.  Use healthy plant oils, such as olive, canola, soy, corn, sunflower and peanut.  Whole foods such as fruits, vegetables and whole grains have fiber  Men need > 38 grams of fiber per day Women need > 25 grams of fiber per day  Load up on vegetables and fruits - one-half of your plate: Aim for color and  variety, and remember that potatoes dont count. Go for whole grains - one-quarter of your plate: Whole wheat, barley, wheat berries, quinoa, oats, brown rice, and foods made with them. If you want pasta, go with whole wheat pasta. Protein power - one-quarter of your plate: Fish, chicken, beans, and nuts are all healthy, versatile protein sources. Limit red meat. You need carbohydrates for energy! The type of carbohydrate is more important than the amount. Choose carbohydrates such as vegetables, fruits, whole grains, beans, and nuts in the place of white rice, white pasta, potatoes (baked or fried), macaroni and cheese, cakes, cookies, and donuts.  If youre thirsty, drink water . Coffee and tea are good in moderation, but skip sugary drinks and limit milk and dairy products to one or two daily servings. Keep sugar intake at 6 teaspoons or 24 grams or LESS       Exercise: Aim for 150 min of moderate intensity exercise weekly for heart health, and weights twice weekly for bone health Stay active - any steps are better than no steps! Aim for 7-9 hours of sleep daily

## 2023-10-06 NOTE — Addendum Note (Signed)
 Addended by: MARITZA SOR F on: 10/06/2023 02:19 PM   Modules accepted: Orders

## 2023-10-08 ENCOUNTER — Other Ambulatory Visit (HOSPITAL_COMMUNITY): Payer: Self-pay

## 2023-10-08 ENCOUNTER — Telehealth: Payer: Self-pay

## 2023-10-08 NOTE — Telephone Encounter (Signed)
 Pharmacy Patient Advocate Encounter   Received notification from Physician's Office that prior authorization for BISOPROLOL  is required/requested.   Insurance verification completed.   The patient is insured through [employer] .   BISOPROLOL  NON FORM ON PLAN Per test claim:  Metoprolol , Atenolol, Propranolol, Carvedilol, and Labetalol  is preferred by the insurance.  If suggested medication is appropriate, Please send in a new RX and discontinue this one. If not, please advise as to why it's not appropriate so that we may request a Prior Authorization. Please note, some preferred medications may still require a PA.  If the suggested medications have not been trialed and there are no contraindications to their use, the PA will not be submitted, as it will not be approved.

## 2023-10-08 NOTE — Telephone Encounter (Signed)
**Note De-Identified Ronnie Mallette Obfuscation** Ordering provider: Rosaline Bane, NP Associated diagnoses: Snoring-R06.83 and Witnessed Apnea-G47.00  WatchPAT PA obtained on 10/08/2023 by Shantice Menger, Avelina HERO, LPN. Authorization: Per the [employer], a PA is not required for CPT Code: 04199 (Itamar-HST):  Patient notified of PIN ([phone]) on 10/08/2023 Anissia Wessells Notification Method: phone.  Phone note routed to covering staff for follow-up.

## 2023-10-08 NOTE — Telephone Encounter (Signed)
 He needs cardioselective beta-blocker due to COPD and bisoprolol  provides better blood pressure control than metoprolol 

## 2023-10-08 NOTE — Telephone Encounter (Signed)
 Pharmacy Patient Advocate Encounter  Received notification from [employer] that Prior Authorization for BISOPROLOL  has been APPROVED from 10/08/23 to 10/07/24. Ran test claim, Copay is $4. This test claim was processed through [facility]- copay amounts may vary at other pharmacies due to pharmacy/plan contracts, or as the patient moves through the different stages of their insurance plan.

## 2023-10-11 NOTE — Telephone Encounter (Signed)
 Vm is full will try back.

## 2023-10-11 NOTE — Telephone Encounter (Signed)
 Patient does not have active MyChart. Please call to make him aware to pick up Bisoprolol . TY!  Allen Wood S Letha Mirabal, NP

## 2023-10-20 NOTE — Telephone Encounter (Signed)
 S/w pt about bisoprolol  and itamar. Pt is aware to pick up medication and send mychart message when pt starts itamar.

## 2023-10-20 NOTE — Telephone Encounter (Signed)
 S/w pt to start sleep study and start bisoprolol . Pt is aware.

## 2023-10-22 ENCOUNTER — Telehealth: Payer: Self-pay | Admitting: Nurse Practitioner

## 2023-10-22 DIAGNOSIS — Z125 Encounter for screening for malignant neoplasm of prostate: Secondary | ICD-10-CM | POA: Diagnosis not present

## 2023-10-22 DIAGNOSIS — J301 Allergic rhinitis due to pollen: Secondary | ICD-10-CM | POA: Diagnosis not present

## 2023-10-22 DIAGNOSIS — J410 Simple chronic bronchitis: Secondary | ICD-10-CM | POA: Diagnosis not present

## 2023-10-22 DIAGNOSIS — F17200 Nicotine dependence, unspecified, uncomplicated: Secondary | ICD-10-CM | POA: Diagnosis not present

## 2023-10-22 DIAGNOSIS — Z23 Encounter for immunization: Secondary | ICD-10-CM | POA: Diagnosis not present

## 2023-10-22 DIAGNOSIS — Z0000 Encounter for general adult medical examination without abnormal findings: Secondary | ICD-10-CM | POA: Diagnosis not present

## 2023-10-22 DIAGNOSIS — I10 Essential (primary) hypertension: Secondary | ICD-10-CM | POA: Diagnosis not present

## 2023-10-22 DIAGNOSIS — K269 Duodenal ulcer, unspecified as acute or chronic, without hemorrhage or perforation: Secondary | ICD-10-CM | POA: Diagnosis not present

## 2023-10-22 DIAGNOSIS — E7800 Pure hypercholesterolemia, unspecified: Secondary | ICD-10-CM | POA: Diagnosis not present

## 2023-10-22 MED ORDER — BISOPROLOL FUMARATE 2.5 MG PO TABS: 2.5000 mg | ORAL_TABLET | Freq: Every day | ORAL | 3 refills | Status: DC

## 2023-10-22 NOTE — Telephone Encounter (Signed)
 Pt's medication was sent to pt's pharmacy as requested. Confirmation received.

## 2023-10-22 NOTE — Telephone Encounter (Signed)
*  STAT* If patient is at the pharmacy, call can be transferred to refill team.   1. Which medications need to be refilled? (please list name of each medication and dose if known)   Bisoprolol  Fumarate 2.5 MG TABS   2. Which pharmacy/location (including street and city if local pharmacy) is medication to be sent to?  [id number] [address], [place] - [address]    3. Do they need a 30 day or 90 day supply? 90

## 2023-11-05 DIAGNOSIS — Z23 Encounter for immunization: Secondary | ICD-10-CM | POA: Diagnosis not present

## 2023-11-12 ENCOUNTER — Telehealth: Payer: Self-pay | Admitting: Nurse Practitioner

## 2023-11-12 DIAGNOSIS — I2510 Atherosclerotic heart disease of native coronary artery without angina pectoris: Secondary | ICD-10-CM | POA: Diagnosis not present

## 2023-11-12 DIAGNOSIS — I10 Essential (primary) hypertension: Secondary | ICD-10-CM | POA: Diagnosis not present

## 2023-11-12 DIAGNOSIS — R0600 Dyspnea, unspecified: Secondary | ICD-10-CM | POA: Diagnosis not present

## 2023-11-12 DIAGNOSIS — F17200 Nicotine dependence, unspecified, uncomplicated: Secondary | ICD-10-CM | POA: Diagnosis not present

## 2023-11-12 NOTE — Telephone Encounter (Signed)
 Spoke with patient who stated that started Bisoprolol  few weeks ago  Lower extremity swelling, shortness of breath, and significant weight gain  Saw PCP today who wanted to send patient to the hospital for HF, patient refused Was given Lasix  by PCP   Spoke with patient and explained the Bisoprolol  should not be contributing to swelling  Patient has new onset cough  Advised patient needed to go to ED as recommended by PCP, declined   Spoke with Dr Raford who agreed with ED recommendations, called patient back and gave her recommendations  Patient still declining ED but said he would think about it STRONGLY encouraged patient to go to ED

## 2023-11-12 NOTE — Telephone Encounter (Signed)
 Pt c/o swelling: STAT is pt has developed SOB within 24 hours  How much weight have you gained and in what time span?  16.8 lbs in 3 weeks. Patient thinks his new BP medication may be causing the swelling.   If swelling, where is the swelling located?  Legs + moved up into hips and abdomen. Saw PCP--being put on Lasix .   Are you currently taking a fluid pill?  PCP put patient on Lasix . He hasn't started it yet.   Are you currently SOB?  No   Do you have a log of your daily weights (if so, list)?  10/17: 186.8 lbs, BP was 161/99 sitting  Have you gained 3 pounds in a day or 5 pounds in a week?    Have you traveled recently? No

## 2023-11-15 ENCOUNTER — Telehealth: Payer: Self-pay | Admitting: Nurse Practitioner

## 2023-11-15 NOTE — Telephone Encounter (Signed)
 Returned call to patient. He called again to talk about the bisoprolol  and his symptoms.  He reports he did not go to the ER. I need to stay out of the hospital, I have work that has to be done that I can't do if I'm in the hospital. He is on lasix  40 mg daily.  Prescribed by PCP office.  He has lost 4 pounds Swelling is felt in lower extremities and some in his abdomen.  PCP wanted blood work but he told them he wanted cardiology to say if he needed it.  I told him to follow  PCP recommendations because he needs the blood work to check kidneys and electrolytes.  He is seeing them again tomorrow and will talk w them about blood work.  BP is elevated.  Stopped bisoprolol  after Thursday evening.  This morning BP was 176/119.  I asked him to restart bisoprolol  and take now instead of bedtime when he used to take.  He said he has to wait until around 5:00 pm because he has a meeting he needs to be awake for.     I adv of the recommendation that still stands as far as the ER evaluation and treatment today.  Also adv I will forward the message to [facility] for her review upon return to office.

## 2023-11-15 NOTE — Telephone Encounter (Signed)
 Called patient on spouse's phone.  He answered.  Message from APP relayed to him.   His response well that is the worst case scenario, what's the best case?   He is planning to follow up in PCP office tomorrow.

## 2023-11-15 NOTE — Telephone Encounter (Signed)
 Agree with recommendations from our nurses for patient to go to ER. Swelling is concerning. Heart function was normal on echo 11/2022 but it could have worsened. Swelling could also be secondary to right heart failure or his liver not functioning appropriately. He should avoid alcohol.  He should seek medical care as soon as possible. If he waits until the following day to see PCP, recommend he follow their recommendations for care. It is unlikely that the bisoprolol  is contributing to the swelling but he can continue to hold it if that is his desire. He will likely need additional medication for control of blood pressure.

## 2023-11-15 NOTE — Telephone Encounter (Signed)
 Pt c/o medication issue:  1. Name of Medication: Bisoprolol  Fumarate 2.5 MG TABS   2. How are you currently taking this medication (dosage and times per day)? As Written  3. Are you having a reaction (difficulty breathing--STAT)? No  4. What is your medication issue? Pt states that since starting mediation he has experienced leg swelling. Pt states that he has gained several pounds. Not having any SOB or any other symptoms. Please advise

## 2023-11-16 DIAGNOSIS — R635 Abnormal weight gain: Secondary | ICD-10-CM | POA: Diagnosis not present

## 2023-11-16 DIAGNOSIS — R600 Localized edema: Secondary | ICD-10-CM | POA: Diagnosis not present

## 2023-11-16 DIAGNOSIS — F17210 Nicotine dependence, cigarettes, uncomplicated: Secondary | ICD-10-CM | POA: Diagnosis not present

## 2023-11-16 DIAGNOSIS — J449 Chronic obstructive pulmonary disease, unspecified: Secondary | ICD-10-CM | POA: Diagnosis not present

## 2023-11-16 DIAGNOSIS — D649 Anemia, unspecified: Secondary | ICD-10-CM | POA: Diagnosis not present

## 2023-11-16 DIAGNOSIS — E871 Hypo-osmolality and hyponatremia: Secondary | ICD-10-CM | POA: Diagnosis not present

## 2023-11-16 DIAGNOSIS — R059 Cough, unspecified: Secondary | ICD-10-CM | POA: Diagnosis not present

## 2023-11-16 DIAGNOSIS — I10 Essential (primary) hypertension: Secondary | ICD-10-CM | POA: Diagnosis not present

## 2023-11-27 ENCOUNTER — Other Ambulatory Visit: Payer: Self-pay

## 2023-11-27 ENCOUNTER — Inpatient Hospital Stay (HOSPITAL_COMMUNITY)
Admission: EM | Admit: 2023-11-27 | Discharge: 2023-12-06 | DRG: 643 | Disposition: A | Attending: Internal Medicine | Admitting: Internal Medicine

## 2023-11-27 ENCOUNTER — Emergency Department (HOSPITAL_COMMUNITY)

## 2023-11-27 DIAGNOSIS — E222 Syndrome of inappropriate secretion of antidiuretic hormone: Principal | ICD-10-CM | POA: Diagnosis present

## 2023-11-27 DIAGNOSIS — R42 Dizziness and giddiness: Secondary | ICD-10-CM | POA: Diagnosis not present

## 2023-11-27 DIAGNOSIS — F17200 Nicotine dependence, unspecified, uncomplicated: Secondary | ICD-10-CM

## 2023-11-27 DIAGNOSIS — Z6823 Body mass index (BMI) 23.0-23.9, adult: Secondary | ICD-10-CM

## 2023-11-27 DIAGNOSIS — R0602 Shortness of breath: Secondary | ICD-10-CM | POA: Diagnosis not present

## 2023-11-27 DIAGNOSIS — F419 Anxiety disorder, unspecified: Secondary | ICD-10-CM | POA: Diagnosis present

## 2023-11-27 DIAGNOSIS — F32A Depression, unspecified: Secondary | ICD-10-CM | POA: Diagnosis present

## 2023-11-27 DIAGNOSIS — E785 Hyperlipidemia, unspecified: Secondary | ICD-10-CM | POA: Diagnosis present

## 2023-11-27 DIAGNOSIS — R062 Wheezing: Secondary | ICD-10-CM | POA: Diagnosis not present

## 2023-11-27 DIAGNOSIS — E871 Hypo-osmolality and hyponatremia: Secondary | ICD-10-CM | POA: Diagnosis not present

## 2023-11-27 DIAGNOSIS — J441 Chronic obstructive pulmonary disease with (acute) exacerbation: Secondary | ICD-10-CM

## 2023-11-27 DIAGNOSIS — I10 Essential (primary) hypertension: Secondary | ICD-10-CM | POA: Diagnosis present

## 2023-11-27 DIAGNOSIS — Z7951 Long term (current) use of inhaled steroids: Secondary | ICD-10-CM

## 2023-11-27 DIAGNOSIS — K029 Dental caries, unspecified: Secondary | ICD-10-CM | POA: Diagnosis present

## 2023-11-27 DIAGNOSIS — D509 Iron deficiency anemia, unspecified: Secondary | ICD-10-CM | POA: Diagnosis present

## 2023-11-27 DIAGNOSIS — R4182 Altered mental status, unspecified: Secondary | ICD-10-CM | POA: Diagnosis not present

## 2023-11-27 DIAGNOSIS — E43 Unspecified severe protein-calorie malnutrition: Secondary | ICD-10-CM | POA: Diagnosis present

## 2023-11-27 DIAGNOSIS — K219 Gastro-esophageal reflux disease without esophagitis: Secondary | ICD-10-CM | POA: Diagnosis present

## 2023-11-27 DIAGNOSIS — R739 Hyperglycemia, unspecified: Secondary | ICD-10-CM | POA: Diagnosis present

## 2023-11-27 DIAGNOSIS — G9341 Metabolic encephalopathy: Secondary | ICD-10-CM | POA: Diagnosis present

## 2023-11-27 DIAGNOSIS — F17210 Nicotine dependence, cigarettes, uncomplicated: Secondary | ICD-10-CM | POA: Diagnosis present

## 2023-11-27 DIAGNOSIS — Z79899 Other long term (current) drug therapy: Secondary | ICD-10-CM

## 2023-11-27 DIAGNOSIS — W19XXXA Unspecified fall, initial encounter: Secondary | ICD-10-CM | POA: Diagnosis present

## 2023-11-27 DIAGNOSIS — J439 Emphysema, unspecified: Secondary | ICD-10-CM | POA: Diagnosis present

## 2023-11-27 DIAGNOSIS — F10139 Alcohol abuse with withdrawal, unspecified: Secondary | ICD-10-CM | POA: Diagnosis not present

## 2023-11-27 LAB — BASIC METABOLIC PANEL WITH GFR
Anion gap: 15 (ref 5–15)
BUN: 12 mg/dL (ref 6–20)
CO2: 23 mmol/L (ref 22–32)
Calcium: 8.1 mg/dL — ABNORMAL LOW (ref 8.9–10.3)
Chloride: 67 mmol/L — ABNORMAL LOW (ref 98–111)
Creatinine, Ser: 1.07 mg/dL (ref 0.61–1.24)
GFR, Estimated: >60 mL/min (ref >60)
Glucose, Bld: 73 mg/dL (ref 70–99)
Potassium: 3.9 mmol/L (ref 3.5–5.1)
Sodium: 105 mmol/L — CL (ref 135–145)

## 2023-11-27 LAB — I-STAT CHEM 8, ED
BUN: 13 mg/dL (ref 6–20)
Calcium, Ion: 0.97 mmol/L — ABNORMAL LOW (ref 1.15–1.40)
Chloride: 68 mmol/L — ABNORMAL LOW (ref 98–111)
Creatinine, Ser: 1.3 mg/dL — ABNORMAL HIGH (ref 0.61–1.24)
Glucose, Bld: 73 mg/dL (ref 70–99)
HCT: 36 % — ABNORMAL LOW (ref 39.0–52.0)
Hemoglobin: 12.2 g/dL — ABNORMAL LOW (ref 13.0–17.0)
Potassium: 4 mmol/L (ref 3.5–5.1)
Sodium: 102 mmol/L — CL (ref 135–145)
TCO2: 25 mmol/L (ref 22–32)

## 2023-11-27 LAB — CBG MONITORING, ED: Glucose-Capillary: 76 mg/dL (ref 70–99)

## 2023-11-27 MED ORDER — THIAMINE MONONITRATE 100 MG PO TABS
50.0000 mg | ORAL_TABLET | Freq: Once | ORAL | Status: AC
  Administered 2023-11-27: 50 mg via ORAL
  Filled 2023-11-27: qty 1

## 2023-11-27 MED ORDER — ADULT MULTIVITAMIN W/MINERALS CH
1.0000 | ORAL_TABLET | Freq: Once | ORAL | Status: AC
  Administered 2023-11-27: 1 via ORAL
  Filled 2023-11-27: qty 1

## 2023-11-27 MED ORDER — NICOTINE 21 MG/24HR TD PT24
21.0000 mg | MEDICATED_PATCH | Freq: Once | TRANSDERMAL | Status: AC
  Administered 2023-11-27: 21 mg via TRANSDERMAL
  Filled 2023-11-27: qty 1

## 2023-11-27 MED ORDER — IPRATROPIUM-ALBUTEROL 0.5-2.5 (3) MG/3ML IN SOLN
3.0000 mL | Freq: Once | RESPIRATORY_TRACT | Status: AC
  Administered 2023-11-27: 3 mL via RESPIRATORY_TRACT
  Filled 2023-11-27: qty 3

## 2023-11-27 MED ORDER — METHYLPREDNISOLONE SODIUM SUCC 125 MG IJ SOLR
125.0000 mg | Freq: Once | INTRAMUSCULAR | Status: AC
  Administered 2023-11-27: 125 mg via INTRAVENOUS

## 2023-11-27 NOTE — ED Provider Notes (Signed)
 [place] EMERGENCY DEPARTMENT AT [facility] Provider Note   CSN: [id number] Arrival date & time: 11/27/23  2234     Patient presents with: Dizziness   Allen Wood is a 53 y.o. male.  {Add pertinent medical, surgical, social history, OB history to HPI:8146} 53 year old male presents to the ER with cough (productive with clear/white sputum) and SHOB. States he has been going to his [place] PCP office for same, had labs done and was called and told to go to the ER because his sodium was 119 (or 112 per triage note, patient isn't sure). He has been struggling with memory recently, feels off balance when he walks, although denies falls. He has been a long time drinker, typically drinks 4-7 beers on a weekday and a pint over the weekend. He did try to quit drinking once but had a seizure. He has thought about going through a program but realizes this requires time out of work and he is unable to afford taking off from work. He has tried to quit smoking, typically smokes 1 PPD, but states the chantix gum makes his feel strange, currently agreeable with nicotine  patch.        Prior to Admission medications   Medication Sig Start Date End Date Taking? Authorizing Provider  albuterol  (PROVENTIL  HFA;VENTOLIN  HFA) 108 (90 Base) MCG/ACT inhaler Inhale 2 puffs into the lungs every 6 (six) hours as needed for wheezing or shortness of breath.    [provider]  Bisoprolol  Fumarate 2.5 MG TABS Take 2.5 mg by mouth daily at 8 pm. 10/22/23   Swinyer, Rosaline HERO, NP  budesonide-formoterol (SYMBICORT) 160-4.5 MCG/ACT inhaler Inhale 2 puffs into the lungs 2 (two) times daily.    [provider]  Ensure (ENSURE) Take 1 Can by mouth.    [provider]  ferrous sulfate  324 MG TBEC Take 324 mg by mouth. Tries taking 1 tab daily. Patient taking differently: Take 324 mg by mouth every other day. Tries taking 1 tab daily.    [provider]  ibuprofen (ADVIL) 400  MG tablet Take 400 mg by mouth 3 (three) times daily.    [provider]  loratadine (CLARITIN) 10 MG tablet Take 10 mg by mouth daily.    [provider]  losartan (COZAAR) 100 MG tablet Take 100 mg by mouth daily.    [provider]  montelukast (SINGULAIR) 10 MG tablet Take 10 mg by mouth at bedtime.    [provider]  pantoprazole  (PROTONIX ) 40 MG tablet Take 40 mg by mouth daily. 01/01/23   [provider]  rosuvastatin  (CRESTOR ) 5 MG tablet Take 1 tablet (5 mg total) by mouth daily. 12/22/22 10/04/23  Swinyer, Rosaline HERO, NP    Allergies: Amlodipine  and Citalopram hydrobromide    Review of Systems Negative except as per HPI Updated Vital Signs BP (!) 137/97 (BP Location: Right Arm)   Pulse 77   Temp 97.8 F (36.6 C) (Oral)   Resp 16   Ht 5' 11 (1.803 m)   Wt 77.1 kg   SpO2 99%   BMI 23.71 kg/m   Physical Exam Vitals and nursing note reviewed.  Constitutional:      General: He is not in acute distress.    Appearance: He is well-developed. He is not diaphoretic.  HENT:     Head: Normocephalic and atraumatic.     Mouth/Throat:     Mouth: Mucous membranes are dry.  Eyes:     Conjunctiva/sclera:  Conjunctivae normal.  Cardiovascular:     Rate and Rhythm: Normal rate and regular rhythm.     Pulses: Normal pulses.     Heart sounds: Normal heart sounds.  Pulmonary:     Effort: Pulmonary effort is normal.     Breath sounds: Decreased breath sounds and wheezing present.  Abdominal:     Palpations: Abdomen is soft.     Tenderness: There is no abdominal tenderness.  Musculoskeletal:     Cervical back: Neck supple.     Right lower leg: No edema.     Left lower leg: No edema.  Skin:    General: Skin is warm and dry.     Findings: No erythema or rash.  Neurological:     Mental Status: He is alert and oriented to person, place, and time.  Psychiatric:        Behavior: Behavior normal.     (all labs ordered are listed, but  only abnormal results are displayed) Labs Reviewed  I-STAT CHEM 8, ED - Abnormal; Notable for the following components:      Result Value   Sodium 102 (*)    Chloride 68 (*)    Creatinine, Ser 1.30 (*)    Calcium , Ion 0.97 (*)    Hemoglobin 12.2 (*)    HCT 36.0 (*)    All other components within normal limits  RESP PANEL BY RT-PCR (RSV, FLU A&B, COVID)  RVPGX2  CBC  URINALYSIS, ROUTINE W REFLEX MICROSCOPIC  BASIC METABOLIC PANEL WITH GFR  BRAIN NATRIURETIC PEPTIDE  CBG MONITORING, ED    EKG: None  Radiology: No results found.  {Document cardiac monitor, telemetry assessment procedure when appropriate:32947} Procedures   Medications Ordered in the ED  methylPREDNISolone  sodium succinate  (SOLU-MEDROL ) 125 mg/2 mL injection 125 mg (has no administration in time range)  ipratropium-albuterol  (DUONEB) 0.5-2.5 (3) MG/3ML nebulizer solution 3 mL (has no administration in time range)  nicotine  (NICODERM CQ  - dosed in mg/24 hours) patch 21 mg (has no administration in time range)  thiamine  (VITAMIN B1) tablet 50 mg (has no administration in time range)  multivitamin with minerals tablet 1 tablet (has no administration in time range)      {Click here for ABCD2, HEART and other calculators REFRESH Note before signing:1}                              Medical Decision Making Amount and/or Complexity of Data Reviewed Labs: ordered. Radiology: ordered.  Risk OTC drugs. Prescription drug management.   This patient presents to the ED for concern of ***, this involves an extensive number of treatment options, and is a complaint that carries with it a high risk of complications and morbidity.  The differential diagnosis includes ***   Co morbidities / Chronic conditions that complicate the patient evaluation  ***   Additional history obtained:  Additional history obtained from EMR External records from outside source obtained and reviewed including ***   Lab Tests:  I  Ordered, and personally interpreted labs.  The pertinent results include:  ***   Imaging Studies ordered:  I ordered imaging studies including ***  I independently visualized and interpreted imaging which showed *** I agree with the radiologist interpretation   Cardiac Monitoring: / EKG:  The patient was maintained on a cardiac monitor.  I personally viewed and interpreted the cardiac monitored which showed an underlying rhythm of: ***   Problem List / ED Course /  Critical interventions / Medication management  *** I ordered medication including ***   Reevaluation of the patient after these medicines showed that the patient *** I have reviewed the patients home medicines and have made adjustments as needed   Consultations Obtained:  I requested consultation with the ***,  and discussed lab and imaging findings as well as pertinent plan - they recommend: ***   Social Determinants of Health:  ***   Test / Admission - Considered:  ***   {Document critical care time when appropriate  Document review of labs and clinical decision tools ie CHADS2VASC2, etc  Document your independent review of radiology images and any outside records  Document your discussion with family members, caretakers and with consultants  Document social determinants of health affecting pt's care  Document your decision making why or why not admission, treatments were needed:32947:::1}   Final diagnoses:  None    ED Discharge Orders     None

## 2023-11-27 NOTE — ED Notes (Signed)
 Patient transported to CT

## 2023-11-27 NOTE — ED Triage Notes (Signed)
 Patient BIB [facility] from home due to dizziness and abnormal lab. Patient states dizziness has been going on for a week. Seen by PCP at beginning of week and had labs drawn. Lab results showed a Sodium of 112 per the patient. Patient endorses sciatica pain, but that is not new. VSS en route A&Ox4.

## 2023-11-28 ENCOUNTER — Encounter (HOSPITAL_COMMUNITY): Payer: Self-pay

## 2023-11-28 DIAGNOSIS — F10139 Alcohol abuse with withdrawal, unspecified: Secondary | ICD-10-CM | POA: Diagnosis not present

## 2023-11-28 DIAGNOSIS — F419 Anxiety disorder, unspecified: Secondary | ICD-10-CM | POA: Diagnosis not present

## 2023-11-28 DIAGNOSIS — J449 Chronic obstructive pulmonary disease, unspecified: Secondary | ICD-10-CM

## 2023-11-28 DIAGNOSIS — E43 Unspecified severe protein-calorie malnutrition: Secondary | ICD-10-CM | POA: Diagnosis not present

## 2023-11-28 DIAGNOSIS — F1090 Alcohol use, unspecified, uncomplicated: Secondary | ICD-10-CM | POA: Diagnosis not present

## 2023-11-28 DIAGNOSIS — E871 Hypo-osmolality and hyponatremia: Secondary | ICD-10-CM | POA: Diagnosis not present

## 2023-11-28 DIAGNOSIS — G9341 Metabolic encephalopathy: Secondary | ICD-10-CM

## 2023-11-28 DIAGNOSIS — E222 Syndrome of inappropriate secretion of antidiuretic hormone: Secondary | ICD-10-CM | POA: Diagnosis not present

## 2023-11-28 DIAGNOSIS — I10 Essential (primary) hypertension: Secondary | ICD-10-CM | POA: Diagnosis not present

## 2023-11-28 DIAGNOSIS — F32A Depression, unspecified: Secondary | ICD-10-CM | POA: Diagnosis not present

## 2023-11-28 DIAGNOSIS — R42 Dizziness and giddiness: Secondary | ICD-10-CM | POA: Diagnosis not present

## 2023-11-28 DIAGNOSIS — K029 Dental caries, unspecified: Secondary | ICD-10-CM | POA: Diagnosis not present

## 2023-11-28 DIAGNOSIS — J441 Chronic obstructive pulmonary disease with (acute) exacerbation: Secondary | ICD-10-CM | POA: Diagnosis not present

## 2023-11-28 DIAGNOSIS — Z79899 Other long term (current) drug therapy: Secondary | ICD-10-CM | POA: Diagnosis not present

## 2023-11-28 DIAGNOSIS — Z7951 Long term (current) use of inhaled steroids: Secondary | ICD-10-CM | POA: Diagnosis not present

## 2023-11-28 DIAGNOSIS — W19XXXA Unspecified fall, initial encounter: Secondary | ICD-10-CM | POA: Diagnosis present

## 2023-11-28 DIAGNOSIS — R0602 Shortness of breath: Secondary | ICD-10-CM | POA: Diagnosis not present

## 2023-11-28 DIAGNOSIS — K219 Gastro-esophageal reflux disease without esophagitis: Secondary | ICD-10-CM | POA: Diagnosis not present

## 2023-11-28 DIAGNOSIS — F1010 Alcohol abuse, uncomplicated: Secondary | ICD-10-CM | POA: Diagnosis not present

## 2023-11-28 DIAGNOSIS — Z6823 Body mass index (BMI) 23.0-23.9, adult: Secondary | ICD-10-CM | POA: Diagnosis not present

## 2023-11-28 DIAGNOSIS — K047 Periapical abscess without sinus: Secondary | ICD-10-CM | POA: Diagnosis not present

## 2023-11-28 DIAGNOSIS — J439 Emphysema, unspecified: Secondary | ICD-10-CM | POA: Diagnosis not present

## 2023-11-28 DIAGNOSIS — D509 Iron deficiency anemia, unspecified: Secondary | ICD-10-CM | POA: Diagnosis not present

## 2023-11-28 DIAGNOSIS — R9431 Abnormal electrocardiogram [ECG] [EKG]: Secondary | ICD-10-CM | POA: Diagnosis not present

## 2023-11-28 DIAGNOSIS — R739 Hyperglycemia, unspecified: Secondary | ICD-10-CM | POA: Diagnosis not present

## 2023-11-28 DIAGNOSIS — F17210 Nicotine dependence, cigarettes, uncomplicated: Secondary | ICD-10-CM

## 2023-11-28 DIAGNOSIS — E785 Hyperlipidemia, unspecified: Secondary | ICD-10-CM | POA: Diagnosis not present

## 2023-11-28 LAB — RESP PANEL BY RT-PCR (RSV, FLU A&B, COVID)  RVPGX2
Influenza A by PCR: NEGATIVE
Influenza B by PCR: NEGATIVE
Resp Syncytial Virus by PCR: NEGATIVE
SARS Coronavirus 2 by RT PCR: NEGATIVE

## 2023-11-28 LAB — GLUCOSE, CAPILLARY
Glucose-Capillary: 115 mg/dL — ABNORMAL HIGH (ref 70–99)
Glucose-Capillary: 134 mg/dL — ABNORMAL HIGH (ref 70–99)
Glucose-Capillary: 150 mg/dL — ABNORMAL HIGH (ref 70–99)
Glucose-Capillary: 394 mg/dL — ABNORMAL HIGH (ref 70–99)
Glucose-Capillary: 190 mg/dL — ABNORMAL HIGH (ref 70–99)
Glucose-Capillary: 59 mg/dL — ABNORMAL LOW (ref 70–99)
Glucose-Capillary: 64 mg/dL — ABNORMAL LOW (ref 70–99)

## 2023-11-28 LAB — OSMOLALITY
Osmolality: 233 mosm/kg — CL (ref 275–295)
Osmolality: 224 mosm/kg — CL (ref 275–295)

## 2023-11-28 LAB — URINALYSIS, ROUTINE W REFLEX MICROSCOPIC
Bilirubin Urine: NEGATIVE
Glucose, UA: NEGATIVE mg/dL
Ketones, ur: NEGATIVE mg/dL
Leukocytes,Ua: NEGATIVE
Nitrite: NEGATIVE
Protein, ur: >=300 mg/dL — AB
Specific Gravity, Urine: 1.005 (ref 1.005–1.030)
pH: 6 (ref 5.0–8.0)

## 2023-11-28 LAB — AMMONIA: Ammonia: 23 umol/L (ref 9–35)

## 2023-11-28 LAB — BASIC METABOLIC PANEL WITH GFR
Anion gap: 13 (ref 5–15)
BUN: 14 mg/dL (ref 6–20)
CO2: 25 mmol/L (ref 22–32)
Calcium: 8.2 mg/dL — ABNORMAL LOW (ref 8.9–10.3)
Chloride: 73 mmol/L — ABNORMAL LOW (ref 98–111)
Creatinine, Ser: 1.08 mg/dL (ref 0.61–1.24)
GFR, Estimated: >60 mL/min (ref >60)
Glucose, Bld: 117 mg/dL — ABNORMAL HIGH (ref 70–99)
Potassium: 4.7 mmol/L (ref 3.5–5.1)
Sodium: 111 mmol/L — CL (ref 135–145)

## 2023-11-28 LAB — SODIUM
Sodium: 106 mmol/L — CL (ref 135–145)
Sodium: 107 mmol/L — CL (ref 135–145)
Sodium: 109 mmol/L — CL (ref 135–145)
Sodium: 109 mmol/L — CL (ref 135–145)
Sodium: 112 mmol/L — CL (ref 135–145)

## 2023-11-28 LAB — SODIUM, URINE, RANDOM
Sodium, Ur: <30 mmol/L
Sodium, Ur: <30 mmol/L

## 2023-11-28 LAB — PHOSPHORUS
Phosphorus: 3.8 mg/dL (ref 2.5–4.6)
Phosphorus: 5.3 mg/dL — ABNORMAL HIGH (ref 2.5–4.6)

## 2023-11-28 LAB — HEPATIC FUNCTION PANEL
ALT: 26 U/L (ref 0–44)
AST: 70 U/L — ABNORMAL HIGH (ref 15–41)
Albumin: 2.9 g/dL — ABNORMAL LOW (ref 3.5–5.0)
Alkaline Phosphatase: 66 U/L (ref 38–126)
Bilirubin, Direct: 0.2 mg/dL (ref 0.0–0.2)
Indirect Bilirubin: 0.3 mg/dL (ref 0.3–0.9)
Total Bilirubin: 0.5 mg/dL (ref 0.0–1.2)
Total Protein: 5.7 g/dL — ABNORMAL LOW (ref 6.5–8.1)

## 2023-11-28 LAB — CBC
HCT: 31.3 % — ABNORMAL LOW (ref 39.0–52.0)
HCT: 31 % — ABNORMAL LOW (ref 39.0–52.0)
Hemoglobin: 11.7 g/dL — ABNORMAL LOW (ref 13.0–17.0)
Hemoglobin: 11.9 g/dL — ABNORMAL LOW (ref 13.0–17.0)
MCH: 32.5 pg (ref 26.0–34.0)
MCH: 32.7 pg (ref 26.0–34.0)
MCHC: 37.4 g/dL — ABNORMAL HIGH (ref 30.0–36.0)
MCHC: 38.4 g/dL — ABNORMAL HIGH (ref 30.0–36.0)
MCV: 86.9 fL (ref 80.0–100.0)
MCV: 85.2 fL (ref 80.0–100.0)
Platelets: 277 K/uL (ref 150–400)
Platelets: 269 K/uL (ref 150–400)
RBC: 3.6 MIL/uL — ABNORMAL LOW (ref 4.22–5.81)
RBC: 3.64 MIL/uL — ABNORMAL LOW (ref 4.22–5.81)
RDW: 12 % (ref 11.5–15.5)
RDW: 11.7 % (ref 11.5–15.5)
WBC: 5.7 K/uL (ref 4.0–10.5)
WBC: 2.9 K/uL — ABNORMAL LOW (ref 4.0–10.5)
nRBC: 0 % (ref 0.0–0.2)
nRBC: 0 % (ref 0.0–0.2)

## 2023-11-28 LAB — CBG MONITORING, ED: Glucose-Capillary: 94 mg/dL (ref 70–99)

## 2023-11-28 LAB — OSMOLALITY, URINE
Osmolality, Ur: 190 mosm/kg — ABNORMAL LOW (ref 300–900)
Osmolality, Ur: 110 mosm/kg — ABNORMAL LOW (ref 300–900)

## 2023-11-28 LAB — HIV ANTIBODY (ROUTINE TESTING W REFLEX): HIV Screen 4th Generation wRfx: NONREACTIVE

## 2023-11-28 LAB — RAPID URINE DRUG SCREEN, HOSP PERFORMED
Amphetamines: NOT DETECTED
Barbiturates: NOT DETECTED
Benzodiazepines: NOT DETECTED
Cocaine: NOT DETECTED
Opiates: NOT DETECTED
Tetrahydrocannabinol: NOT DETECTED

## 2023-11-28 LAB — ETHANOL: Alcohol, Ethyl (B): <15 mg/dL (ref <15)

## 2023-11-28 LAB — HEMOGLOBIN A1C
Hgb A1c MFr Bld: 4.6 % — ABNORMAL LOW (ref 4.8–5.6)
Mean Plasma Glucose: 85.32 mg/dL

## 2023-11-28 LAB — BRAIN NATRIURETIC PEPTIDE: B Natriuretic Peptide: 254.1 pg/mL — ABNORMAL HIGH (ref 0.0–100.0)

## 2023-11-28 LAB — MAGNESIUM
Magnesium: 1.3 mg/dL — ABNORMAL LOW (ref 1.7–2.4)
Magnesium: 2 mg/dL (ref 1.7–2.4)

## 2023-11-28 LAB — MRSA NEXT GEN BY PCR, NASAL: MRSA by PCR Next Gen: NOT DETECTED

## 2023-11-28 MED ORDER — DOCUSATE SODIUM 100 MG PO CAPS: 100.0000 mg | ORAL_CAPSULE | Freq: Two times a day (BID) | ORAL | Status: DC | PRN

## 2023-11-28 MED ORDER — THIAMINE MONONITRATE 100 MG PO TABS
100.0000 mg | ORAL_TABLET | ORAL | Status: DC
  Administered 2023-11-28 – 2023-12-06 (×9): 100 mg via ORAL
  Filled 2023-11-28 (×9): qty 1

## 2023-11-28 MED ORDER — INSULIN ASPART 100 UNIT/ML IJ SOLN
0.0000 [IU] | INTRAMUSCULAR | Status: DC
  Administered 2023-11-28: 15 [IU] via SUBCUTANEOUS
  Administered 2023-11-28: 3 [IU] via SUBCUTANEOUS
  Administered 2023-11-29 (×2): 2 [IU] via SUBCUTANEOUS
  Administered 2023-11-30 (×2): 3 [IU] via SUBCUTANEOUS
  Administered 2023-11-30 – 2023-12-01 (×2): 2 [IU] via SUBCUTANEOUS
  Administered 2023-12-01: 5 [IU] via SUBCUTANEOUS
  Administered 2023-12-01 (×2): 2 [IU] via SUBCUTANEOUS
  Administered 2023-12-02: 3 [IU] via SUBCUTANEOUS
  Administered 2023-12-02 – 2023-12-06 (×5): 2 [IU] via SUBCUTANEOUS
  Filled 2023-11-28: qty 2
  Filled 2023-11-28: qty 5
  Filled 2023-11-28 (×4): qty 2
  Filled 2023-11-28: qty 3
  Filled 2023-11-28 (×4): qty 2

## 2023-11-28 MED ORDER — ROSUVASTATIN CALCIUM 5 MG PO TABS
5.0000 mg | ORAL_TABLET | Freq: Every day | ORAL | Status: DC
  Administered 2023-11-28 – 2023-12-06 (×9): 5 mg via ORAL
  Filled 2023-11-28 (×9): qty 1

## 2023-11-28 MED ORDER — MAGNESIUM SULFATE 4 GM/100ML IV SOLN
4.0000 g | Freq: Once | INTRAVENOUS | Status: DC
  Filled 2023-11-28: qty 100

## 2023-11-28 MED ORDER — THIAMINE HCL 100 MG/ML IJ SOLN
200.0000 mg | INTRAVENOUS | Status: DC
  Filled 2023-11-28 (×9): qty 2

## 2023-11-28 MED ORDER — PANTOPRAZOLE SODIUM 40 MG PO TBEC
40.0000 mg | DELAYED_RELEASE_TABLET | Freq: Every day | ORAL | Status: DC
  Administered 2023-11-28 – 2023-12-06 (×9): 40 mg via ORAL
  Filled 2023-11-28 (×9): qty 1

## 2023-11-28 MED ORDER — FUROSEMIDE 10 MG/ML IJ SOLN
40.0000 mg | Freq: Once | INTRAMUSCULAR | Status: DC
Start: 2023-11-28 — End: 2023-11-28

## 2023-11-28 MED ORDER — PHENOBARBITAL 32.4 MG PO TABS
32.4000 mg | ORAL_TABLET | Freq: Three times a day (TID) | ORAL | Status: AC
Start: 2023-12-02 — End: 2023-12-04
  Administered 2023-12-02 – 2023-12-04 (×6): 32.4 mg via ORAL
  Filled 2023-11-28 (×6): qty 1

## 2023-11-28 MED ORDER — HEPARIN SODIUM (PORCINE) 5000 UNIT/ML IJ SOLN
5000.0000 [IU] | Freq: Three times a day (TID) | INTRAMUSCULAR | Status: DC
  Administered 2023-11-28 – 2023-12-06 (×24): 5000 [IU] via SUBCUTANEOUS
  Filled 2023-11-28 (×24): qty 1

## 2023-11-28 MED ORDER — LORAZEPAM 2 MG/ML IJ SOLN
1.0000 mg | INTRAMUSCULAR | Status: DC | PRN
  Administered 2023-11-29: 1 mg via INTRAVENOUS
  Filled 2023-11-28: qty 1

## 2023-11-28 MED ORDER — LOSARTAN POTASSIUM 25 MG PO TABS
100.0000 mg | ORAL_TABLET | Freq: Every day | ORAL | Status: DC
  Administered 2023-11-28: 100 mg via ORAL
  Filled 2023-11-28: qty 4

## 2023-11-28 MED ORDER — FERROUS SULFATE 325 (65 FE) MG PO TABS
325.0000 mg | ORAL_TABLET | Freq: Every day | ORAL | Status: DC
  Administered 2023-11-29 – 2023-12-06 (×8): 325 mg via ORAL
  Filled 2023-11-28 (×8): qty 1

## 2023-11-28 MED ORDER — MAGNESIUM SULFATE 2 GM/50ML IV SOLN
2.0000 g | Freq: Once | INTRAVENOUS | Status: AC
  Administered 2023-11-28: 2 g via INTRAVENOUS
  Filled 2023-11-28: qty 50

## 2023-11-28 MED ORDER — FLUTICASONE FUROATE-VILANTEROL 200-25 MCG/ACT IN AEPB
1.0000 | INHALATION_SPRAY | Freq: Every day | RESPIRATORY_TRACT | Status: DC
  Administered 2023-11-28 – 2023-11-29 (×2): 1 via RESPIRATORY_TRACT
  Filled 2023-11-28: qty 28

## 2023-11-28 MED ORDER — POLYETHYLENE GLYCOL 3350 17 G PO PACK: 17.0000 g | PACK | Freq: Every day | ORAL | Status: DC | PRN

## 2023-11-28 MED ORDER — MONTELUKAST SODIUM 10 MG PO TABS
10.0000 mg | ORAL_TABLET | Freq: Every day | ORAL | Status: DC
  Administered 2023-11-28 – 2023-12-05 (×8): 10 mg via ORAL
  Filled 2023-11-28 (×8): qty 1

## 2023-11-28 MED ORDER — PHENOBARBITAL 32.4 MG PO TABS
64.8000 mg | ORAL_TABLET | Freq: Three times a day (TID) | ORAL | Status: AC
  Administered 2023-11-30 – 2023-12-02 (×6): 64.8 mg via ORAL
  Filled 2023-11-28 (×6): qty 2

## 2023-11-28 MED ORDER — FOLIC ACID 1 MG PO TABS
1.0000 mg | ORAL_TABLET | Freq: Every day | ORAL | Status: DC
  Filled 2023-11-28: qty 1

## 2023-11-28 MED ORDER — ADULT MULTIVITAMIN W/MINERALS CH
1.0000 | ORAL_TABLET | Freq: Every day | ORAL | Status: DC
  Administered 2023-11-28 – 2023-12-06 (×9): 1 via ORAL
  Filled 2023-11-28 (×9): qty 1

## 2023-11-28 MED ORDER — FOLIC ACID 1 MG PO TABS
1.0000 mg | ORAL_TABLET | Freq: Every day | ORAL | Status: DC
  Administered 2023-11-28 – 2023-12-06 (×9): 1 mg via ORAL
  Filled 2023-11-28 (×8): qty 1

## 2023-11-28 MED ORDER — CHLORHEXIDINE GLUCONATE CLOTH 2 % EX PADS
6.0000 | MEDICATED_PAD | Freq: Every day | CUTANEOUS | Status: DC
  Administered 2023-11-28 – 2023-12-06 (×9): 6 via TOPICAL

## 2023-11-28 MED ORDER — PHENOBARBITAL 32.4 MG PO TABS
97.2000 mg | ORAL_TABLET | Freq: Three times a day (TID) | ORAL | Status: AC
Start: 2023-11-28 — End: 2023-11-30
  Administered 2023-11-28 – 2023-11-30 (×6): 97.2 mg via ORAL
  Filled 2023-11-28 (×6): qty 3

## 2023-11-28 MED ORDER — MAGNESIUM SULFATE 2 GM/50ML IV SOLN
2.0000 g | Freq: Once | INTRAVENOUS | Status: AC
Start: 2023-11-28 — End: 2023-11-28
  Administered 2023-11-28: 2 g via INTRAVENOUS
  Filled 2023-11-28: qty 50

## 2023-11-28 MED ORDER — SODIUM CHLORIDE 3 % IV SOLN
INTRAVENOUS | Status: DC
  Filled 2023-11-28: qty 500

## 2023-11-28 MED ORDER — DEXTROSE 5 % IV BOLUS
500.0000 mL | Freq: Once | INTRAVENOUS | Status: AC
  Administered 2023-11-28: 500 mL via INTRAVENOUS

## 2023-11-28 NOTE — H&P (Signed)
 NAME:  Allen Wood, MRN:  [id number], DOB:  1970-06-07, LOS: 0 ADMISSION DATE:  11/27/2023, CONSULTATION DATE:  11/2  REFERRING: Leita Chancy PA   CHIEF COMPLAINT: Dizziness   History of Present Illness:  Patient is a 53 yr old male with past medical hx significant of etoh abuse, seizures due to ETOH withdrawal in 2016-2017, HTN, HLD, anemia-IDA, COPD, tobacco abuse, anxiety, and depression who presents to [facility] ED due to by EMS due to dizziness that has been ongoing for a week. Patient also endorsed that he had seen PCP earlier Upon initial lab work up, CT of head negative but patient noted to have severe hyponatremia. PCCM consulted for ICU admission in setting of severe hyponatremia- Na 102, requiring 3% saline and frequent neurochecks.   Upon, initial assessment. Patient drowsy but able to answer questions. Patient states that his last drink of alcohol was early this afternoon. Patient had consumed  pint of bourbon. Patient states usually drinks bourbon on the weekends, at least  pint of bourbon daily on weekend days. During the week, he states he drinks mainly beer but unable to provide quantity. Patient mentions that he did try to quit once and was admitted for withdrawals and having seizure in relation to. He believes this was anywhere from 5-10 yrs ago. Patient not taking anything for seizures. Patient endorses that he smokes almost 1pack of cigarettes a day but could not tell over how many years ('per patient since he was young'). Patient endorses an episode of vomiting 3 days ago but has not happened since. Patient denies any bleeding (hemoptysis, hematemesis, hematochezia), sick contacts, or recent falls. Patient admits that he lives with his wife and son.   Pertinent  Medical History   Past Medical History:  Diagnosis Date   Alcohol abuse    Anemia    Anxiety    Aortic atherosclerosis    Chronic bronchitis (HCC)    COPD with emphysema (HCC)    Delirium tremens (HCC)    Depression     Elevated blood pressure reading without diagnosis of hypertension    Elevated LFTs    Emphysema, unspecified (HCC)    History of COVID-19    Hyponatremia    Insomnia    Pneumonia 2016   septic shock, trach/PEG/chest tube, both resolved   Seasonal allergic rhinitis    Seizures (HCC)    while intubated with PNA in 2016; post-ETOH in 2017   Tobacco dependence      Significant Hospital Events: Including procedures, antibiotic start and stop dates in addition to other pertinent events   11/2 PCCM admit for severe hyponatremia   Interim History / Subjective:  Drowsy, able to follow commands   Oriented x 3   Dizziness resolving   Objective    Blood pressure (!) 148/89, pulse 82, temperature 97.8 F (36.6 C), temperature source Oral, resp. rate (!) 22, height 5' 11 (1.803 m), weight 77.1 kg, SpO2 98%.       No intake or output data in the 24 hours ending 11/28/23 0232 Filed Weights   11/27/23 2238  Weight: 77.1 kg    Examination: General: acute on chronic adult male, drowsy, but not in NAD HEENT: Normocephalic, PERRLA intact, teeth intact/poor dentition, pink mm  Pulm: clear, diminished, no distress, on RA CV: s1, s2, RRR, no JVD, No MRG  Abs: soft, bs active Skin: no rash  Extremities: moves all extremities to commands, no deformity, non pitting edema lower extremities b/L  Neuro: Drowsy, oriented x  3, disoriented to time, follows commands, no signs of ETOH withdrawals at this time  GU: deferred  Resolved problem list   Assessment and Plan  Hyponatremia suspect in setting of ETOH abuse  Beer Potomania  Na 102 (original per istat) >105>106  Urine Osm 190, serum osmo 233, Sodium < 30  P:  Admit to ICU, for frequent neuro checks  Started on 3% hypertonic saline in emergency department, continue Na, Q2 Seizure precautions Aspiration precautions  Avoid over correction  No more than 8-53mmol/L within the first 24hrs   Acute Metabolic Enceph secondary to  Hyponatremia and ETOH abuse CT head negative  P: Continue trx for hyponatremia above  Continue CIWA protocol  Delirium precautions  Limit sedating medications  Check ammonia Urine tox Ethanol level   ETOH abuse Hx of Seizures in setting of withdrawals  Per patient hx of seizures 5-10 yrs ago in setting of withdrawal  P: CIWA assessment  Monitor in ICU, high risk for detox Seizure precautions Order thiamine , folic acid , and multivitamin  Due to drowsiness, hold starting ativan  or phenobarb taper  - patient not showing signs of withdrawal at this time, last drink within the last 12-14hrs   COPD  Tobacco Abuse: On Symbicort, singular  Albuterol  PRN P: Recommend smoking Cessation  Nicotine  patch  Albuterol  PRN Breo elipta inhaler once daily  Restart singular   Elevated BNP  Biatrial enlargement noted on EKG  Probable element of CHF secondary to ETOH use/HTN P: Obtain ECHO   HTN: P: Restart home losartan   HLD P: Resume statin   IDA P: Continue Iron   Anxiety Depression P:  On no home medications Continue supportive care   Labs   CBC: Recent Labs  Lab 11/27/23 2247 11/27/23 2255  WBC 5.7  --   HGB 11.7* 12.2*  HCT 31.3* 36.0*  MCV 86.9  --   PLT 277  --     Basic Metabolic Panel: Recent Labs  Lab 11/27/23 2255 11/27/23 2318 11/28/23 0028  NA 102* 105* 106*  K 4.0 3.9  --   CL 68* 67*  --   CO2  --  23  --   GLUCOSE 73 73  --   BUN 13 12  --   CREATININE 1.30* 1.07  --   CALCIUM   --  8.1*  --    GFR: Estimated Creatinine Clearance: 83.1 mL/min (by C-G formula based on SCr of 1.07 mg/dL). Recent Labs  Lab 11/27/23 2247  WBC 5.7    Liver Function Tests: No results for input(s): AST, ALT, ALKPHOS, BILITOT, PROT, ALBUMIN in the last 168 hours. No results for input(s): LIPASE, AMYLASE in the last 168 hours. No results for input(s): AMMONIA in the last 168 hours.  ABG    Component Value Date/Time   PHART 7.417  01/31/2014 1041   PCO2ART 34.1 (L) 01/31/2014 1041   PO2ART 40.0 (L) 01/31/2014 1041   HCO3 23.0 01/31/2014 1041   TCO2 25 11/27/2023 2255   ACIDBASEDEF 2.0 01/31/2014 1041   O2SAT 61.8 01/31/2014 1112     Coagulation Profile: No results for input(s): INR, PROTIME in the last 168 hours.  Cardiac Enzymes: No results for input(s): CKTOTAL, CKMB, CKMBINDEX, TROPONINI in the last 168 hours.  HbA1C: No results found for: HGBA1C  CBG: Recent Labs  Lab 11/27/23 2243  GLUCAP 76    Review of Systems:   Review of Systems  Constitutional: Negative.   HENT: Negative.    Eyes: Negative.   Respiratory:  Negative.    Cardiovascular: Negative.   Gastrointestinal: Negative.   Genitourinary: Negative.   Musculoskeletal: Negative.   Skin: Negative.   Neurological:  Positive for dizziness.  Endo/Heme/Allergies: Negative.   Psychiatric/Behavioral: Negative.       Past Medical History:  He,  has a past medical history of Alcohol abuse, Anemia, Anxiety, Aortic atherosclerosis, Chronic bronchitis (HCC), COPD with emphysema (HCC), Delirium tremens (HCC), Depression, Elevated blood pressure reading without diagnosis of hypertension, Elevated LFTs, Emphysema, unspecified (HCC), History of COVID-19, Hyponatremia, Insomnia, Pneumonia (2016), Seasonal allergic rhinitis, Seizures (HCC), and Tobacco dependence.   Surgical History:   Past Surgical History:  Procedure Laterality Date   CYSTECTOMY Right 2024   RIGHT SHOULDER   ESOPHAGOGASTRODUODENOSCOPY N/A 02/03/2014   Procedure: ESOPHAGOGASTRODUODENOSCOPY (EGD);  Surgeon: Gwendlyn ONEIDA Buddy, MD;  Location[facility] ENDOSCOPY;  Service: Endoscopy;  Laterality: N/A;  bedside/trach/vent   MOUTH SURGERY     teeth removed.   PEG PLACEMENT N/A 02/03/2014   Procedure: PERCUTANEOUS ENDOSCOPIC GASTROSTOMY (PEG) PLACEMENT;  Surgeon: Gwendlyn ONEIDA Buddy, MD;  Location: [facility] ENDOSCOPY;  Service: Endoscopy;  Laterality: N/A;   SEPARATED LEFT SHOULDER      TRACHEOSTOMY  01/30/2014   [facility]     Social History:   reports that he has been smoking cigarettes. He started smoking about 29 years ago. He has a 20 pack-year smoking history. He has never used smokeless tobacco. He reports current alcohol use. He reports that he does not use drugs.   Family History:  His family history includes Cancer in his father and mother.   Allergies Allergies  Allergen Reactions   Amlodipine  Swelling    Significant leg swelling   Citalopram Hydrobromide     MADE ME FEEL HIGH     Home Medications  Prior to Admission medications   Medication Sig Start Date End Date Taking? Authorizing Provider  albuterol  (PROVENTIL  HFA;VENTOLIN  HFA) 108 (90 Base) MCG/ACT inhaler Inhale 2 puffs into the lungs every 6 (six) hours as needed for wheezing or shortness of breath.    [provider]  Bisoprolol  Fumarate 2.5 MG TABS Take 2.5 mg by mouth daily at 8 pm. 10/22/23   Swinyer, Rosaline HERO, NP  budesonide-formoterol (SYMBICORT) 160-4.5 MCG/ACT inhaler Inhale 2 puffs into the lungs 2 (two) times daily.    [provider]  Ensure (ENSURE) Take 1 Can by mouth.    [provider]  ferrous sulfate  324 MG TBEC Take 324 mg by mouth. Tries taking 1 tab daily. Patient taking differently: Take 324 mg by mouth every other day. Tries taking 1 tab daily.    [provider]  ibuprofen (ADVIL) 400 MG tablet Take 400 mg by mouth 3 (three) times daily.    [provider]  loratadine (CLARITIN) 10 MG tablet Take 10 mg by mouth daily.    [provider]  losartan (COZAAR) 100 MG tablet Take 100 mg by mouth daily.    [provider]  montelukast (SINGULAIR) 10 MG tablet Take 10 mg by mouth at bedtime.    [provider]  pantoprazole  (PROTONIX ) 40 MG tablet Take 40 mg by mouth daily. 01/01/23   [provider]  rosuvastatin  (CRESTOR ) 5 MG tablet Take 1 tablet (5 mg total) by mouth daily. 12/22/22 10/04/23  Swinyer,  Rosaline HERO, NP     Critical care time: 55 mins      Christian Claudene CANNON   [facility] 11/28/2023, 2:33 AM  Please see [email or web address] for pager details.  From 7A-7P if no response, please call [phone]. After hours, please call ELink [phone].

## 2023-11-28 NOTE — Plan of Care (Addendum)
 I have seen and examined patient.  H&P from overnight is reviewed. Patient is alert oriented.  Denies any distress. He is on 2 L oxygen.  No wheeze.  Clear lungs bilaterally. CT head did not show anything acute. Chest x-ray is reviewed showed hyperinflation but no infiltrates. Labs reviewed.  Patient has low serum osmolality low urine sodium and low urine osmolarity.  Imp: Hypovolemic hyponatremia likely due to poor solute intake and increased alcohol consumption.  Plan: -Patient's sodium shot up from 102 to 111 in 12 hours. -Stop hypertonic saline. - 500 cc of dextrose  was given. - Patient is allowed to eat. - Monitor sodiums every 4 hours.  Goal is to keep sodium below 111 for tonight. - If it is keep rising we will do dextrose  boluses and DDAVP. - Patient does not have any signs or symptoms of COPD exacerbation will hold off on further steroids. -He is on Symbicort at home [facility] as ordered inpatient.  His home dose montelukast is continued - His last drink was last night CIWA protocol with phenobarb is ordered. - Monitor in ICU until sodium is above 120 and then transfer out of ICU.  Tamela Stakes, MD  Attending Physician, Critical Care Medicine [place] [facility] See Amion for pager If no response to pager, please call [phone] until 7pm After 7pm, Please call E-link [phone]

## 2023-11-28 NOTE — Plan of Care (Signed)

## 2023-11-28 NOTE — Progress Notes (Signed)
 eLink Physician-Brief Progress Note Patient Name: Allen Wood DOB: [birth year] MRN: [id number]   Date of Service  11/28/2023  HPI/Events of Note  Na 112 at 2200  eICU Interventions  Bolus 500 CC D5W Continue to follow sodium closely     Intervention Category Intermediate Interventions: Electrolyte abnormality - evaluation and management  CLAUDENE AGENT, P 11/28/2023, 10:43 PM

## 2023-11-28 NOTE — Progress Notes (Addendum)
 eLink Physician-Brief Progress Note Patient Name: Allen Wood DOB: July 15, 1970 MRN: [id number]   Date of Service  11/28/2023  HPI/Events of Note  53/M with history of ETOH abuse, COPD, presents to the ED with dizziness. Subseqeunt workup had been significant for severe hyponatremia, with sodium of 102. He was started on 3% saline and admitted to the ICU for serial neurochecks.    eICU Interventions  Severe hyponatremia - Serial neurochecks per protocol - Hyponatremia attributed to beer potomania.  - Pt had been started on 3% normal saline, with last sodium up to 107.  - Will hold 3% saline for now to avoid overcorrection.  - Target Na of around 170mmol/L) in 24 hours.  ETOH abuse - Started on CIWA monitoring. At risk for withdrawal .  - Thiamine , folate, MVI - Correct hypomagnesemia.  - Maintain seizure precautions.          [name] 11/28/2023, 3:25 AM

## 2023-11-28 NOTE — ED Notes (Signed)
Son updated per patient request

## 2023-11-28 NOTE — Progress Notes (Signed)
 Pharmacy Consult for Phenobarbital for Alcohol Withdrawal    Allergies: amlodipine , citalopram, hydrobromide  Recent Labs:     Latest Ref Rng & Units 11/28/2023    2:39 AM 12/08/2015    4:52 PM 11/18/2015    4:53 PM  Hepatic Function  Total Protein 6.5 - 8.1 g/dL 5.7  5.8  6.5   Albumin 3.5 - 5.0 g/dL 2.9  3.6  3.8   AST 15 - 41 U/L 70  63  63   ALT 0 - 44 U/L 26  44  33   Alk Phosphatase 38 - 126 U/L 66  60  61   Total Bilirubin 0.0 - 1.2 mg/dL 0.5  0.6  0.8   Bilirubin, Direct 0.0 - 0.2 mg/dL 0.2      Assessment: 53 yo M who presents for hyponatremia.Last known drink within 12-14 hours. History of alcohol withdrawal seizures Patient meets criteria for high risk dosing of phenobarbital.    Plan:  phenobarbital 97.2mg  oral q 8h x 6 doses followed by   phenobarbital 64.8mg  (two 32.4mg  tablets) oral q 8h x 6 doses followed by phenobarbital 32.4mg  oral q 8h x 6 doses    Thank you for allowing pharmacy to be a part of this patient's care.   Nidia Schaffer, PharmD PGY2 Cardiology Pharmacy Resident  Please check AMION for all [facility] Pharmacy phone numbers After 10:00 PM, call Main Pharmacy [phone]

## 2023-11-29 ENCOUNTER — Other Ambulatory Visit: Payer: Self-pay

## 2023-11-29 ENCOUNTER — Inpatient Hospital Stay (HOSPITAL_COMMUNITY)

## 2023-11-29 DIAGNOSIS — G9341 Metabolic encephalopathy: Secondary | ICD-10-CM | POA: Diagnosis not present

## 2023-11-29 DIAGNOSIS — R9431 Abnormal electrocardiogram [ECG] [EKG]: Secondary | ICD-10-CM

## 2023-11-29 DIAGNOSIS — E871 Hypo-osmolality and hyponatremia: Secondary | ICD-10-CM | POA: Diagnosis not present

## 2023-11-29 DIAGNOSIS — F1010 Alcohol abuse, uncomplicated: Secondary | ICD-10-CM | POA: Diagnosis not present

## 2023-11-29 DIAGNOSIS — W19XXXS Unspecified fall, sequela: Secondary | ICD-10-CM | POA: Diagnosis not present

## 2023-11-29 DIAGNOSIS — I10 Essential (primary) hypertension: Secondary | ICD-10-CM | POA: Diagnosis not present

## 2023-11-29 LAB — BASIC METABOLIC PANEL WITH GFR
Anion gap: 11 (ref 5–15)
Anion gap: 12 (ref 5–15)
Anion gap: 11 (ref 5–15)
Anion gap: 10 (ref 5–15)
BUN: 22 mg/dL — ABNORMAL HIGH (ref 6–20)
BUN: 23 mg/dL — ABNORMAL HIGH (ref 6–20)
BUN: 21 mg/dL — ABNORMAL HIGH (ref 6–20)
BUN: 22 mg/dL — ABNORMAL HIGH (ref 6–20)
CO2: 27 mmol/L (ref 22–32)
CO2: 26 mmol/L (ref 22–32)
CO2: 25 mmol/L (ref 22–32)
CO2: 24 mmol/L (ref 22–32)
Calcium: 8.1 mg/dL — ABNORMAL LOW (ref 8.9–10.3)
Calcium: 8 mg/dL — ABNORMAL LOW (ref 8.9–10.3)
Calcium: 8.2 mg/dL — ABNORMAL LOW (ref 8.9–10.3)
Calcium: 7.9 mg/dL — ABNORMAL LOW (ref 8.9–10.3)
Chloride: 77 mmol/L — ABNORMAL LOW (ref 98–111)
Chloride: 77 mmol/L — ABNORMAL LOW (ref 98–111)
Chloride: 81 mmol/L — ABNORMAL LOW (ref 98–111)
Chloride: 83 mmol/L — ABNORMAL LOW (ref 98–111)
Creatinine, Ser: 1.42 mg/dL — ABNORMAL HIGH (ref 0.61–1.24)
Creatinine, Ser: 1.33 mg/dL — ABNORMAL HIGH (ref 0.61–1.24)
Creatinine, Ser: 1.25 mg/dL — ABNORMAL HIGH (ref 0.61–1.24)
Creatinine, Ser: 1.29 mg/dL — ABNORMAL HIGH (ref 0.61–1.24)
GFR, Estimated: 58 mL/min — ABNORMAL LOW (ref >60)
GFR, Estimated: >60 mL/min (ref >60)
GFR, Estimated: >60 mL/min (ref >60)
GFR, Estimated: >60 mL/min (ref >60)
Glucose, Bld: 125 mg/dL — ABNORMAL HIGH (ref 70–99)
Glucose, Bld: 156 mg/dL — ABNORMAL HIGH (ref 70–99)
Glucose, Bld: 159 mg/dL — ABNORMAL HIGH (ref 70–99)
Glucose, Bld: 133 mg/dL — ABNORMAL HIGH (ref 70–99)
Potassium: 3.4 mmol/L — ABNORMAL LOW (ref 3.5–5.1)
Potassium: 3.4 mmol/L — ABNORMAL LOW (ref 3.5–5.1)
Potassium: 4.2 mmol/L (ref 3.5–5.1)
Potassium: 4.1 mmol/L (ref 3.5–5.1)
Sodium: 115 mmol/L — CL (ref 135–145)
Sodium: 115 mmol/L — CL (ref 135–145)
Sodium: 117 mmol/L — CL (ref 135–145)
Sodium: 117 mmol/L — CL (ref 135–145)

## 2023-11-29 LAB — ECHOCARDIOGRAM COMPLETE
Area-P 1/2: 2.8 cm2
Calc EF: 59.1 %
Height: 71 in
S' Lateral: 3.4 cm
Single Plane A2C EF: 61.5 %
Single Plane A4C EF: 56.2 %
Weight: 2723.12 [oz_av]

## 2023-11-29 LAB — GLUCOSE, CAPILLARY
Glucose-Capillary: 110 mg/dL — ABNORMAL HIGH (ref 70–99)
Glucose-Capillary: 112 mg/dL — ABNORMAL HIGH (ref 70–99)
Glucose-Capillary: 118 mg/dL — ABNORMAL HIGH (ref 70–99)
Glucose-Capillary: 133 mg/dL — ABNORMAL HIGH (ref 70–99)
Glucose-Capillary: 139 mg/dL — ABNORMAL HIGH (ref 70–99)
Glucose-Capillary: 176 mg/dL — ABNORMAL HIGH (ref 70–99)

## 2023-11-29 LAB — MAGNESIUM: Magnesium: 1.7 mg/dL (ref 1.7–2.4)

## 2023-11-29 LAB — SODIUM: Sodium: 112 mmol/L — CL (ref 135–145)

## 2023-11-29 LAB — PHOSPHORUS: Phosphorus: 2.5 mg/dL (ref 2.5–4.6)

## 2023-11-29 MED ORDER — SODIUM CHLORIDE 3 % IV BOLUS
50.0000 mL | Freq: Once | INTRAVENOUS | Status: AC
  Administered 2023-11-29: 50 mL via INTRAVENOUS
  Filled 2023-11-29: qty 500

## 2023-11-29 MED ORDER — LOSARTAN POTASSIUM 25 MG PO TABS
25.0000 mg | ORAL_TABLET | Freq: Every day | ORAL | Status: DC
  Administered 2023-11-30 – 2023-12-05 (×6): 25 mg via ORAL
  Filled 2023-11-29 (×7): qty 1

## 2023-11-29 MED ORDER — UMECLIDINIUM-VILANTEROL 62.5-25 MCG/ACT IN AEPB
1.0000 | INHALATION_SPRAY | Freq: Every day | RESPIRATORY_TRACT | Status: DC
  Administered 2023-12-01 – 2023-12-06 (×6): 1 via RESPIRATORY_TRACT
  Filled 2023-11-29 (×2): qty 14

## 2023-11-29 MED ORDER — MAGNESIUM SULFATE 2 GM/50ML IV SOLN
2.0000 g | Freq: Once | INTRAVENOUS | Status: AC
  Administered 2023-11-29: 2 g via INTRAVENOUS
  Filled 2023-11-29: qty 50

## 2023-11-29 MED ORDER — SODIUM CHLORIDE 0.9% FLUSH: 10.0000 mL | INTRAVENOUS | Status: DC | PRN

## 2023-11-29 MED ORDER — DEXTROSE 5 % IV BOLUS
500.0000 mL | Freq: Once | INTRAVENOUS | Status: AC
  Administered 2023-11-29: 500 mL via INTRAVENOUS

## 2023-11-29 MED ORDER — SODIUM CHLORIDE 3 % IV BOLUS
50.0000 mL | Freq: Once | INTRAVENOUS | Status: AC
Start: 2023-11-29 — End: 2023-11-29
  Administered 2023-11-29: 50 mL via INTRAVENOUS
  Filled 2023-11-29: qty 500

## 2023-11-29 MED ORDER — SODIUM CHLORIDE 0.9% FLUSH
10.0000 mL | Freq: Two times a day (BID) | INTRAVENOUS | Status: DC
  Administered 2023-11-29: 10 mL
  Administered 2023-11-29: 40 mL
  Administered 2023-11-30 – 2023-12-02 (×5): 10 mL
  Administered 2023-12-02: 20 mL
  Administered 2023-12-03 – 2023-12-06 (×7): 10 mL

## 2023-11-29 MED ORDER — POTASSIUM CHLORIDE CRYS ER 20 MEQ PO TBCR
40.0000 meq | EXTENDED_RELEASE_TABLET | Freq: Once | ORAL | Status: AC
  Administered 2023-11-29: 40 meq via ORAL
  Filled 2023-11-29: qty 2

## 2023-11-29 NOTE — Plan of Care (Signed)

## 2023-11-29 NOTE — Plan of Care (Signed)
 Progressing with the care plan well. CIWA and Neuro checks have been within normal limits. No agitation issues. Will work with PT tomorrow 11/04.

## 2023-11-29 NOTE — Progress Notes (Signed)
 Peripherally Inserted Central Catheter Placement  The IV Nurse has discussed with the patient and/or persons authorized to consent for the patient, the purpose of this procedure and the potential benefits and risks involved with this procedure.  The benefits include less needle sticks, lab draws from the catheter, and the patient may be discharged home with the catheter. Risks include, but not limited to, infection, bleeding, blood clot (thrombus formation), and puncture of an artery; nerve damage and irregular heartbeat and possibility to perform a PICC exchange if needed/ordered by physician.  Alternatives to this procedure were also discussed.  Bard Power PICC patient education guide, fact sheet on infection prevention and patient information card has been provided to patient /or left at bedside.    PICC Placement Documentation  PICC Double Lumen 11/29/23 Right Basilic 38 cm 0 cm (Active)  Indication for Insertion or Continuance of Line Prolonged intravenous therapies 11/29/23 1225  Exposed Catheter (cm) 0 cm 11/29/23 1225  Site Assessment Clean, Dry, Intact 11/29/23 1225  Lumen #1 Status Flushed;Blood return noted;Saline locked 11/29/23 1225  Lumen #2 Status Flushed;Blood return noted;Saline locked 11/29/23 1225  Dressing Type Transparent 11/29/23 1225  Dressing Status Antimicrobial disc/dressing in place 11/29/23 1225  Line Care Connections checked and tightened 11/29/23 1225  Line Adjustment (NICU/IV Team Only) No 11/29/23 1225  Dressing Intervention New dressing 11/29/23 1225  Dressing Change Due 12/06/23 11/29/23 1225       Allen Wood 11/29/2023, 12:27 PM

## 2023-11-29 NOTE — Discharge Instructions (Signed)

## 2023-11-29 NOTE — Progress Notes (Signed)
 NAME:  Allen Wood, MRN:  [id number], DOB:  Sep 04, 1970, LOS: 1 ADMISSION DATE:  11/27/2023, CONSULTATION DATE:  11/2  REFERRING: Leita Chancy PA   CHIEF COMPLAINT: Dizziness   History of Present Illness:  Patient is a 53 yr old male with past medical hx significant of etoh abuse, seizures due to ETOH withdrawal in 2016-2017, HTN, HLD, anemia-IDA, COPD, tobacco abuse, anxiety, and depression who presents to [facility] ED due to by EMS due to dizziness that has been ongoing for a week. Patient also endorsed that he had seen PCP earlier Upon initial lab work up, CT of head negative but patient noted to have severe hyponatremia. PCCM consulted for ICU admission in setting of severe hyponatremia- Na 102, requiring 3% saline and frequent neurochecks.   Upon, initial assessment. Patient drowsy but able to answer questions. Patient states that his last drink of alcohol was early this afternoon. Patient had consumed  pint of bourbon. Patient states usually drinks bourbon on the weekends, at least  pint of bourbon daily on weekend days. During the week, he states he drinks mainly beer but unable to provide quantity. Patient mentions that he did try to quit once and was admitted for withdrawals and having seizure in relation to. He believes this was anywhere from 5-10 yrs ago. Patient not taking anything for seizures. Patient endorses that he smokes almost 1pack of cigarettes a day but could not tell over how many years ('per patient since he was young'). Patient endorses an episode of vomiting 3 days ago but has not happened since. Patient denies any bleeding (hemoptysis, hematemesis, hematochezia), sick contacts, or recent falls. Patient admits that he lives with his wife and son.   Pertinent  Medical History   Past Medical History:  Diagnosis Date   Alcohol abuse    Anemia    Anxiety    Aortic atherosclerosis    Chronic bronchitis (HCC)    COPD with emphysema (HCC)    Delirium tremens (HCC)    Depression     Elevated blood pressure reading without diagnosis of hypertension    Elevated LFTs    Emphysema, unspecified (HCC)    History of COVID-19    Hyponatremia    Insomnia    Pneumonia 2016   septic shock, trach/PEG/chest tube, both resolved   Seasonal allergic rhinitis    Seizures (HCC)    while intubated with PNA in 2016; post-ETOH in 2017   Tobacco dependence      Significant Hospital Events: Including procedures, antibiotic start and stop dates in addition to other pertinent events   11/2 PCCM admit for severe hyponatremia   Interim History / Subjective:  This AM sodium 112, given dextrose  overnight to prevent over correction. Repeat labs pending.   Objective    Blood pressure 95/85, pulse 80, temperature 97.9 F (36.6 C), temperature source Oral, resp. rate 18, height 5' 11 (1.803 m), weight 77.2 kg, SpO2 93%.        Intake/Output Summary (Last 24 hours) at 11/29/2023 0955 Last data filed at 11/29/2023 0801 Gross per 24 hour  Intake 2430.48 ml  Output 2155 ml  Net 275.48 ml   Filed Weights   11/27/23 2238 11/28/23 0400  Weight: 77.1 kg 77.2 kg    Examination: General: chronically ill appearing adult male sitting in bed  HEENT: poor dentition, MMM  Pulm: clear breath sounds, no use of accessory muscles  CV: s1, s2, RRR, no JVD, No MRG  Abs: soft, active bowel sounds  Skin: intact  Extremities: -edema  Neuro: alert, follows commands throughout, oriented   GU: deferred  Resolved problem list   Assessment and Plan   Severe Hyponatremia suspect in setting of ETOH abuse with poor nutrition  Beer Potomania  -Na 102 (original per istat) >105>106  -Urine Osm 190, serum osmo 233, Sodium < 30  P:  Trend sodium q4h > repeat pending now, giving 50 ml 3% and will evaluate next level  Seizure precautions Aspiration precautions  Avoid over correction, goal to not exceed 122 over next 24 hours   Acute Metabolic Enceph secondary to Hyponatremia and ETOH abuse >  improving  ETOH abuse Hx of Seizures in setting of withdrawals  Per patient hx of seizures 5-10 yrs ago in setting of withdrawal  -CT head negative  P: Continue trx for hyponatremia above  Continue CIWA protocol  Delirium precautions  Folic Acid /Thiamine /MV Continue phenobarbital taper and PRN ativan    COPD  Tobacco Abuse: On Symbicort, singular  Albuterol  PRN P: Recommend smoking Cessation  Nicotine  patch  Albuterol  PRN Breo elipta inhaler once daily >changed to Anoro  Continue singular   GERD P: Continue home PPI  Elevated BNP  Biatrial enlargement noted on EKG  Probable element of CHF secondary to ETOH use/HTN P: Obtain ECHO (pending)   HTN: P: Restart home losartan >decrease dose to 25mg  daily   HLD P: Continue statin   IDA P: Continue Iron   Anxiety Depression P:  On no home medications Continue supportive care   Labs   CBC: Recent Labs  Lab 11/27/23 2247 11/27/23 2255 11/28/23 0858  WBC 5.7  --  2.9*  HGB 11.7* 12.2* 11.9*  HCT 31.3* 36.0* 31.0*  MCV 86.9  --  85.2  PLT 277  --  269    Basic Metabolic Panel: Recent Labs  Lab 11/27/23 2255 11/27/23 2318 11/28/23 0028 11/28/23 0239 11/28/23 0858 11/28/23 1420 11/28/23 1901 11/28/23 2149 11/29/23 0325  NA 102* 105*   < >  --  111* 109* 109* 112* 112*  K 4.0 3.9  --   --  4.7  --   --   --   --   CL 68* 67*  --   --  73*  --   --   --   --   CO2  --  23  --   --  25  --   --   --   --   GLUCOSE 73 73  --   --  117*  --   --   --   --   BUN 13 12  --   --  14  --   --   --   --   CREATININE 1.30* 1.07  --   --  1.08  --   --   --   --   CALCIUM   --  8.1*  --   --  8.2*  --   --   --   --   MG  --   --   --  1.3* 2.0  --   --   --   --   PHOS  --   --   --  3.8 5.3*  --   --   --   --    < > = values in this interval not displayed.   GFR: Estimated Creatinine Clearance: 82.3 mL/min (by C-G formula based on SCr of 1.08 mg/dL). Recent Labs  Lab 11/27/23 2247 11/28/23 9141  WBC 5.7 2.9*    Liver Function Tests: Recent Labs  Lab 11/28/23 0239  AST 70*  ALT 26  ALKPHOS 66  BILITOT 0.5  PROT 5.7*  ALBUMIN 2.9*   No results for input(s): LIPASE, AMYLASE in the last 168 hours. Recent Labs  Lab 11/28/23 0227  AMMONIA 23    ABG    Component Value Date/Time   PHART 7.417 01/31/2014 1041   PCO2ART 34.1 (L) 01/31/2014 1041   PO2ART 40.0 (L) 01/31/2014 1041   HCO3 23.0 01/31/2014 1041   TCO2 25 11/27/2023 2255   ACIDBASEDEF 2.0 01/31/2014 1041   O2SAT 61.8 01/31/2014 1112     Coagulation Profile: No results for input(s): INR, PROTIME in the last 168 hours.  Cardiac Enzymes: No results for input(s): CKTOTAL, CKMB, CKMBINDEX, TROPONINI in the last 168 hours.  HbA1C: Hgb A1c MFr Bld  Date/Time Value Ref Range Status  11/28/2023 07:01 PM 4.6 (L) 4.8 - 5.6 % Final    Comment:    (NOTE) Diagnosis of Diabetes The following HbA1c ranges recommended by the American Diabetes Association (ADA) may be used as an aid in the diagnosis of diabetes mellitus.  Hemoglobin             Suggested A1C NGSP%              Diagnosis  <5.7                   Non Diabetic  5.7-6.4                Pre-Diabetic  >6.4                   Diabetic  <7.0                   Glycemic control for                       adults with diabetes.      CBG: Recent Labs  Lab 11/28/23 2312 11/28/23 2316 11/29/23 0030 11/29/23 0321 11/29/23 0802  GLUCAP 59* 64* 176* 112* 139*    Review of Systems:   Review of Systems  Constitutional: Negative.   HENT: Negative.    Eyes: Negative.   Respiratory: Negative.    Cardiovascular: Negative.   Gastrointestinal: Negative.   Genitourinary: Negative.   Musculoskeletal: Negative.   Skin: Negative.   Neurological:  Positive for dizziness.  Endo/Heme/Allergies: Negative.   Psychiatric/Behavioral: Negative.       Past Medical History:  He,  has a past medical history of Alcohol abuse, Anemia, Anxiety,  Aortic atherosclerosis, Chronic bronchitis (HCC), COPD with emphysema (HCC), Delirium tremens (HCC), Depression, Elevated blood pressure reading without diagnosis of hypertension, Elevated LFTs, Emphysema, unspecified (HCC), History of COVID-19, Hyponatremia, Insomnia, Pneumonia (2016), Seasonal allergic rhinitis, Seizures (HCC), and Tobacco dependence.   Surgical History:   Past Surgical History:  Procedure Laterality Date   CYSTECTOMY Right 2024   RIGHT SHOULDER   ESOPHAGOGASTRODUODENOSCOPY N/A 02/03/2014   Procedure: ESOPHAGOGASTRODUODENOSCOPY (EGD);  Surgeon: Gwendlyn ONEIDA Buddy, MD;  Location: [facility] ENDOSCOPY;  Service: Endoscopy;  Laterality: N/A;  bedside/trach/vent   MOUTH SURGERY     teeth removed.   PEG PLACEMENT N/A 02/03/2014   Procedure: PERCUTANEOUS ENDOSCOPIC GASTROSTOMY (PEG) PLACEMENT;  Surgeon: Gwendlyn ONEIDA Buddy, MD;  Location: [facility] ENDOSCOPY;  Service: Endoscopy;  Laterality: N/A;   SEPARATED LEFT SHOULDER     TRACHEOSTOMY  01/30/2014   [facility]  Social History:   reports that he has been smoking cigarettes. He started smoking about 29 years ago. He has a 20 pack-year smoking history. He has never used smokeless tobacco. He reports current alcohol use. He reports that he does not use drugs.   Family History:  His family history includes Cancer in his father and mother.   Allergies Allergies  Allergen Reactions   Amlodipine  Swelling    Significant leg swelling   Citalopram Hydrobromide     MADE ME FEEL HIGH     Home Medications  Prior to Admission medications   Medication Sig Start Date End Date Taking? Authorizing Provider  albuterol  (PROVENTIL  HFA;VENTOLIN  HFA) 108 (90 Base) MCG/ACT inhaler Inhale 2 puffs into the lungs every 6 (six) hours as needed for wheezing or shortness of breath.    [provider]  Bisoprolol  Fumarate 2.5 MG TABS Take 2.5 mg by mouth daily at 8 pm. 10/22/23   Swinyer, Rosaline HERO, NP  budesonide-formoterol (SYMBICORT) 160-4.5 MCG/ACT  inhaler Inhale 2 puffs into the lungs 2 (two) times daily.    [provider]  Ensure (ENSURE) Take 1 Can by mouth.    [provider]  ferrous sulfate  324 MG TBEC Take 324 mg by mouth. Tries taking 1 tab daily. Patient taking differently: Take 324 mg by mouth every other day. Tries taking 1 tab daily.    [provider]  ibuprofen (ADVIL) 400 MG tablet Take 400 mg by mouth 3 (three) times daily.    [provider]  loratadine (CLARITIN) 10 MG tablet Take 10 mg by mouth daily.    [provider]  losartan (COZAAR) 100 MG tablet Take 100 mg by mouth daily.    [provider]  montelukast (SINGULAIR) 10 MG tablet Take 10 mg by mouth at bedtime.    [provider]  pantoprazole  (PROTONIX ) 40 MG tablet Take 40 mg by mouth daily. 01/01/23   [provider]  rosuvastatin  (CRESTOR ) 5 MG tablet Take 1 tablet (5 mg total) by mouth daily. 12/22/22 10/04/23  Swinyer, Rosaline HERO, NP     Critical care time: 35 mins      CRITICAL CARE Performed by: Dennis HERO An   Total critical care time: 35 minutes  Critical care time was exclusive of separately billable procedures and treating other patients.  Critical care was necessary to treat or prevent imminent or life-threatening deterioration.  Critical care was time spent personally by me on the following activities: development of treatment plan with patient and/or surrogate as well as nursing, discussions with consultants, evaluation of patient's response to treatment, examination of patient, obtaining history from patient or surrogate, ordering and performing treatments and interventions, ordering and review of laboratory studies, ordering and review of radiographic studies, pulse oximetry and re-evaluation of patient's condition.

## 2023-11-29 NOTE — Progress Notes (Signed)
  Echocardiogram 2D Echocardiogram has been performed.  Allen Wood 11/29/2023, 11:32 AM

## 2023-11-29 NOTE — Progress Notes (Addendum)
 eLink Physician-Brief Progress Note Patient Name: Vane Yapp DOB: 04/26/1970 MRN: [id number]   Date of Service  11/29/2023  HPI/Events of Note  Notified of critical sodium 117  Rise of 5 mEq over 24 hours  eICU Interventions  Repeat 3% saline bolus     Intervention Category Intermediate Interventions: Electrolyte abnormality - evaluation and management  Kerron Sedano 11/29/2023, 10:01 PM

## 2023-11-29 NOTE — TOC CM/SW Note (Signed)
 Transition of Care (TOC) CM/SW Note    CSW attached outpatient substance use treatment services resources to patients AVS.

## 2023-11-30 DIAGNOSIS — G9341 Metabolic encephalopathy: Secondary | ICD-10-CM | POA: Diagnosis not present

## 2023-11-30 DIAGNOSIS — E871 Hypo-osmolality and hyponatremia: Secondary | ICD-10-CM | POA: Diagnosis not present

## 2023-11-30 DIAGNOSIS — D509 Iron deficiency anemia, unspecified: Secondary | ICD-10-CM | POA: Diagnosis not present

## 2023-11-30 DIAGNOSIS — W19XXXS Unspecified fall, sequela: Secondary | ICD-10-CM | POA: Diagnosis not present

## 2023-11-30 DIAGNOSIS — I10 Essential (primary) hypertension: Secondary | ICD-10-CM | POA: Diagnosis not present

## 2023-11-30 DIAGNOSIS — R739 Hyperglycemia, unspecified: Secondary | ICD-10-CM | POA: Diagnosis not present

## 2023-11-30 DIAGNOSIS — E43 Unspecified severe protein-calorie malnutrition: Secondary | ICD-10-CM | POA: Diagnosis not present

## 2023-11-30 DIAGNOSIS — F1010 Alcohol abuse, uncomplicated: Secondary | ICD-10-CM | POA: Diagnosis not present

## 2023-11-30 LAB — PHOSPHORUS: Phosphorus: 3.4 mg/dL (ref 2.5–4.6)

## 2023-11-30 LAB — BASIC METABOLIC PANEL WITH GFR
Anion gap: 9 (ref 5–15)
Anion gap: 8 (ref 5–15)
Anion gap: 9 (ref 5–15)
Anion gap: 8 (ref 5–15)
Anion gap: 8 (ref 5–15)
BUN: 21 mg/dL — ABNORMAL HIGH (ref 6–20)
BUN: 18 mg/dL (ref 6–20)
BUN: 17 mg/dL (ref 6–20)
BUN: 18 mg/dL (ref 6–20)
BUN: 17 mg/dL (ref 6–20)
CO2: 26 mmol/L (ref 22–32)
CO2: 28 mmol/L (ref 22–32)
CO2: 27 mmol/L (ref 22–32)
CO2: 26 mmol/L (ref 22–32)
CO2: 24 mmol/L (ref 22–32)
Calcium: 7.7 mg/dL — ABNORMAL LOW (ref 8.9–10.3)
Calcium: 8 mg/dL — ABNORMAL LOW (ref 8.9–10.3)
Calcium: 8 mg/dL — ABNORMAL LOW (ref 8.9–10.3)
Calcium: 7.8 mg/dL — ABNORMAL LOW (ref 8.9–10.3)
Calcium: 7.3 mg/dL — ABNORMAL LOW (ref 8.9–10.3)
Chloride: 84 mmol/L — ABNORMAL LOW (ref 98–111)
Chloride: 85 mmol/L — ABNORMAL LOW (ref 98–111)
Chloride: 84 mmol/L — ABNORMAL LOW (ref 98–111)
Chloride: 86 mmol/L — ABNORMAL LOW (ref 98–111)
Chloride: 87 mmol/L — ABNORMAL LOW (ref 98–111)
Creatinine, Ser: 1.25 mg/dL — ABNORMAL HIGH (ref 0.61–1.24)
Creatinine, Ser: 1.02 mg/dL (ref 0.61–1.24)
Creatinine, Ser: 1.11 mg/dL (ref 0.61–1.24)
Creatinine, Ser: 1.17 mg/dL (ref 0.61–1.24)
Creatinine, Ser: 1.04 mg/dL (ref 0.61–1.24)
GFR, Estimated: >60 mL/min (ref >60)
GFR, Estimated: >60 mL/min (ref >60)
GFR, Estimated: >60 mL/min (ref >60)
GFR, Estimated: >60 mL/min (ref >60)
GFR, Estimated: >60 mL/min (ref >60)
Glucose, Bld: 91 mg/dL (ref 70–99)
Glucose, Bld: 89 mg/dL (ref 70–99)
Glucose, Bld: 92 mg/dL (ref 70–99)
Glucose, Bld: 171 mg/dL — ABNORMAL HIGH (ref 70–99)
Glucose, Bld: 162 mg/dL — ABNORMAL HIGH (ref 70–99)
Potassium: 4.1 mmol/L (ref 3.5–5.1)
Potassium: 4.2 mmol/L (ref 3.5–5.1)
Potassium: 4.3 mmol/L (ref 3.5–5.1)
Potassium: 4 mmol/L (ref 3.5–5.1)
Potassium: 3.8 mmol/L (ref 3.5–5.1)
Sodium: 119 mmol/L — CL (ref 135–145)
Sodium: 121 mmol/L — ABNORMAL LOW (ref 135–145)
Sodium: 120 mmol/L — ABNORMAL LOW (ref 135–145)
Sodium: 120 mmol/L — ABNORMAL LOW (ref 135–145)
Sodium: 119 mmol/L — CL (ref 135–145)

## 2023-11-30 LAB — CBC
HCT: 29.1 % — ABNORMAL LOW (ref 39.0–52.0)
Hemoglobin: 10.4 g/dL — ABNORMAL LOW (ref 13.0–17.0)
MCH: 32.5 pg (ref 26.0–34.0)
MCHC: 35.7 g/dL (ref 30.0–36.0)
MCV: 90.9 fL (ref 80.0–100.0)
Platelets: 288 K/uL (ref 150–400)
RBC: 3.2 MIL/uL — ABNORMAL LOW (ref 4.22–5.81)
RDW: 12.2 % (ref 11.5–15.5)
WBC: 7.1 K/uL (ref 4.0–10.5)
nRBC: 0 % (ref 0.0–0.2)

## 2023-11-30 LAB — GLUCOSE, CAPILLARY
Glucose-Capillary: 102 mg/dL — ABNORMAL HIGH (ref 70–99)
Glucose-Capillary: 106 mg/dL — ABNORMAL HIGH (ref 70–99)
Glucose-Capillary: 134 mg/dL — ABNORMAL HIGH (ref 70–99)
Glucose-Capillary: 142 mg/dL — ABNORMAL HIGH (ref 70–99)
Glucose-Capillary: 152 mg/dL — ABNORMAL HIGH (ref 70–99)
Glucose-Capillary: 179 mg/dL — ABNORMAL HIGH (ref 70–99)
Glucose-Capillary: 99 mg/dL (ref 70–99)

## 2023-11-30 LAB — MAGNESIUM: Magnesium: 1.8 mg/dL (ref 1.7–2.4)

## 2023-11-30 LAB — SODIUM: Sodium: 123 mmol/L — ABNORMAL LOW (ref 135–145)

## 2023-11-30 LAB — AMMONIA: Ammonia: 24 umol/L (ref 9–35)

## 2023-11-30 MED ORDER — IPRATROPIUM-ALBUTEROL 0.5-2.5 (3) MG/3ML IN SOLN
3.0000 mL | RESPIRATORY_TRACT | Status: DC | PRN
  Administered 2023-12-05: 3 mL via RESPIRATORY_TRACT
  Filled 2023-11-30: qty 3

## 2023-11-30 MED ORDER — MAGNESIUM SULFATE 2 GM/50ML IV SOLN
2.0000 g | Freq: Once | INTRAVENOUS | Status: AC
  Administered 2023-11-30: 2 g via INTRAVENOUS
  Filled 2023-11-30: qty 50

## 2023-11-30 MED ORDER — SODIUM CHLORIDE 0.9 % IV SOLN: INTRAVENOUS | Status: AC

## 2023-11-30 MED ORDER — NICOTINE 21 MG/24HR TD PT24
21.0000 mg | MEDICATED_PATCH | Freq: Every day | TRANSDERMAL | Status: DC
  Administered 2023-11-30 – 2023-12-06 (×7): 21 mg via TRANSDERMAL
  Filled 2023-11-30 (×7): qty 1

## 2023-11-30 NOTE — Plan of Care (Signed)

## 2023-11-30 NOTE — Progress Notes (Signed)
 NAME:  Allen Wood, MRN:  [id number], DOB:  11/08/1970, LOS: 2 ADMISSION DATE:  11/27/2023, CONSULTATION DATE:  11/2  REFERRING: Leita Chancy PA   CHIEF COMPLAINT: Dizziness   History of Present Illness:  Patient is a 53 yr old male with past medical hx significant of etoh abuse, seizures due to ETOH withdrawal in 2016-2017, HTN, HLD, anemia-IDA, COPD, tobacco abuse, anxiety, and depression who presents to [facility] ED due to by EMS due to dizziness that has been ongoing for a week. Patient also endorsed that he had seen PCP earlier. Upon initial lab work up, CT of head negative but patient noted to have severe hyponatremia. PCCM consulted for ICU admission in setting of severe hyponatremia- Na 102, requiring 3% saline and frequent neurochecks.   Upon, initial assessment. Patient drowsy but able to answer questions. Patient states that his last drink of alcohol was early this afternoon. Patient had consumed  pint of bourbon. Patient states usually drinks bourbon on the weekends, at least  pint of bourbon daily on weekend days. During the week, he states he drinks mainly beer but unable to provide quantity. Patient mentions that he did try to quit once and was admitted for withdrawals and having seizure in relation to. He believes this was anywhere from 5-10 yrs ago. Patient not taking anything for seizures. Patient endorses that he smokes almost 1pack of cigarettes a day but could not tell over how many years ('per patient since he was young'). Patient endorses an episode of vomiting 3 days ago but has not happened since. Patient denies any bleeding (hemoptysis, hematemesis, hematochezia), sick contacts, or recent falls. Lives with his wife and son.   Pertinent  Medical History   Past Medical History:  Diagnosis Date   Alcohol abuse    Anemia    Anxiety    Aortic atherosclerosis    Chronic bronchitis (HCC)    COPD with emphysema (HCC)    Delirium tremens (HCC)    Depression    Elevated blood  pressure reading without diagnosis of hypertension    Elevated LFTs    Emphysema, unspecified (HCC)    History of COVID-19    Hyponatremia    Insomnia    Pneumonia 2016   septic shock, trach/PEG/chest tube, both resolved   Seasonal allergic rhinitis    Seizures (HCC)    while intubated with PNA in 2016; post-ETOH in 2017   Tobacco dependence      Significant Hospital Events: Including procedures, antibiotic start and stop dates in addition to other pertinent events   11/2 PCCM admit for severe hyponatremia  10/4: overnight x1 3% saline bolus   Interim History / Subjective:  Overnight received x1 3% saline bolus. Na on 0600 labs 121. No complaints this morning. Somewhat drowsy on exam but oriented. Wife is at bedside   Objective    Blood pressure 116/67, pulse 85, temperature 98.9 F (37.2 C), temperature source Axillary, resp. rate (!) 21, height 5' 11 (1.803 m), weight 78.6 kg, SpO2 97%.        Intake/Output Summary (Last 24 hours) at 11/30/2023 9287 Last data filed at 11/30/2023 0000 Gross per 24 hour  Intake 732.7 ml  Output 2025 ml  Net -1292.3 ml   Filed Weights   11/27/23 2238 11/28/23 0400 11/30/23 0500  Weight: 77.1 kg 77.2 kg 78.6 kg    Examination: General: chronically ill appearing, middle aged male, laying in bed, no acute distress  HEENT: ncat, perrla, anicteric sclera  Pulm: expiratory  rhonchi no wheezing, on 2LNC, no resp distress  CV: s1s2, no murmur, rub, gallop Abd: rounded, soft, +BS, non-teder Extremities: no edema, warm  Neuro: drowsy, arouses to voice, oriented x3. Non focal exam.  GU: external urinary cath   Resolved problem list   Assessment and Plan   Severe Hyponatremia suspect in setting of ETOH abuse with poor nutrition  Beer Potomania  -Na 102 (original per istat),Urine Osm 190, serum osmo 233, Sodium < 30. 11/4 corrected to 121  P:  - start 0.9% NS @ 50cc/hr  - recheck Na at 12, then q6, want to keep above 120  - seizure  precautions   Acute Metabolic Enceph secondary to Hyponatremia and ETOH abuse > improving  ETOH abuse Hx of Seizures in setting of withdrawals  Per patient hx of seizures 5-10 yrs ago in setting of withdrawal  -CT head negative  P: - check ammonia  - hyponatremia treatment as above  - con't phenobarbital taper  - prn ativan   - wernicke thiamine  dosing, folic acid , mtvn  - seizure and delirium precautions   COPD  Tobacco Abuse: Home meds Symbicort, singular and prn albuterol   P: - nicotine  patch ordered  - switched breo ellipta to Anoro inhaler  - con't singulair 10mg  daily  - avoid ICS with COPD  - add prn duoneb  - recommend smoking Cessation   GERD P: - not on home ppi  - protonix  40mg  daily   Elevated BNP  Biatrial enlargement noted on EKG  Probable element of CHF secondary to ETOH use/HTN P: - echo yesterday with EF 55-60%, G1DD  HTN: P: - restarted his home Losartan at 25mg  daily, may be able to discontinue today and just observe him   HLD P: - crestor  5mg  daily   IDA P: - con't home ferrous sulfate  325mg  daily   Anxiety Depression P:  - no home meds   Labs   CBC: Recent Labs  Lab 11/27/23 2247 11/27/23 2255 11/28/23 0858 11/30/23 0611  WBC 5.7  --  2.9* 7.1  HGB 11.7* 12.2* 11.9* 10.4*  HCT 31.3* 36.0* 31.0* 29.1*  MCV 86.9  --  85.2 90.9  PLT 277  --  269 288    Basic Metabolic Panel: Recent Labs  Lab 11/28/23 0239 11/28/23 0858 11/28/23 1420 11/29/23 0944 11/29/23 1318 11/29/23 1651 11/29/23 2039 11/30/23 0057 11/30/23 0611  NA  --  111*   < > 115* 115* 117* 117* 119* 121*  K  --  4.7  --  3.4* 3.4* 4.2 4.1 4.1 4.2  CL  --  73*  --  77* 77* 81* 83* 84* 85*  CO2  --  25  --  27 26 25 24 26 28   GLUCOSE  --  117*  --  125* 156* 159* 133* 91 89  BUN  --  14  --  22* 23* 21* 22* 21* 18  CREATININE  --  1.08  --  1.42* 1.33* 1.25* 1.29* 1.25* 1.02  CALCIUM   --  8.2*  --  8.1* 8.0* 8.2* 7.9* 7.7* 8.0*  MG 1.3* 2.0  --  1.7  --    --   --   --  1.8  PHOS 3.8 5.3*  --  2.5  --   --   --   --  3.4   < > = values in this interval not displayed.   GFR: Estimated Creatinine Clearance: 87.2 mL/min (by C-G formula based on SCr of 1.02 mg/dL). Recent  Labs  Lab 11/27/23 2247 11/28/23 0858 11/30/23 0611  WBC 5.7 2.9* 7.1    Liver Function Tests: Recent Labs  Lab 11/28/23 0239  AST 70*  ALT 26  ALKPHOS 66  BILITOT 0.5  PROT 5.7*  ALBUMIN 2.9*   No results for input(s): LIPASE, AMYLASE in the last 168 hours. Recent Labs  Lab 11/28/23 0227  AMMONIA 23    ABG    Component Value Date/Time   PHART 7.417 01/31/2014 1041   PCO2ART 34.1 (L) 01/31/2014 1041   PO2ART 40.0 (L) 01/31/2014 1041   HCO3 23.0 01/31/2014 1041   TCO2 25 11/27/2023 2255   ACIDBASEDEF 2.0 01/31/2014 1041   O2SAT 61.8 01/31/2014 1112     Coagulation Profile: No results for input(s): INR, PROTIME in the last 168 hours.  Cardiac Enzymes: No results for input(s): CKTOTAL, CKMB, CKMBINDEX, TROPONINI in the last 168 hours.  HbA1C: Hgb A1c MFr Bld  Date/Time Value Ref Range Status  11/28/2023 07:01 PM 4.6 (L) 4.8 - 5.6 % Final    Comment:    (NOTE) Diagnosis of Diabetes The following HbA1c ranges recommended by the American Diabetes Association (ADA) may be used as an aid in the diagnosis of diabetes mellitus.  Hemoglobin             Suggested A1C NGSP%              Diagnosis  <5.7                   Non Diabetic  5.7-6.4                Pre-Diabetic  >6.4                   Diabetic  <7.0                   Glycemic control for                       adults with diabetes.      CBG: Recent Labs  Lab 11/29/23 1123 11/29/23 1626 11/29/23 2020 11/30/23 0000 11/30/23 0400  GLUCAP 118* 110* 133* 99 102*    Review of Systems:   above  Past Medical History:  He,  has a past medical history of Alcohol abuse, Anemia, Anxiety, Aortic atherosclerosis, Chronic bronchitis (HCC), COPD with emphysema (HCC),  Delirium tremens (HCC), Depression, Elevated blood pressure reading without diagnosis of hypertension, Elevated LFTs, Emphysema, unspecified (HCC), History of COVID-19, Hyponatremia, Insomnia, Pneumonia (2016), Seasonal allergic rhinitis, Seizures (HCC), and Tobacco dependence.   Surgical History:   Past Surgical History:  Procedure Laterality Date   CYSTECTOMY Right 2024   RIGHT SHOULDER   ESOPHAGOGASTRODUODENOSCOPY N/A 02/03/2014   Procedure: ESOPHAGOGASTRODUODENOSCOPY (EGD);  Surgeon: Gwendlyn ONEIDA Buddy, MD;  Location: [facility] ENDOSCOPY;  Service: Endoscopy;  Laterality: N/A;  bedside/trach/vent   MOUTH SURGERY     teeth removed.   PEG PLACEMENT N/A 02/03/2014   Procedure: PERCUTANEOUS ENDOSCOPIC GASTROSTOMY (PEG) PLACEMENT;  Surgeon: Gwendlyn ONEIDA Buddy, MD;  Location: [facility] ENDOSCOPY;  Service: Endoscopy;  Laterality: N/A;   SEPARATED LEFT SHOULDER     TRACHEOSTOMY  01/30/2014   [facility]     Social History:   reports that he has been smoking cigarettes. He started smoking about 29 years ago. He has a 20 pack-year smoking history. He has never used smokeless tobacco. He reports current alcohol use. He reports that he does not use drugs.  Family History:  His family history includes Cancer in his father and mother.   Allergies Allergies  Allergen Reactions   Amlodipine  Swelling    Significant leg swelling   Citalopram Hydrobromide     MADE ME FEEL HIGH     Home Medications  Prior to Admission medications   Medication Sig Start Date End Date Taking? Authorizing Provider  albuterol  (PROVENTIL  HFA;VENTOLIN  HFA) 108 (90 Base) MCG/ACT inhaler Inhale 2 puffs into the lungs every 6 (six) hours as needed for wheezing or shortness of breath.    [provider]  Bisoprolol  Fumarate 2.5 MG TABS Take 2.5 mg by mouth daily at 8 pm. 10/22/23   Swinyer, Rosaline HERO, NP  budesonide-formoterol (SYMBICORT) 160-4.5 MCG/ACT inhaler Inhale 2 puffs into the lungs 2 (two) times daily.    [provider]  Ensure (ENSURE) Take 1 Can by mouth.    [provider]  ferrous sulfate  324 MG TBEC Take 324 mg by mouth. Tries taking 1 tab daily. Patient taking differently: Take 324 mg by mouth every other day. Tries taking 1 tab daily.    [provider]  ibuprofen (ADVIL) 400 MG tablet Take 400 mg by mouth 3 (three) times daily.    [provider]  loratadine (CLARITIN) 10 MG tablet Take 10 mg by mouth daily.    [provider]  losartan (COZAAR) 100 MG tablet Take 100 mg by mouth daily.    [provider]  montelukast (SINGULAIR) 10 MG tablet Take 10 mg by mouth at bedtime.    [provider]  pantoprazole  (PROTONIX ) 40 MG tablet Take 40 mg by mouth daily. 01/01/23   [provider]  rosuvastatin  (CRESTOR ) 5 MG tablet Take 1 tablet (5 mg total) by mouth daily. 12/22/22 10/04/23  Swinyer, Rosaline HERO, NP     Critical care time: 64    The patient is critically ill with multiple organ system failure and requires high complexity decision making for assessment and support, frequent evaluation and titration of therapies, advanced monitoring, review of radiographic studies and interpretation of complex data.    Critical Care Time devoted to patient care services, exclusive of separately billable procedures, described in this note is 35  Tinnie FORBES Adolph DEVONNA [facility] 11/30/23 11:23 AM  Please see [email or web address] for pager details.  From 7A-7P if no response, please call [phone] After hours, please call ELink [phone]

## 2023-11-30 NOTE — Progress Notes (Signed)
 eLink Physician-Brief Progress Note Patient Name: Allen Wood DOB: Jan 07, 1971 MRN: [id number]   Date of Service  11/30/2023  HPI/Events of Note  Sodium levels downtrending, 123 at noon and now 119 despite escalation of normal saline therapy.  Drinking a lot of soda  eICU Interventions  Free water  limitation-modified diet to 1200 cc fluid restriction     Intervention Category Intermediate Interventions: Electrolyte abnormality - evaluation and management  Adean Milosevic 11/30/2023, 10:32 PM

## 2023-11-30 NOTE — Progress Notes (Addendum)
 Na+ 123; dropping saline to 25cc/h. Repeat BMP at 4pm. D/w covering RN.  Allen SHAUNNA Gaskins, DO 11/30/23 1:39 PM [name]   4pm BMP: Na+ back to 120 again. Increase NS to 50cc/h Recheck 8pm Avoid excessive PO water  intake  Allen SHAUNNA Gaskins, DO 11/30/23 5:35 PM [facility]  For contact information, see Amion. If no response to pager, please call PCCM consult pager. After hours, 7PM- 7AM, please call Elink.

## 2023-12-01 DIAGNOSIS — W19XXXS Unspecified fall, sequela: Secondary | ICD-10-CM | POA: Diagnosis not present

## 2023-12-01 DIAGNOSIS — R739 Hyperglycemia, unspecified: Secondary | ICD-10-CM | POA: Diagnosis not present

## 2023-12-01 DIAGNOSIS — F1010 Alcohol abuse, uncomplicated: Secondary | ICD-10-CM | POA: Diagnosis not present

## 2023-12-01 DIAGNOSIS — D509 Iron deficiency anemia, unspecified: Secondary | ICD-10-CM | POA: Diagnosis not present

## 2023-12-01 DIAGNOSIS — G9341 Metabolic encephalopathy: Secondary | ICD-10-CM | POA: Diagnosis not present

## 2023-12-01 DIAGNOSIS — E871 Hypo-osmolality and hyponatremia: Secondary | ICD-10-CM | POA: Diagnosis not present

## 2023-12-01 DIAGNOSIS — I10 Essential (primary) hypertension: Secondary | ICD-10-CM | POA: Diagnosis not present

## 2023-12-01 DIAGNOSIS — E43 Unspecified severe protein-calorie malnutrition: Secondary | ICD-10-CM | POA: Diagnosis not present

## 2023-12-01 LAB — BASIC METABOLIC PANEL WITH GFR
Anion gap: 10 (ref 5–15)
Anion gap: 9 (ref 5–15)
Anion gap: 8 (ref 5–15)
Anion gap: 7 (ref 5–15)
Anion gap: 8 (ref 5–15)
Anion gap: 9 (ref 5–15)
BUN: 16 mg/dL (ref 6–20)
BUN: 14 mg/dL (ref 6–20)
BUN: 14 mg/dL (ref 6–20)
BUN: 13 mg/dL (ref 6–20)
BUN: 13 mg/dL (ref 6–20)
BUN: 13 mg/dL (ref 6–20)
CO2: 24 mmol/L (ref 22–32)
CO2: 25 mmol/L (ref 22–32)
CO2: 25 mmol/L (ref 22–32)
CO2: 26 mmol/L (ref 22–32)
CO2: 24 mmol/L (ref 22–32)
CO2: 23 mmol/L (ref 22–32)
Calcium: 7.4 mg/dL — ABNORMAL LOW (ref 8.9–10.3)
Calcium: 7.5 mg/dL — ABNORMAL LOW (ref 8.9–10.3)
Calcium: 7.6 mg/dL — ABNORMAL LOW (ref 8.9–10.3)
Calcium: 7.9 mg/dL — ABNORMAL LOW (ref 8.9–10.3)
Calcium: 7.5 mg/dL — ABNORMAL LOW (ref 8.9–10.3)
Calcium: 7.2 mg/dL — ABNORMAL LOW (ref 8.9–10.3)
Chloride: 88 mmol/L — ABNORMAL LOW (ref 98–111)
Chloride: 88 mmol/L — ABNORMAL LOW (ref 98–111)
Chloride: 89 mmol/L — ABNORMAL LOW (ref 98–111)
Chloride: 86 mmol/L — ABNORMAL LOW (ref 98–111)
Chloride: 89 mmol/L — ABNORMAL LOW (ref 98–111)
Chloride: 92 mmol/L — ABNORMAL LOW (ref 98–111)
Creatinine, Ser: 0.93 mg/dL (ref 0.61–1.24)
Creatinine, Ser: 0.89 mg/dL (ref 0.61–1.24)
Creatinine, Ser: 0.85 mg/dL (ref 0.61–1.24)
Creatinine, Ser: 0.81 mg/dL (ref 0.61–1.24)
Creatinine, Ser: 0.89 mg/dL (ref 0.61–1.24)
Creatinine, Ser: 0.85 mg/dL (ref 0.61–1.24)
GFR, Estimated: >60 mL/min (ref >60)
GFR, Estimated: >60 mL/min (ref >60)
GFR, Estimated: >60 mL/min (ref >60)
GFR, Estimated: >60 mL/min (ref >60)
GFR, Estimated: >60 mL/min (ref >60)
GFR, Estimated: >60 mL/min (ref >60)
Glucose, Bld: 122 mg/dL — ABNORMAL HIGH (ref 70–99)
Glucose, Bld: 89 mg/dL (ref 70–99)
Glucose, Bld: 90 mg/dL (ref 70–99)
Glucose, Bld: 58 mg/dL — ABNORMAL LOW (ref 70–99)
Glucose, Bld: 109 mg/dL — ABNORMAL HIGH (ref 70–99)
Glucose, Bld: 135 mg/dL — ABNORMAL HIGH (ref 70–99)
Potassium: 4.1 mmol/L (ref 3.5–5.1)
Potassium: 4.2 mmol/L (ref 3.5–5.1)
Potassium: 4.3 mmol/L (ref 3.5–5.1)
Potassium: 4 mmol/L (ref 3.5–5.1)
Potassium: 3.8 mmol/L (ref 3.5–5.1)
Potassium: 3.7 mmol/L (ref 3.5–5.1)
Sodium: 122 mmol/L — ABNORMAL LOW (ref 135–145)
Sodium: 122 mmol/L — ABNORMAL LOW (ref 135–145)
Sodium: 122 mmol/L — ABNORMAL LOW (ref 135–145)
Sodium: 119 mmol/L — CL (ref 135–145)
Sodium: 121 mmol/L — ABNORMAL LOW (ref 135–145)
Sodium: 124 mmol/L — ABNORMAL LOW (ref 135–145)

## 2023-12-01 LAB — CBC
HCT: 27.5 % — ABNORMAL LOW (ref 39.0–52.0)
Hemoglobin: 9.5 g/dL — ABNORMAL LOW (ref 13.0–17.0)
MCH: 32.1 pg (ref 26.0–34.0)
MCHC: 34.5 g/dL (ref 30.0–36.0)
MCV: 92.9 fL (ref 80.0–100.0)
Platelets: 289 K/uL (ref 150–400)
RBC: 2.96 MIL/uL — ABNORMAL LOW (ref 4.22–5.81)
RDW: 12.2 % (ref 11.5–15.5)
WBC: 6.8 K/uL (ref 4.0–10.5)
nRBC: 0 % (ref 0.0–0.2)

## 2023-12-01 LAB — GLUCOSE, CAPILLARY
Glucose-Capillary: 102 mg/dL — ABNORMAL HIGH (ref 70–99)
Glucose-Capillary: 124 mg/dL — ABNORMAL HIGH (ref 70–99)
Glucose-Capillary: 136 mg/dL — ABNORMAL HIGH (ref 70–99)
Glucose-Capillary: 150 mg/dL — ABNORMAL HIGH (ref 70–99)
Glucose-Capillary: 220 mg/dL — ABNORMAL HIGH (ref 70–99)
Glucose-Capillary: 99 mg/dL (ref 70–99)

## 2023-12-01 LAB — PHOSPHORUS: Phosphorus: 3.8 mg/dL (ref 2.5–4.6)

## 2023-12-01 LAB — MAGNESIUM: Magnesium: 1.6 mg/dL — ABNORMAL LOW (ref 1.7–2.4)

## 2023-12-01 MED ORDER — SODIUM CHLORIDE 0.9 % IV SOLN: INTRAVENOUS | Status: AC

## 2023-12-01 MED ORDER — BENZOCAINE 10 % MT GEL
Freq: Two times a day (BID) | OROMUCOSAL | Status: DC | PRN
Start: 2023-12-01 — End: 2023-12-06
  Filled 2023-12-01: qty 9

## 2023-12-01 MED ORDER — ORAL CARE MOUTH RINSE: 15.0000 mL | OROMUCOSAL | Status: DC | PRN

## 2023-12-01 MED ORDER — MAGNESIUM SULFATE 2 GM/50ML IV SOLN
2.0000 g | Freq: Once | INTRAVENOUS | Status: AC
  Administered 2023-12-01: 2 g via INTRAVENOUS
  Filled 2023-12-01: qty 50

## 2023-12-01 NOTE — Progress Notes (Signed)
 NAME:  Allen Wood, MRN:  [id number], DOB:  [birth year], LOS: 3 ADMISSION DATE:  11/27/2023, CONSULTATION DATE:  11/2  REFERRING: Leita Chancy PA   CHIEF COMPLAINT: Dizziness   History of Present Illness:  Patient is a 53 yr old male with past medical hx significant of etoh abuse, seizures due to ETOH withdrawal in 2016-2017, HTN, HLD, anemia-IDA, COPD, tobacco abuse, anxiety, and depression who presents to [facility] ED due to by EMS due to dizziness that has been ongoing for a week. Patient also endorsed that he had seen PCP earlier. Upon initial lab work up, CT of head negative but patient noted to have severe hyponatremia. PCCM consulted for ICU admission in setting of severe hyponatremia- Na 102, requiring 3% saline and frequent neurochecks.   Upon, initial assessment. Patient drowsy but able to answer questions. Patient states that his last drink of alcohol was early this afternoon. Patient had consumed  pint of bourbon. Patient states usually drinks bourbon on the weekends, at least  pint of bourbon daily on weekend days. During the week, he states he drinks mainly beer but unable to provide quantity. Patient mentions that he did try to quit once and was admitted for withdrawals and having seizure in relation to. He believes this was anywhere from 5-10 yrs ago. Patient not taking anything for seizures. Patient endorses that he smokes almost 1pack of cigarettes a day but could not tell over how many years ('per patient since he was young'). Patient endorses an episode of vomiting 3 days ago but has not happened since. Patient denies any bleeding (hemoptysis, hematemesis, hematochezia), sick contacts, or recent falls. Lives with his wife and son.   Pertinent  Medical History   Past Medical History:  Diagnosis Date   Alcohol abuse    Anemia    Anxiety    Aortic atherosclerosis    Chronic bronchitis (HCC)    COPD with emphysema (HCC)    Delirium tremens (HCC)    Depression    Elevated blood  pressure reading without diagnosis of hypertension    Elevated LFTs    Emphysema, unspecified (HCC)    History of COVID-19    Hyponatremia    Insomnia    Pneumonia 2016   septic shock, trach/PEG/chest tube, both resolved   Seasonal allergic rhinitis    Seizures (HCC)    while intubated with PNA in 2016; post-ETOH in 2017   Tobacco dependence      Significant Hospital Events: Including procedures, antibiotic start and stop dates in addition to other pertinent events   11/2 PCCM admit for severe hyponatremia  10/4: overnight x1 3% saline bolus   Interim History / Subjective:  NAEON; more awake today and oob eating breakfast. On room air. Will transfer out Objective    Blood pressure (!) 151/84, pulse 87, temperature 98.9 F (37.2 C), temperature source Axillary, resp. rate 15, height 5' 11 (1.803 m), weight 79.4 kg, SpO2 100%.        Intake/Output Summary (Last 24 hours) at 12/01/2023 0856 Last data filed at 12/01/2023 0800 Gross per 24 hour  Intake 1019.79 ml  Output 1400 ml  Net -380.21 ml   Filed Weights   11/28/23 0400 11/30/23 0500 12/01/23 0500  Weight: 77.2 kg 78.6 kg 79.4 kg    Examination: General: chronically ill appearing, middle aged male, sitting oob in chair, NAD HEENT: ncat, perrla, anicteric sclera  Pulm: expiratory rhonchi no wheezing, on room air, no resp distress  CV: s1s2, no murmur, rub,  gallop Abd: rounded, soft, +BS, non-teder Extremities: no edema, warm  Neuro: awake, alert, oriented, non focal; no tremor present  GU: na  Resolved problem list   Assessment and Plan   Severe Hyponatremia suspect in setting of ETOH abuse with poor nutrition  Beer Potomania  -Na 102 (original per istat),Urine Osm 190, serum osmo 233, Sodium < 30. 11/4 corrected to 121  P:  - con't 0.9% NS @ 50cc/hr  - try and balance free water  intake with soda/juice to provide solute  - may need increase in rate if he is not eating but has been consistently taking PO -  Na q6h  - seizure precautions   Acute Metabolic Enceph secondary to Hyponatremia and ETOH abuse > improving  ETOH abuse Hx of Seizures in setting of withdrawals  Per patient hx of seizures 5-10 yrs ago in setting of withdrawal  -CT head negative  P: - hyponatremia treatment as above  - con't phenobarbital taper  - prn ativan   - wernicke thiamine  dosing, folic acid , mtvn  - seizure and delirium precautions   COPD  Tobacco Abuse: Home meds Symbicort, singular and prn albuterol   P: - nicotine  patch  - Anoro inhaler  - con't singulair 10mg  daily  - avoid ICS with COPD  - add prn duoneb  - recommend smoking Cessation   GERD P: - not on home ppi  - protonix  40mg  daily   Elevated BNP  Biatrial enlargement noted on EKG  Probable element of CHF secondary to ETOH use/HTN P: - echo yesterday with EF 55-60%, G1DD  HTN: P: - restarted his home Losartan at 25mg  daily - pressures have been okay (home dose 100mg  daily)  HLD P: - crestor  5mg  daily   IDA P: - con't home ferrous sulfate  325mg  daily   Anxiety Depression P:  - no home meds   Transfer out to progressive today, sign off to [facility] for 11/6 pick up Labs   CBC: Recent Labs  Lab 11/27/23 2247 11/27/23 2255 11/28/23 0858 11/30/23 0611 12/01/23 0438  WBC 5.7  --  2.9* 7.1 6.8  HGB 11.7* 12.2* 11.9* 10.4* 9.5*  HCT 31.3* 36.0* 31.0* 29.1* 27.5*  MCV 86.9  --  85.2 90.9 92.9  PLT 277  --  269 288 289    Basic Metabolic Panel: Recent Labs  Lab 11/28/23 0239 11/28/23 0858 11/28/23 1420 11/29/23 0944 11/29/23 1318 11/30/23 0611 11/30/23 0745 11/30/23 1606 11/30/23 2021 12/01/23 0038 12/01/23 0438 12/01/23 0803  NA  --  111*   < > 115*   < > 121*   < > 120* 119* 122* 122* 122*  K  --  4.7  --  3.4*   < > 4.2   < > 4.0 3.8 4.1 4.2 4.3  CL  --  73*  --  77*   < > 85*   < > 86* 87* 88* 88* 89*  CO2  --  25  --  27   < > 28   < > 26 24 24 25 25   GLUCOSE  --  117*  --  125*   < > 89   < > 171* 162* 122*  89 90  BUN  --  14  --  22*   < > 18   < > 18 17 16 14 14   CREATININE  --  1.08  --  1.42*   < > 1.02   < > 1.17 1.04 0.93 0.89 0.85  CALCIUM   --  8.2*  --  8.1*   < > 8.0*   < > 7.8* 7.3* 7.4* 7.5* 7.6*  MG 1.3* 2.0  --  1.7  --  1.8  --   --   --   --  1.6*  --   PHOS 3.8 5.3*  --  2.5  --  3.4  --   --   --   --  3.8  --    < > = values in this interval not displayed.   GFR: Estimated Creatinine Clearance: 104.6 mL/min (by C-G formula based on SCr of 0.85 mg/dL). Recent Labs  Lab 11/27/23 2247 11/28/23 0858 11/30/23 0611 12/01/23 0438  WBC 5.7 2.9* 7.1 6.8    Liver Function Tests: Recent Labs  Lab 11/28/23 0239  AST 70*  ALT 26  ALKPHOS 66  BILITOT 0.5  PROT 5.7*  ALBUMIN 2.9*   No results for input(s): LIPASE, AMYLASE in the last 168 hours. Recent Labs  Lab 11/28/23 0227 11/30/23 1000  AMMONIA 23 24    ABG    Component Value Date/Time   PHART 7.417 01/31/2014 1041   PCO2ART 34.1 (L) 01/31/2014 1041   PO2ART 40.0 (L) 01/31/2014 1041   HCO3 23.0 01/31/2014 1041   TCO2 25 11/27/2023 2255   ACIDBASEDEF 2.0 01/31/2014 1041   O2SAT 61.8 01/31/2014 1112     Coagulation Profile: No results for input(s): INR, PROTIME in the last 168 hours.  Cardiac Enzymes: No results for input(s): CKTOTAL, CKMB, CKMBINDEX, TROPONINI in the last 168 hours.  HbA1C: Hgb A1c MFr Bld  Date/Time Value Ref Range Status  11/28/2023 07:01 PM 4.6 (L) 4.8 - 5.6 % Final    Comment:    (NOTE) Diagnosis of Diabetes The following HbA1c ranges recommended by the American Diabetes Association (ADA) may be used as an aid in the diagnosis of diabetes mellitus.  Hemoglobin             Suggested A1C NGSP%              Diagnosis  <5.7                   Non Diabetic  5.7-6.4                Pre-Diabetic  >6.4                   Diabetic  <7.0                   Glycemic control for                       adults with diabetes.      CBG: Recent Labs  Lab  11/30/23 1138 11/30/23 1556 11/30/23 2032 11/30/23 2345 12/01/23 0415  GLUCAP 152* 134* 179* 142* 99    Review of Systems:   above  Past Medical History:  He,  has a past medical history of Alcohol abuse, Anemia, Anxiety, Aortic atherosclerosis, Chronic bronchitis (HCC), COPD with emphysema (HCC), Delirium tremens (HCC), Depression, Elevated blood pressure reading without diagnosis of hypertension, Elevated LFTs, Emphysema, unspecified (HCC), History of COVID-19, Hyponatremia, Insomnia, Pneumonia (2016), Seasonal allergic rhinitis, Seizures (HCC), and Tobacco dependence.   Surgical History:   Past Surgical History:  Procedure Laterality Date   CYSTECTOMY Right 2024   RIGHT SHOULDER   ESOPHAGOGASTRODUODENOSCOPY N/A 02/03/2014   Procedure: ESOPHAGOGASTRODUODENOSCOPY (EGD);  Surgeon: Gwendlyn ONEIDA Buddy, MD;  Location: [facility] ENDOSCOPY;  Service: Endoscopy;  Laterality: N/A;  bedside/trach/vent   MOUTH SURGERY     teeth removed.   PEG PLACEMENT N/A 02/03/2014   Procedure: PERCUTANEOUS ENDOSCOPIC GASTROSTOMY (PEG) PLACEMENT;  Surgeon: Gwendlyn ONEIDA Buddy, MD;  Location: [facility] ENDOSCOPY;  Service: Endoscopy;  Laterality: N/A;   SEPARATED LEFT SHOULDER     TRACHEOSTOMY  01/30/2014   feinstein     Social History:   reports that he has been smoking cigarettes. He started smoking about 29 years ago. He has a 20 pack-year smoking history. He has never used smokeless tobacco. He reports current alcohol use. He reports that he does not use drugs.   Family History:  His family history includes Cancer in his father and mother.   Allergies Allergies  Allergen Reactions   Celexa [Citalopram Hydrobromide] Other (See Comments)    Didn't like the way it made him feel (high).   Norvasc  [Amlodipine ] Swelling    Significant leg swelling     Home Medications  Prior to Admission medications   Medication Sig Start Date End Date Taking? Authorizing Provider  albuterol  (PROVENTIL  HFA;VENTOLIN  HFA) 108 (90  Base) MCG/ACT inhaler Inhale 2 puffs into the lungs every 6 (six) hours as needed for wheezing or shortness of breath.    [provider]  Bisoprolol  Fumarate 2.5 MG TABS Take 2.5 mg by mouth daily at 8 pm. 10/22/23   Swinyer, Rosaline HERO, NP  budesonide-formoterol (SYMBICORT) 160-4.5 MCG/ACT inhaler Inhale 2 puffs into the lungs 2 (two) times daily.    [provider]  Ensure (ENSURE) Take 1 Can by mouth.    [provider]  ferrous sulfate  324 MG TBEC Take 324 mg by mouth. Tries taking 1 tab daily. Patient taking differently: Take 324 mg by mouth every other day. Tries taking 1 tab daily.    [provider]  ibuprofen (ADVIL) 400 MG tablet Take 400 mg by mouth 3 (three) times daily.    [provider]  loratadine (CLARITIN) 10 MG tablet Take 10 mg by mouth daily.    [provider]  losartan (COZAAR) 100 MG tablet Take 100 mg by mouth daily.    [provider]  montelukast (SINGULAIR) 10 MG tablet Take 10 mg by mouth at bedtime.    [provider]  pantoprazole  (PROTONIX ) 40 MG tablet Take 40 mg by mouth daily. 01/01/23   [provider]  rosuvastatin  (CRESTOR ) 5 MG tablet Take 1 tablet (5 mg total) by mouth daily. 12/22/22 10/04/23  Swinyer, Rosaline HERO, NP     Critical care time: 22    The patient is critically ill with multiple organ system failure and requires high complexity decision making for assessment and support, frequent evaluation and titration of therapies, advanced monitoring, review of radiographic studies and interpretation of complex data.    Critical Care Time devoted to patient care services, exclusive of separately billable procedures, described in this note is 32  Tinnie FORBES Adolph DEVONNA [facility] 12/01/23 8:56 AM  Please see [email or web address] for pager details.  From 7A-7P if no response, please call [phone] After hours, please call ELink [phone]

## 2023-12-01 NOTE — Evaluation (Signed)
 Physical Therapy Evaluation Patient Details Name: Allen Wood MRN: [id number] DOB: 1970-12-03 Today's Date: 12/01/2023  History of Present Illness  Patient is a 53 yr old male  who presented to [facility] ED due to dizziness that was ongoing for a week. Upon initial lab work up, CT of head negative but patient noted to have severe hyponatremia. PMH: etoh abuse, seizures due to ETOH withdrawal in 2016-2017, HTN, HLD, anemia-IDA, COPD, tobacco abuse, anxiety, and depression  Clinical Impression  Pt admitted with above diagnosis. Pt was able to ambulate with RW with CGA and min cues for safety.  Min assist without device.  If wife can assist at home should be able to go home with RW and HHPT f/u. Will follow acutely.  Pt currently with functional limitations due to the deficits listed below (see PT Problem List). Pt will benefit from acute skilled PT to increase their independence and safety with mobility to allow discharge.           If plan is discharge home, recommend the following: A little help with walking and/or transfers;A little help with bathing/dressing/bathroom;Assistance with cooking/housework;Help with stairs or ramp for entrance;Assist for transportation   Can travel by private vehicle        Equipment Recommendations Rolling walker (2 wheels)  Recommendations for Other Services       Functional Status Assessment Patient has had a recent decline in their functional status and demonstrates the ability to make significant improvements in function in a reasonable and predictable amount of time.     Precautions / Restrictions Precautions Precautions: Fall Recall of Precautions/Restrictions: Intact Restrictions Weight Bearing Restrictions Per Provider Order: No      Mobility  Bed Mobility Overal bed mobility: Independent                  Transfers Overall transfer level: Needs assistance Equipment used: Rolling walker (2 wheels) Transfers: Sit to/from Stand Sit to  Stand: Min assist                Ambulation/Gait Ambulation/Gait assistance: Min assist Gait Distance (Feet): 380 Feet Assistive device: Rolling walker (2 wheels), None Gait Pattern/deviations: Step-through pattern, Decreased stride length, Shuffle, Drifts right/left, Trunk flexed   Gait velocity interpretation: <1.31 ft/sec, indicative of household ambulator   General Gait Details: Pt was able to ambulate with RW on unit with fair balance with RW needing cues at times for safety.  Without RW, pt with loss of balance. Pt did not use device PTA. Poor safety awareness overall.  Stairs            Wheelchair Mobility     Tilt Bed    Modified Rankin (Stroke Patients Only)       Balance Overall balance assessment: Needs assistance Sitting-balance support: No upper extremity supported, Feet supported Sitting balance-Leahy Scale: Fair     Standing balance support: Bilateral upper extremity supported, During functional activity, Reliant on assistive device for balance Standing balance-Leahy Scale: Poor Standing balance comment: relies on device for balance                             Pertinent Vitals/Pain Pain Assessment Pain Assessment: No/denies pain    Home Living Family/patient expects to be discharged to:: Private residence Living Arrangements: Spouse/significant other Available Help at Discharge: Family;Available 24 hours/day Type of Home: House Home Access: Stairs to enter Entrance Stairs-Rails: Right;Left;Can reach both Entrance Stairs-Number of Steps: 5  Home Layout: One level Home Equipment: None Additional Comments: Lives with wife and son    Prior Function Prior Level of Function : Independent/Modified Independent                     Extremity/Trunk Assessment   Upper Extremity Assessment Upper Extremity Assessment: Defer to OT evaluation    Lower Extremity Assessment Lower Extremity Assessment: Generalized weakness     Cervical / Trunk Assessment Cervical / Trunk Assessment: Kyphotic;Other exceptions (holds neck side flexed left and anteriorly flexed)  Communication   Communication Communication: No apparent difficulties    Cognition Arousal: Alert Behavior During Therapy: WFL for tasks assessed/performed   PT - Cognitive impairments: No apparent impairments                         Following commands: Intact       Cueing       General Comments General comments (skin integrity, edema, etc.): VSS    Exercises General Exercises - Lower Extremity Ankle Circles/Pumps: AROM, Both, 5 reps, Supine Long Arc Quad: AROM, Both, 5 reps, Seated   Assessment/Plan    PT Assessment Patient needs continued PT services  PT Problem List Decreased activity tolerance;Decreased balance;Decreased mobility;Decreased knowledge of use of DME;Decreased safety awareness;Decreased knowledge of precautions;Cardiopulmonary status limiting activity       PT Treatment Interventions DME instruction;Gait training;Functional mobility training;Therapeutic activities;Therapeutic exercise;Balance training;Patient/family education;Wheelchair mobility training    PT Goals (Current goals can be found in the Care Plan section)  Acute Rehab PT Goals Patient Stated Goal: to go home PT Goal Formulation: With patient Time For Goal Achievement: 12/15/23 Potential to Achieve Goals: Good    Frequency Min 2X/week     Co-evaluation               AM-PAC PT 6 Clicks Mobility  Outcome Measure Help needed turning from your back to your side while in a flat bed without using bedrails?: None Help needed moving from lying on your back to sitting on the side of a flat bed without using bedrails?: A Little Help needed moving to and from a bed to a chair (including a wheelchair)?: A Little Help needed standing up from a chair using your arms (e.g., wheelchair or bedside chair)?: A Little Help needed to walk in  hospital room?: A Little Help needed climbing 3-5 steps with a railing? : A Lot 6 Click Score: 18    End of Session Equipment Utilized During Treatment: Gait belt Activity Tolerance: Patient limited by fatigue Patient left: with call bell/phone within reach;in bed Nurse Communication: Mobility status PT Visit Diagnosis: Muscle weakness (generalized) (M62.81);Unsteadiness on feet (R26.81)    Time: 1203-1226 PT Time Calculation (min) (ACUTE ONLY): 23 min   Charges:   PT Evaluation $PT Eval Moderate Complexity: 1 Mod PT Treatments $Gait Training: 8-22 mins PT General Charges $$ ACUTE PT VISIT: 1 Visit         Genella Bas M,PT Acute Rehab Services [phone]   Stephane JULIANNA Bevel 12/01/2023, 2:31 PM

## 2023-12-02 DIAGNOSIS — E871 Hypo-osmolality and hyponatremia: Secondary | ICD-10-CM

## 2023-12-02 LAB — BASIC METABOLIC PANEL WITH GFR
Anion gap: 5 (ref 5–15)
Anion gap: 6 (ref 5–15)
Anion gap: 7 (ref 5–15)
Anion gap: 7 (ref 5–15)
Anion gap: 7 (ref 5–15)
Anion gap: 8 (ref 5–15)
BUN: 11 mg/dL (ref 6–20)
BUN: 8 mg/dL (ref 6–20)
BUN: 9 mg/dL (ref 6–20)
BUN: 9 mg/dL (ref 6–20)
BUN: 9 mg/dL (ref 6–20)
BUN: 9 mg/dL (ref 6–20)
CO2: 22 mmol/L (ref 22–32)
CO2: 22 mmol/L (ref 22–32)
CO2: 23 mmol/L (ref 22–32)
CO2: 23 mmol/L (ref 22–32)
CO2: 24 mmol/L (ref 22–32)
CO2: 25 mmol/L (ref 22–32)
Calcium: 6.9 mg/dL — ABNORMAL LOW (ref 8.9–10.3)
Calcium: 7.1 mg/dL — ABNORMAL LOW (ref 8.9–10.3)
Calcium: 7.6 mg/dL — ABNORMAL LOW (ref 8.9–10.3)
Calcium: 7.7 mg/dL — ABNORMAL LOW (ref 8.9–10.3)
Calcium: 7.7 mg/dL — ABNORMAL LOW (ref 8.9–10.3)
Calcium: 7.8 mg/dL — ABNORMAL LOW (ref 8.9–10.3)
Chloride: 93 mmol/L — ABNORMAL LOW (ref 98–111)
Chloride: 94 mmol/L — ABNORMAL LOW (ref 98–111)
Chloride: 94 mmol/L — ABNORMAL LOW (ref 98–111)
Chloride: 95 mmol/L — ABNORMAL LOW (ref 98–111)
Chloride: 96 mmol/L — ABNORMAL LOW (ref 98–111)
Chloride: 97 mmol/L — ABNORMAL LOW (ref 98–111)
Creatinine, Ser: 0.7 mg/dL (ref 0.61–1.24)
Creatinine, Ser: 0.79 mg/dL (ref 0.61–1.24)
Creatinine, Ser: 0.8 mg/dL (ref 0.61–1.24)
Creatinine, Ser: 0.85 mg/dL (ref 0.61–1.24)
Creatinine, Ser: 0.96 mg/dL (ref 0.61–1.24)
Creatinine, Ser: 0.98 mg/dL (ref 0.61–1.24)
GFR, Estimated: >60 mL/min (ref >60)
GFR, Estimated: >60 mL/min (ref >60)
GFR, Estimated: >60 mL/min (ref >60)
GFR, Estimated: >60 mL/min (ref >60)
GFR, Estimated: >60 mL/min (ref >60)
GFR, Estimated: >60 mL/min (ref >60)
Glucose, Bld: 102 mg/dL — ABNORMAL HIGH (ref 70–99)
Glucose, Bld: 120 mg/dL — ABNORMAL HIGH (ref 70–99)
Glucose, Bld: 141 mg/dL — ABNORMAL HIGH (ref 70–99)
Glucose, Bld: 144 mg/dL — ABNORMAL HIGH (ref 70–99)
Glucose, Bld: 74 mg/dL (ref 70–99)
Glucose, Bld: 92 mg/dL (ref 70–99)
Potassium: 3.7 mmol/L (ref 3.5–5.1)
Potassium: 3.8 mmol/L (ref 3.5–5.1)
Potassium: 4.3 mmol/L (ref 3.5–5.1)
Potassium: 4.5 mmol/L (ref 3.5–5.1)
Potassium: 4.5 mmol/L (ref 3.5–5.1)
Potassium: 4.7 mmol/L (ref 3.5–5.1)
Sodium: 123 mmol/L — ABNORMAL LOW (ref 135–145)
Sodium: 124 mmol/L — ABNORMAL LOW (ref 135–145)
Sodium: 124 mmol/L — ABNORMAL LOW (ref 135–145)
Sodium: 125 mmol/L — ABNORMAL LOW (ref 135–145)
Sodium: 125 mmol/L — ABNORMAL LOW (ref 135–145)
Sodium: 127 mmol/L — ABNORMAL LOW (ref 135–145)

## 2023-12-02 LAB — CBC
HCT: 24.8 % — ABNORMAL LOW (ref 39.0–52.0)
Hemoglobin: 8.4 g/dL — ABNORMAL LOW (ref 13.0–17.0)
MCH: 32.1 pg (ref 26.0–34.0)
MCHC: 33.9 g/dL (ref 30.0–36.0)
MCV: 94.7 fL (ref 80.0–100.0)
Platelets: 267 K/uL (ref 150–400)
RBC: 2.62 MIL/uL — ABNORMAL LOW (ref 4.22–5.81)
RDW: 12.5 % (ref 11.5–15.5)
WBC: 6.2 K/uL (ref 4.0–10.5)
nRBC: 0 % (ref 0.0–0.2)

## 2023-12-02 LAB — SODIUM, URINE, RANDOM: Sodium, Ur: 89 mmol/L

## 2023-12-02 LAB — GLUCOSE, CAPILLARY
Glucose-Capillary: 103 mg/dL — ABNORMAL HIGH (ref 70–99)
Glucose-Capillary: 138 mg/dL — ABNORMAL HIGH (ref 70–99)
Glucose-Capillary: 168 mg/dL — ABNORMAL HIGH (ref 70–99)
Glucose-Capillary: 69 mg/dL — ABNORMAL LOW (ref 70–99)
Glucose-Capillary: 73 mg/dL (ref 70–99)
Glucose-Capillary: 94 mg/dL (ref 70–99)
Glucose-Capillary: 95 mg/dL (ref 70–99)

## 2023-12-02 LAB — PHOSPHORUS: Phosphorus: 3 mg/dL (ref 2.5–4.6)

## 2023-12-02 LAB — MAGNESIUM: Magnesium: 1.3 mg/dL — ABNORMAL LOW (ref 1.7–2.4)

## 2023-12-02 LAB — OSMOLALITY, URINE: Osmolality, Ur: 393 mosm/kg (ref 300–900)

## 2023-12-02 MED ORDER — SODIUM CHLORIDE 1 G PO TABS
2.0000 g | ORAL_TABLET | Freq: Three times a day (TID) | ORAL | Status: DC
  Administered 2023-12-02 – 2023-12-06 (×13): 2 g via ORAL
  Filled 2023-12-02 (×14): qty 2

## 2023-12-02 MED ORDER — PHENOBARBITAL SODIUM 130 MG/ML IJ SOLN
130.0000 mg | Freq: Once | INTRAMUSCULAR | Status: AC
  Administered 2023-12-03: 130 mg via INTRAVENOUS
  Filled 2023-12-02: qty 1

## 2023-12-02 MED ORDER — POTASSIUM CHLORIDE 20 MEQ PO PACK
40.0000 meq | PACK | Freq: Once | ORAL | Status: AC
  Administered 2023-12-02: 40 meq via ORAL
  Filled 2023-12-02: qty 2

## 2023-12-02 MED ORDER — LABETALOL HCL 5 MG/ML IV SOLN
20.0000 mg | INTRAVENOUS | Status: DC | PRN
Start: 2023-12-02 — End: 2023-12-06
  Administered 2023-12-03 – 2023-12-06 (×5): 20 mg via INTRAVENOUS
  Filled 2023-12-02 (×6): qty 4

## 2023-12-02 MED ORDER — MAGNESIUM SULFATE 50 % IJ SOLN
6.0000 g | Freq: Once | INTRAVENOUS | Status: AC
  Administered 2023-12-02: 6 g via INTRAVENOUS
  Filled 2023-12-02: qty 10

## 2023-12-02 NOTE — Progress Notes (Signed)
 PROGRESS NOTE Allen Wood  FMW:[id number] DOB: [birth year] DOA: 11/27/2023 PCP: Arloa Elsie SAUNDERS, MD  Brief Narrative/Hospital Course: Allen Wood is a 53 y.o. male with PMH of etoh abuse, seizures due to ETOH withdrawal in 2016-2017, HTN, HLD, anemia-IDA, COPD, tobacco abuse, anxiety, and depression who presented to [facility] ED due to by EMS due to dizziness that has been ongoing for a week,and had seen PCP earlier. He usually drinks bourbon on the weekends, at least  pint of bourbon daily on weekend days. Patient was found to have acute encephalopathy, fall in the setting of severe hyponatremia CT of head negative . Patient admitted to ICU on 11/28/2023> received 3% saline subsequently switched NS, hyponatremia stabilized, patient was also treated with phenobarbital taper for alcohol abuse with withdrawal 11/6> transferred to TRH service  Subjective: Seen and examined today Feeling shaky today and Trembly on walking Overnight on room air afebrile, VSS, Labs sodium 127, mag 1.3 hemoglobin 8.5 drifting-electrolytes replaced Repeat labs pending Family at the bedside  Assessment and plan:  Symptomatic hyponatremia Acute encephalopathy due to hyponatremia/alcohol abuse: CT head negative.  S/p 3% saline, now on normal saline and sodium improved to 127 as below. AO x 3. cont to monitor, avoid rapid correction-if sodium stabilizes-repeat BMP pending this morning> currently resulted at 124, add salt tablets 2 g  TID, continue IVF and monitor sodium closely. Recent Labs  Lab 12/01/23 1612 12/01/23 1947 12/01/23 2346 12/02/23 0428 12/02/23 1031  NA 121* 124* 123* 127* 124*    Hypomagnesemia Replaced.  Recheck  Normocytic anemia: Hemoglobin drifting, monitor.  Check anemia panel. Recent Labs    11/27/23 2255 11/28/23 0858 11/30/23 0611 12/01/23 0438 12/02/23 0428  HGB 12.2* 11.9* 10.4* 9.5* 8.4*  MCV  --  85.2 90.9 92.9 94.7     Alcohol abuse concern for withdrawals history of  seizures in setting of withdrawals: On phenobarbital taper, CIWA and multivitamins high-dose thiamine , counseling for cessation.   Monitor for withdrawals, somewhat tremulous today  Likely COPD Tobacco abuse: Continue Anoro needs outpatient PFTs.  Counseling for tobacco cessation.  Hypertension: Stable continue home losartan  Hyperlipidemia: On Crestor   GERD: Cont ppi.  Mobility: PT Orders: Active PT Follow up Rec: Home Health Pt11/05/2023 1400   DVT prophylaxis: heparin  injection 5,000 Units Start: 11/28/23 1400 SCDs Start: 11/28/23 0230 Code Status:   Code Status: Full Code Family Communication: plan of care discussed with patient/family at bedside. Patient status is: Remains hospitalized because of severity of illness Level of care: Progressive   Dispo: The patient is from: home            Anticipated disposition: TBD ~ 24 hrs Objective: Vitals last 24 hrs: Vitals:   12/02/23 0900 12/02/23 1000 12/02/23 1100 12/02/23 1106  BP: 131/81 104/83 131/89   Pulse: 89 100 87   Resp: 14 16 12    Temp:    98.5 F (36.9 C)  TempSrc:    Oral  SpO2: 99% 100% 97%   Weight:      Height:        Physical Examination: General exam: alert awake, oriented, older than stated age HEENT:Oral mucosa moist, Ear/Nose WNL grossly Respiratory system: Bilaterally clear BS,no use of accessory muscle Cardiovascular system: S1 & S2 +, No JVD. Gastrointestinal system: Abdomen soft,NT,ND, BS+ Nervous System: Alert, awake, moving all extremities,and following commands. Extremities: extremities warm, leg edema neg Skin: Warm, no rashes MSK: Normal muscle bulk,tone, power   Medications reviewed:  Scheduled Meds:  Chlorhexidine  Gluconate Cloth  6  each Topical Daily   ferrous sulfate   325 mg Oral Q breakfast   folic acid   1 mg Oral Daily   heparin   5,000 Units Subcutaneous Q8H   insulin aspart  0-15 Units Subcutaneous Q4H   losartan  25 mg Oral Daily   montelukast  10 mg Oral QHS    multivitamin with minerals  1 tablet Oral Daily   nicotine   21 mg Transdermal Daily   pantoprazole   40 mg Oral Daily   phenobarbital  32.4 mg Oral Q8H   rosuvastatin   5 mg Oral Daily   sodium chloride  flush  10-40 mL Intracatheter Q12H   sodium chloride   2 g Oral TID WC   thiamine   100 mg Oral Q24H   umeclidinium-vilanterol  1 puff Inhalation Daily   Continuous Infusions:  sodium chloride  125 mL/hr at 12/02/23 1100   thiamine  (VITAMIN B1) injection     Diet: Diet Order             Diet regular Room service appropriate? Yes; Fluid consistency: Thin; Fluid restriction: 1200 mL Fluid  Diet effective now                    Data Reviewed: I have personally reviewed following labs and imaging studies ( see epic result tab) CBC: Recent Labs  Lab 11/27/23 2247 11/27/23 2255 11/28/23 0858 11/30/23 0611 12/01/23 0438 12/02/23 0428  WBC 5.7  --  2.9* 7.1 6.8 6.2  HGB 11.7* 12.2* 11.9* 10.4* 9.5* 8.4*  HCT 31.3* 36.0* 31.0* 29.1* 27.5* 24.8*  MCV 86.9  --  85.2 90.9 92.9 94.7  PLT 277  --  269 288 289 267   CMP: Recent Labs  Lab 11/28/23 0858 11/28/23 1420 11/29/23 0944 11/29/23 1318 11/30/23 0611 11/30/23 0745 12/01/23 0438 12/01/23 0803 12/01/23 1612 12/01/23 1947 12/01/23 2346 12/02/23 0428 12/02/23 1031  NA 111*   < > 115*   < > 121*   < > 122*   < > 121* 124* 123* 127* 124*  K 4.7  --  3.4*   < > 4.2   < > 4.2   < > 3.8 3.7 3.8 3.7 4.7  CL 73*  --  77*   < > 85*   < > 88*   < > 89* 92* 93* 97* 94*  CO2 25  --  27   < > 28   < > 25   < > 24 23 24 23 25   GLUCOSE 117*  --  125*   < > 89   < > 89   < > 109* 135* 102* 74 92  BUN 14  --  22*   < > 18   < > 14   < > 13 13 11 9 9   CREATININE 1.08  --  1.42*   < > 1.02   < > 0.89   < > 0.89 0.85 0.79 0.70 0.98  CALCIUM  8.2*  --  8.1*   < > 8.0*   < > 7.5*   < > 7.5* 7.2* 7.1* 6.9* 7.8*  MG 2.0  --  1.7  --  1.8  --  1.6*  --   --   --   --  1.3*  --   PHOS 5.3*  --  2.5  --  3.4  --  3.8  --   --   --   --  3.0  --     < > = values in this  interval not displayed.   GFR: Estimated Creatinine Clearance: 90.7 mL/min (by C-G formula based on SCr of 0.98 mg/dL). Recent Labs  Lab 11/28/23 0239  AST 70*  ALT 26  ALKPHOS 66  BILITOT 0.5  PROT 5.7*  ALBUMIN 2.9*   No results for input(s): LIPASE, AMYLASE in the last 168 hours.  Recent Labs  Lab 11/28/23 0227 11/30/23 1000  AMMONIA 23 24   Coagulation Profile: No results for input(s): INR, PROTIME in the last 168 hours. Unresulted Labs (From admission, onward)     Start     Ordered   12/03/23 0500  Vitamin B12  (Anemia Panel (PNL))  Tomorrow morning,   R       Question:  Specimen collection method  Answer:  Unit=Unit collect   12/02/23 0800   12/03/23 0500  Folate  (Anemia Panel (PNL))  Tomorrow morning,   R       Question:  Specimen collection method  Answer:  Unit=Unit collect   12/02/23 0800   12/03/23 0500  Iron and TIBC  (Anemia Panel (PNL))  Tomorrow morning,   R       Question:  Specimen collection method  Answer:  Unit=Unit collect   12/02/23 0800   12/03/23 0500  Ferritin  (Anemia Panel (PNL))  Tomorrow morning,   R       Question:  Specimen collection method  Answer:  Unit=Unit collect   12/02/23 0800   12/03/23 0500  Reticulocytes  (Anemia Panel (PNL))  Tomorrow morning,   R       Question:  Specimen collection method  Answer:  Unit=Unit collect   12/02/23 0800   12/03/23 0500  CBC  Tomorrow morning,   R       Question:  Specimen collection method  Answer:  Unit=Unit collect   12/02/23 0800   12/03/23 0500  Magnesium   Tomorrow morning,   R       Question:  Specimen collection method  Answer:  Unit=Unit collect   12/02/23 0801   12/02/23 1140  Sodium, urine, random  Once,   R        12/02/23 1139   12/02/23 1140  Osmolality, urine  Once,   R        12/02/23 1139   12/01/23 1200  Basic metabolic panel with GFR  Now then every 4 hours,   R     Question:  Specimen collection method  Answer:  Lab=Lab collect   12/01/23 0855            Antimicrobials/Microbiology: Anti-infectives (From admission, onward)    None         Component Value Date/Time   SDES BLOOD RIGHT ANTECUBITAL 06/11/2015 1956   SPECREQUEST BOTTLES DRAWN AEROBIC AND ANAEROBIC 5CC 06/11/2015 1956   CULT  06/11/2015 1956    NO GROWTH 5 DAYS Performed at [facility]    REPTSTATUS 06/16/2015 FINAL 06/11/2015 1956    Procedures:    Mennie LAMY, MD [facility] 12/02/2023, 11:40 AM

## 2023-12-02 NOTE — Progress Notes (Addendum)
 eLink Physician-Brief Progress Note Patient Name: Allen Wood DOB: 09-07-1970 MRN: [id number]   Date of Service  12/02/2023  HPI/Events of Note  Symptomatic hyponatremia causing acute encephalopathy  123  eICU Interventions  Increase NS to 125cc/hr   0624 - Na 127, K/mg replacement  Intervention Category Intermediate Interventions: Electrolyte abnormality - evaluation and management  Allen Wood 12/02/2023, 12:43 AM

## 2023-12-02 NOTE — Hospital Course (Addendum)
 Allen Wood is a 53 y.o. male with PMH of etoh abuse, seizures due to ETOH withdrawal in 2016-2017, HTN, HLD, anemia-IDA, COPD, tobacco abuse, anxiety, and depression who presented to [facility] ED due to by EMS due to dizziness that has been ongoing for a week,and had seen PCP earlier. He usually drinks bourbon on the weekends, at least  pint of bourbon daily on weekend days. Patient was found to have acute encephalopathy, fall in the setting of severe hyponatremia CT of head negative . Patient admitted to ICU on 11/28/2023> received 3% saline subsequently switched NS, hyponatremia stabilized, patient was also treated with phenobarbital taper for alcohol abuse with withdrawal 11/6> transferred to TRH service  Subjective: Seen and examined today Feeling shaky today and Trembly on walking Overnight on room air afebrile, VSS, Labs sodium 127, mag 1.3 hemoglobin 8.5 drifting-electrolytes replaced Repeat labs pending Family at the bedside  Assessment and plan:  Symptomatic hyponatremia Acute encephalopathy due to hyponatremia/alcohol abuse: CT head negative.  S/p 3% saline, now on normal saline and sodium improved to 127 as below. AO x 3. cont to monitor, avoid rapid correction-if sodium stabilizes-repeat BMP pending this morning> currently resulted at 124, add salt tablets 2 g  TID, continue IVF and monitor sodium closely. Recent Labs  Lab 12/01/23 1612 12/01/23 1947 12/01/23 2346 12/02/23 0428 12/02/23 1031  NA 121* 124* 123* 127* 124*    Hypomagnesemia Replaced.  Recheck  Normocytic anemia: Hemoglobin drifting, monitor.  Check anemia panel. Recent Labs    11/27/23 2255 11/28/23 0858 11/30/23 0611 12/01/23 0438 12/02/23 0428  HGB 12.2* 11.9* 10.4* 9.5* 8.4*  MCV  --  85.2 90.9 92.9 94.7     Alcohol abuse concern for withdrawals history of seizures in setting of withdrawals: On phenobarbital taper, CIWA and multivitamins high-dose thiamine , counseling for cessation.   Monitor  for withdrawals, somewhat tremulous today  Likely COPD Tobacco abuse: Continue Anoro needs outpatient PFTs.  Counseling for tobacco cessation.  Hypertension: Stable continue home losartan  Hyperlipidemia: On Crestor   GERD: Cont ppi.  Mobility: PT Orders: Active PT Follow up Rec: Home Health Pt11/05/2023 1400   DVT prophylaxis: heparin  injection 5,000 Units Start: 11/28/23 1400 SCDs Start: 11/28/23 0230 Code Status:   Code Status: Full Code Family Communication: plan of care discussed with patient/family at bedside. Patient status is: Remains hospitalized because of severity of illness Level of care: Progressive   Dispo: The patient is from: home            Anticipated disposition: TBD ~ 24 hrs Objective: Vitals last 24 hrs: Vitals:   12/02/23 0900 12/02/23 1000 12/02/23 1100 12/02/23 1106  BP: 131/81 104/83 131/89   Pulse: 89 100 87   Resp: 14 16 12    Temp:    98.5 F (36.9 C)  TempSrc:    Oral  SpO2: 99% 100% 97%   Weight:      Height:        Physical Examination: General exam: alert awake, oriented, older than stated age HEENT:Oral mucosa moist, Ear/Nose WNL grossly Respiratory system: Bilaterally clear BS,no use of accessory muscle Cardiovascular system: S1 & S2 +, No JVD. Gastrointestinal system: Abdomen soft,NT,ND, BS+ Nervous System: Alert, awake, moving all extremities,and following commands. Extremities: extremities warm, leg edema neg Skin: Warm, no rashes MSK: Normal muscle bulk,tone, power   Medications reviewed:  Scheduled Meds:  Chlorhexidine  Gluconate Cloth  6 each Topical Daily   ferrous sulfate   325 mg Oral Q breakfast   folic acid   1  mg Oral Daily   heparin   5,000 Units Subcutaneous Q8H   insulin aspart  0-15 Units Subcutaneous Q4H   losartan  25 mg Oral Daily   montelukast  10 mg Oral QHS   multivitamin with minerals  1 tablet Oral Daily   nicotine   21 mg Transdermal Daily   pantoprazole   40 mg Oral Daily   phenobarbital  32.4 mg Oral  Q8H   rosuvastatin   5 mg Oral Daily   sodium chloride  flush  10-40 mL Intracatheter Q12H   sodium chloride   2 g Oral TID WC   thiamine   100 mg Oral Q24H   umeclidinium-vilanterol  1 puff Inhalation Daily   Continuous Infusions:  sodium chloride  125 mL/hr at 12/02/23 1100   thiamine  (VITAMIN B1) injection     Diet: Diet Order             Diet regular Room service appropriate? Yes; Fluid consistency: Thin; Fluid restriction: 1200 mL Fluid  Diet effective now

## 2023-12-03 DIAGNOSIS — K047 Periapical abscess without sinus: Secondary | ICD-10-CM | POA: Diagnosis not present

## 2023-12-03 DIAGNOSIS — E871 Hypo-osmolality and hyponatremia: Secondary | ICD-10-CM | POA: Diagnosis not present

## 2023-12-03 LAB — CBC
HCT: 27.2 % — ABNORMAL LOW (ref 39.0–52.0)
Hemoglobin: 9.1 g/dL — ABNORMAL LOW (ref 13.0–17.0)
MCH: 31.7 pg (ref 26.0–34.0)
MCHC: 33.5 g/dL (ref 30.0–36.0)
MCV: 94.8 fL (ref 80.0–100.0)
Platelets: 323 K/uL (ref 150–400)
RBC: 2.87 MIL/uL — ABNORMAL LOW (ref 4.22–5.81)
RDW: 12.6 % (ref 11.5–15.5)
WBC: 7.7 K/uL (ref 4.0–10.5)
nRBC: 0 % (ref 0.0–0.2)

## 2023-12-03 LAB — GLUCOSE, CAPILLARY
Glucose-Capillary: 124 mg/dL — ABNORMAL HIGH (ref 70–99)
Glucose-Capillary: 126 mg/dL — ABNORMAL HIGH (ref 70–99)
Glucose-Capillary: 79 mg/dL (ref 70–99)
Glucose-Capillary: 80 mg/dL (ref 70–99)
Glucose-Capillary: 90 mg/dL (ref 70–99)
Glucose-Capillary: 95 mg/dL (ref 70–99)

## 2023-12-03 LAB — BASIC METABOLIC PANEL WITH GFR
Anion gap: 6 (ref 5–15)
Anion gap: 8 (ref 5–15)
Anion gap: 9 (ref 5–15)
BUN: 6 mg/dL (ref 6–20)
BUN: 8 mg/dL (ref 6–20)
BUN: 9 mg/dL (ref 6–20)
CO2: 22 mmol/L (ref 22–32)
CO2: 23 mmol/L (ref 22–32)
CO2: 25 mmol/L (ref 22–32)
Calcium: 7.6 mg/dL — ABNORMAL LOW (ref 8.9–10.3)
Calcium: 7.8 mg/dL — ABNORMAL LOW (ref 8.9–10.3)
Calcium: 7.9 mg/dL — ABNORMAL LOW (ref 8.9–10.3)
Chloride: 93 mmol/L — ABNORMAL LOW (ref 98–111)
Chloride: 94 mmol/L — ABNORMAL LOW (ref 98–111)
Chloride: 94 mmol/L — ABNORMAL LOW (ref 98–111)
Creatinine, Ser: 0.83 mg/dL (ref 0.61–1.24)
Creatinine, Ser: 0.9 mg/dL (ref 0.61–1.24)
Creatinine, Ser: 0.92 mg/dL (ref 0.61–1.24)
GFR, Estimated: >60 mL/min (ref >60)
GFR, Estimated: >60 mL/min (ref >60)
GFR, Estimated: >60 mL/min (ref >60)
Glucose, Bld: 153 mg/dL — ABNORMAL HIGH (ref 70–99)
Glucose, Bld: 88 mg/dL (ref 70–99)
Glucose, Bld: 91 mg/dL (ref 70–99)
Potassium: 4.2 mmol/L (ref 3.5–5.1)
Potassium: 4.6 mmol/L (ref 3.5–5.1)
Potassium: 4.8 mmol/L (ref 3.5–5.1)
Sodium: 124 mmol/L — ABNORMAL LOW (ref 135–145)
Sodium: 125 mmol/L — ABNORMAL LOW (ref 135–145)
Sodium: 125 mmol/L — ABNORMAL LOW (ref 135–145)

## 2023-12-03 LAB — FERRITIN: Ferritin: 73 ng/mL (ref 24–336)

## 2023-12-03 LAB — RETICULOCYTES
Immature Retic Fract: 3.6 % (ref 2.3–15.9)
RBC.: 2.75 MIL/uL — ABNORMAL LOW (ref 4.22–5.81)
Retic Count, Absolute: 39.3 K/uL (ref 19.0–186.0)
Retic Ct Pct: 1.4 % (ref 0.4–3.1)

## 2023-12-03 LAB — IRON AND TIBC
Iron: 41 ug/dL — ABNORMAL LOW (ref 45–182)
Saturation Ratios: 16 % — ABNORMAL LOW (ref 17.9–39.5)
TIBC: 251 ug/dL (ref 250–450)
UIBC: 210 ug/dL

## 2023-12-03 LAB — VITAMIN B12: Vitamin B-12: 593 pg/mL (ref 180–914)

## 2023-12-03 LAB — MAGNESIUM: Magnesium: 1.8 mg/dL (ref 1.7–2.4)

## 2023-12-03 LAB — FOLATE: Folate: 10 ng/mL (ref >5.9)

## 2023-12-03 MED ORDER — HYDROCODONE-ACETAMINOPHEN 5-325 MG PO TABS
1.0000 | ORAL_TABLET | ORAL | Status: DC | PRN
  Administered 2023-12-03 – 2023-12-06 (×8): 1 via ORAL
  Filled 2023-12-03 (×8): qty 1

## 2023-12-03 MED ORDER — SODIUM CHLORIDE 0.9 % IV SOLN
3.0000 g | Freq: Three times a day (TID) | INTRAVENOUS | Status: DC
  Administered 2023-12-03 – 2023-12-06 (×9): 3 g via INTRAVENOUS
  Filled 2023-12-03 (×9): qty 8

## 2023-12-03 MED ORDER — HYDROMORPHONE HCL 1 MG/ML IJ SOLN: 0.5000 mg | INTRAMUSCULAR | Status: DC | PRN

## 2023-12-03 MED ORDER — MAGNESIUM SULFATE 4 GM/100ML IV SOLN
4.0000 g | Freq: Once | INTRAVENOUS | Status: AC
  Administered 2023-12-03: 4 g via INTRAVENOUS
  Filled 2023-12-03: qty 100

## 2023-12-03 NOTE — Progress Notes (Signed)
 Physical Therapy Treatment Patient Details Name: Allen Wood MRN: [id number] DOB: 1970/11/20 Today's Date: 12/03/2023   History of Present Illness Patient is a 53 yr old male  who presented to [facility] ED 11/1 due to dizziness that was ongoing for a week. Upon initial lab work up, CT of head negative but patient noted to have severe hyponatremia. PMH: etoh abuse, seizures due to ETOH withdrawal in 2016-2017, HTN, HLD, anemia-IDA, COPD, tobacco abuse, anxiety, and depression    PT Comments  Tolerated therapy visit well. Ambulated up to 350 feet with CGA for safety, minor instability but self corrects with RW for support and minor cues for awareness/technique. Independent with bed mobility, CGA to stand from seated position, again with some increased sway, reliant on RW to initially steady, but gradually improves and can stand unsupported with CGA. Mild intentional tremor in bil hands. Oriented with increased time for recall and processing. Patient will continue to benefit from skilled physical therapy services to further improve independence with functional mobility. HR to 112 with exertion, denies dizziness or SOB.   If plan is discharge home, recommend the following: A little help with walking and/or transfers;A little help with bathing/dressing/bathroom;Assistance with cooking/housework;Help with stairs or ramp for entrance;Assist for transportation   Can travel by private vehicle        Equipment Recommendations  Rolling walker (2 wheels)    Recommendations for Other Services       Precautions / Restrictions Precautions Precautions: Fall Recall of Precautions/Restrictions: Intact Restrictions Weight Bearing Restrictions Per Provider Order: No     Mobility  Bed Mobility Overal bed mobility: Independent                  Transfers Overall transfer level: Needs assistance Equipment used: Rolling walker (2 wheels) Transfers: Sit to/from Stand Sit to Stand: Contact guard  assist           General transfer comment: CGA for safety, moderate sway but braces against bed and relies on RW to steady. Cues for safety and technique.    Ambulation/Gait Ambulation/Gait assistance: Contact guard assist Gait Distance (Feet): 350 Feet Assistive device: Rolling walker (2 wheels) Gait Pattern/deviations: Step-through pattern, Decreased stride length, Drifts right/left, Trunk flexed Gait velocity: dec Gait velocity interpretation: <1.8 ft/sec, indicate of risk for recurrent falls   General Gait Details: Intermittent drift, lifting RW to correct, CGA for safety with cues for proper AD use to maximize safety and support. Denies dizziness or SOB. VSS with HR to 112 while ambualting. No overt buckling noted. Cues for upright posture and forward gaze, proximity to AD.   Stairs             Wheelchair Mobility     Tilt Bed    Modified Rankin (Stroke Patients Only)       Balance Overall balance assessment: Needs assistance Sitting-balance support: No upper extremity supported, Feet supported Sitting balance-Leahy Scale: Fair     Standing balance support: During functional activity, No upper extremity supported Standing balance-Leahy Scale: Fair Standing balance comment: Able to stand within RW, use urinal without hands on RW, CGA for safety, increased sway but no overt LOB.                            Communication Communication Communication: No apparent difficulties  Cognition Arousal: Alert Behavior During Therapy: WFL for tasks assessed/performed   PT - Cognitive impairments: No family/caregiver present to determine baseline, Problem solving, Awareness,  Memory                       PT - Cognition Comments: Oriented but with delay in recall and processing. Following commands: Intact      Cueing Cueing Techniques: Verbal cues  Exercises      General Comments General comments (skin integrity, edema, etc.): Resting HR 90s,  up to 112 with exertion. BP 142/91      Pertinent Vitals/Pain Pain Assessment Pain Assessment: No/denies pain    Home Living                          Prior Function            PT Goals (current goals can now be found in the care plan section) Acute Rehab PT Goals Patient Stated Goal: to go home PT Goal Formulation: With patient Time For Goal Achievement: 12/15/23 Potential to Achieve Goals: Good Progress towards PT goals: Progressing toward goals    Frequency    Min 2X/week      PT Plan      Co-evaluation              AM-PAC PT 6 Clicks Mobility   Outcome Measure  Help needed turning from your back to your side while in a flat bed without using bedrails?: None Help needed moving from lying on your back to sitting on the side of a flat bed without using bedrails?: A Little Help needed moving to and from a bed to a chair (including a wheelchair)?: A Little Help needed standing up from a chair using your arms (e.g., wheelchair or bedside chair)?: A Little Help needed to walk in hospital room?: A Little Help needed climbing 3-5 steps with a railing? : A Little 6 Click Score: 19    End of Session Equipment Utilized During Treatment: Gait belt Activity Tolerance: Patient tolerated treatment well Patient left: with call bell/phone within reach;in chair;with chair alarm set Nurse Communication: Mobility status PT Visit Diagnosis: Muscle weakness (generalized) (M62.81);Unsteadiness on feet (R26.81);Other symptoms and signs involving the nervous system (R29.898)     Time: 8881-8863 PT Time Calculation (min) (ACUTE ONLY): 18 min  Charges:    $Gait Training: 8-22 mins PT General Charges $$ ACUTE PT VISIT: 1 Visit                     [name], PT, DPT [facility]  Rehabilitation Services Physical Therapist Office: [phone] Website: [email or web address]    [name] 12/03/2023, 12:22 PM

## 2023-12-03 NOTE — Plan of Care (Signed)

## 2023-12-03 NOTE — Progress Notes (Signed)
 PROGRESS NOTE    Allen Wood  FMW:[id number] DOB: 09/20/1970 DOA: 11/27/2023 PCP: Arloa Elsie SAUNDERS, MD   Brief Narrative:   Allen Wood is a 53 y.o. male with PMH of etoh abuse, seizures due to ETOH withdrawal in 2016-2017, HTN, HLD, anemia-IDA, COPD, tobacco abuse, anxiety, and depression who presented to [facility] ED due to by EMS due to dizziness that has been ongoing for a week,and had seen PCP earlier. He usually drinks bourbon on the weekends, at least  pint of bourbon daily on weekend days. Patient was found to have acute encephalopathy, fall in the setting of severe hyponatremia CT of head negative . Patient admitted to ICU on 11/28/2023> received 3% saline subsequently switched NS, hyponatremia stabilized, patient was also treated with phenobarbital taper for alcohol abuse with withdrawal 11/6> transferred to TRH service 11/7: Sodium still low at 125, left facial swelling, dental in origin, started on IV Unasyn  Assessment & Plan:   Principal Problem:   Hyponatremia      Assessment and plan:   Symptomatic hyponatremia Acute encephalopathy due to hyponatremia/alcohol abuse: CT head negative.  S/p 3% saline, now on normal saline and sodium improved to 127 as below. AO x 3. cont to monitor, avoid rapid correction On salt tab 124 today Free water  restriction  Left facial swelling, dental region: No abscess noted on gross oral cavity exam, multiple tooth loss noted, dental caries noted. Will start IV Unasyn.  Obtain imaging if no improvement Will need dental follow-up oupt  Hypomagnesemia 1.8 today   Normocytic anemia: Hemoglobin drifting, monitor.  Check anemia panel. Hb stable at 9.1  Alcohol abuse concern for withdrawals history of seizures in setting of withdrawals: On phenobarbital taper, CIWA and multivitamins high-dose thiamine , counseling for cessation.   Monitor for withdrawals, patient appears calm and comfortable today.   Likely COPD Tobacco  abuse: Continue Anoro needs outpatient PFTs.  Counseling for tobacco cessation.   Hypertension: Stable continue home losartan   Hyperlipidemia: On Crestor    GERD: Cont ppi.   Mobility: PT Orders: Active PT Follow up Rec: Home Health Pt11/05/2023 1400    DVT prophylaxis: heparin  injection 5,000 Units Start: 11/28/23 1400 SCDs Start: 11/28/23 0230 Code Status:   Code Status: Full Code Family Communication: No family at the bedside.   Patient status is: Remains hospitalized because of severity of illness Level of care: Progressive    Dispo: The patient is from: home            Anticipated disposition: TBD ~ 2 days    Consultants:   Procedures:   Antimicrobials:    Subjective:  Patient seen and examined at the bedside.  He is alert and oriented not in any acute distress.  Reports of left upper dental pain, also has some facial swelling.  No fever or chills.  Vital signs stable.  Sodium is still low at 125.  Objective: Vitals:   12/03/23 0515 12/03/23 0530 12/03/23 0545 12/03/23 0630  BP: 124/86 131/81 136/82 (!) 163/97  Pulse: 97 94 97 85  Resp:      Temp:      TempSrc:      SpO2: 92% 93% 92% 98%  Weight:      Height:        Intake/Output Summary (Last 24 hours) at 12/03/2023 0740 Last data filed at 12/03/2023 0400 Gross per 24 hour  Intake 1601.12 ml  Output 1300 ml  Net 301.12 ml   Filed Weights   11/30/23 0500 12/01/23 0500 12/02/23  0500  Weight: 78.6 kg 79.4 kg 78.8 kg    Examination:  General: Alert, oriented not in any acute distress Chest: Clear HEENT: Swelling of left anterior face noted, no erythema Oral cavity with poor dental hygiene, multiple teeth loss, dental caries noted.  No gingival abscess noted on gross exam CVS: S1, S2, no murmur, regular rhythm Abdomen: Soft, mild distention  Data Reviewed: I have personally reviewed following labs and imaging studies  CBC: Recent Labs  Lab 11/28/23 0858 11/30/23 0611 12/01/23 0438  12/02/23 0428 12/03/23 0400  WBC 2.9* 7.1 6.8 6.2 7.7  HGB 11.9* 10.4* 9.5* 8.4* 9.1*  HCT 31.0* 29.1* 27.5* 24.8* 27.2*  MCV 85.2 90.9 92.9 94.7 94.8  PLT 269 288 289 267 323   Basic Metabolic Panel: Recent Labs  Lab 11/28/23 0858 11/28/23 1420 11/29/23 0944 11/29/23 1318 11/30/23 0611 11/30/23 0745 12/01/23 0438 12/01/23 0803 12/02/23 0428 12/02/23 1031 12/02/23 1407 12/02/23 1711 12/02/23 2050 12/02/23 2355 12/03/23 0400  NA 111*   < > 115*   < > 121*   < > 122*   < > 127*   < > 125* 125* 124* 124* 125*  K 4.7  --  3.4*   < > 4.2   < > 4.2   < > 3.7   < > 4.5 4.5 4.3 4.2 4.6  CL 73*  --  77*   < > 85*   < > 88*   < > 97*   < > 95* 96* 94* 93* 94*  CO2 25  --  27   < > 28   < > 25   < > 23   < > 23 22 22 23 22   GLUCOSE 117*  --  125*   < > 89   < > 89   < > 74   < > 144* 120* 141* 153* 91  BUN 14  --  22*   < > 18   < > 14   < > 9   < > 9 9 8 9 6   CREATININE 1.08  --  1.42*   < > 1.02   < > 0.89   < > 0.70   < > 0.96 0.85 0.80 0.83 0.92  CALCIUM  8.2*  --  8.1*   < > 8.0*   < > 7.5*   < > 6.9*   < > 7.6* 7.7* 7.7* 7.6* 7.8*  MG 2.0  --  1.7  --  1.8  --  1.6*  --  1.3*  --   --   --   --   --  1.8  PHOS 5.3*  --  2.5  --  3.4  --  3.8  --  3.0  --   --   --   --   --   --    < > = values in this interval not displayed.   GFR: Estimated Creatinine Clearance: 96.6 mL/min (by C-G formula based on SCr of 0.92 mg/dL). Liver Function Tests: Recent Labs  Lab 11/28/23 0239  AST 70*  ALT 26  ALKPHOS 66  BILITOT 0.5  PROT 5.7*  ALBUMIN 2.9*   No results for input(s): LIPASE, AMYLASE in the last 168 hours. Recent Labs  Lab 11/28/23 0227 11/30/23 1000  AMMONIA 23 24   Coagulation Profile: No results for input(s): INR, PROTIME in the last 168 hours. Cardiac Enzymes: No results for input(s): CKTOTAL, CKMB, CKMBINDEX, TROPONINI in the last 168 hours. BNP (last  3 results) No results for input(s): PROBNP in the last 8760 hours. HbA1C: No results for  input(s): HGBA1C in the last 72 hours. CBG: Recent Labs  Lab 12/02/23 1616 12/02/23 2023 12/02/23 2347 12/03/23 0403 12/03/23 0731  GLUCAP 95 138* 168* 95 80   Lipid Profile: No results for input(s): CHOL, HDL, LDLCALC, TRIG, CHOLHDL, LDLDIRECT in the last 72 hours. Thyroid Function Tests: No results for input(s): TSH, T4TOTAL, FREET4, T3FREE, THYROIDAB in the last 72 hours. Anemia Panel: Recent Labs    12/03/23 0400  VITAMINB12 593  FOLATE 10.0  FERRITIN 73  TIBC 251  IRON 41*  RETICCTPCT 1.4   Sepsis Labs: No results for input(s): PROCALCITON, LATICACIDVEN in the last 168 hours.  Recent Results (from the past 240 hours)  Resp panel by RT-PCR (RSV, Flu A&B, Covid) Anterior Nasal Swab     Status: None   Collection Time: 11/27/23 11:18 PM   Specimen: Anterior Nasal Swab  Result Value Ref Range Status   SARS Coronavirus 2 by RT PCR NEGATIVE NEGATIVE Final   Influenza A by PCR NEGATIVE NEGATIVE Final   Influenza B by PCR NEGATIVE NEGATIVE Final    Comment: (NOTE) The Xpert Xpress SARS-CoV-2/FLU/RSV plus assay is intended as an aid in the diagnosis of influenza from Nasopharyngeal swab specimens and should not be used as a sole basis for treatment. Nasal washings and aspirates are unacceptable for Xpert Xpress SARS-CoV-2/FLU/RSV testing.  Fact Sheet for Patients: bloggercourse.com  Fact Sheet for Healthcare Providers: seriousbroker.it  This test is not yet approved or cleared by the [place]  FDA and has been authorized for detection and/or diagnosis of SARS-CoV-2 by FDA under an Emergency Use Authorization (EUA). This EUA will remain in effect (meaning this test can be used) for the duration of the COVID-19 declaration under Section 564(b)(1) of the Act, 21 U.S.C. section 360bbb-3(b)(1), unless the authorization is terminated or revoked.     Resp Syncytial Virus by PCR NEGATIVE  NEGATIVE Final    Comment: (NOTE) Fact Sheet for Patients: bloggercourse.com  Fact Sheet for Healthcare Providers: seriousbroker.it  This test is not yet approved or cleared by the [place]  FDA and has been authorized for detection and/or diagnosis of SARS-CoV-2 by FDA under an Emergency Use Authorization (EUA). This EUA will remain in effect (meaning this test can be used) for the duration of the COVID-19 declaration under Section 564(b)(1) of the Act, 21 U.S.C. section 360bbb-3(b)(1), unless the authorization is terminated or revoked.  Performed at [facility], [address], [place], [place] [address]   MRSA Next Gen by PCR, Nasal     Status: None   Collection Time: 11/28/23  2:30 AM   Specimen: Nasal Mucosa; Nasal Swab  Result Value Ref Range Status   MRSA by PCR Next Gen NOT DETECTED NOT DETECTED Final    Comment: (NOTE) The GeneXpert MRSA Assay (FDA approved for NASAL specimens only), is one component of a comprehensive MRSA colonization surveillance program. It is not intended to diagnose MRSA infection nor to guide or monitor treatment for MRSA infections. Test performance is not FDA approved in patients less than 16 years old. Performed at [facility], [address], [place], [place] [address]          Radiology Studies: No results found.      Scheduled Meds:  Chlorhexidine  Gluconate Cloth  6 each Topical Daily   ferrous sulfate   325 mg Oral Q breakfast   folic acid   1 mg  Oral Daily   heparin   5,000 Units Subcutaneous Q8H   insulin aspart  0-15 Units Subcutaneous Q4H   losartan  25 mg Oral Daily   montelukast  10 mg Oral QHS   multivitamin with minerals  1 tablet Oral Daily   nicotine   21 mg Transdermal Daily   pantoprazole   40 mg Oral Daily   phenobarbital  32.4 mg Oral Q8H   rosuvastatin   5 mg Oral Daily   sodium chloride  flush  10-40 mL Intracatheter Q12H   sodium chloride   2  g Oral TID WC   thiamine   100 mg Oral Q24H   umeclidinium-vilanterol  1 puff Inhalation Daily   Continuous Infusions:  thiamine  (VITAMIN B1) injection            Webber Michiels, MD [facility] 12/03/2023, 7:40 AM

## 2023-12-03 NOTE — Plan of Care (Signed)
  Problem: Education: Goal: Knowledge of General Education information will improve Description: Including pain rating scale, medication(s)/side effects and non-pharmacologic comfort measures Outcome: Progressing   Problem: Clinical Measurements: Goal: Ability to maintain clinical measurements within normal limits will improve Outcome: Progressing Goal: Diagnostic test results will improve Outcome: Progressing Goal: Respiratory complications will improve Outcome: Progressing Goal: Cardiovascular complication will be avoided Outcome: Progressing   Problem: Activity: Goal: Risk for activity intolerance will decrease Outcome: Progressing   

## 2023-12-04 DIAGNOSIS — E871 Hypo-osmolality and hyponatremia: Secondary | ICD-10-CM | POA: Diagnosis not present

## 2023-12-04 DIAGNOSIS — K047 Periapical abscess without sinus: Secondary | ICD-10-CM | POA: Diagnosis not present

## 2023-12-04 LAB — CBC WITH DIFFERENTIAL/PLATELET
Abs Immature Granulocytes: 0.03 K/uL (ref 0.00–0.07)
Basophils Absolute: 0 K/uL (ref 0.0–0.1)
Basophils Relative: 1 %
Eosinophils Absolute: 0.1 K/uL (ref 0.0–0.5)
Eosinophils Relative: 2 %
HCT: 26 % — ABNORMAL LOW (ref 39.0–52.0)
Hemoglobin: 8.9 g/dL — ABNORMAL LOW (ref 13.0–17.0)
Immature Granulocytes: 1 %
Lymphocytes Relative: 15 %
Lymphs Abs: 0.9 K/uL (ref 0.7–4.0)
MCH: 32 pg (ref 26.0–34.0)
MCHC: 34.2 g/dL (ref 30.0–36.0)
MCV: 93.5 fL (ref 80.0–100.0)
Monocytes Absolute: 0.9 K/uL (ref 0.1–1.0)
Monocytes Relative: 14 %
Neutro Abs: 4.1 K/uL (ref 1.7–7.7)
Neutrophils Relative %: 67 %
Platelets: 328 K/uL (ref 150–400)
RBC: 2.78 MIL/uL — ABNORMAL LOW (ref 4.22–5.81)
RDW: 12.7 % (ref 11.5–15.5)
WBC: 5.9 K/uL (ref 4.0–10.5)
nRBC: 0 % (ref 0.0–0.2)

## 2023-12-04 LAB — GLUCOSE, CAPILLARY
Glucose-Capillary: 121 mg/dL — ABNORMAL HIGH (ref 70–99)
Glucose-Capillary: 85 mg/dL (ref 70–99)
Glucose-Capillary: 85 mg/dL (ref 70–99)
Glucose-Capillary: 89 mg/dL (ref 70–99)
Glucose-Capillary: 97 mg/dL (ref 70–99)
Glucose-Capillary: 97 mg/dL (ref 70–99)

## 2023-12-04 LAB — COMPREHENSIVE METABOLIC PANEL WITH GFR
ALT: 13 U/L (ref 0–44)
AST: 15 U/L (ref 15–41)
Albumin: 2.3 g/dL — ABNORMAL LOW (ref 3.5–5.0)
Alkaline Phosphatase: 44 U/L (ref 38–126)
Anion gap: 11 (ref 5–15)
BUN: 9 mg/dL (ref 6–20)
CO2: 23 mmol/L (ref 22–32)
Calcium: 8.5 mg/dL — ABNORMAL LOW (ref 8.9–10.3)
Chloride: 95 mmol/L — ABNORMAL LOW (ref 98–111)
Creatinine, Ser: 0.83 mg/dL (ref 0.61–1.24)
GFR, Estimated: >60 mL/min (ref >60)
Glucose, Bld: 84 mg/dL (ref 70–99)
Potassium: 5 mmol/L (ref 3.5–5.1)
Sodium: 129 mmol/L — ABNORMAL LOW (ref 135–145)
Total Bilirubin: 0.4 mg/dL (ref 0.0–1.2)
Total Protein: 5.1 g/dL — ABNORMAL LOW (ref 6.5–8.1)

## 2023-12-04 LAB — MAGNESIUM: Magnesium: 1.7 mg/dL (ref 1.7–2.4)

## 2023-12-04 MED ORDER — GERHARDT'S BUTT CREAM
TOPICAL_CREAM | Freq: Two times a day (BID) | CUTANEOUS | Status: DC
  Filled 2023-12-04: qty 60

## 2023-12-04 MED ORDER — MAGNESIUM OXIDE -MG SUPPLEMENT 400 (240 MG) MG PO TABS
200.0000 mg | ORAL_TABLET | Freq: Every day | ORAL | Status: DC
  Administered 2023-12-04 – 2023-12-06 (×3): 200 mg via ORAL
  Filled 2023-12-04 (×3): qty 1

## 2023-12-04 NOTE — Progress Notes (Signed)
 Patient had run of svt(140).  RN went to assess patient, patient states he was looking at his bank account. Dr Mcarthur notified. No new orders received.

## 2023-12-04 NOTE — Plan of Care (Signed)
   Problem: Education: Goal: Knowledge of General Education information will improve Description Including pain rating scale, medication(s)/side effects and non-pharmacologic comfort measures Outcome: Progressing

## 2023-12-04 NOTE — Progress Notes (Signed)
 PROGRESS NOTE    Allen Wood  FMW:[id number] DOB: 1970/12/25 DOA: 11/27/2023 PCP: Arloa Elsie SAUNDERS, MD   Brief Narrative:   Allen Wood is a 53 y.o. male with PMH of etoh abuse, seizures due to ETOH withdrawal in 2016-2017, HTN, HLD, anemia-IDA, COPD, tobacco abuse, anxiety, and depression who presented to [facility] ED due to by EMS due to dizziness that has been ongoing for a week,and had seen PCP earlier. He usually drinks bourbon on the weekends, at least  pint of bourbon daily on weekend days. Patient was found to have acute encephalopathy, fall in the setting of severe hyponatremia CT of head negative . Patient admitted to ICU on 11/28/2023> received 3% saline subsequently switched NS, hyponatremia stabilized, patient was also treated with phenobarbital taper for alcohol abuse with withdrawal 11/6> transferred to TRH service 11/7: Sodium still low at 125, left facial swelling, dental in origin, started on IV Unasyn  Assessment & Plan:   Principal Problem:   Hyponatremia      Assessment and plan:   Symptomatic hyponatremia Acute encephalopathy due to hyponatremia/alcohol abuse: CT head negative.  S/p 3% saline, now on normal saline and sodium improved to 129  AO x 3. cont to monitor, avoid rapid correction On salt tab Free water  restriction  Left facial swelling, dental origin:  No abscess noted on gross oral cavity exam, multiple tooth loss noted, dental caries noted. IV Unasyn started 11/7 with  improvement noted.  Will continue IV Dilaudid /p.o. Norco as needed. White count is normal, vital signs are stable, patient is afebrile Consider imaging if no improvement. Will need dental follow-up oupt  Hypomagnesemia Replace as needed.  Normocytic anemia: Hemoglobin drifting, monitor.  Check anemia panel. Hb stable at 9.1  Alcohol abuse concern for withdrawals history of seizures in setting of withdrawals: On phenobarbital taper, CIWA and multivitamins high-dose  thiamine , counseling for cessation.   Monitor for withdrawals, patient appears calm and comfortable today.   Likely COPD Tobacco abuse: Continue Anoro needs outpatient PFTs.  Counseling for tobacco cessation.   Hypertension: Stable continue home losartan   Hyperlipidemia: On Crestor    GERD: Cont ppi.   Mobility: PT Orders: Active PT Follow up Rec: Home Health Pt11/05/2023 1400    DVT prophylaxis: heparin  injection 5,000 Units Start: 11/28/23 1400 SCDs Start: 11/28/23 0230 Code Status:   Code Status: Full Code Family Communication: Discussed with wife Patient status is: Remains hospitalized because of severity of illness Level of care: Progressive    Dispo: The patient is from: home            Anticipated disposition: TBD ~ 2 days   Subjective:  Patient seen and examined at the bedside.  He is alert and oriented not in any acute distress.  Left facial swelling has improved although not back to baseline.  Patient also reports improvement in pain but he has been utilizing pain medications.  Vital signs are stable.  Objective: Vitals:   12/04/23 0000 12/04/23 0111 12/04/23 0734 12/04/23 0902  BP:  (!) 169/102 (!) 159/96   Pulse:  88    Resp:  18 20   Temp: 98.7 F (37.1 C) 98.6 F (37 C) 99.7 F (37.6 C)   TempSrc: Oral Oral Oral   SpO2:  96%  96%  Weight:  81.5 kg    Height:        Intake/Output Summary (Last 24 hours) at 12/04/2023 1122 Last data filed at 12/04/2023 0839 Gross per 24 hour  Intake 1424.21 ml  Output 1352 ml  Net 72.21 ml   Filed Weights   12/01/23 0500 12/02/23 0500 12/04/23 0111  Weight: 79.4 kg 78.8 kg 81.5 kg    Examination:  General: Alert, oriented not in any acute distress Chest: Clear HEENT: Swelling of left anterior face noted, no erythema Oral cavity with poor dental hygiene, multiple teeth loss, dental caries noted.  No gingival abscess noted on gross exam CVS: S1, S2, no murmur, regular rhythm Abdomen: Soft, mild  distention  Data Reviewed: I have personally reviewed following labs and imaging studies  CBC: Recent Labs  Lab 11/30/23 0611 12/01/23 0438 12/02/23 0428 12/03/23 0400 12/04/23 0729  WBC 7.1 6.8 6.2 7.7 5.9  NEUTROABS  --   --   --   --  4.1  HGB 10.4* 9.5* 8.4* 9.1* 8.9*  HCT 29.1* 27.5* 24.8* 27.2* 26.0*  MCV 90.9 92.9 94.7 94.8 93.5  PLT 288 289 267 323 328   Basic Metabolic Panel: Recent Labs  Lab 11/28/23 0858 11/28/23 1420 11/29/23 0944 11/29/23 1318 11/30/23 0611 11/30/23 0745 12/01/23 0438 12/01/23 0803 12/02/23 0428 12/02/23 1031 12/02/23 2050 12/02/23 2355 12/03/23 0400 12/03/23 0839 12/04/23 0729  NA 111*   < > 115*   < > 121*   < > 122*   < > 127*   < > 124* 124* 125* 125* 129*  K 4.7  --  3.4*   < > 4.2   < > 4.2   < > 3.7   < > 4.3 4.2 4.6 4.8 5.0  CL 73*  --  77*   < > 85*   < > 88*   < > 97*   < > 94* 93* 94* 94* 95*  CO2 25  --  27   < > 28   < > 25   < > 23   < > 22 23 22 25 23   GLUCOSE 117*  --  125*   < > 89   < > 89   < > 74   < > 141* 153* 91 88 84  BUN 14  --  22*   < > 18   < > 14   < > 9   < > 8 9 6 8 9   CREATININE 1.08  --  1.42*   < > 1.02   < > 0.89   < > 0.70   < > 0.80 0.83 0.92 0.90 0.83  CALCIUM  8.2*  --  8.1*   < > 8.0*   < > 7.5*   < > 6.9*   < > 7.7* 7.6* 7.8* 7.9* 8.5*  MG 2.0  --  1.7  --  1.8  --  1.6*  --  1.3*  --   --   --  1.8  --  1.7  PHOS 5.3*  --  2.5  --  3.4  --  3.8  --  3.0  --   --   --   --   --   --    < > = values in this interval not displayed.   GFR: Estimated Creatinine Clearance: 107.1 mL/min (by C-G formula based on SCr of 0.83 mg/dL). Liver Function Tests: Recent Labs  Lab 11/28/23 0239 12/04/23 0729  AST 70* 15  ALT 26 13  ALKPHOS 66 44  BILITOT 0.5 0.4  PROT 5.7* 5.1*  ALBUMIN 2.9* 2.3*   No results for input(s): LIPASE, AMYLASE in the last 168 hours. Recent Labs  Lab 11/28/23  9772 11/30/23 1000  AMMONIA 23 24   Coagulation Profile: No results for input(s): INR, PROTIME in the  last 168 hours. Cardiac Enzymes: No results for input(s): CKTOTAL, CKMB, CKMBINDEX, TROPONINI in the last 168 hours. BNP (last 3 results) No results for input(s): PROBNP in the last 8760 hours. HbA1C: No results for input(s): HGBA1C in the last 72 hours. CBG: Recent Labs  Lab 12/03/23 1525 12/03/23 1952 12/03/23 2353 12/04/23 0508 12/04/23 0733  GLUCAP 126* 124* 79 97 85   Lipid Profile: No results for input(s): CHOL, HDL, LDLCALC, TRIG, CHOLHDL, LDLDIRECT in the last 72 hours. Thyroid Function Tests: No results for input(s): TSH, T4TOTAL, FREET4, T3FREE, THYROIDAB in the last 72 hours. Anemia Panel: Recent Labs    12/03/23 0400  VITAMINB12 593  FOLATE 10.0  FERRITIN 73  TIBC 251  IRON 41*  RETICCTPCT 1.4   Sepsis Labs: No results for input(s): PROCALCITON, LATICACIDVEN in the last 168 hours.  Recent Results (from the past 240 hours)  Resp panel by RT-PCR (RSV, Flu A&B, Covid) Anterior Nasal Swab     Status: None   Collection Time: 11/27/23 11:18 PM   Specimen: Anterior Nasal Swab  Result Value Ref Range Status   SARS Coronavirus 2 by RT PCR NEGATIVE NEGATIVE Final   Influenza A by PCR NEGATIVE NEGATIVE Final   Influenza B by PCR NEGATIVE NEGATIVE Final    Comment: (NOTE) The Xpert Xpress SARS-CoV-2/FLU/RSV plus assay is intended as an aid in the diagnosis of influenza from Nasopharyngeal swab specimens and should not be used as a sole basis for treatment. Nasal washings and aspirates are unacceptable for Xpert Xpress SARS-CoV-2/FLU/RSV testing.  Fact Sheet for Patients: bloggercourse.com  Fact Sheet for Healthcare Providers: seriousbroker.it  This test is not yet approved or cleared by the [place]  FDA and has been authorized for detection and/or diagnosis of SARS-CoV-2 by FDA under an Emergency Use Authorization (EUA). This EUA will remain in effect (meaning this  test can be used) for the duration of the COVID-19 declaration under Section 564(b)(1) of the Act, 21 U.S.C. section 360bbb-3(b)(1), unless the authorization is terminated or revoked.     Resp Syncytial Virus by PCR NEGATIVE NEGATIVE Final    Comment: (NOTE) Fact Sheet for Patients: bloggercourse.com  Fact Sheet for Healthcare Providers: seriousbroker.it  This test is not yet approved or cleared by the [place]  FDA and has been authorized for detection and/or diagnosis of SARS-CoV-2 by FDA under an Emergency Use Authorization (EUA). This EUA will remain in effect (meaning this test can be used) for the duration of the COVID-19 declaration under Section 564(b)(1) of the Act, 21 U.S.C. section 360bbb-3(b)(1), unless the authorization is terminated or revoked.  Performed at [facility], [address], [place], [place] [address]   MRSA Next Gen by PCR, Nasal     Status: None   Collection Time: 11/28/23  2:30 AM   Specimen: Nasal Mucosa; Nasal Swab  Result Value Ref Range Status   MRSA by PCR Next Gen NOT DETECTED NOT DETECTED Final    Comment: (NOTE) The GeneXpert MRSA Assay (FDA approved for NASAL specimens only), is one component of a comprehensive MRSA colonization surveillance program. It is not intended to diagnose MRSA infection nor to guide or monitor treatment for MRSA infections. Test performance is not FDA approved in patients less than 71 years old. Performed at [facility], [address] [place], [place] [address]  Radiology Studies: No results found.      Scheduled Meds:  Chlorhexidine  Gluconate Cloth  6 each Topical Daily   ferrous sulfate   325 mg Oral Q breakfast   folic acid   1 mg Oral Daily   Gerhardt's butt cream   Topical BID   heparin   5,000 Units Subcutaneous Q8H   insulin aspart  0-15 Units Subcutaneous Q4H   losartan  25 mg Oral Daily   montelukast  10 mg Oral  QHS   multivitamin with minerals  1 tablet Oral Daily   nicotine   21 mg Transdermal Daily   pantoprazole   40 mg Oral Daily   rosuvastatin   5 mg Oral Daily   sodium chloride  flush  10-40 mL Intracatheter Q12H   sodium chloride   2 g Oral TID WC   thiamine   100 mg Oral Q24H   umeclidinium-vilanterol  1 puff Inhalation Daily   Continuous Infusions:  ampicillin-sulbactam (UNASYN) IV 3 g (12/04/23 0715)   thiamine  (VITAMIN B1) injection            Derryl Duval, MD [facility] 12/04/2023, 11:22 AM

## 2023-12-05 DIAGNOSIS — E871 Hypo-osmolality and hyponatremia: Secondary | ICD-10-CM | POA: Diagnosis not present

## 2023-12-05 LAB — BASIC METABOLIC PANEL WITH GFR
Anion gap: 11 (ref 5–15)
BUN: 8 mg/dL (ref 6–20)
CO2: 20 mmol/L — ABNORMAL LOW (ref 22–32)
Calcium: 7 mg/dL — ABNORMAL LOW (ref 8.9–10.3)
Chloride: 102 mmol/L (ref 98–111)
Creatinine, Ser: 0.99 mg/dL (ref 0.61–1.24)
GFR, Estimated: >60 mL/min (ref >60)
Glucose, Bld: 67 mg/dL — ABNORMAL LOW (ref 70–99)
Potassium: 4.1 mmol/L (ref 3.5–5.1)
Sodium: 133 mmol/L — ABNORMAL LOW (ref 135–145)

## 2023-12-05 LAB — GLUCOSE, CAPILLARY
Glucose-Capillary: 101 mg/dL — ABNORMAL HIGH (ref 70–99)
Glucose-Capillary: 112 mg/dL — ABNORMAL HIGH (ref 70–99)
Glucose-Capillary: 120 mg/dL — ABNORMAL HIGH (ref 70–99)
Glucose-Capillary: 78 mg/dL (ref 70–99)
Glucose-Capillary: 89 mg/dL (ref 70–99)
Glucose-Capillary: 96 mg/dL (ref 70–99)
Glucose-Capillary: 96 mg/dL (ref 70–99)

## 2023-12-05 LAB — MAGNESIUM: Magnesium: 1 mg/dL — ABNORMAL LOW (ref 1.7–2.4)

## 2023-12-05 MED ORDER — LOSARTAN POTASSIUM 50 MG PO TABS
100.0000 mg | ORAL_TABLET | Freq: Every day | ORAL | Status: DC
  Administered 2023-12-06: 100 mg via ORAL
  Filled 2023-12-05: qty 2

## 2023-12-05 MED ORDER — MAGNESIUM SULFATE 4 GM/100ML IV SOLN
4.0000 g | Freq: Once | INTRAVENOUS | Status: AC
  Administered 2023-12-05: 4 g via INTRAVENOUS
  Filled 2023-12-05: qty 100

## 2023-12-05 NOTE — Plan of Care (Signed)
   Problem: Education: Goal: Knowledge of General Education information will improve Description Including pain rating scale, medication(s)/side effects and non-pharmacologic comfort measures Outcome: Progressing

## 2023-12-05 NOTE — Progress Notes (Signed)
 PROGRESS NOTE    Allen Wood  FMW:[id number] DOB: 1970/08/06 DOA: 11/27/2023 PCP: Arloa Elsie SAUNDERS, MD   Brief Narrative:  As per prior documentation: Allen Wood is a 53 y.o. 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 He usually drinks bourbon on the weekends, at least  pint of bourbon daily on weekend days. Patient was found to have acute encephalopathy, fall in the setting of severe hyponatremia CT of head negative . Patient admitted to ICU on 11/28/2023> received 3% saline subsequently switched NS, hyponatremia stabilized, patient was also treated with phenobarbital taper for alcohol abuse with withdrawal 11/6> transferred to TRH service 11/7: Sodium still low at 125, left facial swelling, dental in origin, started on IV Unasyn and p.o.  12/05/2023: Patient seen alongside patient's nurse.  Sodium has improved to 132.  Patient remains on IV Unasyn.  Likely, patient will discharge back home tomorrow.  Patient will need to follow-up with a dentist ASAP.   Assessment & Plan:   Principal Problem:   Hyponatremia      Assessment and plan:   Symptomatic hyponatremia Acute encephalopathy due to hyponatremia/alcohol abuse: CT head negative.  S/p 3% saline, now on normal saline and sodium improved to 129  AO x 3. cont to monitor, avoid rapid correction On salt tab Free water  restriction 12/05/2023: Sodium of 133 today.  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  Continues to drink beer.  Counseled patient to quit alcohol.  Continue salt tablets (sodium chloride  2 g p.o. 3 times daily).  Left facial swelling, dental origin:  No abscess noted on gross oral cavity exam, multiple tooth loss noted, dental caries noted. IV Unasyn  started 11/7 with  improvement noted.  Will continue IV Dilaudid /p.o. Norco as needed. White count is normal, vital signs are stable, patient is afebrile Consider imaging if no improvement. Will need dental follow-up oupt 12/05/2023: Resolved significantly.  No tenderness.  Continue IV Unasyn.  Likely discharge back home tomorrow.  Hypomagnesemia Replace as needed. 12/05/2023: Magnesium  of 1.  IV magnesium  4 g x 1.  Continue to monitor.  Normocytic anemia: Hemoglobin drifting, monitor.  Check anemia panel. Hb stable at 9.1 12/05/2023: Last hemoglobin of 8.9 g/dL.  Alcohol abuse concern for withdrawals history of seizures in setting of withdrawals: On phenobarbital taper, CIWA and multivitamins high-dose thiamine , counseling for cessation.   Monitor for withdrawals, patient appears calm and comfortable today. 12/05/2023: Counseled to quit alcohol use.   Likely COPD Tobacco abuse: Continue Anoro needs outpatient PFTs.  Counseling for tobacco cessation.   Hypertension: Stable continue home losartan 12/05/2023: Continue to monitor closely.  Resume losartan.   Hyperlipidemia: On Crestor    GERD: Cont ppi.   Mobility: PT Orders: Active PT Follow up Rec: Home Health Pt11/05/2023 1400    DVT prophylaxis: heparin  injection 5,000 Units Start: 11/28/23 1400 SCDs Start: 11/28/23 0230 Code Status:   Code Status: Full Code Family Communication: Discussed with wife Patient status is: Remains hospitalized because of severity of illness Level of care: Progressive    Dispo: The patient is from: home            Anticipated disposition: TBD ~ 2 days   Subjective: No new complaints.  Objective: Vitals:   12/04/23 1952 12/04/23 2353 12/05/23 9647  12/05/23 0736  BP: (!) 151/85 (!) 143/83 (!) 153/94 (!) 155/92  Pulse: 97 89 90 88  Resp: 16 20 18    Temp: 98.8 F (37.1 C) 99 F (37.2 C) 98.8 F (37.1 C) 98.4 F (36.9 C)  TempSrc: Oral Oral Oral Oral  SpO2: 98% 95% 98%   Weight:   80.2  kg   Height:        Intake/Output Summary (Last 24 hours) at 12/05/2023 1016 Last data filed at 12/05/2023 0913 Gross per 24 hour  Intake 1107 ml  Output 1300 ml  Net -193 ml   Filed Weights   12/02/23 0500 12/04/23 0111 12/05/23 0352  Weight: 78.8 kg 81.5 kg 80.2 kg    Examination:  General: Patient is awake and alert.  Not in any distress. Chest: Clear to auscultation. HEENT: Swollen left side of face has resolved.  Patient is pale. CVS: S1-S2. Abdomen: Soft and nontender.  Data Reviewed: I have personally reviewed following labs and imaging studies  CBC: Recent Labs  Lab 11/30/23 0611 12/01/23 0438 12/02/23 0428 12/03/23 0400 12/04/23 0729  WBC 7.1 6.8 6.2 7.7 5.9  NEUTROABS  --   --   --   --  4.1  HGB 10.4* 9.5* 8.4* 9.1* 8.9*  HCT 29.1* 27.5* 24.8* 27.2* 26.0*  MCV 90.9 92.9 94.7 94.8 93.5  PLT 288 289 267 323 328   Basic Metabolic Panel: Recent Labs  Lab 11/29/23 0944 11/29/23 1318 11/30/23 0611 11/30/23 0745 12/01/23 0438 12/01/23 0803 12/02/23 0428 12/02/23 1031 12/02/23 2355 12/03/23 0400 12/03/23 0839 12/04/23 0729 12/05/23 0500  NA 115*   < > 121*   < > 122*   < > 127*   < > 124* 125* 125* 129* 133*  K 3.4*   < > 4.2   < > 4.2   < > 3.7   < > 4.2 4.6 4.8 5.0 4.1  CL 77*   < > 85*   < > 88*   < > 97*   < > 93* 94* 94* 95* 102  CO2 27   < > 28   < > 25   < > 23   < > 23 22 25 23  20*  GLUCOSE 125*   < > 89   < > 89   < > 74   < > 153* 91 88 84 67*  BUN 22*   < > 18   < > 14   < > 9   < > 9 6 8 9 8   CREATININE 1.42*   < > 1.02   < > 0.89   < > 0.70   < > 0.83 0.92 0.90 0.83 0.99  CALCIUM  8.1*   < > 8.0*   < > 7.5*   < > 6.9*   < > 7.6* 7.8* 7.9* 8.5* 7.0*  MG 1.7  --  1.8  --  1.6*  --  1.3*  --   --  1.8  --  1.7 1.0*  PHOS 2.5  --  3.4  --  3.8  --  3.0  --   --   --   --   --   --    < > = values in this interval not displayed.   GFR: Estimated Creatinine Clearance: 89.8 mL/min (by C-G formula based on SCr of 0.99 mg/dL). Liver Function  Tests: Recent Labs  Lab 12/04/23 0729  AST 15  ALT 13  ALKPHOS 44  BILITOT 0.4  PROT  5.1*  ALBUMIN 2.3*   No results for input(s): LIPASE, AMYLASE in the last 168 hours. Recent Labs  Lab 11/30/23 1000  AMMONIA 24   Coagulation Profile: No results for input(s): INR, PROTIME in the last 168 hours. Cardiac Enzymes: No results for input(s): CKTOTAL, CKMB, CKMBINDEX, TROPONINI in the last 168 hours. BNP (last 3 results) No results for input(s): PROBNP in the last 8760 hours. HbA1C: No results for input(s): HGBA1C in the last 72 hours. CBG: Recent Labs  Lab 12/04/23 1546 12/04/23 2056 12/04/23 2351 12/05/23 0448 12/05/23 0734  GLUCAP 85 121* 89 78 89   Lipid Profile: No results for input(s): CHOL, HDL, LDLCALC, TRIG, CHOLHDL, LDLDIRECT in the last 72 hours. Thyroid Function Tests: No results for input(s): TSH, T4TOTAL, FREET4, T3FREE, THYROIDAB in the last 72 hours. Anemia Panel: Recent Labs    12/03/23 0400  VITAMINB12 593  FOLATE 10.0  FERRITIN 73  TIBC 251  IRON 41*  RETICCTPCT 1.4   Sepsis Labs: No results for input(s): PROCALCITON, LATICACIDVEN in the last 168 hours.  Recent Results (from the past 240 hours)  Resp panel by RT-PCR (RSV, Flu A&B, Covid) Anterior Nasal Swab     Status: None   Collection Time: 11/27/23 11:18 PM   Specimen: Anterior Nasal Swab  Result Value Ref Range Status   SARS Coronavirus 2 by RT PCR NEGATIVE NEGATIVE Final   Influenza A by PCR NEGATIVE NEGATIVE Final   Influenza B by PCR NEGATIVE NEGATIVE Final    Comment: (NOTE) The Xpert Xpress SARS-CoV-2/FLU/RSV plus assay is intended as an aid in the diagnosis of influenza from Nasopharyngeal swab specimens and should not be used as a sole basis for treatment. Nasal washings and aspirates are unacceptable for Xpert Xpress SARS-CoV-2/FLU/RSV testing.  Fact Sheet for Patients: bloggercourse.com  Fact Sheet for  Healthcare Providers: seriousbroker.it  This test is not yet approved or cleared by the [place]  FDA and has been authorized for detection and/or diagnosis of SARS-CoV-2 by FDA under an Emergency Use Authorization (EUA). This EUA will remain in effect (meaning this test can be used) for the duration of the COVID-19 declaration under Section 564(b)(1) of the Act, 21 U.S.C. section 360bbb-3(b)(1), unless the authorization is terminated or revoked.     Resp Syncytial Virus by PCR NEGATIVE NEGATIVE Final    Comment: (NOTE) Fact Sheet for Patients: bloggercourse.com  Fact Sheet for Healthcare Providers: seriousbroker.it  This test is not yet approved or cleared by the [place]  FDA and has been authorized for detection and/or diagnosis of SARS-CoV-2 by FDA under an Emergency Use Authorization (EUA). This EUA will remain in effect (meaning this test can be used) for the duration of the COVID-19 declaration under Section 564(b)(1) of the Act, 21 U.S.C. section 360bbb-3(b)(1), unless the authorization is terminated or revoked.  Performed at [facility], [address], [place], [place] [address]   MRSA Next Gen by PCR, Nasal     Status: None   Collection Time: 11/28/23  2:30 AM   Specimen: Nasal Mucosa; Nasal Swab  Result Value Ref Range Status   MRSA by PCR Next Gen NOT DETECTED NOT DETECTED Final    Comment: (NOTE) The GeneXpert MRSA Assay (FDA approved for NASAL specimens only), is one component of a comprehensive MRSA colonization surveillance program. It is not intended to diagnose MRSA infection nor to guide or monitor treatment for MRSA infections. Test performance is not FDA approved in patients less than 64 years old. Performed at [facility]  [facility], [address], [place], [place] [address]          Radiology Studies: No results found.      Scheduled Meds:   Chlorhexidine  Gluconate Cloth  6 each Topical Daily   ferrous sulfate   325 mg Oral Q breakfast   folic acid   1 mg Oral Daily   Gerhardt's butt cream   Topical BID   heparin   5,000 Units Subcutaneous Q8H   insulin aspart  0-15 Units Subcutaneous Q4H   losartan  25 mg Oral Daily   magnesium  oxide  200 mg Oral Daily   montelukast  10 mg Oral QHS   multivitamin with minerals  1 tablet Oral Daily   nicotine   21 mg Transdermal Daily   pantoprazole   40 mg Oral Daily   rosuvastatin   5 mg Oral Daily   sodium chloride  flush  10-40 mL Intracatheter Q12H   sodium chloride   2 g Oral TID WC   thiamine   100 mg Oral Q24H   umeclidinium-vilanterol  1 puff Inhalation Daily   Continuous Infusions:  ampicillin-sulbactam (UNASYN) IV 3 g (12/05/23 0632)   thiamine  (VITAMIN B1) injection     Time spent: 35 minutes.  Leatrice LILLETTE Chapel, MD [facility] 12/05/2023, 10:16 AM

## 2023-12-06 ENCOUNTER — Other Ambulatory Visit (HOSPITAL_COMMUNITY): Payer: Self-pay

## 2023-12-06 ENCOUNTER — Inpatient Hospital Stay (HOSPITAL_COMMUNITY)

## 2023-12-06 DIAGNOSIS — R42 Dizziness and giddiness: Secondary | ICD-10-CM

## 2023-12-06 DIAGNOSIS — R0602 Shortness of breath: Secondary | ICD-10-CM

## 2023-12-06 DIAGNOSIS — E871 Hypo-osmolality and hyponatremia: Secondary | ICD-10-CM | POA: Diagnosis not present

## 2023-12-06 LAB — RENAL FUNCTION PANEL
Albumin: 1.8 g/dL — ABNORMAL LOW (ref 3.5–5.0)
Anion gap: 13 (ref 5–15)
BUN: 10 mg/dL (ref 6–20)
CO2: 20 mmol/L — ABNORMAL LOW (ref 22–32)
Calcium: 7 mg/dL — ABNORMAL LOW (ref 8.9–10.3)
Chloride: 102 mmol/L (ref 98–111)
Creatinine, Ser: 1.11 mg/dL (ref 0.61–1.24)
GFR, Estimated: >60 mL/min (ref >60)
Glucose, Bld: 81 mg/dL (ref 70–99)
Phosphorus: 4.7 mg/dL — ABNORMAL HIGH (ref 2.5–4.6)
Potassium: 3.9 mmol/L (ref 3.5–5.1)
Sodium: 135 mmol/L (ref 135–145)

## 2023-12-06 LAB — GLUCOSE, CAPILLARY
Glucose-Capillary: 89 mg/dL (ref 70–99)
Glucose-Capillary: 103 mg/dL — ABNORMAL HIGH (ref 70–99)
Glucose-Capillary: 139 mg/dL — ABNORMAL HIGH (ref 70–99)
Glucose-Capillary: 89 mg/dL (ref 70–99)

## 2023-12-06 LAB — MAGNESIUM: Magnesium: 1.4 mg/dL — ABNORMAL LOW (ref 1.7–2.4)

## 2023-12-06 MED ORDER — ORAL CARE MOUTH RINSE
15.0000 mL | OROMUCOSAL | 0 refills | Status: AC | PRN
  Filled 2023-12-06: qty 473, fill #0

## 2023-12-06 MED ORDER — SODIUM CHLORIDE 1 G PO TABS
1.0000 g | ORAL_TABLET | Freq: Two times a day (BID) | ORAL | 0 refills | Status: AC
  Filled 2023-12-06: qty 60, 30d supply, fill #0

## 2023-12-06 MED ORDER — POLYETHYLENE GLYCOL 3350 17 G PO PACK: 17.0000 g | PACK | Freq: Every day | ORAL | 0 refills | Status: DC | PRN

## 2023-12-06 MED ORDER — ORAL CARE MOUTH RINSE: 15.0000 mL | OROMUCOSAL | 0 refills | Status: DC | PRN

## 2023-12-06 MED ORDER — POLYETHYLENE GLYCOL 3350 17 GM/SCOOP PO POWD
17.0000 g | Freq: Every day | ORAL | 0 refills | Status: AC | PRN
  Filled 2023-12-06: qty 238, 14d supply, fill #0

## 2023-12-06 MED ORDER — FOLIC ACID 1 MG PO TABS: 1.0000 mg | ORAL_TABLET | Freq: Every day | ORAL | 1 refills | Status: DC

## 2023-12-06 MED ORDER — DOCUSATE SODIUM 100 MG PO CAPS: 100.0000 mg | ORAL_CAPSULE | Freq: Two times a day (BID) | ORAL | 0 refills | Status: DC | PRN

## 2023-12-06 MED ORDER — MAGNESIUM SULFATE 4 GM/100ML IV SOLN
4.0000 g | Freq: Once | INTRAVENOUS | Status: AC
  Administered 2023-12-06: 4 g via INTRAVENOUS
  Filled 2023-12-06: qty 100

## 2023-12-06 MED ORDER — UMECLIDINIUM-VILANTEROL 62.5-25 MCG/ACT IN AEPB: 1.0000 | INHALATION_SPRAY | Freq: Every day | RESPIRATORY_TRACT | 1 refills | Status: DC

## 2023-12-06 MED ORDER — DOCUSATE SODIUM 100 MG PO CAPS
100.0000 mg | ORAL_CAPSULE | Freq: Two times a day (BID) | ORAL | 0 refills | Status: DC | PRN
  Filled 2023-12-06: qty 10, 5d supply, fill #0

## 2023-12-06 MED ORDER — VITAMIN B-1 100 MG PO TABS: 100.0000 mg | ORAL_TABLET | ORAL | 2 refills | Status: DC

## 2023-12-06 MED ORDER — THIAMINE HCL 100 MG PO TABS
100.0000 mg | ORAL_TABLET | ORAL | 2 refills | Status: AC
  Filled 2023-12-06: qty 30, 30d supply, fill #0

## 2023-12-06 MED ORDER — FOLIC ACID 1 MG PO TABS
1.0000 mg | ORAL_TABLET | Freq: Every day | ORAL | 1 refills | Status: AC
  Filled 2023-12-06: qty 30, 30d supply, fill #0

## 2023-12-06 MED ORDER — LOSARTAN POTASSIUM 100 MG PO TABS
100.0000 mg | ORAL_TABLET | Freq: Every day | ORAL | 1 refills | Status: DC
  Filled 2023-12-06: qty 30, 30d supply, fill #0

## 2023-12-06 MED ORDER — AMOXICILLIN-POT CLAVULANATE 875-125 MG PO TABS
1.0000 | ORAL_TABLET | Freq: Two times a day (BID) | ORAL | 0 refills | Status: AC
  Filled 2023-12-06: qty 10, 5d supply, fill #0

## 2023-12-06 MED ORDER — GERHARDT'S BUTT CREAM
1.0000 | TOPICAL_CREAM | Freq: Two times a day (BID) | CUTANEOUS | 0 refills | Status: AC
  Filled 2023-12-06: qty 60, 30d supply, fill #0

## 2023-12-06 MED ORDER — ADULT MULTIVITAMIN W/MINERALS CH: 1.0000 | ORAL_TABLET | Freq: Every day | ORAL | 1 refills | Status: DC

## 2023-12-06 MED ORDER — CARVEDILOL 3.125 MG PO TABS
3.1250 mg | ORAL_TABLET | Freq: Two times a day (BID) | ORAL | 1 refills | Status: DC
  Filled 2023-12-06: qty 60, 30d supply, fill #0

## 2023-12-06 MED ORDER — NICOTINE 21 MG/24HR TD PT24: 21.0000 mg | MEDICATED_PATCH | Freq: Every day | TRANSDERMAL | 0 refills | Status: DC

## 2023-12-06 MED ORDER — UMECLIDINIUM-VILANTEROL 62.5-25 MCG/ACT IN AEPB
1.0000 | INHALATION_SPRAY | Freq: Every day | RESPIRATORY_TRACT | 1 refills | Status: DC
  Filled 2023-12-06: qty 60, 30d supply, fill #0

## 2023-12-06 MED ORDER — ADULT MULTIVITAMIN W/MINERALS CH
1.0000 | ORAL_TABLET | Freq: Every day | ORAL | 1 refills | Status: AC
  Filled 2023-12-06: qty 30, 30d supply, fill #0

## 2023-12-06 MED ORDER — NICOTINE 21 MG/24HR TD PT24
21.0000 mg | MEDICATED_PATCH | Freq: Every day | TRANSDERMAL | 1 refills | Status: AC
  Filled 2023-12-06: qty 28, 28d supply, fill #0

## 2023-12-06 MED ORDER — HYDROCODONE-ACETAMINOPHEN 5-325 MG PO TABS: 1.0000 | ORAL_TABLET | ORAL | 0 refills | Status: DC | PRN

## 2023-12-06 MED ORDER — AMOXICILLIN-POT CLAVULANATE 875-125 MG PO TABS: 1.0000 | ORAL_TABLET | Freq: Two times a day (BID) | ORAL | 0 refills | Status: DC

## 2023-12-06 MED ORDER — SODIUM CHLORIDE 1 G PO TABS: 2.0000 g | ORAL_TABLET | Freq: Two times a day (BID) | ORAL | 0 refills | Status: DC

## 2023-12-06 MED ORDER — HYDROCODONE-ACETAMINOPHEN 5-325 MG PO TABS
1.0000 | ORAL_TABLET | ORAL | 0 refills | Status: DC | PRN
  Filled 2023-12-06: qty 30, 5d supply, fill #0

## 2023-12-06 MED ORDER — GERHARDT'S BUTT CREAM: 1.0000 | TOPICAL_CREAM | Freq: Two times a day (BID) | CUTANEOUS | 0 refills | Status: DC

## 2023-12-06 MED ORDER — ROSUVASTATIN CALCIUM 5 MG PO TABS
5.0000 mg | ORAL_TABLET | Freq: Every day | ORAL | 1 refills | Status: DC
  Filled 2023-12-06: qty 30, 30d supply, fill #0

## 2023-12-06 NOTE — Plan of Care (Signed)
  Problem: Education: Goal: Knowledge of General Education information will improve Description: Including pain rating scale, medication(s)/side effects and non-pharmacologic comfort measures Outcome: Adequate for Discharge   Problem: Health Behavior/Discharge Planning: Goal: Ability to manage health-related needs will improve Outcome: Adequate for Discharge   Problem: Clinical Measurements: Goal: Ability to maintain clinical measurements within normal limits will improve Outcome: Adequate for Discharge Goal: Will remain free from infection Outcome: Adequate for Discharge Goal: Diagnostic test results will improve Outcome: Adequate for Discharge Goal: Respiratory complications will improve Outcome: Adequate for Discharge Goal: Cardiovascular complication will be avoided Outcome: Adequate for Discharge   Problem: Activity: Goal: Risk for activity intolerance will decrease Outcome: Adequate for Discharge   Problem: Nutrition: Goal: Adequate nutrition will be maintained Outcome: Adequate for Discharge   Problem: Coping: Goal: Level of anxiety will decrease Outcome: Adequate for Discharge   Problem: Elimination: Goal: Will not experience complications related to bowel motility Outcome: Adequate for Discharge Goal: Will not experience complications related to urinary retention Outcome: Adequate for Discharge   Problem: Pain Managment: Goal: General experience of comfort will improve and/or be controlled Outcome: Adequate for Discharge   Problem: Safety: Goal: Ability to remain free from injury will improve Outcome: Adequate for Discharge   Problem: Skin Integrity: Goal: Risk for impaired skin integrity will decrease Outcome: Adequate for Discharge   Problem: Education: Goal: Ability to describe self-care measures that may prevent or decrease complications (Diabetes Survival Skills Education) will improve Outcome: Adequate for Discharge Goal: Individualized Educational  Video(s) Outcome: Adequate for Discharge   Problem: Coping: Goal: Ability to adjust to condition or change in health will improve Outcome: Adequate for Discharge   Problem: Fluid Volume: Goal: Ability to maintain a balanced intake and output will improve Outcome: Adequate for Discharge   Problem: Metabolic: Goal: Ability to maintain appropriate glucose levels will improve Outcome: Adequate for Discharge   Problem: Nutritional: Goal: Maintenance of adequate nutrition will improve Outcome: Adequate for Discharge Goal: Progress toward achieving an optimal weight will improve Outcome: Adequate for Discharge

## 2023-12-06 NOTE — Progress Notes (Signed)
 Physical Therapy Treatment Patient Details Name: Allen Wood MRN: [id number] DOB: [birth year] Today's Date: 12/06/2023   History of Present Illness Patient is a 53 yr old male  who presented to [facility] ED 11/1 due to dizziness that was ongoing for a week. Upon initial lab work up, CT of head negative but patient noted to have severe hyponatremia. PMH: etoh abuse, seizures due to ETOH withdrawal in 2016-2017, HTN, HLD, anemia-IDA, COPD, tobacco abuse, anxiety, and depression    PT Comments  The pt was agreeable to session with focus on stair and balance training as well as finalizing DME and therapy recommendations prior to anticipated DC home. The pt was able to demo improved standing balance, and complete transfers without UE support, but benefits from BUE support with gait due to increased lateral sway and staggering steps when attempting without RW. The pt was able to complete 6 stairs with CGA, and both pt and family were educated on guarding for stair navigation. Recommendations updated to OPPT after d/c for pt to work on dynamic balance as pt is hopeful to wean from use of DME and reduce risk of continued falls.    If plan is discharge home, recommend the following: A little help with walking and/or transfers;A little help with bathing/dressing/bathroom;Assistance with cooking/housework;Help with stairs or ramp for entrance;Assist for transportation   Can travel by private vehicle        Equipment Recommendations  Rolling walker (2 wheels)    Recommendations for Other Services       Precautions / Restrictions Precautions Precautions: Fall Recall of Precautions/Restrictions: Intact Restrictions Weight Bearing Restrictions Per Provider Order: No     Mobility  Bed Mobility Overal bed mobility: Independent                  Transfers Overall transfer level: Needs assistance Equipment used: None Transfers: Sit to/from Stand Sit to Stand: Contact guard assist            General transfer comment: CGA for safety, mild sway but steadies himself on bed. CGA for standing marches without bracing on bed after a few seconds    Ambulation/Gait Ambulation/Gait assistance: Contact guard assist, Min assist Gait Distance (Feet): 200 Feet (+ 100 ft) Assistive device: Rolling walker (2 wheels), None Gait Pattern/deviations: Step-through pattern, Decreased stride length, Drifts right/left, Trunk flexed, Staggering left Gait velocity: dec Gait velocity interpretation: <1.8 ft/sec, indicate of risk for recurrent falls   General Gait Details: mild drift and generally CGA with use of RW, up to minA with increased lateral staggering steps and slowed speed without UE support. Cues for upright posture and forward gaze, proximity to AD.   Stairs Stairs: Yes Stairs assistance: Contact guard assist Stair Management: Two rails, Alternating pattern, Forwards Number of Stairs: 6 General stair comments: dependent on BUE support, HR to 130bpm after      Balance Overall balance assessment: Needs assistance Sitting-balance support: No upper extremity supported, Feet supported Sitting balance-Leahy Scale: Good Sitting balance - Comments: static sitting without assist   Standing balance support: During functional activity, No upper extremity supported Standing balance-Leahy Scale: Fair Standing balance comment: able to ambulate without RW and minA, increased lateral sway and staggering steps. CGA with RW, initially posterior lean on bed with stand                            Communication Communication Communication: No apparent difficulties  Cognition Arousal: Alert Behavior During Therapy:  WFL for tasks assessed/performed, Flat affect   PT - Cognitive impairments: No family/caregiver present to determine baseline, Problem solving, Awareness, Memory                       PT - Cognition Comments: Oriented but with delay in recall and  processing. 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 PT Goal Formulation: With patient Time For Goal Achievement: 12/15/23 Potential to Achieve Goals: Good Progress towards PT goals: Progressing toward goals    Frequency    Min 2X/week       AM-PAC PT 6 Clicks Mobility   Outcome Measure  Help needed turning from your back to your side while in a flat bed without using bedrails?: None Help needed moving from lying on your back to sitting on the side of a flat bed without using bedrails?: A Little Help needed moving to and from a bed to a chair (including a wheelchair)?: A Little Help needed standing up from a chair using your arms (e.g., wheelchair or bedside chair)?: A Little Help needed to walk in hospital room?: A Little Help needed climbing 3-5 steps with a railing? : A Little 6 Click Score: 19    End of Session Equipment Utilized During Treatment: Gait belt Activity Tolerance: Patient tolerated treatment well Patient left: with call bell/phone within reach;in bed;with family/visitor present (sitting EOB) Nurse Communication: Mobility status PT Visit Diagnosis: Muscle weakness (generalized) (M62.81);Unsteadiness on feet (R26.81);Other symptoms and signs involving the nervous system (R29.898)     Time: 8691-8667 PT Time Calculation (min) (ACUTE ONLY): 24 min  Charges:    $Gait Training: 8-22 mins $Therapeutic Exercise: 8-22 mins PT General Charges $$ ACUTE PT VISIT: 1 Visit                     Izetta Call, PT, DPT   Acute  Rehabilitation Department Office [phone] Secure Chat Communication Preferred    Izetta JULIANNA Call 12/06/2023, 1:41 PM

## 2023-12-06 NOTE — Progress Notes (Addendum)
 IV Team to bedside for PICC line removal for discharge. This RN to bedside, right arm noted to be swollen compared to left arm.  Pulses present.  [place] sent to RN and MD, making them aware. PICC line removed without issue.

## 2023-12-06 NOTE — TOC Initial Note (Signed)
 Transition of Care [facility]) - Initial/Assessment Note    Patient Details  Name: Allen Wood MRN: [id number] Date of Birth: Oct 31, 1970  [facility] CM/SW Contact:    Sudie Erminio Deems, RN Phone Number: 12/06/2023, 1:45 PM  Clinical Narrative: Patient presented for hyponatremia. PTA Patient was from home with spouse. Patient in need of DME rolling walker- choice received and referral submitted via the hub for DME. [employer] will deliver to the room. Patient usually does outpatient PT at [facility] and the patient wants Outpatient PT with them. MD to sign ambulatory referral and RN to print and provide to the patient. No further needs identified.             Expected Discharge Plan: OP Rehab Barriers to Discharge: No Barriers Identified   Patient Goals and CMS Choice Patient states their goals for this hospitalization and ongoing recovery are:: plan to return home with spouse.  Expected Discharge Plan and Services   Discharge Planning Services: CM Consult Post Acute Care Choice: NA Living arrangements for the past 2 months: Single Family Home Expected Discharge Date: 12/06/23               DME Arranged: Vannie rolling DME Agency: [employer] Date DME Agency Contacted: 12/06/23 Time DME Agency Contacted: 1340 Representative spoke with at DME Agency: [place] HH Arranged: NA  Prior Living Arrangements/Services Living arrangements for the past 2 months: Single Family Home Lives with:: Spouse Patient language and need for interpreter reviewed:: Yes Do you feel safe going back to the place where you live?: Yes      Need for Family Participation in Patient Care: Yes (Comment) Care giver support system in place?: Yes (comment)   Criminal Activity/Legal Involvement Pertinent to Current Situation/Hospitalization: No - Comment as needed  Activities of Daily Living   ADL Screening (condition at time of admission) Independently performs ADLs?: Yes  (appropriate for developmental age) Is the patient deaf or have difficulty hearing?: No Does the patient have difficulty seeing, even when wearing glasses/contacts?: No Does the patient have difficulty concentrating, remembering, or making decisions?: No  Permission Sought/Granted Permission sought to share information with : Family Supports, Case Manager   Emotional Assessment Appearance:: Appears stated age Attitude/Demeanor/Rapport: Engaged Affect (typically observed): Appropriate Orientation: : Oriented to Self, Oriented to Place, Oriented to  Time, Oriented to Situation Alcohol / Substance Use: Not Applicable Psych Involvement: No (comment)  Admission diagnosis:  Hyponatremia [E87.1] COPD with acute exacerbation (HCC) [J44.1] Patient Active Problem List   Diagnosis Date Noted   Hyponatremia 11/28/2023   Alcohol withdrawal seizure (HCC) 12/08/2015   Respiratory failure (HCC) 02/12/2014   HCAP (healthcare-associated pneumonia)    Severe sepsis (HCC)    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 [place] [address] Phone: [phone] Fax: [phone]     Social Drivers of  Health (SDOH) Social History: SDOH Screenings   Food Insecurity: No Food Insecurity (11/28/2023)  Housing: Low Risk  (11/28/2023)  Transportation Needs: No Transportation Needs (11/28/2023)  Utilities: Not  At Risk (11/28/2023)  Tobacco Use: High Risk (11/28/2023)   SDOH Interventions:     Readmission Risk Interventions     No data to display

## 2023-12-06 NOTE — Progress Notes (Addendum)
 Noted a small necrotic tissue on right great toe. Also noted a small black scab on his right inner ankle. Patient states those have been there for a long time. Has good pulses and good capitally refills. Dr. Rosario made aware.  [name], RN

## 2023-12-06 NOTE — Discharge Summary (Addendum)
 Physician Discharge Summary  Patient ID: Allen Wood MRN: [id number] DOB/AGE: May 24, 1970 53 y.o.  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  Patient drinks bourbon daily.  Patient presented with 1 week history of dizziness, acute encephalopathy and severe hyponatremia (sodium of 102).  CT head was negative.  Patient was initially admitted to ICU team.  Patient was initially managed with 3% saline.  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  Patient was also managed for alcohol withdrawal.  As the sodium level improved, patient was transferred to the hospitalist team.  Patient is on sodium chloride  tablet.  Sodium today is 135.  During the hospital stay, patient was noted to have left facial swelling.  There were concerns for dental caries.  Patient was managed with IV Unasyn.  Patient be discharged on oral Augmentin.  Patient will follow-up with primary care provider and dentist within 1 week of discharge.  Patient and patient's wife prefer to be referred to [facility] oral surgery ([address] [place], [place], [place] ).  Symptomatic hyponatremia: Acute encephalopathy due to hyponatremia/alcohol abuse: -Sodium on presentation was 102. - CT head negative. - Initially managed with 3% saline. 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 - Patient was managed with sodium salt 2 g p.o. 3 times daily. - Patient will be discharged on sodium chloride  tablet 2 g twice daily. - Sodium prior to discharge was 135. - Continue to monitor renal function and electrolytes. - Patient has been counseled to  quit alcohol use.    Left facial swelling, dental origin:  -No abscess noted on gross oral cavity exam, multiple tooth loss noted, dental caries noted. -IV Unasyn started 12/03/2023, with improvement noted.   - Patient be discharged back home on Augmentin. - Patient will follow-up with dentist within 1 week of discharge.   Hypomagnesemia Monitored and replaced.    Normocytic anemia: -Continue to monitor. - Further workup as per PCP   Alcohol abuse concern for withdrawals history of seizures in setting of withdrawals: -Patient was managed with phenobarbital taper, CIWA and multivitamins high-dose thiamine , -counseled to quit alcohol use.     Likely COPD Tobacco abuse: Continue Anoro needs outpatient PFTs.  Counseling for tobacco cessation.   Hypertension: Stable continue home losartan 12/05/2023: Continue to monitor closely.  Resume losartan.   Hyperlipidemia: On Crestor    GERD: Cont ppi.  Consults: None.  Patient was initially admitted to ICU team.  Significant Diagnostic Studies:  -Sodium of 102 on presentation.  Prior to discharge, sodium was 135.  -CT head did not reveal any acute intracranial abnormality.  Discharge Exam: Blood pressure (!) 157/105, pulse 98, temperature 97.8 F (36.6 C), temperature source Oral, resp. rate 18, height 5' 11 (1.803 m), weight 83 kg, SpO2 94%.   Disposition: Discharge disposition: 01-Home or Self Care       Discharge Instructions     Ambulatory Referral for Lung Cancer Scre   Complete by: As directed    Ambulatory referral to Dentistry   Complete by: As directed    [facility] oral surgery ([address], [place], [place] ).   Ambulatory referral to Physical Therapy   Complete by: As directed  [facility]-evaluation and treatment.   Diet - low sodium heart healthy   Complete by: As directed    Discharge wound care:   Complete by: As directed    Continue current wound care plan    Increase activity slowly   Complete by: As directed       Allergies as of 12/06/2023       Reactions   Celexa [citalopram Hydrobromide] Other (See Comments)   Didn't like the way it made him feel (high).   Norvasc  [amlodipine ] Swelling   Significant leg swelling        Medication List     STOP taking these medications    acetaminophen  500 MG tablet Commonly known as: TYLENOL    BENADRYL  PO   Bisoprolol  Fumarate 2.5 MG Tabs   cetirizine 10 MG tablet Commonly known as: ZYRTEC   furosemide  40 MG tablet Commonly known as: LASIX    ibuprofen 400 MG tablet Commonly known as: ADVIL   Melatonin 10 MG Tabs   varenicline 1 MG tablet Commonly known as: CHANTIX       TAKE these medications    albuterol  108 (90 Base) MCG/ACT inhaler Commonly known as: VENTOLIN  HFA Inhale 2 puffs into the lungs every 4 (four) hours as needed for wheezing or shortness of breath.   amoxicillin-clavulanate 875-125 MG tablet Commonly known as: AUGMENTIN Take 1 tablet by mouth 2 (two) times daily for 5 days.   carvedilol 3.125 MG tablet Commonly known as: Coreg Take 1 tablet (3.125 mg total) by mouth 2 (two) times daily.   docusate sodium  100 MG capsule Commonly known as: COLACE Take 1 capsule (100 mg total) by mouth 2 (two) times daily as needed for mild constipation.   ferrous sulfate  324 MG Tbec Take 324 mg by mouth. Tries taking 1 tab daily. What changed:  when to take this additional instructions   folic acid  1 MG tablet Commonly known as: FOLVITE  Take 1 tablet (1 mg total) by mouth daily. Start taking on: December 07, 2023   Gerhardt's butt cream Crea Apply 1 Application topically 2 (two) times daily.   HYDROcodone-acetaminophen  5-325 MG tablet Commonly known as: NORCO/VICODIN Take 1 tablet by mouth every 4 (four) hours as needed for moderate pain (pain score 4-6).   losartan 100 MG tablet Commonly known as: COZAAR Take 1 tablet (100 mg total) by mouth daily.    Magnesium  250 MG Tabs Take 1 tablet by mouth daily.   montelukast 10 MG tablet Commonly known as: SINGULAIR Take 10 mg by mouth at bedtime.   mouth rinse Liqd solution 15 mLs by Mouth Rinse route as needed (for oral care).   multivitamin with minerals Tabs tablet Take 1 tablet by mouth daily. Start taking on: December 07, 2023   nicotine  21 mg/24hr patch Commonly known as: NICODERM CQ  - dosed in mg/24 hours Place 1 patch (21 mg total) onto the skin daily. Start taking on: December 07, 2023   pantoprazole  40 MG tablet Commonly known as: PROTONIX  Take 40 mg by mouth daily.   polyethylene glycol 17 g packet Commonly known as: MIRALAX  / GLYCOLAX  Take 17 g by mouth daily as needed for moderate constipation.   rosuvastatin  5 MG tablet Commonly known as: CRESTOR  Take 1 tablet (5 mg total) by mouth daily.   sodium chloride  1 g tablet Take 1 tablet (1 g total) by mouth 2 (two) times daily with a meal.   thiamine  100 MG tablet Commonly known as: Vitamin B-1 Take  1 tablet (100 mg total) by mouth daily. Start taking on: December 07, 2023   umeclidinium-vilanterol 62.5-25 MCG/ACT Aepb Commonly known as: ANORO ELLIPTA Inhale 1 puff into the lungs daily. Start taking on: December 07, 2023               Durable Medical Equipment  (From admission, onward)           Start     Ordered   12/06/23 1454  For home use only DME Walker rolling  Once       Question Answer Comment  Walker: With 5 Inch Wheels   Patient needs a walker to treat with the following condition General weakness      12/06/23 1453              Discharge Care Instructions  (From admission, onward)           Start     Ordered   12/06/23 0000  Discharge wound care:       Comments: Continue current wound care plan   12/06/23 1157            Contact information for follow-up providers     Arloa Elsie SAUNDERS, MD Follow up in 1 week(s).   Specialty: Family Medicine Contact  information: [phone] [place] [address] [phone]         [facility] Follow up.   Why: Rolling Walker to be delivered to the home. Contact information: [address] [place], [place] [address]. Directions ; [phone].             Contact information for after-discharge care     Durable Medical Equipment     [facility] (DME) .   Service: Durable Medical Equipment Contact information: [address] [place]  [address] [phone]                    Time spent: 35 minutes.  SignedBETHA Leatrice LILLETTE Rosario 12/06/2023, 4:17 PM

## 2023-12-06 NOTE — Progress Notes (Signed)
 Vascular US  of right arm preliminary result came back positive for superficial vein thrombus. Informed patient's wife Lonell of the result. Instruction given to hot/cold pack as needed per Dr. Donn recommendation.  [name], RN

## 2023-12-15 DIAGNOSIS — E871 Hypo-osmolality and hyponatremia: Secondary | ICD-10-CM | POA: Diagnosis not present

## 2023-12-15 DIAGNOSIS — E7800 Pure hypercholesterolemia, unspecified: Secondary | ICD-10-CM | POA: Diagnosis not present

## 2023-12-15 DIAGNOSIS — J301 Allergic rhinitis due to pollen: Secondary | ICD-10-CM | POA: Diagnosis not present

## 2023-12-15 DIAGNOSIS — F17200 Nicotine dependence, unspecified, uncomplicated: Secondary | ICD-10-CM | POA: Diagnosis not present

## 2023-12-15 DIAGNOSIS — J42 Unspecified chronic bronchitis: Secondary | ICD-10-CM | POA: Diagnosis not present

## 2023-12-15 DIAGNOSIS — K269 Duodenal ulcer, unspecified as acute or chronic, without hemorrhage or perforation: Secondary | ICD-10-CM | POA: Diagnosis not present

## 2023-12-15 DIAGNOSIS — L309 Dermatitis, unspecified: Secondary | ICD-10-CM | POA: Diagnosis not present

## 2023-12-15 DIAGNOSIS — I10 Essential (primary) hypertension: Secondary | ICD-10-CM | POA: Diagnosis not present

## 2023-12-15 DIAGNOSIS — D649 Anemia, unspecified: Secondary | ICD-10-CM | POA: Diagnosis not present

## 2023-12-30 ENCOUNTER — Telehealth: Payer: Self-pay | Admitting: Acute Care

## 2023-12-30 DIAGNOSIS — Z87891 Personal history of nicotine dependence: Secondary | ICD-10-CM

## 2023-12-30 DIAGNOSIS — Z122 Encounter for screening for malignant neoplasm of respiratory organs: Secondary | ICD-10-CM

## 2023-12-30 DIAGNOSIS — F17210 Nicotine dependence, cigarettes, uncomplicated: Secondary | ICD-10-CM

## 2023-12-30 NOTE — Telephone Encounter (Signed)
 Lung Cancer Screening Narrative/Criteria Questionnaire (Cigarette Smokers Only- No Cigars/Pipes/vapes)   Gorje Iyer   SDMV:01/12/24 at 1030a/[place]                                           1970-01-31               LDCT: 01/13/24 at 0900a/[address]    53 y.o.   Phone: [phone]  Lung Screening Narrative (confirm age 18-77 yrs Medicare / 50-80 yrs Private pay insurance)   Insurance information:Medicaid   Referring Provider:hospital provider   This screening involves an initial phone call with a team member from our program. It is called a shared decision making visit. The initial meeting is required by insurance and Medicare to make sure you understand the program. This appointment takes about 15-20 minutes to complete. The CT scan will completed at a separate date/time. This scan takes about 5-10 minutes to complete and you may eat and drink before and after the scan.  Criteria questions for Lung Cancer Screening:   Are you a current or former smoker? Current Age began smoking: 15y   If you are a former smoker, what year did you quit smoking? NA   To calculate your smoking history, I need an accurate estimate of how many packs of cigarettes you smoked per day and for how many years. (Not just the number of PPD you are now smoking)   Years smoking 38 x Packs per day 1 to 1.5 = Pack years 48 *currently cut back/on chantix   (at least 20 pack yrs)   (Make sure they understand that we need to know how much they have smoked in the past, not just the number of PPD they are smoking now)  Do you have a personal history of cancer?  No    Do you have a family history of cancer? Yes  (cancer type and and relative) mother/lung and father/?kidney or related to kidney  Are you coughing up blood?  No  Have you had unexplained weight loss of 15 lbs or more in the last 6 months? No  It looks like you meet all criteria.     Additional information: N/A

## 2024-01-10 DIAGNOSIS — R531 Weakness: Secondary | ICD-10-CM | POA: Diagnosis not present

## 2024-01-10 DIAGNOSIS — R2681 Unsteadiness on feet: Secondary | ICD-10-CM | POA: Diagnosis not present

## 2024-01-10 DIAGNOSIS — R293 Abnormal posture: Secondary | ICD-10-CM | POA: Diagnosis not present

## 2024-01-12 ENCOUNTER — Ambulatory Visit: Admitting: Adult Health

## 2024-01-12 ENCOUNTER — Encounter: Payer: Self-pay | Admitting: Adult Health

## 2024-01-12 DIAGNOSIS — F17210 Nicotine dependence, cigarettes, uncomplicated: Secondary | ICD-10-CM

## 2024-01-12 DIAGNOSIS — E871 Hypo-osmolality and hyponatremia: Secondary | ICD-10-CM | POA: Diagnosis not present

## 2024-01-12 NOTE — Progress Notes (Signed)
°  Virtual Visit via Telephone Note  I connected with Allen Wood , 01/12/2024 10:31 AM by a telemedicine application and verified that I am speaking with the correct person using two identifiers.  Location: Patient: home Provider: home   I discussed the limitations of evaluation and management by telemedicine and the availability of in person appointments. The patient expressed understanding and agreed to proceed.   Shared Decision Making Visit Lung Cancer Screening Program [phone])   Eligibility: 53 y.o. Pack Years Smoking History Calculation = 48 pack years (# packs/per year x # years smoked) Recent History of coughing up blood  no Unexplained weight loss? no ( >Than 15 pounds within the last 6 months ) Prior History Lung / other cancer no (Diagnosis within the last 5 years already requiring surveillance chest CT Scans). Smoking Status Current Smoker  Visit Components: Discussion included one or more decision making aids. YES Discussion included risk/benefits of screening. YES Discussion included potential follow up diagnostic testing for abnormal scans. YES Discussion included meaning and risk of over diagnosis. YES Discussion included meaning and risk of False Positives. YES Discussion included meaning of total radiation exposure. YES  Counseling Included: Importance of adherence to annual lung cancer LDCT screening. YES Impact of comorbidities on ability to participate in the program. YES Ability and willingness to under diagnostic treatment. YES  Smoking Cessation Counseling: Current Smokers:  Discussed importance of smoking cessation. yes Information about tobacco cessation classes and interventions provided to patient. yes Patient provided with ticket for LDCT Scan. yes Symptomatic Patient. NO Diagnosis Code: Tobacco Use Z72.0 Asymptomatic Patient yes  Counseling - 4 minutes of smoking cessation counseling  Smoking/Tobacco Cessation Counseling Allen Wood is a current user of tobacco or nicotine  products. He is ready to quit at this time. Counseling provided today addressed the risks of continued use and the benefits of cessation. Discussed tobacco/nicotine  use history, readiness to quit, and evidence-based treatment options including behavioral strategies, support resources, and pharmacologic therapies. Provided encouragement and educational materials on steps and resources to quit smoking. Patient questions were addressed, and follow-up recommended for continued support. Total time spent on counseling: 4 minutes.    Z12.2-Screening of respiratory organs Z87.891-Personal history of nicotine  dependence   Allen Wood 01/12/2024

## 2024-01-12 NOTE — Patient Instructions (Signed)

## 2024-01-13 ENCOUNTER — Inpatient Hospital Stay: Admission: RE | Admit: 2024-01-13 | Discharge: 2024-01-13

## 2024-01-13 DIAGNOSIS — F17210 Nicotine dependence, cigarettes, uncomplicated: Secondary | ICD-10-CM | POA: Diagnosis not present

## 2024-01-13 DIAGNOSIS — Z122 Encounter for screening for malignant neoplasm of respiratory organs: Secondary | ICD-10-CM

## 2024-01-13 DIAGNOSIS — Z87891 Personal history of nicotine dependence: Secondary | ICD-10-CM

## 2024-01-24 ENCOUNTER — Other Ambulatory Visit: Payer: Self-pay | Admitting: Acute Care

## 2024-01-24 DIAGNOSIS — Z122 Encounter for screening for malignant neoplasm of respiratory organs: Secondary | ICD-10-CM

## 2024-01-24 DIAGNOSIS — Z87891 Personal history of nicotine dependence: Secondary | ICD-10-CM

## 2024-01-24 DIAGNOSIS — F17210 Nicotine dependence, cigarettes, uncomplicated: Secondary | ICD-10-CM

## 2024-02-03 ENCOUNTER — Ambulatory Visit: Payer: Self-pay | Admitting: Internal Medicine

## 2024-02-03 ENCOUNTER — Ambulatory Visit: Admitting: Internal Medicine

## 2024-02-03 ENCOUNTER — Encounter: Payer: Self-pay | Admitting: Internal Medicine

## 2024-02-03 VITALS — BP 132/78 | HR 97 | Temp 98.1°F | Ht 71.0 in | Wt 174.4 lb

## 2024-02-03 DIAGNOSIS — F17210 Nicotine dependence, cigarettes, uncomplicated: Secondary | ICD-10-CM | POA: Diagnosis not present

## 2024-02-03 DIAGNOSIS — J449 Chronic obstructive pulmonary disease, unspecified: Secondary | ICD-10-CM | POA: Diagnosis not present

## 2024-02-03 LAB — CBC WITH DIFFERENTIAL/PLATELET
Basophils Absolute: 0 K/uL (ref 0.0–0.1)
Basophils Relative: 0.5 % (ref 0.0–3.0)
Eosinophils Absolute: 0.2 K/uL (ref 0.0–0.7)
Eosinophils Relative: 3.5 % (ref 0.0–5.0)
HCT: 34.1 % — ABNORMAL LOW (ref 39.0–52.0)
Hemoglobin: 11.8 g/dL — ABNORMAL LOW (ref 13.0–17.0)
Lymphocytes Relative: 23.6 % (ref 12.0–46.0)
Lymphs Abs: 1.3 K/uL (ref 0.7–4.0)
MCHC: 34.7 g/dL (ref 30.0–36.0)
MCV: 95.3 fl (ref 78.0–100.0)
Monocytes Absolute: 0.9 K/uL (ref 0.1–1.0)
Monocytes Relative: 15.4 % — ABNORMAL HIGH (ref 3.0–12.0)
Neutro Abs: 3.2 K/uL (ref 1.4–7.7)
Neutrophils Relative %: 57 % (ref 43.0–77.0)
Platelets: 260 K/uL (ref 150.0–400.0)
RBC: 3.58 Mil/uL — ABNORMAL LOW (ref 4.22–5.81)
RDW: 14.8 % (ref 11.5–15.5)
WBC: 5.6 K/uL (ref 4.0–10.5)

## 2024-02-03 LAB — BASIC METABOLIC PANEL WITH GFR
BUN: 11 mg/dL (ref 6–23)
CO2: 29 meq/L (ref 19–32)
Calcium: 8.5 mg/dL (ref 8.4–10.5)
Chloride: 88 meq/L — ABNORMAL LOW (ref 96–112)
Creatinine, Ser: 0.71 mg/dL (ref 0.40–1.50)
GFR: 103.05 mL/min
Glucose, Bld: 68 mg/dL — ABNORMAL LOW (ref 70–99)
Potassium: 4.7 meq/L (ref 3.5–5.1)
Sodium: 122 meq/L — ABNORMAL LOW (ref 135–145)

## 2024-02-03 MED ORDER — BUDESONIDE-FORMOTEROL FUMARATE 80-4.5 MCG/ACT IN AERO: INHALATION_SPRAY | RESPIRATORY_TRACT | 12 refills | Status: DC

## 2024-02-03 MED ORDER — BREZTRI AEROSPHERE 160-9-4.8 MCG/ACT IN AERO: 2.0000 | INHALATION_SPRAY | Freq: Two times a day (BID) | RESPIRATORY_TRACT | Status: DC

## 2024-02-03 NOTE — Assessment & Plan Note (Addendum)
 Active smoker with clinical  AB   >  COPD  with ? Intol to lama (dry mouth/ cough and high dose ics (candidiasis)  - PFT's  03/02/14  FEV1 2.73 (65 % ) ratio 0.59  p 19 % improvement from saba p 0 prior to study with DLCO  21.8 (64%)   and FV curve mildly concave  - Allergy screen 02/03/2024 >  Eos 0. /  Alpha one AT phenotype pending  - 02/03/2024   Walked on RA  x  3  lap(s) =  approx 750  ft  @ brisk pace, stopped due to end of study  with lowest 02 sats 94% but improved to 96% before stopping at end   - 02/03/2024  After extensive coaching inhaler device,  effectiveness =    75% from a baseline on 50% > try symbicort  80 2bid (since high dose resulted in oral candidiasis) and more approp saba:  Re SABA :  I spent extra time with pt today reviewing appropriate use of albuterol  for prn use on exertion with the following points: 1) saba is for relief of sob that does not improve by walking a slower pace or resting but rather if the pt does not improve after trying this first. 2) If the pt is convinced, as many are, that saba helps recover from activity faster then it's easy to tell if this is the case by re-challenging : ie stop, take the inhaler, then p 5 minutes try the exact same activity (intensity of workload) that just caused the symptoms and see if they are substantially diminished or not after saba 3) if there is an activity that reproducibly causes the symptoms, try the saba 15 min before the activity on alternate days   If in fact the saba really does help, then fine to continue to use it prn but advised may need to look closer at the maintenance regimen (symbicort  80 for now)  being used to achieve better control of airways disease with exertion.

## 2024-02-03 NOTE — Telephone Encounter (Signed)
 I called and spoke with [facility] regarding critical lab, sodium=119.  She states she already spoke with someone regarding this lab, however while I was on the phone, she re ran it and it was 122.

## 2024-02-03 NOTE — Telephone Encounter (Signed)
 Dr. Darlean, Please see message regarding critical lab.  Lab was re run.

## 2024-02-03 NOTE — Patient Instructions (Addendum)
 Plan A = Automatic = Always=    Symbicort  80 Take 2 puffs first thing in am and then another 2 puffs about 12 hours later.    Work on inhaler technique:  relax and gently blow all the way out then take a nice smooth full deep breath back in, triggering the inhaler at same time you start breathing in.  Hold breath in for at least  5 seconds if you can. Blow out symbicort  80  thru nose. Rinse and gargle with water  when done.  If mouth or throat bother you at all,  try brushing teeth/gums/tongue with arm and hammer toothpaste/ make a slurry and gargle and spit out.   >>>  Remember how golfers warm up by taking practice swings - do this with an empty inhaler   Plan B = Backup (to supplement plan A, not to replace it) Use your albuterol  inhaler as a rescue medication to be used if you can't catch your breath by resting or slowing your pace  or doing a relaxed purse lip breathing pattern.  - The less you use it, the better it will work when you need it. - Ok to use the inhaler up to 2 puffs  every 4 hours if you must but call for appointment if use goes up over your usual need - Don't leave home without it !!  (think of it like the spare tire or starter fluid for your car)     Also  Ok to try albuterol  15 min before an activity (on alternating days)  that you know would usually make you short of breath and see if it makes any difference and if makes none then don't take albuterol  after activity unless you can't catch your breath as this means it's the resting that helps, not the albuterol .   Please remember to go to the lab department   for your tests - we will call you with the results when they are available.       Please schedule a follow up office visit in 4- 6 weeks, call sooner if needed with pfts

## 2024-02-03 NOTE — Assessment & Plan Note (Addendum)
 Counseled re importance of smoking cessation but did not meet time criteria for separate billing    Each maintenance medication was reviewed in detail including emphasizing most importantly the difference between maintenance and prns and under what circumstances the prns are to be triggered using an action plan format where appropriate.  Total time for H and P, chart review, counseling, reviewing hfa  device(s) , directly observing portions of ambulatory 02 saturation study/ and generating customized AVS unique to this office visit / same day charting = 48 min new pt eval

## 2024-02-03 NOTE — Progress Notes (Signed)
 "  Allen Wood, male    DOB: [birth year]   MRN: [id number]   Brief patient profile:  54  yowm active smoker  [name]/ [name] pt referred to pulmonary clinic 02/03/2024 by Dr Arloa for copd with doe        Physician Discharge Summary  Patient ID: Allen Wood MRN: [id number] DOB/AGE: 08/18/1970 54 y.o.   Admit date: 11/27/2023 Discharge date: 12/06/2023   Admission Diagnoses:   Discharge Diagnoses:  Principal Problem:   Hyponatremia     Discharged Condition: stable   Hospital Course: Patient is a 54 year old male with past medical history significant for alcohol abuse, seizures due to ETOH withdrawal in 2016-2017, HTN, HLD, anemia-IDA, COPD, tobacco abuse, anxiety and depression.  Patient drinks bourbon daily.  Patient presented with 1 week history of dizziness, acute encephalopathy and severe hyponatremia (sodium of 102).  CT head was negative.  Patient was initially admitted to ICU team.  Patient was initially managed with 3% saline.  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  Patient was also managed for alcohol withdrawal.  As the sodium level improved, patient was transferred to the hospitalist team.  Patient is on sodium chloride  tablet.  Sodium today is 135.  During the hospital stay, patient was noted to have left facial swelling.  There were concerns for dental caries.  Patient was managed with IV Unasyn .  Patient be discharged on oral Augmentin .  Patient will follow-up with primary care provider and dentist within 1 week of discharge.  Patient and patient's wife prefer to be referred to [facility] oral surgery ([address] [place], [place], [place] ).   Symptomatic hyponatremia: Acute encephalopathy due to hyponatremia/alcohol abuse: -Sodium on presentation was 102. - CT head negative. - Initially managed with 3% saline. 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 - Patient was managed with sodium salt 2 g p.o.  3 times daily. - Patient will be discharged on sodium chloride  tablet 2 g twice daily. - Sodium prior to discharge was 135. - Continue to monitor renal function and electrolytes. - Patient has been counseled to quit alcohol use.     Left facial swelling, dental origin:  -No abscess noted on gross oral cavity exam, multiple tooth loss noted, dental caries noted. -IV Unasyn  started 12/03/2023, with improvement noted.   - Patient be discharged back home on Augmentin . - Patient will follow-up with dentist within 1 week of discharge.   Hypomagnesemia Monitored and replaced.    Normocytic anemia: -Continue to monitor. - Further workup as per PCP   Alcohol abuse concern for withdrawals history of seizures in setting of withdrawals: -Patient was managed with phenobarbital  taper, CIWA and multivitamins high-dose thiamine , -counseled to quit alcohol use.     Likely COPD Tobacco abuse: Continue Anoro needs outpatient PFTs.  Counseling for tobacco cessation.   Hypertension: Stable continue home losartan  12/05/2023: Continue to monitor closely.  Resume losartan .   Hyperlipidemia: On Crestor     LDSCT  12/18//25  RADS 2  Centrilobular and paraseptal emphysema. Smoking related respiratory bronchiolitis    History of Present Illness  02/03/2024  Pulmonary/ 1st office eval/Tamel Abel Anoro  Chief Complaint  Patient presents with   [facility] follow up.  Dx COPD, SOB with walking.  Dyspnea:  limited by back pain radiating to L leg more than doe Cough: some better since admit / mostly dry as his mouth on LAMA per DPI/elipta device (worse since started anoro)  Sleep:  bed is flat and 4 pillows x years SABA use: 2 x daily  02 ldz:wnwz     No obvious day to day or daytime pattern/variability or assoc excess/ purulent sputum or mucus plugs or hemoptysis or cp or chest tightness, subjective wheeze or overt hb symptoms.    Also denies any obvious fluctuation of symptoms with weather or  environmental changes or other aggravating or alleviating factors except as outlined above   No unusual exposure hx or h/o childhood pna/ asthma or knowledge of premature birth.  Current Allergies, Complete Past Medical History, Past Surgical History, Family History, and Social History were reviewed in [employer] record.  ROS  The following are not active complaints unless bolded Hoarseness, sore throat, dysphagia, dental problems, itching, sneezing,  nasal congestion  in AM or discharge of excess mucus or purulent secretions, ear ache,   fever, chills, sweats, unintended wt loss or wt gain, classically pleuritic or exertional cp,  orthopnea pnd or arm/hand swelling  or leg swelling, presyncope, palpitations, abdominal pain, anorexia, nausea, vomiting, diarrhea  or change in bowel habits or change in bladder habits, change in stools or change in urine, dysuria, hematuria,  rash, arthralgias, visual complaints, headache, numbness, weakness or ataxia or problems with walking or coordination,  change in mood or  memory.             Outpatient Medications Prior to Visit  Medication Sig Dispense Refill   albuterol  (PROVENTIL  HFA;VENTOLIN  HFA) 108 (90 Base) MCG/ACT inhaler Inhale 2 puffs into the lungs every 4 (four) hours as needed for wheezing or shortness of breath.     carvedilol  (COREG ) 6.25 MG tablet Take 6.25 mg by mouth 2 (two) times daily with a meal.     docusate sodium  (COLACE) 100 MG capsule Take 1 capsule (100 mg total) by mouth 2 (two) times daily as needed for mild constipation. 10 capsule 0   ferrous sulfate  324 MG TBEC Take 324 mg by mouth. Tries taking 1 tab daily. (Patient taking differently: Take 324 mg by mouth every Monday, Wednesday, and Friday at 8 PM.)     folic acid  (FOLVITE ) 1 MG tablet Take 1 tablet (1 mg total) by mouth daily. 30 tablet 1   HYDROcodone -acetaminophen  (NORCO/VICODIN) 5-325 MG tablet Take 1 tablet by mouth every 4 (four) hours as needed  for moderate pain (pain score 4-6). 30 tablet 0   losartan  (COZAAR ) 100 MG tablet Take 1 tablet (100 mg total) by mouth daily. 30 tablet 1   Magnesium  250 MG TABS Take 1 tablet by mouth daily.     montelukast  (SINGULAIR ) 10 MG tablet Take 10 mg by mouth at bedtime.     Mouthwashes (MOUTH RINSE) LIQD solution 15 mLs by Mouth Rinse route as needed (for oral care). 473 mL 0   Multiple Vitamin (MULTIVITAMIN WITH MINERALS) TABS tablet Take 1 tablet by mouth daily. 30 tablet 1   nicotine  (NICODERM CQ  - DOSED IN MG/24 HOURS) 21 mg/24hr patch Place 1 patch (21 mg total) onto the skin daily. 28 patch 1   Nystatin (GERHARDT'S BUTT CREAM) CREA Apply 1 Application topically 2 (two) times daily. 60 each 0   pantoprazole  (PROTONIX ) 40 MG tablet Take 40 mg by mouth daily.     polyethylene glycol powder (GLYCOLAX /MIRALAX ) 17 GM/SCOOP powder Take 17 g by mouth daily as needed for moderate constipation. Dissolve 1 capful (17g) in 4-8 ounces of liquid and take by mouth daily. 238 g 0   rosuvastatin  (CRESTOR ) 5  MG tablet Take 1 tablet (5 mg total) by mouth daily. 30 tablet 1   thiamine  (VITAMIN B1) 100 MG tablet Take 1 tablet (100 mg total) by mouth daily. 30 tablet 2   umeclidinium-vilanterol (ANORO ELLIPTA ) 62.5-25 MCG/ACT AEPB Inhale 1 puff into the lungs daily. 60 each 1   carvedilol  (COREG ) 3.125 MG tablet Take 1 tablet (3.125 mg total) by mouth 2 (two) times daily. 60 tablet 1   No facility-administered medications prior to visit.    Past Medical History:  Diagnosis Date   Alcohol abuse    Anemia    Anxiety    Aortic atherosclerosis    Chronic bronchitis (HCC)    COPD with emphysema (HCC)    Delirium tremens (HCC)    Depression    Elevated blood pressure reading without diagnosis of hypertension    Elevated LFTs    Emphysema, unspecified (HCC)    History of COVID-19    Hyponatremia    Insomnia    Pneumonia 2016   septic shock, trach/PEG/chest tube, both resolved   Seasonal allergic rhinitis     Seizures (HCC)    while intubated with PNA in 2016; post-ETOH in 2017   Tobacco dependence       Objective:     BP 132/78   Pulse 97   Temp 98.1 F (36.7 C) (Temporal)   Ht 5' 11 (1.803 m)   Wt 174 lb 6.4 oz (79.1 kg)   SpO2 95% Comment: room air  BMI 24.32 kg/m   SpO2: 95 % (room air)  pleasaant amb wm nad   HEENT : Oropharynx  nl   Nasal turbinates nl    NECK :  without  apparent JVD/ palpable Nodes/TM    LUNGS: no acc muscle use,  Min barrel  contour chest wall with bilateral  slightly decreased bs s audible wheeze and  without cough on insp or exp maneuvers and min  Hyperresonant  to  percussion bilaterally    CV:  RRR  no s3 or murmur or increase in P2, and no edema   ABD:  soft and nontender    MS:  Nl gait/ ext warm without deformities Or obvious joint restrictions  calf tenderness, cyanosis or clubbing     SKIN: warm and dry without lesions    NEURO:  alert, approp, nl sensorium with  no motor or cerebellar deficits apparent.            Assessment   Assessment & Plan COPD mixed type [facility]) Active smoker with clinical  AB   >  COPD  with ? Intol to lama (dry mouth/ cough and high dose ics (candidiasis)  - PFT's  03/02/14  FEV1 2.73 (65 % ) ratio 0.59  p 19 % improvement from saba p 0 prior to study with DLCO  21.8 (64%)   and FV curve mildly concave  - Allergy screen 02/03/2024 >  Eos 0. /  Alpha one AT phenotype pending  - 02/03/2024   Walked on RA  x  3  lap(s) =  approx 750  ft  @ brisk pace, stopped due to end of study  with lowest 02 sats 94% but improved to 96% before stopping at end   - 02/03/2024  After extensive coaching inhaler device,  effectiveness =    75% from a baseline on 50% > try symbicort  80 2bid (since high dose resulted in oral candidiasis) and more approp saba:  Re SABA :  I spent extra time  with pt today reviewing appropriate use of albuterol  for prn use on exertion with the following points: 1) saba is for relief of sob that does not  improve by walking a slower pace or resting but rather if the pt does not improve after trying this first. 2) If the pt is convinced, as many are, that saba helps recover from activity faster then it's easy to tell if this is the case by re-challenging : ie stop, take the inhaler, then p 5 minutes try the exact same activity (intensity of workload) that just caused the symptoms and see if they are substantially diminished or not after saba 3) if there is an activity that reproducibly causes the symptoms, try the saba 15 min before the activity on alternate days   If in fact the saba really does help, then fine to continue to use it prn but advised may need to look closer at the maintenance regimen (symbicort  80 for now)  being used to achieve better control of airways disease with exertion.    Cigarette smoker Counseled re importance of smoking cessation but did not meet time criteria for separate billing    Each maintenance medication was reviewed in detail including emphasizing most importantly the difference between maintenance and prns and under what circumstances the prns are to be triggered using an action plan format where appropriate.  Total time for H and P, chart review, counseling, reviewing hfa  device(s) , directly observing portions of ambulatory 02 saturation study/ and generating customized AVS unique to this office visit / same day charting = 48 min new pt eval                  AVS  Patient Instructions  Plan A = Automatic = Always=    Symbicort  80 Take 2 puffs first thing in am and then another 2 puffs about 12 hours later.    Work on inhaler technique:  relax and gently blow all the way out then take a nice smooth full deep breath back in, triggering the inhaler at same time you start breathing in.  Hold breath in for at least  5 seconds if you can. Blow out symbicort  80  thru nose. Rinse and gargle with water  when done.  If mouth or throat bother you at all,  try brushing  teeth/gums/tongue with arm and hammer toothpaste/ make a slurry and gargle and spit out.   >>>  Remember how golfers warm up by taking practice swings - do this with an empty inhaler   Plan B = Backup (to supplement plan A, not to replace it) Use your albuterol  inhaler as a rescue medication to be used if you can't catch your breath by resting or slowing your pace  or doing a relaxed purse lip breathing pattern.  - The less you use it, the better it will work when you need it. - Ok to use the inhaler up to 2 puffs  every 4 hours if you must but call for appointment if use goes up over your usual need - Don't leave home without it !!  (think of it like the spare tire or starter fluid for your car)     Also  Ok to try albuterol  15 min before an activity (on alternating days)  that you know would usually make you short of breath and see if it makes any difference and if makes none then don't take albuterol  after activity unless you can't catch your breath as  this means it's the resting that helps, not the albuterol .   Please remember to go to the lab department   for your tests - we will call you with the results when they are available.       Please schedule a follow up office visit in 4- 6 weeks, call sooner if needed with pfts          Ishika Chesterfield, MD 02/03/2024     "

## 2024-02-03 NOTE — Telephone Encounter (Signed)
 Copied from CRM [id number]. Topic: Clinical - Lab/Test Results >> Feb 03, 2024 12:07 PM Rozanna MATSU wrote: Reason for CRM: Critical lab for Dr Darlean, called [facility] several times no answer, calling nurse triage Answer Assessment - Initial Assessment Questions 1. REASON FOR CALL or QUESTION: What is your reason for calling today? or How can I best       Critical Lab: NA 119 and then re-ran the test 122.    2. CALLER: Document the source of call. (e.g., laboratory staff, caregiver or patient).     [place] with the lab  Protocols used: PCP Call - No Triage-A-AH

## 2024-02-04 NOTE — Telephone Encounter (Signed)
 From result note:  Call patient :  Studies are ok x sodium back down as bad  as before so suggest   Drink fluids only at meals and very minimum tolerated  Use hard rock candy to keep mouth from being dry  Recheck levels in a week with dr Arloa - if any change in mental status go to ER      ATC x1.  No answer.  VM full.  No access to mychart.  Will attempt call another time.

## 2024-02-04 NOTE — Progress Notes (Signed)
 ATC x1.  No answer.  VM full.  Patient does not have mychart access.  Will try again another time.

## 2024-02-07 ENCOUNTER — Other Ambulatory Visit: Payer: Self-pay | Admitting: Family Medicine

## 2024-02-07 DIAGNOSIS — K3189 Other diseases of stomach and duodenum: Secondary | ICD-10-CM

## 2024-02-07 LAB — ALPHA-1-ANTITRYPSIN PHENOTYP: A-1 Antitrypsin: 104 mg/dL (ref 101–187)

## 2024-02-07 NOTE — Telephone Encounter (Signed)
 I called and spoke with patient, provided results/recommendations per Dr. Darlean.  He verbalized understanding.  He said he has a f/u with Dr. Arloa in March, I advised him to follow up with lab work to recheck his sodium level prior to March.  He verbalized understanding.  Nothing further needed.

## 2024-02-09 ENCOUNTER — Other Ambulatory Visit: Payer: Self-pay | Admitting: Nurse Practitioner

## 2024-02-09 NOTE — Telephone Encounter (Signed)
 In accordance with refill protocols, please review and address the following requirements before this medication refill can be authorized:  Labs

## 2024-02-10 NOTE — Progress Notes (Signed)
 Atc x2 vm full

## 2024-02-18 ENCOUNTER — Encounter: Payer: Self-pay | Admitting: Family Medicine

## 2024-02-23 ENCOUNTER — Ambulatory Visit
Admission: RE | Admit: 2024-02-23 | Discharge: 2024-02-23 | Disposition: A | Discharge status: Home / Self Care | Source: Ambulatory Visit | Attending: Family Medicine | Admitting: Family Medicine

## 2024-02-23 DIAGNOSIS — K3189 Other diseases of stomach and duodenum: Secondary | ICD-10-CM

## 2024-02-23 MED ORDER — IOPAMIDOL (ISOVUE-300) INJECTION 61%
100.0000 mL | Freq: Once | INTRAVENOUS | Status: AC | PRN
  Administered 2024-02-23: 100 mL via INTRAVENOUS

## 2024-02-29 ENCOUNTER — Other Ambulatory Visit (HOSPITAL_BASED_OUTPATIENT_CLINIC_OR_DEPARTMENT_OTHER): Payer: Self-pay

## 2024-03-01 ENCOUNTER — Ambulatory Visit (HOSPITAL_BASED_OUTPATIENT_CLINIC_OR_DEPARTMENT_OTHER): Admitting: Nurse Practitioner

## 2024-03-01 ENCOUNTER — Encounter (HOSPITAL_BASED_OUTPATIENT_CLINIC_OR_DEPARTMENT_OTHER): Payer: Self-pay | Admitting: Nurse Practitioner

## 2024-03-01 VITALS — BP 160/90 | HR 84 | Ht 71.0 in | Wt 176.2 lb

## 2024-03-01 DIAGNOSIS — F10288 Alcohol dependence with other alcohol-induced disorder: Secondary | ICD-10-CM

## 2024-03-01 DIAGNOSIS — R0609 Other forms of dyspnea: Secondary | ICD-10-CM

## 2024-03-01 DIAGNOSIS — E785 Hyperlipidemia, unspecified: Secondary | ICD-10-CM

## 2024-03-01 DIAGNOSIS — I2510 Atherosclerotic heart disease of native coronary artery without angina pectoris: Secondary | ICD-10-CM

## 2024-03-01 DIAGNOSIS — R600 Localized edema: Secondary | ICD-10-CM | POA: Diagnosis not present

## 2024-03-01 DIAGNOSIS — I700 Atherosclerosis of aorta: Secondary | ICD-10-CM

## 2024-03-01 DIAGNOSIS — E871 Hypo-osmolality and hyponatremia: Secondary | ICD-10-CM

## 2024-03-01 DIAGNOSIS — I10 Essential (primary) hypertension: Secondary | ICD-10-CM

## 2024-03-01 DIAGNOSIS — Z720 Tobacco use: Secondary | ICD-10-CM

## 2024-03-01 MED ORDER — CARVEDILOL 6.25 MG PO TABS: 6.2500 mg | ORAL_TABLET | Freq: Two times a day (BID) | ORAL | 3 refills | Status: AC

## 2024-03-01 MED ORDER — LOSARTAN POTASSIUM 100 MG PO TABS: 100.0000 mg | ORAL_TABLET | Freq: Every day | ORAL | 3 refills | Status: AC

## 2024-03-01 NOTE — Patient Instructions (Signed)
 Medication Instructions:   Your physician recommends that you continue on your current medications as directed. Please refer to the Current Medication list given to you today.   *If you need a refill on your cardiac medications before your next appointment, please call your pharmacy*  Lab Work:  None ordered.  If you have labs (blood work) drawn today and your tests are completely normal, you will receive your results only by: MyChart Message (if you have MyChart) OR A paper copy in the mail If you have any lab test that is abnormal or we need to change your treatment, we will call you to review the results.  Testing/Procedures:  Your physician has requested that you have an ankle brachial index (ABI). During this test an ultrasound and blood pressure cuff are used to evaluate the arteries that supply the arms and legs with blood. Allow thirty minutes for this exam. There are no restrictions or special instructions.  Please note: We ask at that you not bring children with you during ultrasound (echo/ vascular) testing. Due to room size and safety concerns, children are not allowed in the ultrasound rooms during exams. Our front office staff cannot provide observation of children in our lobby area while testing is being conducted. An adult accompanying a patient to their appointment will only be allowed in the ultrasound room at the discretion of the ultrasound technician under special circumstances. We apologize for any inconvenience.   Follow-Up: At [facility], you and your health needs are our priority.  As part of our continuing mission to provide you with exceptional heart care, our providers are all part of one team.  This team includes your primary Cardiologist (physician) and Advanced Practice Providers or APPs (Physician Assistants and Nurse Practitioners) who all work together to provide you with the care you need, when you need it.  Your next appointment:   6  month(s)  Provider:   Rosaline Bane, NP    We recommend signing up for the patient portal called MyChart.  Sign up information is provided on this After Visit Summary.  MyChart is used to connect with patients for Virtual Visits (Telemedicine).  Patients are able to view lab/test results, encounter notes, upcoming appointments, etc.  Non-urgent messages can be sent to your provider as well.   To learn more about what you can do with MyChart, go to [email or web address].   Other Instructions  Your physician wants you to follow-up in: 6 months.  You will receive a reminder letter in the mail two months in advance. If you don't receive a letter, please call our office to schedule the follow-up appointment.

## 2024-03-09 ENCOUNTER — Ambulatory Visit: Admitting: Internal Medicine

## 2024-03-16 ENCOUNTER — Encounter (HOSPITAL_BASED_OUTPATIENT_CLINIC_OR_DEPARTMENT_OTHER)

## 2024-05-25 ENCOUNTER — Ambulatory Visit (HOSPITAL_BASED_OUTPATIENT_CLINIC_OR_DEPARTMENT_OTHER): Admitting: Nurse Practitioner
